# Patient Record
Sex: Male | Born: 1950 | Race: White | Hispanic: No | Marital: Married | Smoking: Former smoker
Health system: Southern US, Community
[De-identification: ages and names within clinical notes are randomized; demographics above are authoritative.]

## PROBLEM LIST (undated history)

## (undated) DIAGNOSIS — C801 Malignant (primary) neoplasm, unspecified: Secondary | ICD-10-CM

## (undated) DIAGNOSIS — I10 Essential (primary) hypertension: Secondary | ICD-10-CM

## (undated) DIAGNOSIS — F419 Anxiety disorder, unspecified: Secondary | ICD-10-CM

## (undated) DIAGNOSIS — F32A Depression, unspecified: Secondary | ICD-10-CM

## (undated) DIAGNOSIS — F329 Major depressive disorder, single episode, unspecified: Secondary | ICD-10-CM

## (undated) DIAGNOSIS — N189 Chronic kidney disease, unspecified: Secondary | ICD-10-CM

## (undated) HISTORY — DX: Depression, unspecified: F32.A

## (undated) HISTORY — DX: Anxiety disorder, unspecified: F41.9

## (undated) HISTORY — DX: Malignant (primary) neoplasm, unspecified: C80.1

## (undated) HISTORY — DX: Chronic kidney disease, unspecified: N18.9

## (undated) HISTORY — PX: POLYPECTOMY: SHX149

## (undated) HISTORY — PX: BLADDER SURGERY: SHX569

## (undated) HISTORY — DX: Essential (primary) hypertension: I10

## (undated) HISTORY — DX: Major depressive disorder, single episode, unspecified: F32.9

## (undated) HISTORY — PX: COLONOSCOPY: SHX174

---

## 1997-06-30 ENCOUNTER — Emergency Department (HOSPITAL_COMMUNITY): Admission: EM | Admit: 1997-06-30 | Discharge: 1997-06-30 | Payer: Self-pay | Admitting: Emergency Medicine

## 2002-06-28 ENCOUNTER — Encounter: Payer: Self-pay | Admitting: Pulmonary Disease

## 2002-06-28 ENCOUNTER — Ambulatory Visit (HOSPITAL_COMMUNITY): Admission: RE | Admit: 2002-06-28 | Discharge: 2002-06-28 | Payer: Self-pay | Admitting: Pulmonary Disease

## 2005-04-02 ENCOUNTER — Ambulatory Visit (HOSPITAL_BASED_OUTPATIENT_CLINIC_OR_DEPARTMENT_OTHER): Admission: RE | Admit: 2005-04-02 | Discharge: 2005-04-02 | Payer: Self-pay | Admitting: Urology

## 2005-04-02 ENCOUNTER — Encounter (INDEPENDENT_AMBULATORY_CARE_PROVIDER_SITE_OTHER): Payer: Self-pay | Admitting: Specialist

## 2008-08-03 ENCOUNTER — Emergency Department (HOSPITAL_COMMUNITY): Admission: EM | Admit: 2008-08-03 | Discharge: 2008-08-03 | Payer: Self-pay | Admitting: Emergency Medicine

## 2008-08-16 ENCOUNTER — Encounter: Admission: RE | Admit: 2008-08-16 | Discharge: 2008-08-16 | Payer: Self-pay | Admitting: Internal Medicine

## 2010-06-30 LAB — POCT I-STAT, CHEM 8
BUN: 18 mg/dL (ref 6–23)
Creatinine, Ser: 1.6 mg/dL — ABNORMAL HIGH (ref 0.4–1.5)
Sodium: 143 mEq/L (ref 135–145)
TCO2: 27 mmol/L (ref 0–100)

## 2010-08-07 NOTE — Op Note (Signed)
NAME:  Christopher [name], Christopher [name] NO.:  [id number]   MEDICAL RECORD NO.:  [id number]          PATIENT TYPE:  AMB   LOCATION:  [facility]                         FACILITY:  [facility]   PHYSICIAN:  Ronald L. Rosana Hoes, M.D.  DATE OF BIRTH:  [birth year]   DATE OF PROCEDURE:  04/02/2005  DATE OF DISCHARGE:                                 OPERATIVE REPORT   DIAGNOSIS:  Possible bladder tumor.   OPERATIVE PROCEDURE:  Cystourethroscopy, cold cup removal of bladder lesion.   SURGEON:  Duane Lope. Rosana Hoes, M.D.   ANESTHESIA:  LMA.   ESTIMATED BLOOD LOSS:  Negligible.   TUBES:  None.   COMPLICATIONS:  None.   INDICATIONS FOR PROCEDURE:  [name] is a very nice 61 year old white male  who essentially presented with gross hematuria.  He underwent a fairly  thorough workup.  His NMP22 was negative.  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   After understanding risks, benefits and alternatives, he has elected to  proceed with cysto cold cup removal.   PROCEDURE IN DETAIL:  Patient was placed in the supine position after proper  LMA anesthesia.  He was placed in the dorsal lithotomy position and prepped  and draped with Betadine in a sterile fashion.  A cystourethroscopy was  performed with a 22.5 [place] Olympus pan endoscope.  Utilizing the 12 and 70  degrees, the bladder was carefully inspected.  There was very mild trilobar  hypertrophy.  The bladder was smooth-walled.  Efflux of clear urine was  noted from the normally placed ureteral orifices bilaterally; however,  approximately 0.5 cm proximal and 0.5 cm lateral to  the right ureteral  orifice was an exophytic, pedunculated lesion, approximately 3 mm in  diameter that needed removal.  Utilizing the cold cup biopsy instrument, the  lesion was removed in its entirety, and the base was  cauterized with Bugbee coagulation cautery.  The specimen was submitted to  pathology.  Reinspection revealed no other lesions.  There was good  hemostasis.  The bladder was drained.  The pan endoscope was removed.  The  patient was taken to the recovery room stable.      [place] Rosana Hoes, M.D.  Electronically Signed     RLD/MEDQ  D:  04/02/2005  T:  04/02/2005  Job:  [id number]

## 2011-08-16 ENCOUNTER — Other Ambulatory Visit: Payer: Self-pay | Admitting: Internal Medicine

## 2011-09-23 ENCOUNTER — Other Ambulatory Visit: Payer: Self-pay | Admitting: Physician Assistant

## 2011-11-21 ENCOUNTER — Ambulatory Visit (INDEPENDENT_AMBULATORY_CARE_PROVIDER_SITE_OTHER): Payer: BC Managed Care – PPO | Admitting: Internal Medicine

## 2011-11-21 VITALS — BP 144/82 | HR 77 | Temp 98.2°F | Resp 16 | Ht 69.58 in | Wt 225.8 lb

## 2011-11-21 DIAGNOSIS — R4589 Other symptoms and signs involving emotional state: Secondary | ICD-10-CM

## 2011-11-21 DIAGNOSIS — I10 Essential (primary) hypertension: Secondary | ICD-10-CM | POA: Insufficient documentation

## 2011-11-21 DIAGNOSIS — R454 Irritability and anger: Secondary | ICD-10-CM | POA: Insufficient documentation

## 2011-11-21 LAB — POCT CBC
Hemoglobin: 15.4 g/dL (ref 14.1–18.1)
Lymph, poc: 1.4 (ref 0.6–3.4)
MCHC: 30.6 g/dL — AB (ref 31.8–35.4)
MID (cbc): 0.5 (ref 0–0.9)
MPV: 9.6 fL (ref 0–99.8)
POC Granulocyte: 4.7 (ref 2–6.9)
POC LYMPH PERCENT: 20.6 %L (ref 10–50)
POC MID %: 8.3 %M (ref 0–12)
Platelet Count, POC: 332 10*3/uL (ref 142–424)
RDW, POC: 15.1 %

## 2011-11-21 LAB — LIPID PANEL
Total CHOL/HDL Ratio: 2.7 Ratio
VLDL: 22 mg/dL (ref 0–40)

## 2011-11-21 LAB — COMPREHENSIVE METABOLIC PANEL
ALT: 19 U/L (ref 0–53)
AST: 17 U/L (ref 0–37)
Alkaline Phosphatase: 79 U/L (ref 39–117)
Sodium: 139 mEq/L (ref 135–145)
Total Bilirubin: 0.7 mg/dL (ref 0.3–1.2)
Total Protein: 6.8 g/dL (ref 6.0–8.3)

## 2011-11-21 MED ORDER — LISINOPRIL 10 MG PO TABS: 10.0000 mg | ORAL_TABLET | Freq: Every day | ORAL | Status: DC

## 2011-11-21 MED ORDER — PAROXETINE HCL 40 MG PO TABS: 40.0000 mg | ORAL_TABLET | Freq: Every day | ORAL | Status: DC

## 2011-11-21 NOTE — Progress Notes (Signed)
  Subjective:    Patient ID: Christopher [name], male    DOB: Jul 18, 1950, 61 y.o.   MRN: [id number]  [facility] office visit May 2012 Out of medications/missed followup exam Doing well in general although now in trouble again at work with anger management Also irritable with wife  gcschools-[employer] 39/26yo boys Review of Systems 15 pounds weight gain Sedentary behavior after work Excess calories after work [employer] 2-3 times at night occasionally with worry/gets sleepy the afternoon/and no history of apnea No chest pain or palpitations No dyspnea on exertion No edema    Objective:   Physical Exam Blood pressure 144/82 weight 225 pounds HEENT clear Heart regular without murmur Lungs clear Peripheral pulses full/no edema       Assessment & Plan:   1. HTN (hypertension)  POCT CBC, Comprehensive metabolic panel, PSA, Lipid panel  2. Anger    3.  Overweight  Restart medications Meds ordered this encounter  Medications  . PARoxetine (PAXIL) 40 MG tablet    Sig: Take 1 tablet (40 mg total) by mouth daily.    Dispense:  90 tablet    Refill:  3  . lisinopril (PRINIVIL,ZESTRIL) 10 MG tablet    Sig: Take 1 tablet (10 mg total) by mouth daily.    Dispense:  90 tablet    Refill:  3

## 2011-11-22 ENCOUNTER — Encounter: Payer: Self-pay | Admitting: Internal Medicine

## 2012-07-04 ENCOUNTER — Encounter: Payer: Self-pay | Admitting: Gastroenterology

## 2012-12-11 ENCOUNTER — Other Ambulatory Visit: Payer: Self-pay | Admitting: Internal Medicine

## 2013-01-21 ENCOUNTER — Other Ambulatory Visit: Payer: Self-pay | Admitting: Physician Assistant

## 2013-02-06 ENCOUNTER — Ambulatory Visit (INDEPENDENT_AMBULATORY_CARE_PROVIDER_SITE_OTHER): Payer: BC Managed Care – PPO | Admitting: Internal Medicine

## 2013-02-06 VITALS — BP 140/90 | HR 63 | Temp 98.1°F | Resp 16 | Ht 70.0 in | Wt 224.0 lb

## 2013-02-06 DIAGNOSIS — I10 Essential (primary) hypertension: Secondary | ICD-10-CM

## 2013-02-06 DIAGNOSIS — C679 Malignant neoplasm of bladder, unspecified: Secondary | ICD-10-CM | POA: Insufficient documentation

## 2013-02-06 DIAGNOSIS — Z86002 Personal history of in-situ neoplasm of other and unspecified genital organs: Secondary | ICD-10-CM

## 2013-02-06 DIAGNOSIS — R059 Cough, unspecified: Secondary | ICD-10-CM

## 2013-02-06 DIAGNOSIS — Z8546 Personal history of malignant neoplasm of prostate: Secondary | ICD-10-CM

## 2013-02-06 DIAGNOSIS — R4589 Other symptoms and signs involving emotional state: Secondary | ICD-10-CM

## 2013-02-06 DIAGNOSIS — R454 Irritability and anger: Secondary | ICD-10-CM

## 2013-02-06 DIAGNOSIS — R05 Cough: Secondary | ICD-10-CM

## 2013-02-06 DIAGNOSIS — A048 Other specified bacterial intestinal infections: Secondary | ICD-10-CM

## 2013-02-06 LAB — POCT CBC
Granulocyte percent: 73.5 %G (ref 37–80)
HCT, POC: 47 % (ref 43.5–53.7)
Hemoglobin: 15.1 g/dL (ref 14.1–18.1)
MCV: 89.7 fL (ref 80–97)
POC LYMPH PERCENT: 18.7 %L (ref 10–50)
RDW, POC: 14.6 %

## 2013-02-06 LAB — COMPREHENSIVE METABOLIC PANEL
ALT: 16 U/L (ref 0–53)
Alkaline Phosphatase: 85 U/L (ref 39–117)
Creat: 1.32 mg/dL (ref 0.50–1.35)
Sodium: 140 mEq/L (ref 135–145)
Total Bilirubin: 0.5 mg/dL (ref 0.3–1.2)
Total Protein: 6.9 g/dL (ref 6.0–8.3)

## 2013-02-06 LAB — LIPID PANEL
LDL Cholesterol: 80 mg/dL (ref 0–99)
Total CHOL/HDL Ratio: 3.2 Ratio
Triglycerides: 150 mg/dL — ABNORMAL HIGH (ref <150)
VLDL: 30 mg/dL (ref 0–40)

## 2013-02-06 LAB — POCT UA - MICROSCOPIC ONLY
Crystals, Ur, HPF, POC: NEGATIVE
Mucus, UA: NEGATIVE
RBC, urine, microscopic: NEGATIVE
WBC, Ur, HPF, POC: NEGATIVE

## 2013-02-06 LAB — POCT URINALYSIS DIPSTICK
Bilirubin, UA: NEGATIVE
Blood, UA: NEGATIVE
Ketones, UA: NEGATIVE
Protein, UA: 100
Spec Grav, UA: >=1.03
pH, UA: 5

## 2013-02-06 MED ORDER — PAROXETINE HCL 40 MG PO TABS: 40.0000 mg | ORAL_TABLET | ORAL | Status: DC

## 2013-02-06 MED ORDER — LISINOPRIL 10 MG PO TABS: 10.0000 mg | ORAL_TABLET | Freq: Every day | ORAL | Status: DC

## 2013-02-06 NOTE — Progress Notes (Addendum)
This chart was scribed for Christopher Lin, MD, by Neta Ehlers, ED Scribe. This pt's care was started at 12:52 PM.  Subjective:    Patient ID: Christopher [name], male    DOB: Nov 21, 1950, 62 y.o.   MRN: [id number]  Chief Complaint  Patient presents with   Medication Refill    Lisinopril, Paxil   URI    x 3 weeks  came as walkin overdue for F/U---see last OV and letter 11/2011 HPI  HPI Comments: Christopher [name] is a 62 y.o. male who presents to the Emergency Department complaining of a URI which he has had for approximately two weeks and is associated with a cough and congestion. He has used OTC to relieve his symptoms with some resolution.   He denies any swelling to his lower extremities. The pt states that he has varicose veins, but he denies the need for surgery.  The pt requests a refill of lisinopril; his BP in the office was 140/90. He also requests a refill of Paxil; he has been out of Paxil for two weeks. He feels that the dosage of Paxil he was taking was appropriate and that he felt improvement to his anger with the dosage. The pt reports that, since being out of Paxil,  he has had increased issues with anger, stating that he "almost lost it" yesterday evening when his wife and two adult sons were "picking" on him.   He has had sleep disturbances due to experiencing strange dreams and frequency several times a night since out of paxil. He has also experienced mild diaphoresis the past two days and an episode of crying this morning.   The pt has not had a follow-up concerning his Bladder ca since his doctor moved to [facility].    He normally takes a supplemental vitamin and fish oil.   Patient Active Problem List   Diagnosis Date Noted   Transitional cell carcinoma of bladder 2007---lost to f/u when Dr Rosana Hoes moved 02/06/2013   H. pylori infection-treated 2010--asympto now 02/06/2013   HTN (hypertension) 11/21/2011   Anger 11/21/2011     Patient Active  Problem List   Diagnosis Date Noted   HTN (hypertension) 11/21/2011   Anger 11/21/2011   History reviewed. No pertinent past surgical history.except bladder '06  No Known Allergies Due for colonos-Dr Patterson--he'll call  Review of Systems  Constitutional: Positive for diaphoresis.  HENT: Positive for congestion. Negative for sneezing and sore throat.   Respiratory: Positive for cough. Negative for chest tightness, shortness of breath and wheezing.   Cardiovascular: Negative for chest pain, palpitations and leg swelling.  Gastrointestinal: Negative for abdominal pain.  Genitourinary: Negative for hematuria.       Nocturia x 1 or 2  Neurological: Negative for headaches.  Psychiatric/Behavioral: Positive for behavioral problems and sleep disturbance.       Objective:   Physical Exam  Nursing note and vitals reviewed. Constitutional: He is oriented to person, place, and time. He appears well-developed and well-nourished. No distress.  HENT:  Head: Normocephalic and atraumatic.  Right Ear: External ear normal.  Left Ear: External ear normal.  Nose: Nose normal.  Mouth/Throat: Oropharynx is clear and moist.  Eyes: Conjunctivae and EOM are normal. Pupils are equal, round, and reactive to light.  Neck: Neck supple. No tracheal deviation present. No thyromegaly present.  Cardiovascular: Normal rate, regular rhythm and normal heart sounds.   No murmur heard. Pulmonary/Chest: Effort normal and breath sounds normal. No respiratory distress. He has no wheezes.  He exhibits no tenderness.  Musculoskeletal: Normal range of motion. He exhibits no edema.  Lymphadenopathy:    He has no cervical adenopathy.  Neurological: He is alert and oriented to person, place, and time. No cranial nerve deficit.  Skin: Skin is warm and dry.  Psychiatric: He has a normal mood and affect. His behavior is normal. Judgment and thought content normal.    Triage Vitals: BP 140/90   Pulse 63   Temp(Src)  98.1 F (36.7 C) (Oral)   Resp 16   Ht 5\' 10"  (1.778 m)   Wt 224 lb (101.606 kg)   BMI 32.14 kg/m2   SpO2 98%    Results for orders placed in visit on 02/06/13  POCT CBC      Result Value Range   WBC 8.5  4.6 - 10.2 K/uL   Lymph, poc 1.6  0.6 - 3.4   POC LYMPH PERCENT 18.7  10 - 50 %L   MID (cbc) 0.7  0 - 0.9   POC MID % 7.8  0 - 12 %M   POC Granulocyte 6.2  2 - 6.9   Granulocyte percent 73.5  37 - 80 %G   RBC 5.24  4.69 - 6.13 M/uL   Hemoglobin 15.1  14.1 - 18.1 g/dL   HCT, POC 47.0  43.5 - 53.7 %   MCV 89.7  80 - 97 fL   MCH, POC 28.8  27 - 31.2 pg   MCHC 32.1  31.8 - 35.4 g/dL   RDW, POC 14.6     Platelet Count, POC 308  142 - 424 K/uL   MPV 8.6  0 - 99.8 fL  POCT UA - MICROSCOPIC ONLY      Result Value Range   WBC, Ur, HPF, POC neg     RBC, urine, microscopic neg     Bacteria, U Microscopic Trace     Mucus, UA Neg     Epithelial cells, urine per micros 0-2     Crystals, Ur, HPF, POC neg     Casts, Ur, LPF, POC neg     Yeast, UA neg    POCT URINALYSIS DIPSTICK      Result Value Range   Color, UA yellow     Clarity, UA clear     Glucose, UA neg     Bilirubin, UA neg     Ketones, UA neg     Spec Grav, UA >=1.030     Blood, UA neg     pH, UA 5.0     Protein, UA 100     Urobilinogen, UA 0.2     Nitrite, UA neg     Leukocytes, UA Negative      Assessment & Plan:  HTN (hypertension) - Plan: lisinopril (PRINIVIL,ZESTRIL) 10 MG tablet refill  Cr and K were up 9/13 w/out f/u  Anger Management Disorder - Plan: PARoxetine (PAXIL) 40 MG tablet refill,   Cough--resolving viral uri  H. pylori infection-treated 2010/to sched colonos  Bladder cancer - Plan: POCT UA - Microscopic Only, POCT urinalysis dipstick/[facility]  Meds ordered this encounter  Medications   lisinopril (PRINIVIL,ZESTRIL) 10 MG tablet    Sig: Take 1 tablet (10 mg total) by mouth daily.    Dispense:  90 tablet    Refill:  3   PARoxetine (PAXIL) 40 MG tablet    Sig: Take 1  tablet (40 mg total) by mouth every morning.    Dispense:  90 tablet  Refill:  3

## 2013-02-07 ENCOUNTER — Encounter: Payer: Self-pay | Admitting: Internal Medicine

## 2013-02-07 LAB — PSA: PSA: 1.89 ng/mL (ref <=4.00)

## 2013-04-18 ENCOUNTER — Encounter: Payer: Self-pay | Admitting: Gastroenterology

## 2013-11-09 ENCOUNTER — Other Ambulatory Visit: Payer: Self-pay | Admitting: Internal Medicine

## 2014-02-03 ENCOUNTER — Other Ambulatory Visit: Payer: Self-pay | Admitting: Internal Medicine

## 2014-03-22 HISTORY — PX: JOINT REPLACEMENT: SHX530

## 2014-05-15 ENCOUNTER — Ambulatory Visit (INDEPENDENT_AMBULATORY_CARE_PROVIDER_SITE_OTHER): Payer: BC Managed Care – PPO

## 2014-05-15 ENCOUNTER — Ambulatory Visit (INDEPENDENT_AMBULATORY_CARE_PROVIDER_SITE_OTHER): Payer: BC Managed Care – PPO | Admitting: Emergency Medicine

## 2014-05-15 VITALS — BP 124/78 | HR 58 | Temp 97.7°F | Resp 18 | Ht 70.5 in | Wt 221.0 lb

## 2014-05-15 DIAGNOSIS — M25561 Pain in right knee: Secondary | ICD-10-CM

## 2014-05-15 DIAGNOSIS — C679 Malignant neoplasm of bladder, unspecified: Secondary | ICD-10-CM

## 2014-05-15 DIAGNOSIS — M25562 Pain in left knee: Secondary | ICD-10-CM

## 2014-05-15 DIAGNOSIS — F32A Depression, unspecified: Secondary | ICD-10-CM

## 2014-05-15 DIAGNOSIS — I10 Essential (primary) hypertension: Secondary | ICD-10-CM

## 2014-05-15 DIAGNOSIS — F329 Major depressive disorder, single episode, unspecified: Secondary | ICD-10-CM

## 2014-05-15 DIAGNOSIS — Z1211 Encounter for screening for malignant neoplasm of colon: Secondary | ICD-10-CM

## 2014-05-15 LAB — BASIC METABOLIC PANEL WITH GFR
BUN: 24 mg/dL — AB (ref 6–23)
CALCIUM: 10.1 mg/dL (ref 8.4–10.5)
CO2: 27 mEq/L (ref 19–32)
Chloride: 104 mEq/L (ref 96–112)
Creat: 1.51 mg/dL — ABNORMAL HIGH (ref 0.50–1.35)
GFR, EST AFRICAN AMERICAN: 56 mL/min — AB
GFR, EST NON AFRICAN AMERICAN: 48 mL/min — AB
GLUCOSE: 90 mg/dL (ref 70–99)
POTASSIUM: 5.5 meq/L — AB (ref 3.5–5.3)
SODIUM: 139 meq/L (ref 135–145)

## 2014-05-15 LAB — POCT CBC
GRANULOCYTE PERCENT: 71.5 % (ref 37–80)
HCT, POC: 46.9 % (ref 43.5–53.7)
HEMOGLOBIN: 15.5 g/dL (ref 14.1–18.1)
Lymph, poc: 1.5 (ref 0.6–3.4)
MCH: 29 pg (ref 27–31.2)
MCHC: 33 g/dL (ref 31.8–35.4)
MCV: 87.9 fL (ref 80–97)
MID (cbc): 0.7 (ref 0–0.9)
MPV: 7.4 fL (ref 0–99.8)
POC Granulocyte: 5.6 (ref 2–6.9)
POC LYMPH PERCENT: 19.5 %L (ref 10–50)
POC MID %: 9 % (ref 0–12)
Platelet Count, POC: 273 10*3/uL (ref 142–424)
RBC: 5.33 M/uL (ref 4.69–6.13)
RDW, POC: 14.2 %
WBC: 7.9 10*3/uL (ref 4.6–10.2)

## 2014-05-15 MED ORDER — LISINOPRIL 10 MG PO TABS: ORAL_TABLET | ORAL | Status: DC

## 2014-05-15 MED ORDER — PAROXETINE HCL 40 MG PO TABS: ORAL_TABLET | ORAL | Status: DC

## 2014-05-15 NOTE — Progress Notes (Signed)
   This chart was scribed for Darlyne Russian, MD by Edison Simon, ED Scribe. This patient was seen in room 14 and the patient's care was started at 9:34 AM.   Subjective:    Patient ID: Christopher [name], male    DOB: 01-Oct-1950, 64 y.o.   MRN: [id number]  HPI  HPI Comments: Christopher [name] is a 64 y.o. male who presents to the [facility] complaining of right knee pain, recurring 2 days ago, and for medication refill. He states he had x-ray taken of it by Dr. Laney Pastor 5-6 years ago that showed some arthritis but states no treatment was pursued at that time. He states he has seen an orthopedist for it. 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  He states Praxil works well. He states he tried to get off it, but feels he needs it.   Review of Systems  All other systems reviewed and are negative.     Objective:   Physical Exam  Vitals reviewed.   CONSTITUTIONAL: Well developed/well nourished HEAD: Normocephalic/atraumatic EYES: EOMI/PERRL ENMT: Mucous membranes moist NECK: supple no meningeal signs SPINE/BACK:entire spine nontender CV: S1/S2 noted, no murmurs/rubs/gallops noted LUNGS: Lungs are clear to auscultation bilaterally, no apparent distress ABDOMEN: soft, nontender, no rebound or guarding, bowel sounds noted throughout abdomen GU:no cva tenderness NEURO: Pt is awake/alert/appropriate, moves all extremitiesx4.  No facial droop.   EXTREMITIES: pulses normal/equal, full ROM, Significant defeneration disease of right knee with crepitation on flexion and extension SKIN: warm, color normal PSYCH: no abnormalities of mood noted, alert and oriented to situation   [facility] (PRIMARY) x-ray report read by Dr. Everlene Farrier. X-ray of right knee reveals loss of joint space medially, lateral compartment looks normal.    Assessment & Plan:   Meds were refilled. Follow-up made with the urologist regarding his  bladder cancer. Routine labs were done. He has not been having yearly physicals and I encouraged him to do that. I personally performed the services described in this documentation, which was scribed in my presence. The recorded information has been reviewed and is accurate.

## 2014-05-16 ENCOUNTER — Other Ambulatory Visit: Payer: Self-pay | Admitting: Family Medicine

## 2014-05-16 DIAGNOSIS — I10 Essential (primary) hypertension: Secondary | ICD-10-CM

## 2014-05-16 MED ORDER — AMLODIPINE BESYLATE 10 MG PO TABS: 10.0000 mg | ORAL_TABLET | Freq: Every day | ORAL | Status: DC

## 2014-05-16 MED ORDER — AMLODIPINE BESYLATE 5 MG PO TABS: 5.0000 mg | ORAL_TABLET | Freq: Every day | ORAL | Status: DC

## 2014-05-17 ENCOUNTER — Encounter: Payer: Self-pay | Admitting: Internal Medicine

## 2014-06-12 ENCOUNTER — Encounter: Payer: Self-pay | Admitting: Internal Medicine

## 2014-06-13 ENCOUNTER — Encounter: Payer: Self-pay | Admitting: Internal Medicine

## 2014-07-10 ENCOUNTER — Encounter: Payer: Self-pay | Admitting: Internal Medicine

## 2014-07-18 ENCOUNTER — Ambulatory Visit (AMBULATORY_SURGERY_CENTER): Payer: Self-pay | Admitting: *Deleted

## 2014-07-18 VITALS — Ht 71.0 in | Wt 226.4 lb

## 2014-07-18 DIAGNOSIS — Z1211 Encounter for screening for malignant neoplasm of colon: Secondary | ICD-10-CM

## 2014-07-18 MED ORDER — MOVIPREP 100 G PO SOLR: 1.0000 | Freq: Once | ORAL | Status: DC

## 2014-07-18 NOTE — Progress Notes (Signed)
Denies allergies to eggs or soy products. Denies complications with sedation or anesthesia. Denies O2 use. Denies use of diet or weight loss medications.  Emmi instructions given for colonoscopy, no computer access.

## 2014-07-19 ENCOUNTER — Encounter: Payer: Self-pay | Admitting: Internal Medicine

## 2014-07-24 ENCOUNTER — Encounter: Payer: Self-pay | Admitting: Internal Medicine

## 2014-08-01 ENCOUNTER — Ambulatory Visit (AMBULATORY_SURGERY_CENTER): Payer: BC Managed Care – PPO | Admitting: Internal Medicine

## 2014-08-01 ENCOUNTER — Other Ambulatory Visit: Payer: Self-pay | Admitting: Internal Medicine

## 2014-08-01 ENCOUNTER — Encounter: Payer: Self-pay | Admitting: Internal Medicine

## 2014-08-01 VITALS — BP 132/85 | HR 51 | Temp 98.1°F | Resp 21 | Ht 71.0 in | Wt 226.0 lb

## 2014-08-01 DIAGNOSIS — D123 Benign neoplasm of transverse colon: Secondary | ICD-10-CM | POA: Diagnosis not present

## 2014-08-01 DIAGNOSIS — Z1211 Encounter for screening for malignant neoplasm of colon: Secondary | ICD-10-CM | POA: Diagnosis present

## 2014-08-01 MED ORDER — SODIUM CHLORIDE 0.9 % IV SOLN: 500.0000 mL | INTRAVENOUS | Status: DC

## 2014-08-01 NOTE — Progress Notes (Signed)
Patient awakening,vss,report to rn 

## 2014-08-01 NOTE — Progress Notes (Signed)
Called to room to assist during endoscopic procedure.  Patient ID and intended procedure confirmed with present staff. Received instructions for my participation in the procedure from the performing physician.  

## 2014-08-01 NOTE — Patient Instructions (Signed)
Impressions/recommendations:  Polyp (handout given)  Repeat colonoscopy pending pathology results.  YOU HAD AN ENDOSCOPIC PROCEDURE TODAY AT [facility]:   Refer to the procedure report that was given to you for any specific questions about what was found during the examination.  If the procedure report does not answer your questions, please call your gastroenterologist to clarify.  If you requested that your care partner not be given the details of your procedure findings, then the procedure report has been included in a sealed envelope for you to review at your convenience later.  YOU SHOULD EXPECT: Some feelings of bloating in the abdomen. Passage of more gas than usual.  Walking can help get rid of the air that was put into your GI tract during the procedure and reduce the bloating. If you had a lower endoscopy (such as a colonoscopy or flexible sigmoidoscopy) you may notice spotting of blood in your stool or on the toilet paper. If you underwent a bowel prep for your procedure, you may not have a normal bowel movement for a few days.  Please Note:  You might notice some irritation and congestion in your nose or some drainage.  This is from the oxygen used during your procedure.  There is no need for concern and it should clear up in a day or so.  SYMPTOMS TO REPORT IMMEDIATELY:   Following lower endoscopy (colonoscopy or flexible sigmoidoscopy):  Excessive amounts of blood in the stool  Significant tenderness or worsening of abdominal pains  Swelling of the abdomen that is new, acute  Fever of 100F or higher   For urgent or emergent issues, a gastroenterologist can be reached at any hour by calling [phone].   DIET: Your first meal following the procedure should be a small meal and then it is ok to progress to your normal diet. Heavy or fried foods are harder to digest and may make you feel nauseous or bloated.  Likewise, meals heavy in dairy and vegetables can  increase bloating.  Drink plenty of fluids but you should avoid alcoholic beverages for 24 hours.  ACTIVITY:  You should plan to take it easy for the rest of today and you should NOT DRIVE or use heavy machinery until tomorrow (because of the sedation medicines used during the test).    FOLLOW UP: Our staff will call the number listed on your records the next business day following your procedure to check on you and address any questions or concerns that you may have regarding the information given to you following your procedure. If we do not reach you, we will leave a message.  However, if you are feeling well and you are not experiencing any problems, there is no need to return our call.  We will assume that you have returned to your regular daily activities without incident.  If any biopsies were taken you will be contacted by phone or by letter within the next 1-3 weeks.  Please call us at [phone] if you have not heard about the biopsies in 3 weeks.    SIGNATURES/CONFIDENTIALITY: You and/or your care partner have signed paperwork which will be entered into your electronic medical record.  These signatures attest to the fact that that the information above on your After Visit Summary has been reviewed and is understood.  Full responsibility of the confidentiality of this discharge information lies with you and/or your care-partner.

## 2014-08-01 NOTE — Op Note (Signed)
[place]  [address] [place], [id number]   COLONOSCOPY PROCEDURE REPORT  PATIENT: Christopher [name], Christopher [name]  MR#: [id number] BIRTHDATE: 06-27-1950 , 73  yrs. old GENDER: male ENDOSCOPIST: Eustace Quail, MD REFERRED [id number] Laney Pastor, M.D. PROCEDURE DATE:  08/01/2014 PROCEDURE:   Colonoscopy, screening and Colonoscopy with snare polypectomy x 1 First Screening Colonoscopy - Avg.  risk and is 50 yrs.  old or older Yes.  Prior Negative Screening - Now for repeat screening. N/A  History of Adenoma - Now for follow-up colonoscopy & has been > or = to 3 yrs.  N/A  Polyps removed today = YES ASA CLASS:   Class II INDICATIONS:Screening for colonic neoplasia and Colorectal Neoplasm Risk Assessment for this procedure is average risk. Previous colonoscopy May 2004 with Dr. Sharlett Iles (negative). MEDICATIONS: Monitored anesthesia care and Propofol 400 mg IV  DESCRIPTION OF PROCEDURE:   After the risks benefits and alternatives of the procedure were thoroughly explained, informed consent was obtained.  The digital rectal exam revealed no abnormalities of the rectum.   The [id number]  endoscope was introduced through the anus and advanced to the cecum, which was identified by both the appendix and ileocecal valve. No adverse events experienced.   The quality of the prep was good.  (MoviPrep was used)  The instrument was then slowly withdrawn as the colon was fully examined.     COLON FINDINGS: A single polyp measuring 5 mm in size was found in the transverse colon.  A polypectomy was performed with a cold snare.  The resection was complete, the polyp tissue was completely retrieved and sent to histology.   The examination was otherwise normal.  Retroflexed views revealed internal hemorrhoids. The time to cecum = 3.1 Withdrawal time = 11.8   The scope was withdrawn and the procedure completed. COMPLICATIONS: There were no immediate complications.  ENDOSCOPIC  IMPRESSION: 1.   Single polyp was found in the transverse colon; polypectomy was performed with a cold snare 2.   The examination was otherwise normal  RECOMMENDATIONS: 1. Repeat colonoscopy in 5 years if polyp adenomatous; otherwise 10 years  eSigned:  Eustace Quail, MD 08/01/2014 1:53 PM   cc: [name], MD and The Patient

## 2014-08-02 ENCOUNTER — Telehealth: Payer: Self-pay | Admitting: *Deleted

## 2014-08-02 NOTE — Telephone Encounter (Signed)
  Follow up Call-  Call back number 08/01/2014  Post procedure Call Back phone  # [phone]  Permission to leave phone message Yes     Patient questions:  Do you have a fever, pain , or abdominal swelling? No. Pain Score  0 *  Have you tolerated food without any problems? Yes.    Have you been able to return to your normal activities? Yes.    Do you have any questions about your discharge instructions: Diet   No. Medications  No. Follow up visit  No.  Do you have questions or concerns about your Care? No.  Actions: * If pain score is 4 or above: No action needed, pain <4.

## 2014-08-06 ENCOUNTER — Encounter: Payer: Self-pay | Admitting: Internal Medicine

## 2014-08-26 ENCOUNTER — Other Ambulatory Visit: Payer: Self-pay | Admitting: Emergency Medicine

## 2014-08-27 ENCOUNTER — Telehealth: Payer: Self-pay

## 2014-08-27 NOTE — Telephone Encounter (Signed)
[facility] requesting most recent OV notes, labs, and cpe in the last 6 months to be faxed to [phone] for patients surgery. Stated she would fax over a release as well.  Most recent OV notes were from 05/15/14 everything else was from 2014.  Faxed over through [facility] on 08/27/14

## 2014-12-23 ENCOUNTER — Ambulatory Visit (INDEPENDENT_AMBULATORY_CARE_PROVIDER_SITE_OTHER): Payer: BC Managed Care – PPO | Admitting: Internal Medicine

## 2014-12-23 VITALS — BP 140/86 | HR 62 | Temp 98.3°F | Resp 16 | Ht 71.0 in | Wt 235.2 lb

## 2014-12-23 DIAGNOSIS — R351 Nocturia: Secondary | ICD-10-CM

## 2014-12-23 DIAGNOSIS — R600 Localized edema: Secondary | ICD-10-CM | POA: Diagnosis not present

## 2014-12-23 DIAGNOSIS — I10 Essential (primary) hypertension: Secondary | ICD-10-CM | POA: Diagnosis not present

## 2014-12-23 DIAGNOSIS — F32A Depression, unspecified: Secondary | ICD-10-CM

## 2014-12-23 DIAGNOSIS — F329 Major depressive disorder, single episode, unspecified: Secondary | ICD-10-CM | POA: Diagnosis not present

## 2014-12-23 MED ORDER — FUROSEMIDE 20 MG PO TABS: 20.0000 mg | ORAL_TABLET | Freq: Every day | ORAL | Status: DC

## 2014-12-23 MED ORDER — LISINOPRIL 10 MG PO TABS: ORAL_TABLET | ORAL | Status: DC

## 2014-12-23 MED ORDER — PAROXETINE HCL 40 MG PO TABS: 40.0000 mg | ORAL_TABLET | Freq: Every day | ORAL | Status: DC

## 2014-12-23 MED ORDER — PAROXETINE HCL 20 MG PO TABS: 40.0000 mg | ORAL_TABLET | Freq: Every day | ORAL | Status: DC

## 2014-12-23 NOTE — Progress Notes (Signed)
Subjective:    Patient ID: Christopher [name], male    DOB: May 25, 1950, 64 y.o.   MRN: [id number] This chart was scribed for [name], MD by Marti Sleigh, Medical Scribe. This patient was seen in Room 9 and the patient's care was started a 7:13 PM.  Chief Complaint  Patient presents with  . Leg Swelling    both legs, since his recent surgery back in june 8  . discuss medication    last visit he was taking off some medication and wants to know why   HPI HPI Comments: Christopher [name] is a 64 y.o. male who presents to [facility] reporting for a medication refill. He had knee surgery June 8th, but states his knee pain is worse now than it hurt before he had the surgery. He states his leg has been swelling. He states he has constant swelling in his bilateral legs.He states he gets SOB if he works hard, but not with his customary activity. No chest pain or palpitations. No diaphoresis. No weakness. This swelling has been intermittent since his last visit in February 2016 when he was changed from lisinopril to Norvasc.   He has had problems with anger management again since his Paxil was mistakenly discontinued in February 2016. He is not sure why this has happened for reason but he is certainly not taking it now and his wife has noticed a personality change.  He has had his colonoscopy in the last year. Marland Kitchen  He states he gets up 3-4 times per night to urinate. His urologist checked his bladder recently and told him his bladder looked good. He is status post treatment for bladder cancer.  He states he is not sleeping well at night, due to knee pain and frequent urination.   He also states that he has occasional blood in his stools thought secondary to hemorrhoids.   He is status post knee surgery on the right and is just resume work 1 month ago, but he is still having great difficulty with pain. He can no longer afford physical therapy.  Review of Systems  Constitutional: Negative for  fever, chills and unexpected weight change.  HENT: Negative for trouble swallowing.   Eyes: Negative for visual disturbance.  Respiratory: Positive for shortness of breath. Negative for cough.   Cardiovascular: Negative for chest pain and palpitations.  Gastrointestinal: Negative for abdominal pain, diarrhea and constipation.  Genitourinary: Positive for frequency. Negative for dysuria, urgency, hematuria and testicular pain.       Nocturia  Musculoskeletal: Positive for joint swelling and gait problem.  Neurological: Negative for headaches.  Psychiatric/Behavioral: Positive for sleep disturbance. Hyperactive: .rpds.       Objective:   Physical Exam  Constitutional: He is oriented to person, place, and time. He appears well-developed and well-nourished. No distress.  HENT:  Head: Normocephalic and atraumatic.  Eyes: Pupils are equal, round, and reactive to light.  Neck: Neck supple.  Cardiovascular: Normal rate, regular rhythm and normal heart sounds.   No murmur heard. 3+ pitting edema in bilateral lower extremities. Good distal pulses  Pulmonary/Chest: Effort normal and breath sounds normal. No respiratory distress. He has no wheezes.  Musculoskeletal: Normal range of motion. He exhibits edema.  Neurological: He is alert and oriented to person, place, and time. Coordination normal.  Skin: Skin is warm and dry. He is not diaphoretic.  Psychiatric: He has a normal mood and affect. His behavior is normal.  Nursing note and vitals reviewed.   Filed  Vitals:   12/23/14 1753  BP: 140/86  Pulse: 62  Temp: 98.3 F (36.8 C)  TempSrc: Oral  Resp: 16  Height: 5\' 11"  (1.803 m)  Weight: 235 lb 3.2 oz (106.686 kg)  SpO2: 98%      Assessment & Plan:  Essential hypertension - Plan: CBC with Differential/Platelet, Comprehensive metabolic panel, Lipid panel -Discontinue Norvasc and resume lisinopril Depression/agitation with anger management difficulties -Restart Paxil Edema of both  legs -Lasix for 5 days Nocturia - Plan: PSA -If normal will consider Flomax  Routine labs drawn  Meds ordered this encounter  Medications  . lisinopril (PRINIVIL,ZESTRIL) 10 MG tablet    Sig: One tablet daily    Dispense:  90 tablet    Refill:  3  . furosemide (LASIX) 20 MG tablet    Sig: Take 1 tablet (20 mg total) by mouth daily. Once a day for 5days    Dispense:  5 tablet    Refill:  3  . PARoxetine (PAXIL) 20 MG tablet    Sig: Take 2 tablets (40 mg total) by mouth daily.    Dispense:  90 tablet    Refill:  3       By signing my name below, I, Judithe Modest, attest that this documentation has been prepared under the direction and in the presence of [name], MD. Electronically Signed: Judithe Modest, ER Scribe. 12/23/2014. 7:13 PM.   I have completed the patient encounter in its entirety as documented by the scribe, with editing by me where necessary. Mischelle Reeg P. Laney Pastor, M.D.

## 2014-12-24 LAB — LIPID PANEL
CHOL/HDL RATIO: 2.9 ratio (ref <=5.0)
CHOLESTEROL: 161 mg/dL (ref 125–200)
HDL: 55 mg/dL (ref >=40)
LDL Cholesterol: 86 mg/dL (ref <130)
Triglycerides: 99 mg/dL (ref <150)
VLDL: 20 mg/dL (ref <30)

## 2014-12-24 LAB — COMPREHENSIVE METABOLIC PANEL
ALT: 17 U/L (ref 9–46)
AST: 20 U/L (ref 10–35)
Albumin: 4.5 g/dL (ref 3.6–5.1)
Alkaline Phosphatase: 89 U/L (ref 40–115)
BUN: 24 mg/dL (ref 7–25)
CALCIUM: 10.1 mg/dL (ref 8.6–10.3)
CHLORIDE: 104 mmol/L (ref 98–110)
CO2: 25 mmol/L (ref 20–31)
Creat: 1.96 mg/dL — ABNORMAL HIGH (ref 0.70–1.25)
Glucose, Bld: 91 mg/dL (ref 65–99)
Potassium: 4.3 mmol/L (ref 3.5–5.3)
Sodium: 139 mmol/L (ref 135–146)
Total Bilirubin: 0.7 mg/dL (ref 0.2–1.2)
Total Protein: 7 g/dL (ref 6.1–8.1)

## 2014-12-24 LAB — CBC WITH DIFFERENTIAL/PLATELET
Basophils Absolute: 0 10*3/uL (ref 0.0–0.1)
Basophils Relative: 0 % (ref 0–1)
EOS PCT: 4 % (ref 0–5)
Eosinophils Absolute: 0.3 10*3/uL (ref 0.0–0.7)
HEMATOCRIT: 43.7 % (ref 39.0–52.0)
Hemoglobin: 14.8 g/dL (ref 13.0–17.0)
LYMPHS ABS: 1.3 10*3/uL (ref 0.7–4.0)
Lymphocytes Relative: 16 % (ref 12–46)
MCH: 28.9 pg (ref 26.0–34.0)
MCHC: 33.9 g/dL (ref 30.0–36.0)
MCV: 85.4 fL (ref 78.0–100.0)
MONO ABS: 0.8 10*3/uL (ref 0.1–1.0)
MPV: 9.9 fL (ref 8.6–12.4)
Monocytes Relative: 10 % (ref 3–12)
Neutro Abs: 5.7 10*3/uL (ref 1.7–7.7)
Neutrophils Relative %: 70 % (ref 43–77)
Platelets: 302 10*3/uL (ref 150–400)
RBC: 5.12 MIL/uL (ref 4.22–5.81)
RDW: 14.5 % (ref 11.5–15.5)
WBC: 8.2 10*3/uL (ref 4.0–10.5)

## 2014-12-25 LAB — PSA: PSA: 2.22 ng/mL (ref <=4.00)

## 2014-12-26 ENCOUNTER — Encounter: Payer: Self-pay | Admitting: Internal Medicine

## 2014-12-26 MED ORDER — TAMSULOSIN HCL 0.4 MG PO CAPS: 0.4000 mg | ORAL_CAPSULE | Freq: Every day | ORAL | Status: DC

## 2015-01-10 ENCOUNTER — Other Ambulatory Visit: Payer: Self-pay | Admitting: Internal Medicine

## 2015-02-07 ENCOUNTER — Ambulatory Visit (INDEPENDENT_AMBULATORY_CARE_PROVIDER_SITE_OTHER): Payer: BC Managed Care – PPO | Admitting: Internal Medicine

## 2015-02-07 VITALS — BP 122/80 | HR 54 | Temp 97.9°F | Resp 16 | Ht 71.0 in | Wt 225.0 lb

## 2015-02-07 DIAGNOSIS — M545 Low back pain, unspecified: Secondary | ICD-10-CM

## 2015-02-07 DIAGNOSIS — M79605 Pain in left leg: Principal | ICD-10-CM

## 2015-02-07 MED ORDER — CYCLOBENZAPRINE HCL 10 MG PO TABS: 10.0000 mg | ORAL_TABLET | Freq: Every day | ORAL | Status: DC

## 2015-02-07 MED ORDER — MELOXICAM 15 MG PO TABS: 15.0000 mg | ORAL_TABLET | Freq: Every day | ORAL | Status: DC

## 2015-02-07 MED ORDER — PREDNISONE 20 MG PO TABS: ORAL_TABLET | ORAL | Status: DC

## 2015-02-07 NOTE — Progress Notes (Signed)
Subjective:   This chart was scribed for Christopher Lin, MD by Thea Alken, ED Scribe. This patient was seen in room 6 and the patient's care was started at 11:10 AM.   Patient ID: Christopher [name], male    DOB: 03/15/1951, 64 y.o.   MRN: [id number]  HPI   Chief Complaint  Patient presents with  . Back Pain    x 1 week   HPI Comments: Christopher [name] is a 64 y.o. male who presents to the [facility] complaining of bilateral back pain that began 4-5 days ago. Pt believes he may have pulled a muscle in his back doing some heavy lifting earlier this week. Has pain with certain position and getting in and out of his car with radiating pain to left leg. He was seen by orthopedist and was prescribed tramadol without relief to pain. He has been unable to sleep at night due to pain. States he is very limited with activity at work and decided he could not go to work today due to [employer].    Patient Active Problem List   Diagnosis Date Noted  . 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 H. pylori infection-treated 2010 02/06/2013  . HTN (hypertension) 11/21/2011  . Anger 11/21/2011    No Known Allergies Prior to Admission medications   Medication Sig Start Date End Date Taking? Authorizing Provider  lisinopril (PRINIVIL,ZESTRIL) 10 MG tablet One tablet daily 12/23/14  Yes Leandrew Koyanagi, MD  PARoxetine (PAXIL) 20 MG tablet Take 2 tablets (40 mg total) by mouth daily. 12/23/14  Yes Leandrew Koyanagi, MD  tamsulosin (FLOMAX) 0.4 MG CAPS capsule Take 1 capsule (0.4 mg total) by mouth daily. With supper. 12/26/14  Yes Leandrew Koyanagi, MD   Review of Systems  Musculoskeletal: Positive for myalgias.   no fever chills or night sweats No urinary difficulty     Objective:   Physical Exam  Constitutional: He is oriented to person, place, and time. He appears well-developed and well-nourished. No distress.  HENT:  Head: Normocephalic and atraumatic.    Eyes: Conjunctivae and EOM are normal.  Neck: Neck supple.  Cardiovascular: Normal rate and intact distal pulses.   Pulmonary/Chest: Effort normal.  Musculoskeletal: Normal range of motion. He exhibits no edema.  He is tender bilaterally in the lumbar area over the muscles but not over the spinous processes. Straight leg raise is positive on the left at 75 with pain in the left lumbar area radiating to the left buttock. There are no sensory or motor losses and extremities. Gait is normal. His pain is reproduced with flexion.  Neurological: He is alert and oriented to person, place, and time.  Skin: Skin is warm and dry.  Psychiatric: He has a normal mood and affect. His behavior is normal.  Nursing note and vitals reviewed.   Filed Vitals:   02/07/15 0953  BP: 122/80  Pulse: 54  Temp: 97.9 F (36.6 C)  TempSrc: Oral  Resp: 16  Height: 5\' 11"  (1.803 m)  Weight: 225 lb (102.059 kg)  SpO2: 98%   Assessment & Plan:  I have completed the patient encounter in its entirety as documented by the scribe, with editing by me where necessary. Robert P. Laney Pastor, M.D.  LBP radiating to left leg  Meds ordered this encounter  Medications  . predniSONE (DELTASONE) 20 MG tablet    Sig: 3/3/2/2/1/1 single daily dose for 6 days    Dispense:  12  tablet    Refill:  0  . meloxicam (MOBIC) 15 MG tablet    Sig: Take 1 tablet (15 mg total) by mouth daily.    Dispense:  30 tablet    Refill:  0  . cyclobenzaprine (FLEXERIL) 10 MG tablet    Sig: Take 1 tablet (10 mg total) by mouth at bedtime.    Dispense:  15 tablet    Refill:  0   F/u 2 w not well Stretching/heat/work changes 1-2 weeks

## 2015-02-17 ENCOUNTER — Encounter: Payer: Self-pay | Admitting: Internal Medicine

## 2015-03-04 ENCOUNTER — Other Ambulatory Visit: Payer: Self-pay | Admitting: Internal Medicine

## 2015-03-04 NOTE — Telephone Encounter (Signed)
Patient states feeling better does not need mobic

## 2015-05-20 ENCOUNTER — Telehealth: Payer: Self-pay

## 2015-05-20 NOTE — Telephone Encounter (Signed)
Waiting on payment of $36.75 for 62 pages. From paramed

## 2015-05-23 NOTE — Telephone Encounter (Signed)
Payment received and records were faxed on 05/23/15

## 2015-05-30 DIAGNOSIS — Z0271 Encounter for disability determination: Secondary | ICD-10-CM

## 2015-06-04 ENCOUNTER — Ambulatory Visit (INDEPENDENT_AMBULATORY_CARE_PROVIDER_SITE_OTHER): Payer: BC Managed Care – PPO | Admitting: Internal Medicine

## 2015-06-04 ENCOUNTER — Encounter: Payer: Self-pay | Admitting: Internal Medicine

## 2015-06-04 VITALS — BP 113/72 | HR 66 | Temp 98.0°F | Resp 16 | Ht 71.0 in | Wt 224.0 lb

## 2015-06-04 DIAGNOSIS — Z1211 Encounter for screening for malignant neoplasm of colon: Secondary | ICD-10-CM | POA: Diagnosis not present

## 2015-06-04 DIAGNOSIS — R7989 Other specified abnormal findings of blood chemistry: Secondary | ICD-10-CM

## 2015-06-04 DIAGNOSIS — R799 Abnormal finding of blood chemistry, unspecified: Secondary | ICD-10-CM

## 2015-06-04 DIAGNOSIS — R454 Irritability and anger: Secondary | ICD-10-CM

## 2015-06-04 DIAGNOSIS — I10 Essential (primary) hypertension: Secondary | ICD-10-CM

## 2015-06-04 DIAGNOSIS — C679 Malignant neoplasm of bladder, unspecified: Secondary | ICD-10-CM | POA: Diagnosis not present

## 2015-06-04 DIAGNOSIS — Z0000 Encounter for general adult medical examination without abnormal findings: Secondary | ICD-10-CM

## 2015-06-04 MED ORDER — LISINOPRIL 10 MG PO TABS: ORAL_TABLET | ORAL | Status: DC

## 2015-06-04 MED ORDER — PAROXETINE HCL 20 MG PO TABS: 20.0000 mg | ORAL_TABLET | Freq: Every day | ORAL | Status: DC

## 2015-06-04 NOTE — Progress Notes (Signed)
Subjective:    Patient ID: Christopher [name], male    DOB: 29-Jan-1951, 65 y.o.   MRN: [id number]  HPICPE Doing well! Patient Active Problem List   Diagnosis Date Noted  . 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 H. pylori infection-treated 2010 02/06/2013  . HTN (hypertension) 11/21/2011  . Anger 11/21/2011   Likes the effects of Paxil  Transitional cell 2007--he has stopped following w/ urology as he didn't like his experience after [place] [name] moved to [name] w/ no heamtur  [place] 2004 Leb  S/p knee repl  Declines Zostavax  Nonsmoker, no alcohol or illegal drug use. Happily married. Continues working. Review of Systems 14 point review of systems form indicates occasional fatigue, some excessive sweating work, mild hearing loss which does not affect his work. He has occasional edema of the lower extremities at the end of day area this past history of moodiness with nervousness and irritability have improved with Paxil Remainder the review of systems negative    Objective:   Physical Exam  Constitutional: He is oriented to person, place, and time. He appears well-developed and well-nourished.  HENT:  Head: Normocephalic and atraumatic.  Right Ear: Hearing, tympanic membrane, external ear and ear canal normal.  Left Ear: Hearing, tympanic membrane, external ear and ear canal normal.  Nose: Nose normal.  Mouth/Throat: Uvula is midline, oropharynx is clear and moist and mucous membranes are normal.  Eyes: Conjunctivae, EOM and lids are normal. Pupils are equal, round, and reactive to light. Right eye exhibits no discharge. Left eye exhibits no discharge. No scleral icterus.  Neck: Trachea normal and normal range of motion. Neck supple. Carotid bruit is not present.  Cardiovascular: Normal rate, regular rhythm, normal heart sounds, intact distal pulses and normal pulses.   No murmur heard. Pulmonary/Chest: Effort normal and breath sounds normal. No  respiratory distress. He has no wheezes. He has no rhonchi. He has no rales.  Abdominal: Soft. Normal appearance and bowel sounds are normal. He exhibits no abdominal bruit. There is no tenderness.  Musculoskeletal: Normal range of motion. He exhibits no edema or tenderness.  Lymphadenopathy:       Head (right side): No submental, no submandibular, no tonsillar, no preauricular, no posterior auricular and no occipital adenopathy present.       Head (left side): No submental, no submandibular, no tonsillar, no preauricular, no posterior auricular and no occipital adenopathy present.    He has no cervical adenopathy.  Neurological: He is alert and oriented to person, place, and time. He has normal strength and normal reflexes. No cranial nerve deficit or sensory deficit. Coordination and gait normal.  Skin: Skin is warm, dry and intact. No lesion and no rash noted.  Psychiatric: He has a normal mood and affect. His speech is normal and behavior is normal. Judgment and thought content normal.   BP 113/72 mmHg  Pulse 66  Temp(Src) 98 F (36.7 C)  Resp 16  Ht '5\' 11"'  (1.803 m)  Wt 224 lb (101.606 kg)  BMI 31.26 kg/m2  Wt Readings from Last 3 Encounters:  06/04/15 224 lb (101.606 kg)  02/07/15 225 lb (102.059 kg)  12/23/14 235 lb 3.2 oz (106.686 kg)  Making an effort at weight loss      Assessment & Plan:  Annual physical exam  Essential hypertension-cont lisin  Malignant neoplasm of urinary bladder, unspecified site (HCC)--follow with urol or at least yearly U/A with micro  Special screening for malignant neoplasms, colon--ref GI  Anger/irritability/anxiety/depression-contin  paxil  ? Who to start seeing for PCP as I retire 3 months Retire at [employer] like practice near there--I'll give the options for him to consider   Meds ordered this encounter  Medications  . PARoxetine (PAXIL) 20 MG tablet    Sig: Take 1 tablet (20 mg total) by mouth daily.    Dispense:   90 tablet    Refill:  3  . lisinopril (PRINIVIL,ZESTRIL) 10 MG tablet    Sig: One tablet daily    Dispense:  90 tablet    Refill:  3    His labs were not repeated today because of these results: Results for orders placed or performed in visit on 12/23/14  CBC with Differential/Platelet  Result Value Ref Range   WBC 8.2 4.0 - 10.5 K/uL   RBC 5.12 4.22 - 5.81 MIL/uL   Hemoglobin 14.8 13.0 - 17.0 g/dL   HCT 43.7 39.0 - 52.0 %   MCV 85.4 78.0 - 100.0 fL   MCH 28.9 26.0 - 34.0 pg   MCHC 33.9 30.0 - 36.0 g/dL   RDW 14.5 11.5 - 15.5 %   Platelets 302 150 - 400 K/uL   MPV 9.9 8.6 - 12.4 fL   Neutrophils Relative % 70 43 - 77 %   Neutro Abs 5.7 1.7 - 7.7 K/uL   Lymphocytes Relative 16 12 - 46 %   Lymphs Abs 1.3 0.7 - 4.0 K/uL   Monocytes Relative 10 3 - 12 %   Monocytes Absolute 0.8 0.1 - 1.0 K/uL   Eosinophils Relative 4 0 - 5 %   Eosinophils Absolute 0.3 0.0 - 0.7 K/uL   Basophils Relative 0 0 - 1 %   Basophils Absolute 0.0 0.0 - 0.1 K/uL   Smear Review Criteria for review not met   Comprehensive metabolic panel  Result Value Ref Range   Sodium 139 135 - 146 mmol/L   Potassium 4.3 3.5 - 5.3 mmol/L   Chloride 104 98 - 110 mmol/L   CO2 25 20 - 31 mmol/L   Glucose, Bld 91 65 - 99 mg/dL   BUN 24 7 - 25 mg/dL   Creat 1.96 (H) 0.70 - 1.25 mg/dL   Total Bilirubin 0.7 0.2 - 1.2 mg/dL   Alkaline Phosphatase 89 40 - 115 U/L   AST 20 10 - 35 U/L   ALT 17 9 - 46 U/L   Total Protein 7.0 6.1 - 8.1 g/dL   Albumin 4.5 3.6 - 5.1 g/dL   Calcium 10.1 8.6 - 10.3 mg/dL  Lipid panel  Result Value Ref Range   Cholesterol 161 125 - 200 mg/dL   Triglycerides 99 <150 mg/dL   HDL 55 >=40 mg/dL   Total CHOL/HDL Ratio 2.9 <=5.0 Ratio   VLDL 20 <30 mg/dL   LDL Cholesterol 86 <130 mg/dL  PSA  Result Value Ref Range   PSA 2.22 <=4.00 ng/mL   He needs follow-up labs especially to review creatinine, but will not need routine lipids until October I'll have him come in next week.

## 2015-06-17 ENCOUNTER — Other Ambulatory Visit: Payer: Self-pay | Admitting: Emergency Medicine

## 2015-06-24 ENCOUNTER — Other Ambulatory Visit: Payer: Self-pay

## 2015-06-24 MED ORDER — AMLODIPINE BESYLATE 5 MG PO TABS: 5.0000 mg | ORAL_TABLET | Freq: Every day | ORAL | Status: DC

## 2015-06-24 NOTE — Telephone Encounter (Signed)
Pharm req'd Rf of amlodipine. Dr Laney Pastor did not RF at time of OV, so I gave him same RFs as other BP med written at [place]. I noticed this note at the end of the OV routed to Lab, but not sure if Lab ever saw the message and called pt. "  After he left following his physical on 3:15 and noticed that his creatinine in August 2016 was slightly elevated. This needs recheck as it may relate to the effects of high blood pressure on his kidney- so I entered a future order for basic metabolic profile. Would you call him to ask him to stop by any point to have his blood drawn in the next month. This will not require an office visit       I called pt and gave him Dr Doolittle's instr's. Pt was not aware, but agreed to come back for lab.

## 2015-06-25 ENCOUNTER — Other Ambulatory Visit (INDEPENDENT_AMBULATORY_CARE_PROVIDER_SITE_OTHER): Payer: BC Managed Care – PPO

## 2015-06-25 DIAGNOSIS — R7989 Other specified abnormal findings of blood chemistry: Secondary | ICD-10-CM

## 2015-06-25 DIAGNOSIS — R799 Abnormal finding of blood chemistry, unspecified: Secondary | ICD-10-CM | POA: Diagnosis not present

## 2015-06-25 DIAGNOSIS — I10 Essential (primary) hypertension: Secondary | ICD-10-CM

## 2015-06-26 LAB — BASIC METABOLIC PANEL
BUN: 26 mg/dL — ABNORMAL HIGH (ref 7–25)
CO2: 25 mmol/L (ref 20–31)
Calcium: 9.3 mg/dL (ref 8.6–10.3)
Chloride: 104 mmol/L (ref 98–110)
Creat: 1.84 mg/dL — ABNORMAL HIGH (ref 0.70–1.25)
Glucose, Bld: 119 mg/dL — ABNORMAL HIGH (ref 65–99)
POTASSIUM: 4.7 mmol/L (ref 3.5–5.3)
SODIUM: 140 mmol/L (ref 135–146)

## 2015-06-27 NOTE — Progress Notes (Signed)
   Subjective:    Patient ID: Christopher [name], male    DOB: [birth year], 65 y.o.   MRN: [id number]  HPIf/u labs only    Review of Systems     Objective:   Physical Exam   Results for orders placed or performed in visit on [id number]  Basic metabolic panel  Result Value Ref Range   Sodium 140 135 - 146 mmol/L   Potassium 4.7 3.5 - 5.3 mmol/L   Chloride 104 98 - 110 mmol/L   CO2 25 20 - 31 mmol/L   Glucose, Bld 119 (H) 65 - 99 mg/dL   BUN 26 (H) 7 - 25 mg/dL   Creat 1.84 (H) 0.70 - 1.25 mg/dL   Calcium 9.3 8.6 - 10.3 mg/dL  This creatinine level has been mildly elevated since at least 2013 and has been stable.      Assessment & Plan:  He will F/u with new PCP 6 months

## 2015-07-03 ENCOUNTER — Encounter: Payer: Self-pay | Admitting: Internal Medicine

## 2016-08-26 ENCOUNTER — Ambulatory Visit (INDEPENDENT_AMBULATORY_CARE_PROVIDER_SITE_OTHER): Payer: BC Managed Care – PPO | Admitting: Emergency Medicine

## 2016-08-26 ENCOUNTER — Encounter: Payer: Self-pay | Admitting: Emergency Medicine

## 2016-08-26 VITALS — BP 150/76 | HR 55 | Temp 98.5°F | Resp 16 | Ht 69.25 in | Wt 224.4 lb

## 2016-08-26 DIAGNOSIS — F329 Major depressive disorder, single episode, unspecified: Secondary | ICD-10-CM | POA: Diagnosis not present

## 2016-08-26 DIAGNOSIS — I10 Essential (primary) hypertension: Secondary | ICD-10-CM

## 2016-08-26 DIAGNOSIS — F32A Depression, unspecified: Secondary | ICD-10-CM | POA: Insufficient documentation

## 2016-08-26 MED ORDER — AMLODIPINE BESYLATE 5 MG PO TABS: 5.0000 mg | ORAL_TABLET | Freq: Every day | ORAL | 3 refills | Status: DC

## 2016-08-26 MED ORDER — PAROXETINE HCL 20 MG PO TABS: 20.0000 mg | ORAL_TABLET | Freq: Every day | ORAL | 3 refills | Status: DC

## 2016-08-26 MED ORDER — LISINOPRIL 10 MG PO TABS: ORAL_TABLET | ORAL | 3 refills | Status: DC

## 2016-08-26 NOTE — Patient Instructions (Addendum)
IF you received an x-ray today, you will receive an invoice from [facility]. Please contact [facility] at [phone] with questions or concerns regarding your invoice.   IF you received labwork today, you will receive an invoice from [facility]. Please contact [facility] at [phone] with questions or concerns regarding your invoice.   Our billing staff will not be able to assist you with questions regarding bills from these companies.  You will be contacted with the lab results as soon as they are available. The fastest way to get your results is to activate your My Chart account. Instructions are located on the last page of this paperwork. If you have not heard from [place] regarding the results in 2 weeks, please contact this office.     Hypertension Hypertension is another name for high blood pressure. High blood pressure forces your heart to work harder to pump blood. This can cause problems over time. There are two numbers in a blood pressure reading. There is a top number (systolic) over a bottom number (diastolic). It is best to have a blood pressure below 120/80. Healthy choices can help lower your blood pressure. You may need medicine to help lower your blood pressure if:  Your blood pressure cannot be lowered with healthy choices.  Your blood pressure is higher than 130/80.  Follow these instructions at home: Eating and drinking  If directed, follow the DASH eating plan. This diet includes: ? Filling half of your plate at each meal with fruits and vegetables. ? Filling one quarter of your plate at each meal with whole grains. Whole grains include whole wheat pasta, brown rice, and whole grain bread. ? Eating or drinking low-fat dairy products, such as skim milk or low-fat yogurt. ? Filling one quarter of your plate at each meal with low-fat (lean) proteins. Low-fat proteins include fish, skinless chicken, eggs, beans, and tofu. ? Avoiding fatty meat, cured  and processed meat, or chicken with skin. ? Avoiding premade or processed food.  Eat less than 1,500 mg of salt (sodium) a day.  Limit alcohol use to no more than 1 drink a day for nonpregnant women and 2 drinks a day for men. One drink equals 12 oz of beer, 5 oz of wine, or 1 oz of hard liquor. Lifestyle  Work with your doctor to stay at a healthy weight or to lose weight. Ask your doctor what the best weight is for you.  Get at least 30 minutes of exercise that causes your heart to beat faster (aerobic exercise) most days of the week. This may include walking, swimming, or biking.  Get at least 30 minutes of exercise that strengthens your muscles (resistance exercise) at least 3 days a week. This may include lifting weights or pilates.  Do not use any products that contain nicotine or tobacco. This includes cigarettes and e-cigarettes. If you need help quitting, ask your doctor.  Check your blood pressure at home as told by your doctor.  Keep all follow-up visits as told by your doctor. This is important. Medicines  Take over-the-counter and prescription medicines only as told by your doctor. Follow directions carefully.  Do not skip doses of blood pressure medicine. The medicine does not work as well if you skip doses. Skipping doses also puts you at risk for problems.  Ask your doctor about side effects or reactions to medicines that you should watch for. Contact a doctor if:  You think you are having a reaction to the  medicine you are taking.  You have headaches that keep coming back (recurring).  You feel dizzy.  You have swelling in your ankles.  You have trouble with your vision. Get help right away if:  You get a very bad headache.  You start to feel confused.  You feel weak or numb.  You feel faint.  You get very bad pain in your: ? Chest. ? Belly (abdomen).  You throw up (vomit) more than once.  You have trouble breathing. Summary  Hypertension is  another name for high blood pressure.  Making healthy choices can help lower blood pressure. If your blood pressure cannot be controlled with healthy choices, you may need to take medicine. This information is not intended to replace advice given to you by your health care provider. Make sure you discuss any questions you have with your health care provider. Document Released: 08/25/2007 Document Revised: 02/04/2016 Document Reviewed: 02/04/2016 Elsevier Interactive Patient Education  [employer].

## 2016-08-26 NOTE — Progress Notes (Signed)
Christopher [name] 66 y.o.   Chief Complaint  Patient presents with  . Medication Refill    Amlodipine  paxil and  lisinopril     HISTORY OF PRESENT ILLNESS:  This is a 66 y.o. male with h/o HTN; needs medication refills. No complaints or medical concerns.  HPI   Prior to Admission medications   Medication Sig Start Date End Date Taking? Authorizing Provider  amLODipine (NORVASC) 5 MG tablet Take 1 tablet (5 mg total) by mouth daily. 06/24/15  Yes Leandrew Koyanagi, MD  lisinopril (PRINIVIL,ZESTRIL) 10 MG tablet One tablet daily 06/04/15  Yes Leandrew Koyanagi, MD  PARoxetine (PAXIL) 20 MG tablet Take 1 tablet (20 mg total) by mouth daily. 06/04/15  Yes Leandrew Koyanagi, MD  tamsulosin [facility]) 0.4 MG CAPS capsule  04/12/15  Yes [provider]    No Known Allergies  Patient Active Problem List   Diagnosis Date Noted  . 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 H. pylori infection-treated 2010 02/06/2013  . HTN (hypertension) 11/21/2011  . Anger 11/21/2011    Past Medical History:  Diagnosis Date  . Anxiety   . Cancer ([place])   . Depression   . Hypertension     Past Surgical History:  Procedure Laterality Date  . BLADDER SURGERY    . JOINT REPLACEMENT  2016    Social History   Social History  . Marital status: Single    Spouse name: N/A  . Number of children: N/A  . Years of education: N/A   Occupational History  . Not on file.   Social History Main Topics  . Smoking status: Former Smoker    Quit date: 03/22/1972  . Smokeless tobacco: Never Used  . Alcohol use 0.0 oz/week     Comment: rare  . Drug use: No  . Sexual activity: Not on file   Other Topics Concern  . Not on file   Social History Narrative  . No narrative on file    Family History  Problem Relation Age of Onset  . Cancer Mother   . Cancer Father   . Hypertension Father   . Heart disease Father   . Cancer Sister   . Diabetes Sister   . Heart disease  Sister   . Hypertension Sister   . Mental illness Brother   . Colon cancer Neg Hx      Review of Systems  Constitutional: Negative.  Negative for chills, fever and weight loss.  HENT: Negative.  Negative for congestion, ear pain, nosebleeds and sinus pain.   Eyes: Negative.  Negative for blurred vision and double vision.  Respiratory: Negative.  Negative for cough and shortness of breath.   Cardiovascular: Negative.  Negative for chest pain, palpitations and claudication.  Gastrointestinal: Negative.  Negative for abdominal pain, diarrhea, nausea and vomiting.  Genitourinary: Negative.  Negative for dysuria and hematuria.  Musculoskeletal: Negative.  Negative for back pain, myalgias and neck pain.  Skin: Negative.  Negative for rash.  Neurological: Negative.  Negative for dizziness, sensory change, focal weakness and headaches.  Endo/Heme/Allergies: Negative.   All other systems reviewed and are negative.  Vitals:   08/26/16 0945 08/26/16 0946  BP: (!) 172/81 (!) 150/76  Pulse: (!) 55   Resp: 16   Temp: 98.5 F (36.9 C)       Physical Exam  Constitutional: He is oriented to person, place, and time. He appears well-developed and well-nourished.  HENT:  Head: Normocephalic.  Right Ear: External ear normal.  Left Ear: External ear normal.  Nose: Nose normal.  Mouth/Throat: Oropharynx is clear and moist.  Eyes: Conjunctivae and EOM are normal. Pupils are equal, round, and reactive to light.  Neck: Normal range of motion. Neck supple. No JVD present. No thyromegaly present.  Cardiovascular: Normal rate, regular rhythm, normal heart sounds and intact distal pulses.   Pulmonary/Chest: Effort normal and breath sounds normal.  Abdominal: Soft. Bowel sounds are normal. He exhibits no distension. There is no tenderness.  Musculoskeletal: Normal range of motion.  Lymphadenopathy:    He has no cervical adenopathy.  Neurological: He is alert and oriented to person, place, and time.  No sensory deficit. He exhibits normal muscle tone.  Skin: Skin is warm and dry. Capillary refill takes less than 2 seconds. No rash noted.  Psychiatric: He has a normal mood and affect. His behavior is normal.  Vitals reviewed.    ASSESSMENT & PLAN: Calix was seen today for medication refill.  Diagnoses and all orders for this visit:  Essential hypertension  Depression, unspecified depression type  Other orders -     amLODipine (NORVASC) 5 MG tablet; Take 1 tablet (5 mg total) by mouth daily. -     lisinopril (PRINIVIL,ZESTRIL) 10 MG tablet; One tablet daily -     PARoxetine (PAXIL) 20 MG tablet; Take 1 tablet (20 mg total) by mouth daily.    Patient Instructions       IF you received an x-ray today, you will receive an invoice from [facility]. Please contact [facility] at [phone] with questions or concerns regarding your invoice.   IF you received labwork today, you will receive an invoice from [facility]. Please contact [facility] at [phone] with questions or concerns regarding your invoice.   Our billing staff will not be able to assist you with questions regarding bills from these companies.  You will be contacted with the lab results as soon as they are available. The fastest way to get your results is to activate your My Chart account. Instructions are located on the last page of this paperwork. If you have not heard from [place] regarding the results in 2 weeks, please contact this office.     Hypertension Hypertension is another name for high blood pressure. High blood pressure forces your heart to work harder to pump blood. This can cause problems over time. There are two numbers in a blood pressure reading. There is a top number (systolic) over a bottom number (diastolic). It is best to have a blood pressure below 120/80. Healthy choices can help lower your blood pressure. You may need medicine to help lower your blood pressure if:  Your blood  pressure cannot be lowered with healthy choices.  Your blood pressure is higher than 130/80.  Follow these instructions at home: Eating and drinking  If directed, follow the DASH eating plan. This diet includes: ? Filling half of your plate at each meal with fruits and vegetables. ? Filling one quarter of your plate at each meal with whole grains. Whole grains include whole wheat pasta, brown rice, and whole grain bread. ? Eating or drinking low-fat dairy products, such as skim milk or low-fat yogurt. ? Filling one quarter of your plate at each meal with low-fat (lean) proteins. Low-fat proteins include fish, skinless chicken, eggs, beans, and tofu. ? Avoiding fatty meat, cured and processed meat, or chicken with skin. ? Avoiding premade or processed food.  Eat less than 1,500 mg of  salt (sodium) a day.  Limit alcohol use to no more than 1 drink a day for nonpregnant women and 2 drinks a day for men. One drink equals 12 oz of beer, 5 oz of wine, or 1 oz of hard liquor. Lifestyle  Work with your doctor to stay at a healthy weight or to lose weight. Ask your doctor what the best weight is for you.  Get at least 30 minutes of exercise that causes your heart to beat faster (aerobic exercise) most days of the week. This may include walking, swimming, or biking.  Get at least 30 minutes of exercise that strengthens your muscles (resistance exercise) at least 3 days a week. This may include lifting weights or pilates.  Do not use any products that contain nicotine or tobacco. This includes cigarettes and e-cigarettes. If you need help quitting, ask your doctor.  Check your blood pressure at home as told by your doctor.  Keep all follow-up visits as told by your doctor. This is important. Medicines  Take over-the-counter and prescription medicines only as told by your doctor. Follow directions carefully.  Do not skip doses of blood pressure medicine. The medicine does not work as well if  you skip doses. Skipping doses also puts you at risk for problems.  Ask your doctor about side effects or reactions to medicines that you should watch for. Contact a doctor if:  You think you are having a reaction to the medicine you are taking.  You have headaches that keep coming back (recurring).  You feel dizzy.  You have swelling in your ankles.  You have trouble with your vision. Get help right away if:  You get a very bad headache.  You start to feel confused.  You feel weak or numb.  You feel faint.  You get very bad pain in your: ? Chest. ? Belly (abdomen).  You throw up (vomit) more than once.  You have trouble breathing. Summary  Hypertension is another name for high blood pressure.  Making healthy choices can help lower blood pressure. If your blood pressure cannot be controlled with healthy choices, you may need to take medicine. This information is not intended to replace advice given to you by your health care provider. Make sure you discuss any questions you have with your health care provider. Document Released: 08/25/2007 Document Revised: 02/04/2016 Document Reviewed: 02/04/2016 Elsevier Interactive Patient Education  2018 Elsevier Inc.      Agustina Caroli, MD [facility]

## 2017-09-10 ENCOUNTER — Encounter: Payer: Self-pay | Admitting: Emergency Medicine

## 2017-09-10 ENCOUNTER — Ambulatory Visit (INDEPENDENT_AMBULATORY_CARE_PROVIDER_SITE_OTHER): Payer: Medicare Other | Admitting: Emergency Medicine

## 2017-09-10 DIAGNOSIS — F418 Other specified anxiety disorders: Secondary | ICD-10-CM | POA: Diagnosis not present

## 2017-09-10 DIAGNOSIS — F329 Major depressive disorder, single episode, unspecified: Secondary | ICD-10-CM

## 2017-09-10 DIAGNOSIS — I10 Essential (primary) hypertension: Secondary | ICD-10-CM

## 2017-09-10 DIAGNOSIS — F32A Depression, unspecified: Secondary | ICD-10-CM

## 2017-09-10 MED ORDER — ALPRAZOLAM 0.5 MG PO TABS: 0.5000 mg | ORAL_TABLET | Freq: Two times a day (BID) | ORAL | 0 refills | Status: DC | PRN

## 2017-09-10 MED ORDER — AMLODIPINE BESYLATE 5 MG PO TABS: 5.0000 mg | ORAL_TABLET | Freq: Every day | ORAL | 3 refills | Status: DC

## 2017-09-10 MED ORDER — PAROXETINE HCL 20 MG PO TABS: 20.0000 mg | ORAL_TABLET | Freq: Every day | ORAL | 3 refills | Status: DC

## 2017-09-10 MED ORDER — LISINOPRIL 10 MG PO TABS: ORAL_TABLET | ORAL | 3 refills | Status: DC

## 2017-09-10 NOTE — Progress Notes (Signed)
Christopher [name] 67 y.o.   Chief Complaint  Patient presents with  . Medication Refill    norvasc, lisinoril, paxil, flomax   . Anxiety    wife had a stroke, has two grandkids and father in law living with them with alzheimers     HISTORY OF PRESENT ILLNESS: This is a 67 y.o. male with history of hypertension and depression here for follow-up.  Needs medication refills. Also complaining of recent increased emotional anxiety due to wife's recent his stroke and father-in-law's dementia.  Requesting some medication to help him with occasional anxiety attacks. No other significant complaints or medical concerns.  HPI   Prior to Admission medications   Medication Sig Start Date End Date Taking? Authorizing Provider  amLODipine (NORVASC) 5 MG tablet Take 1 tablet (5 mg total) by mouth daily. 08/26/16   Horald Pollen, MD  lisinopril (PRINIVIL,ZESTRIL) 10 MG tablet One tablet daily 08/26/16   Horald Pollen, MD  PARoxetine (PAXIL) 20 MG tablet Take 1 tablet (20 mg total) by mouth daily. 08/26/16   Horald Pollen, MD  tamsulosin [facility]) 0.4 MG CAPS capsule  04/12/15   [provider]    No Known Allergies  Patient Active Problem List   Diagnosis Date Noted  . Depression 08/26/2016  . 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 H. pylori infection-treated 2010 02/06/2013  . HTN (hypertension) 11/21/2011  . Anger 11/21/2011    Past Medical History:  Diagnosis Date  . Anxiety   . Cancer ([place])   . Depression   . Hypertension     Past Surgical History:  Procedure Laterality Date  . BLADDER SURGERY    . JOINT REPLACEMENT  2016    Social History   Socioeconomic History  . Marital status: Single    Spouse name: Not on file  . Number of children: Not on file  . Years of education: Not on file  . Highest education level: Not on file  Occupational History  . Not on file  Social Needs  . Financial resource strain: Not on file  .  Food insecurity:    Worry: Not on file    Inability: Not on file  . Transportation needs:    Medical: Not on file    Non-medical: Not on file  Tobacco Use  . Smoking status: Former Smoker    Last attempt to quit: 03/22/1972    Years since quitting: 45.5  . Smokeless tobacco: Never Used  Substance and Sexual Activity  . Alcohol use: Yes    Alcohol/week: 0.0 oz    Comment: rare  . Drug use: No  . Sexual activity: Not on file  Lifestyle  . Physical activity:    Days per week: Not on file    Minutes per session: Not on file  . Stress: Not on file  Relationships  . Social connections:    Talks on phone: Not on file    Gets together: Not on file    Attends religious service: Not on file    Active member of club or organization: Not on file    Attends meetings of clubs or organizations: Not on file    Relationship status: Not on file  . Intimate partner violence:    Fear of current or ex partner: Not on file    Emotionally abused: Not on file    Physically abused: Not on file    Forced sexual activity: Not on file  Other Topics Concern  .  Not on file  Social History Narrative  . Not on file    Family History  Problem Relation Age of Onset  . Cancer Mother   . Cancer Father   . Hypertension Father   . Heart disease Father   . Cancer Sister   . Diabetes Sister   . Heart disease Sister   . Hypertension Sister   . Mental illness Brother   . Colon cancer Neg Hx      Review of Systems  Constitutional: Negative.  Negative for chills, fever and weight loss.  HENT: Negative.  Negative for congestion, hearing loss, nosebleeds and sore throat.   Eyes: Negative.  Negative for blurred vision and double vision.  Respiratory: Negative.  Negative for cough and shortness of breath.   Cardiovascular: Negative.  Negative for chest pain and palpitations.  Gastrointestinal: Negative.  Negative for abdominal pain, diarrhea, nausea and vomiting.  Genitourinary: Negative for dysuria and  hematuria.  Musculoskeletal: Negative.   Skin: Negative.  Negative for rash.  Neurological: Negative for dizziness and headaches.  Endo/Heme/Allergies: Negative.   All other systems reviewed and are negative.   Physical Exam  Constitutional: He is oriented to person, place, and time. He appears well-developed and well-nourished.  HENT:  Head: Normocephalic and atraumatic.  Nose: Nose normal.  Mouth/Throat: Oropharynx is clear and moist.  Eyes: Pupils are equal, round, and reactive to light. Conjunctivae are normal.  Neck: Normal range of motion. No thyromegaly present.  Cardiovascular: Normal rate, regular rhythm and normal heart sounds.  Pulmonary/Chest: Effort normal and breath sounds normal.  Abdominal: Soft. There is no tenderness.  Musculoskeletal: Normal range of motion. He exhibits no edema or tenderness.  Lymphadenopathy:    He has no cervical adenopathy.  Neurological: He is alert and oriented to person, place, and time. No sensory deficit. He exhibits normal muscle tone.  Skin: Skin is warm and dry. Capillary refill takes less than 2 seconds.  Psychiatric: He has a normal mood and affect. His behavior is normal.  Vitals reviewed.  A total of 25 minutes was spent in the room with the patient, greater than 50% of which was in counseling/coordination of care regarding chronic medical problems including hypertension depression and anxiety, management, medications, and need for follow-up.   ASSESSMENT & PLAN: Robbin was seen today for medication refill and anxiety.  Diagnoses and all orders for this visit:  Situational anxiety -     ALPRAZolam (XANAX) 0.5 MG tablet; Take 1 tablet (0.5 mg total) by mouth 2 (two) times daily as needed for anxiety.  Essential hypertension -     lisinopril (PRINIVIL,ZESTRIL) 10 MG tablet; One tablet daily -     amLODipine (NORVASC) 5 MG tablet; Take 1 tablet (5 mg total) by mouth daily.  Depression, unspecified depression type -      PARoxetine (PAXIL) 20 MG tablet; Take 1 tablet (20 mg total) by mouth daily.    Patient Instructions       IF you received an x-ray today, you will receive an invoice from [facility]. Please contact [facility] at [phone] with questions or concerns regarding your invoice.   IF you received labwork today, you will receive an invoice from [facility]. Please contact [facility] at [phone] with questions or concerns regarding your invoice.   Our billing staff will not be able to assist you with questions regarding bills from these companies.  You will be contacted with the lab results as soon as they are available. The fastest way  to get your results is to activate your My Chart account. Instructions are located on the last page of this paperwork. If you have not heard from [place] regarding the results in 2 weeks, please contact this office.      Hypertension Hypertension, commonly called high blood pressure, is when the force of blood pumping through the arteries is too strong. The arteries are the blood vessels that carry blood from the heart throughout the body. Hypertension forces the heart to work harder to pump blood and may cause arteries to become narrow or stiff. Having untreated or uncontrolled hypertension can cause heart attacks, strokes, kidney disease, and other problems. A blood pressure reading consists of a higher number over a lower number. Ideally, your blood pressure should be below 120/80. The first ("top") number is called the systolic pressure. It is a measure of the pressure in your arteries as your heart beats. The second ("bottom") number is called the diastolic pressure. It is a measure of the pressure in your arteries as the heart relaxes. What are the causes? The cause of this condition is not known. What increases the risk? Some risk factors for high blood pressure are under your control. Others are not. Factors you can  change  Smoking.  Having type 2 diabetes mellitus, high cholesterol, or both.  Not getting enough exercise or physical activity.  Being overweight.  Having too much fat, sugar, calories, or salt (sodium) in your diet.  Drinking too much alcohol. Factors that are difficult or impossible to change  Having chronic kidney disease.  Having a family history of high blood pressure.  Age. Risk increases with age.  Race. You may be at higher risk if you are African-American.  Gender. Men are at higher risk than women before age 39. After age 68, women are at higher risk than men.  Having obstructive sleep apnea.  Stress. What are the signs or symptoms? Extremely high blood pressure (hypertensive crisis) may cause:  Headache.  Anxiety.  Shortness of breath.  Nosebleed.  Nausea and vomiting.  Severe chest pain.  Jerky movements you cannot control (seizures).  How is this diagnosed? This condition is diagnosed by measuring your blood pressure while you are seated, with your arm resting on a surface. The cuff of the blood pressure monitor will be placed directly against the skin of your upper arm at the level of your heart. It should be measured at least twice using the same arm. Certain conditions can cause a difference in blood pressure between your right and left arms. Certain factors can cause blood pressure readings to be lower or higher than normal (elevated) for a short period of time:  When your blood pressure is higher when you are in a health care provider's office than when you are at home, this is called white coat hypertension. Most people with this condition do not need medicines.  When your blood pressure is higher at home than when you are in a health care provider's office, this is called masked hypertension. Most people with this condition may need medicines to control blood pressure.  If you have a high blood pressure reading during one visit or you have  normal blood pressure with other risk factors:  You may be asked to return on a different day to have your blood pressure checked again.  You may be asked to monitor your blood pressure at home for 1 week or longer.  If you are diagnosed with hypertension, you may have  other blood or imaging tests to help your health care provider understand your overall risk for other conditions. How is this treated? This condition is treated by making healthy lifestyle changes, such as eating healthy foods, exercising more, and reducing your alcohol intake. Your health care provider may prescribe medicine if lifestyle changes are not enough to get your blood pressure under control, and if:  Your systolic blood pressure is above 130.  Your diastolic blood pressure is above 80.  Your personal target blood pressure may vary depending on your medical conditions, your age, and other factors. Follow these instructions at home: Eating and drinking  Eat a diet that is high in fiber and potassium, and low in sodium, added sugar, and fat. An example eating plan is called the DASH (Dietary Approaches to Stop Hypertension) diet. To eat this way: ? Eat plenty of fresh fruits and vegetables. Try to fill half of your plate at each meal with fruits and vegetables. ? Eat whole grains, such as whole wheat pasta, brown rice, or whole grain bread. Fill about one quarter of your plate with whole grains. ? Eat or drink low-fat dairy products, such as skim milk or low-fat yogurt. ? Avoid fatty cuts of meat, processed or cured meats, and poultry with skin. Fill about one quarter of your plate with lean proteins, such as fish, chicken without skin, beans, eggs, and tofu. ? Avoid premade and processed foods. These tend to be higher in sodium, added sugar, and fat.  Reduce your daily sodium intake. Most people with hypertension should eat less than 1,500 mg of sodium a day.  Limit alcohol intake to no more than 1 drink a day for  nonpregnant women and 2 drinks a day for men. One drink equals 12 oz of beer, 5 oz of wine, or 1 oz of hard liquor. Lifestyle  Work with your health care provider to maintain a healthy body weight or to lose weight. Ask what an ideal weight is for you.  Get at least 30 minutes of exercise that causes your heart to beat faster (aerobic exercise) most days of the week. Activities may include walking, swimming, or biking.  Include exercise to strengthen your muscles (resistance exercise), such as pilates or lifting weights, as part of your weekly exercise routine. Try to do these types of exercises for 30 minutes at least 3 days a week.  Do not use any products that contain nicotine or tobacco, such as cigarettes and e-cigarettes. If you need help quitting, ask your health care provider.  Monitor your blood pressure at home as told by your health care provider.  Keep all follow-up visits as told by your health care provider. This is important. Medicines  Take over-the-counter and prescription medicines only as told by your health care provider. Follow directions carefully. Blood pressure medicines must be taken as prescribed.  Do not skip doses of blood pressure medicine. Doing this puts you at risk for problems and can make the medicine less effective.  Ask your health care provider about side effects or reactions to medicines that you should watch for. Contact a health care provider if:  You think you are having a reaction to a medicine you are taking.  You have headaches that keep coming back (recurring).  You feel dizzy.  You have swelling in your ankles.  You have trouble with your vision. Get help right away if:  You develop a severe headache or confusion.  You have unusual weakness or numbness.  You feel faint.  You have severe pain in your chest or abdomen.  You vomit repeatedly.  You have trouble breathing. Summary  Hypertension is when the force of blood pumping  through your arteries is too strong. If this condition is not controlled, it may put you at risk for serious complications.  Your personal target blood pressure may vary depending on your medical conditions, your age, and other factors. For most people, a normal blood pressure is less than 120/80.  Hypertension is treated with lifestyle changes, medicines, or a combination of both. Lifestyle changes include weight loss, eating a healthy, low-sodium diet, exercising more, and limiting alcohol. This information is not intended to replace advice given to you by your health care provider. Make sure you discuss any questions you have with your health care provider. Document Released: 03/08/2005 Document Revised: 02/04/2016 Document Reviewed: 02/04/2016 Elsevier Interactive Patient Education  2018 Elsevier Inc.      Agustina Caroli, MD [facility]

## 2017-09-10 NOTE — Patient Instructions (Addendum)
   IF you received an x-ray today, you will receive an invoice from [facility]. Please contact [facility] at [phone] with questions or concerns regarding your invoice.   IF you received labwork today, you will receive an invoice from [facility]. Please contact [facility] at [phone] with questions or concerns regarding your invoice.   Our billing staff will not be able to assist you with questions regarding bills from these companies.  You will be contacted with the lab results as soon as they are available. The fastest way to get your results is to activate your My Chart account. Instructions are located on the last page of this paperwork. If you have not heard from us regarding the results in 2 weeks, please contact this office.     Hypertension Hypertension, commonly called high blood pressure, is when the force of blood pumping through the arteries is too strong. The arteries are the blood vessels that carry blood from the heart throughout the body. Hypertension forces the heart to work harder to pump blood and may cause arteries to become narrow or stiff. Having untreated or uncontrolled hypertension can cause heart attacks, strokes, kidney disease, and other problems. A blood pressure reading consists of a higher number over a lower number. Ideally, your blood pressure should be below 120/80. The first ("top") number is called the systolic pressure. It is a measure of the pressure in your arteries as your heart beats. The second ("bottom") number is called the diastolic pressure. It is a measure of the pressure in your arteries as the heart relaxes. What are the causes? The cause of this condition is not known. What increases the risk? Some risk factors for high blood pressure are under your control. Others are not. Factors you can change  Smoking.  Having type 2 diabetes mellitus, high cholesterol, or both.  Not getting enough exercise or physical  activity.  Being overweight.  Having too much fat, sugar, calories, or salt (sodium) in your diet.  Drinking too much alcohol. Factors that are difficult or impossible to change  Having chronic kidney disease.  Having a family history of high blood pressure.  Age. Risk increases with age.  Race. You may be at higher risk if you are African-American.  Gender. Men are at higher risk than women before age 45. After age 65, women are at higher risk than men.  Having obstructive sleep apnea.  Stress. What are the signs or symptoms? Extremely high blood pressure (hypertensive crisis) may cause:  Headache.  Anxiety.  Shortness of breath.  Nosebleed.  Nausea and vomiting.  Severe chest pain.  Jerky movements you cannot control (seizures).  How is this diagnosed? This condition is diagnosed by measuring your blood pressure while you are seated, with your arm resting on a surface. The cuff of the blood pressure monitor will be placed directly against the skin of your upper arm at the level of your heart. It should be measured at least twice using the same arm. Certain conditions can cause a difference in blood pressure between your right and left arms. Certain factors can cause blood pressure readings to be lower or higher than normal (elevated) for a short period of time:  When your blood pressure is higher when you are in a health care provider's office than when you are at home, this is called white coat hypertension. Most people with this condition do not need medicines.  When your blood pressure is higher at home than when you   are in a health care provider's office, this is called masked hypertension. Most people with this condition may need medicines to control blood pressure.  If you have a high blood pressure reading during one visit or you have normal blood pressure with other risk factors:  You may be asked to return on a different day to have your blood pressure  checked again.  You may be asked to monitor your blood pressure at home for 1 week or longer.  If you are diagnosed with hypertension, you may have other blood or imaging tests to help your health care provider understand your overall risk for other conditions. How is this treated? This condition is treated by making healthy lifestyle changes, such as eating healthy foods, exercising more, and reducing your alcohol intake. Your health care provider may prescribe medicine if lifestyle changes are not enough to get your blood pressure under control, and if:  Your systolic blood pressure is above 130.  Your diastolic blood pressure is above 80.  Your personal target blood pressure may vary depending on your medical conditions, your age, and other factors. Follow these instructions at home: Eating and drinking  Eat a diet that is high in fiber and potassium, and low in sodium, added sugar, and fat. An example eating plan is called the DASH (Dietary Approaches to Stop Hypertension) diet. To eat this way: ? Eat plenty of fresh fruits and vegetables. Try to fill half of your plate at each meal with fruits and vegetables. ? Eat whole grains, such as whole wheat pasta, brown rice, or whole grain bread. Fill about one quarter of your plate with whole grains. ? Eat or drink low-fat dairy products, such as skim milk or low-fat yogurt. ? Avoid fatty cuts of meat, processed or cured meats, and poultry with skin. Fill about one quarter of your plate with lean proteins, such as fish, chicken without skin, beans, eggs, and tofu. ? Avoid premade and processed foods. These tend to be higher in sodium, added sugar, and fat.  Reduce your daily sodium intake. Most people with hypertension should eat less than 1,500 mg of sodium a day.  Limit alcohol intake to no more than 1 drink a day for nonpregnant women and 2 drinks a day for men. One drink equals 12 oz of beer, 5 oz of wine, or 1 oz of hard  liquor. Lifestyle  Work with your health care provider to maintain a healthy body weight or to lose weight. Ask what an ideal weight is for you.  Get at least 30 minutes of exercise that causes your heart to beat faster (aerobic exercise) most days of the week. Activities may include walking, swimming, or biking.  Include exercise to strengthen your muscles (resistance exercise), such as pilates or lifting weights, as part of your weekly exercise routine. Try to do these types of exercises for 30 minutes at least 3 days a week.  Do not use any products that contain nicotine or tobacco, such as cigarettes and e-cigarettes. If you need help quitting, ask your health care provider.  Monitor your blood pressure at home as told by your health care provider.  Keep all follow-up visits as told by your health care provider. This is important. Medicines  Take over-the-counter and prescription medicines only as told by your health care provider. Follow directions carefully. Blood pressure medicines must be taken as prescribed.  Do not skip doses of blood pressure medicine. Doing this puts you at risk for problems and   can make the medicine less effective.  Ask your health care provider about side effects or reactions to medicines that you should watch for. Contact a health care provider if:  You think you are having a reaction to a medicine you are taking.  You have headaches that keep coming back (recurring).  You feel dizzy.  You have swelling in your ankles.  You have trouble with your vision. Get help right away if:  You develop a severe headache or confusion.  You have unusual weakness or numbness.  You feel faint.  You have severe pain in your chest or abdomen.  You vomit repeatedly.  You have trouble breathing. Summary  Hypertension is when the force of blood pumping through your arteries is too strong. If this condition is not controlled, it may put you at risk for serious  complications.  Your personal target blood pressure may vary depending on your medical conditions, your age, and other factors. For most people, a normal blood pressure is less than 120/80.  Hypertension is treated with lifestyle changes, medicines, or a combination of both. Lifestyle changes include weight loss, eating a healthy, low-sodium diet, exercising more, and limiting alcohol. This information is not intended to replace advice given to you by your health care provider. Make sure you discuss any questions you have with your health care provider. Document Released: 03/08/2005 Document Revised: 02/04/2016 Document Reviewed: 02/04/2016 Elsevier Interactive Patient Education  2018 [employer]  

## 2017-11-11 ENCOUNTER — Telehealth: Payer: Self-pay | Admitting: Emergency Medicine

## 2017-11-11 NOTE — Telephone Encounter (Signed)
Called and left VM for pt to call the office and reschedule their appt due to Dr. Mitchel Honour being out of the office that day.  Thank you!

## 2018-03-17 ENCOUNTER — Ambulatory Visit: Payer: Medicare Other | Admitting: Emergency Medicine

## 2018-04-07 ENCOUNTER — Other Ambulatory Visit: Payer: Self-pay

## 2018-04-07 ENCOUNTER — Encounter: Payer: Self-pay | Admitting: Emergency Medicine

## 2018-04-07 ENCOUNTER — Ambulatory Visit (INDEPENDENT_AMBULATORY_CARE_PROVIDER_SITE_OTHER): Payer: Medicare Other | Admitting: Emergency Medicine

## 2018-04-07 VITALS — BP 115/73 | HR 55 | Temp 98.5°F | Resp 16 | Ht 69.0 in | Wt 207.0 lb

## 2018-04-07 DIAGNOSIS — I10 Essential (primary) hypertension: Secondary | ICD-10-CM | POA: Diagnosis not present

## 2018-04-07 DIAGNOSIS — F329 Major depressive disorder, single episode, unspecified: Secondary | ICD-10-CM

## 2018-04-07 DIAGNOSIS — F32A Depression, unspecified: Secondary | ICD-10-CM

## 2018-04-07 NOTE — Patient Instructions (Addendum)
If you have lab work done today you will be contacted with your lab results within the next 2 weeks.  If you have not heard from [place] then please contact us. The fastest way to get your results is to register for My Chart.   IF you received an x-ray today, you will receive an invoice from [facility]. Please contact [facility] at [phone] with questions or concerns regarding your invoice.   IF you received labwork today, you will receive an invoice from [facility]. Please contact [facility] at [phone] with questions or concerns regarding your invoice.   Our billing staff will not be able to assist you with questions regarding bills from these companies.  You will be contacted with the lab results as soon as they are available. The fastest way to get your results is to activate your My Chart account. Instructions are located on the last page of this paperwork. If you have not heard from [place] regarding the results in 2 weeks, please contact this office.     Health Maintenance, Male A healthy lifestyle and preventive care is important for your health and wellness. Ask your health care provider about what schedule of regular examinations is right for you. What should I know about weight and diet? Eat a Healthy Diet  Eat plenty of vegetables, fruits, whole grains, low-fat dairy products, and lean protein.  Do not eat a lot of foods high in solid fats, added sugars, or salt.  Maintain a Healthy Weight Regular exercise can help you achieve or maintain a healthy weight. You should:  Do at least 150 minutes of exercise each week. The exercise should increase your heart rate and make you sweat (moderate-intensity exercise).  Do strength-training exercises at least twice a week. Watch Your Levels of Cholesterol and Blood Lipids  Have your blood tested for lipids and cholesterol every 5 years starting at 68 years of age. If you are at high risk for heart disease, you  should start having your blood tested when you are 68 years old. You may need to have your cholesterol levels checked more often if: ? Your lipid or cholesterol levels are high. ? You are older than 68 years of age. ? You are at high risk for heart disease. What should I know about cancer screening? Many types of cancers can be detected early and may often be prevented. Lung Cancer  You should be screened every year for lung cancer if: ? You are a current smoker who has smoked for at least 30 years. ? You are a former smoker who has quit within the past 15 years.  Talk to your health care provider about your screening options, when you should start screening, and how often you should be screened. Colorectal Cancer  Routine colorectal cancer screening usually begins at 68 years of age and should be repeated every 5-10 years until you are 68 years old. You may need to be screened more often if early forms of precancerous polyps or small growths are found. Your health care provider may recommend screening at an earlier age if you have risk factors for colon cancer.  Your health care provider may recommend using home test kits to check for hidden blood in the stool.  A small camera at the end of a tube can be used to examine your colon (sigmoidoscopy or colonoscopy). This checks for the earliest forms of colorectal cancer. Prostate and Testicular Cancer  Depending on your age and overall  health, your health care provider may do certain tests to screen for prostate and testicular cancer.  Talk to your health care provider about any symptoms or concerns you have about testicular or prostate cancer. Skin Cancer  Check your skin from head to toe regularly.  Tell your health care provider about any new moles or changes in moles, especially if: ? There is a change in a mole's size, shape, or color. ? You have a mole that is larger than a pencil eraser.  Always use sunscreen. Apply sunscreen  liberally and repeat throughout the day.  Protect yourself by wearing long sleeves, pants, a wide-brimmed hat, and sunglasses when outside. What should I know about heart disease, diabetes, and high blood pressure?  If you are 36-14 years of age, have your blood pressure checked every 3-5 years. If you are 32 years of age or older, have your blood pressure checked every year. You should have your blood pressure measured twice-once when you are at a hospital or clinic, and once when you are not at a hospital or clinic. Record the average of the two measurements. To check your blood pressure when you are not at a hospital or clinic, you can use: ? An automated blood pressure machine at a pharmacy. ? A home blood pressure monitor.  Talk to your health care provider about your target blood pressure.  If you are between 72-60 years old, ask your health care provider if you should take aspirin to prevent heart disease.  Have regular diabetes screenings by checking your fasting blood sugar level. ? If you are at a normal weight and have a low risk for diabetes, have this test once every three years after the age of 8. ? If you are overweight and have a high risk for diabetes, consider being tested at a younger age or more often.  A one-time screening for abdominal aortic aneurysm (AAA) by ultrasound is recommended for men aged 7-75 years who are current or former smokers. What should I know about preventing infection? Hepatitis B If you have a higher risk for hepatitis B, you should be screened for this virus. Talk with your health care provider to find out if you are at risk for hepatitis B infection. Hepatitis C Blood testing is recommended for:  Everyone born from [birth year] through 1965.  Anyone with known risk factors for hepatitis C. Sexually Transmitted Diseases (STDs)  You should be screened each year for STDs including gonorrhea and chlamydia if: ? You are sexually active and are younger  than 68 years of age. ? You are older than 68 years of age and your health care provider tells you that you are at risk for this type of infection. ? Your sexual activity has changed since you were last screened and you are at an increased risk for chlamydia or gonorrhea. Ask your health care provider if you are at risk.  Talk with your health care provider about whether you are at high risk of being infected with HIV. Your health care provider may recommend a prescription medicine to help prevent HIV infection. What else can I do?  Schedule regular health, dental, and eye exams.  Stay current with your vaccines (immunizations).  Do not use any tobacco products, such as cigarettes, chewing tobacco, and e-cigarettes. If you need help quitting, ask your health care provider.  Limit alcohol intake to no more than 2 drinks per day. One drink equals 12 ounces of beer, 5 ounces of wine, or  1 ounces of hard liquor.  Do not use street drugs.  Do not share needles.  Ask your health care provider for help if you need support or information about quitting drugs.  Tell your health care provider if you often feel depressed.  Tell your health care provider if you have ever been abused or do not feel safe at home. This information is not intended to replace advice given to you by your health care provider. Make sure you discuss any questions you have with your health care provider. Document Released: 09/04/2007 Document Revised: 11/05/2015 Document Reviewed: 12/10/2014 Elsevier Interactive Patient Education  2019 [employer].  Hypertension Hypertension, commonly called high blood pressure, is when the force of blood pumping through the arteries is too strong. The arteries are the blood vessels that carry blood from the heart throughout the body. Hypertension forces the heart to work harder to pump blood and may cause arteries to become narrow or stiff. Having untreated or uncontrolled hypertension  can cause heart attacks, strokes, kidney disease, and other problems. A blood pressure reading consists of a higher number over a lower number. Ideally, your blood pressure should be below 120/80. The first ("top") number is called the systolic pressure. It is a measure of the pressure in your arteries as your heart beats. The second ("bottom") number is called the diastolic pressure. It is a measure of the pressure in your arteries as the heart relaxes. What are the causes? The cause of this condition is not known. What increases the risk? Some risk factors for high blood pressure are under your control. Others are not. Factors you can change  Smoking.  Having type 2 diabetes mellitus, high cholesterol, or both.  Not getting enough exercise or physical activity.  Being overweight.  Having too much fat, sugar, calories, or salt (sodium) in your diet.  Drinking too much alcohol. Factors that are difficult or impossible to change  Having chronic kidney disease.  Having a family history of high blood pressure.  Age. Risk increases with age.  Race. You may be at higher risk if you are African-American.  Gender. Men are at higher risk than women before age 75. After age 56, women are at higher risk than men.  Having obstructive sleep apnea.  Stress. What are the signs or symptoms? Extremely high blood pressure (hypertensive crisis) may cause:  Headache.  Anxiety.  Shortness of breath.  Nosebleed.  Nausea and vomiting.  Severe chest pain.  Jerky movements you cannot control (seizures). How is this diagnosed? This condition is diagnosed by measuring your blood pressure while you are seated, with your arm resting on a surface. The cuff of the blood pressure monitor will be placed directly against the skin of your upper arm at the level of your heart. It should be measured at least twice using the same arm. Certain conditions can cause a difference in blood pressure between  your right and left arms. Certain factors can cause blood pressure readings to be lower or higher than normal (elevated) for a short period of time:  When your blood pressure is higher when you are in a health care provider's office than when you are at home, this is called white coat hypertension. Most people with this condition do not need medicines.  When your blood pressure is higher at home than when you are in a health care provider's office, this is called masked hypertension. Most people with this condition may need medicines to control blood pressure. If  you have a high blood pressure reading during one visit or you have normal blood pressure with other risk factors:  You may be asked to return on a different day to have your blood pressure checked again.  You may be asked to monitor your blood pressure at home for 1 week or longer. If you are diagnosed with hypertension, you may have other blood or imaging tests to help your health care provider understand your overall risk for other conditions. How is this treated? This condition is treated by making healthy lifestyle changes, such as eating healthy foods, exercising more, and reducing your alcohol intake. Your health care provider may prescribe medicine if lifestyle changes are not enough to get your blood pressure under control, and if:  Your systolic blood pressure is above 130.  Your diastolic blood pressure is above 80. Your personal target blood pressure may vary depending on your medical conditions, your age, and other factors. Follow these instructions at home: Eating and drinking   Eat a diet that is high in fiber and potassium, and low in sodium, added sugar, and fat. An example eating plan is called the DASH (Dietary Approaches to Stop Hypertension) diet. To eat this way: ? Eat plenty of fresh fruits and vegetables. Try to fill half of your plate at each meal with fruits and vegetables. ? Eat whole grains, such as whole  wheat pasta, brown rice, or whole grain bread. Fill about one quarter of your plate with whole grains. ? Eat or drink low-fat dairy products, such as skim milk or low-fat yogurt. ? Avoid fatty cuts of meat, processed or cured meats, and poultry with skin. Fill about one quarter of your plate with lean proteins, such as fish, chicken without skin, beans, eggs, and tofu. ? Avoid premade and processed foods. These tend to be higher in sodium, added sugar, and fat.  Reduce your daily sodium intake. Most people with hypertension should eat less than 1,500 mg of sodium a day.  Limit alcohol intake to no more than 1 drink a day for nonpregnant women and 2 drinks a day for men. One drink equals 12 oz of beer, 5 oz of wine, or 1 oz of hard liquor. Lifestyle   Work with your health care provider to maintain a healthy body weight or to lose weight. Ask what an ideal weight is for you.  Get at least 30 minutes of exercise that causes your heart to beat faster (aerobic exercise) most days of the week. Activities may include walking, swimming, or biking.  Include exercise to strengthen your muscles (resistance exercise), such as pilates or lifting weights, as part of your weekly exercise routine. Try to do these types of exercises for 30 minutes at least 3 days a week.  Do not use any products that contain nicotine or tobacco, such as cigarettes and e-cigarettes. If you need help quitting, ask your health care provider.  Monitor your blood pressure at home as told by your health care provider.  Keep all follow-up visits as told by your health care provider. This is important. Medicines  Take over-the-counter and prescription medicines only as told by your health care provider. Follow directions carefully. Blood pressure medicines must be taken as prescribed.  Do not skip doses of blood pressure medicine. Doing this puts you at risk for problems and can make the medicine less effective.  Ask your  health care provider about side effects or reactions to medicines that you should watch for. Contact a  health care provider if:  You think you are having a reaction to a medicine you are taking.  You have headaches that keep coming back (recurring).  You feel dizzy.  You have swelling in your ankles.  You have trouble with your vision. Get help right away if:  You develop a severe headache or confusion.  You have unusual weakness or numbness.  You feel faint.  You have severe pain in your chest or abdomen.  You vomit repeatedly.  You have trouble breathing. Summary  Hypertension is when the force of blood pumping through your arteries is too strong. If this condition is not controlled, it may put you at risk for serious complications.  Your personal target blood pressure may vary depending on your medical conditions, your age, and other factors. For most people, a normal blood pressure is less than 120/80.  Hypertension is treated with lifestyle changes, medicines, or a combination of both. Lifestyle changes include weight loss, eating a healthy, low-sodium diet, exercising more, and limiting alcohol. This information is not intended to replace advice given to you by your health care provider. Make sure you discuss any questions you have with your health care provider. Document Released: 03/08/2005 Document Revised: 02/04/2016 Document Reviewed: 02/04/2016 Elsevier Interactive Patient Education  2019 [employer].

## 2018-04-07 NOTE — Progress Notes (Signed)
Christopher [name] 68 y.o.   Chief Complaint  Patient presents with  . Establish Care    depression   . Hypertension   Depression screen [facility] 2/9 04/07/2018 09/10/2017 08/26/2016 08/26/2016 06/04/2015  Decreased Interest 0 0 0 0 0  Down, Depressed, Hopeless 0 0 0 0 0  PHQ - 2 Score 0 0 0 0 0    HISTORY OF PRESENT ILLNESS: This is a 69 y.o. male here for follow-up of hypertension and depression.  Doing well.  On medication.  No refills needed.  Has no complaints or medical concerns today.  HPI   Prior to Admission medications   Medication Sig Start Date End Date Taking? Authorizing Provider  ALPRAZolam Duanne Moron) 0.5 MG tablet Take 1 tablet (0.5 mg total) by mouth 2 (two) times daily as needed for anxiety. 09/10/17  Yes Tametha Banning, Ines Bloomer, MD  amLODipine (NORVASC) 5 MG tablet Take 1 tablet (5 mg total) by mouth daily. 09/10/17  Yes Warnie Belair, Ines Bloomer, MD  lisinopril (PRINIVIL,ZESTRIL) 10 MG tablet One tablet daily 09/10/17  Yes Rooney Gladwin, Ines Bloomer, MD  PARoxetine (PAXIL) 20 MG tablet Take 1 tablet (20 mg total) by mouth daily. 09/10/17  Yes Horald Pollen, MD  tamsulosin [facility]) 0.4 MG CAPS capsule  04/12/15  Yes [provider]    No Known Allergies  Patient Active Problem List   Diagnosis Date Noted  . Depression 08/26/2016  . 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 H. pylori infection-treated 2010 02/06/2013  . HTN (hypertension) 11/21/2011  . Anger 11/21/2011    Past Medical History:  Diagnosis Date  . Anxiety   . Cancer ([place])   . Depression   . Hypertension     Past Surgical History:  Procedure Laterality Date  . BLADDER SURGERY    . JOINT REPLACEMENT  2016    Social History   Socioeconomic History  . Marital status: Single    Spouse name: Not on file  . Number of children: Not on file  . Years of education: Not on file  . Highest education level: Not on file  Occupational History  . Not on file  Social Needs  .  Financial resource strain: Not on file  . Food insecurity:    Worry: Not on file    Inability: Not on file  . Transportation needs:    Medical: Not on file    Non-medical: Not on file  Tobacco Use  . Smoking status: Former Smoker    Last attempt to quit: 03/22/1972    Years since quitting: 46.0  . Smokeless tobacco: Never Used  Substance and Sexual Activity  . Alcohol use: Yes    Alcohol/week: 0.0 standard drinks    Comment: rare  . Drug use: No  . Sexual activity: Not on file  Lifestyle  . Physical activity:    Days per week: Not on file    Minutes per session: Not on file  . Stress: Not on file  Relationships  . Social connections:    Talks on phone: Not on file    Gets together: Not on file    Attends religious service: Not on file    Active member of club or organization: Not on file    Attends meetings of clubs or organizations: Not on file    Relationship status: Not on file  . Intimate partner violence:    Fear of current or ex partner: Not on file    Emotionally abused: Not on  file    Physically abused: Not on file    Forced sexual activity: Not on file  Other Topics Concern  . Not on file  Social History Narrative  . Not on file    Family History  Problem Relation Age of Onset  . Cancer Mother   . Cancer Father   . Hypertension Father   . Heart disease Father   . Cancer Sister   . Diabetes Sister   . Heart disease Sister   . Hypertension Sister   . Mental illness Brother   . Colon cancer Neg Hx      Review of Systems  Constitutional: Negative.  Negative for chills and fever.  HENT: Negative.   Eyes: Negative.  Negative for blurred vision.  Respiratory: Negative.  Negative for cough, hemoptysis, shortness of breath and wheezing.   Cardiovascular: Negative.  Negative for chest pain, palpitations and leg swelling.  Gastrointestinal: Negative.  Negative for abdominal pain, nausea and vomiting.  Genitourinary: Negative.   Musculoskeletal: Negative.     Skin: Negative.   Neurological: Negative.  Negative for dizziness and headaches.  Endo/Heme/Allergies: Negative.   All other systems reviewed and are negative.  Vitals:   04/07/18 1551  BP: 115/73  Pulse: (!) 55  Resp: 16  Temp: 98.5 F (36.9 C)  SpO2: 97%     Physical Exam Vitals signs reviewed.  Constitutional:      Appearance: Normal appearance.  HENT:     Head: Normocephalic and atraumatic.     Nose: Nose normal.     Mouth/Throat:     Mouth: Mucous membranes are moist.     Pharynx: Oropharynx is clear.  Eyes:     Extraocular Movements: Extraocular movements intact.     Conjunctiva/sclera: Conjunctivae normal.     Pupils: Pupils are equal, round, and reactive to light.  Neck:     Musculoskeletal: Normal range of motion and neck supple.  Cardiovascular:     Rate and Rhythm: Normal rate and regular rhythm.     Pulses: Normal pulses.     Heart sounds: Normal heart sounds.  Pulmonary:     Effort: Pulmonary effort is normal.     Breath sounds: Normal breath sounds.  Musculoskeletal: Normal range of motion.  Skin:    General: Skin is warm and dry.     Capillary Refill: Capillary refill takes less than 2 seconds.  Neurological:     General: No focal deficit present.     Mental Status: He is alert and oriented to person, place, and time.  Psychiatric:        Mood and Affect: Mood normal.        Behavior: Behavior normal.    A total of 25 minutes was spent in the room with the patient, greater than 50% of which was in counseling/coordination of care regarding chronic medical problems, treatment, medications, and need for follow-up.   ASSESSMENT & PLAN: Calel was seen today for establish care and hypertension.  Diagnoses and all orders for this visit:  Essential hypertension  Depression, unspecified depression type    Patient Instructions       If you have lab work done today you will be contacted with your lab results within the next 2 weeks.  If you  have not heard from [place] then please contact us. The fastest way to get your results is to register for My Chart.   IF you received an x-ray today, you will receive an invoice from [facility].  Please contact [facility] at [phone] with questions or concerns regarding your invoice.   IF you received labwork today, you will receive an invoice from [place]. Please contact [facility] at [phone] with questions or concerns regarding your invoice.   Our billing staff will not be able to assist you with questions regarding bills from these companies.  You will be contacted with the lab results as soon as they are available. The fastest way to get your results is to activate your My Chart account. Instructions are located on the last page of this paperwork. If you have not heard from [place] regarding the results in 2 weeks, please contact this office.     Health Maintenance, Male A healthy lifestyle and preventive care is important for your health and wellness. Ask your health care provider about what schedule of regular examinations is right for you. What should I know about weight and diet? Eat a Healthy Diet  Eat plenty of vegetables, fruits, whole grains, low-fat dairy products, and lean protein.  Do not eat a lot of foods high in solid fats, added sugars, or salt.  Maintain a Healthy Weight Regular exercise can help you achieve or maintain a healthy weight. You should:  Do at least 150 minutes of exercise each week. The exercise should increase your heart rate and make you sweat (moderate-intensity exercise).  Do strength-training exercises at least twice a week. Watch Your Levels of Cholesterol and Blood Lipids  Have your blood tested for lipids and cholesterol every 5 years starting at 68 years of age. If you are at high risk for heart disease, you should start having your blood tested when you are 68 years old. You may need to have your cholesterol levels checked  more often if: ? Your lipid or cholesterol levels are high. ? You are older than 68 years of age. ? You are at high risk for heart disease. What should I know about cancer screening? Many types of cancers can be detected early and may often be prevented. Lung Cancer  You should be screened every year for lung cancer if: ? You are a current smoker who has smoked for at least 30 years. ? You are a former smoker who has quit within the past 15 years.  Talk to your health care provider about your screening options, when you should start screening, and how often you should be screened. Colorectal Cancer  Routine colorectal cancer screening usually begins at 68 years of age and should be repeated every 5-10 years until you are 68 years old. You may need to be screened more often if early forms of precancerous polyps or small growths are found. Your health care provider may recommend screening at an earlier age if you have risk factors for colon cancer.  Your health care provider may recommend using home test kits to check for hidden blood in the stool.  A small camera at the end of a tube can be used to examine your colon (sigmoidoscopy or colonoscopy). This checks for the earliest forms of colorectal cancer. Prostate and Testicular Cancer  Depending on your age and overall health, your health care provider may do certain tests to screen for prostate and testicular cancer.  Talk to your health care provider about any symptoms or concerns you have about testicular or prostate cancer. Skin Cancer  Check your skin from head to toe regularly.  Tell your health care provider about any new moles or changes in moles, especially if: ? There  is a change in a mole's size, shape, or color. ? You have a mole that is larger than a pencil eraser.  Always use sunscreen. Apply sunscreen liberally and repeat throughout the day.  Protect yourself by wearing long sleeves, pants, a wide-brimmed hat, and  sunglasses when outside. What should I know about heart disease, diabetes, and high blood pressure?  If you are 38-7 years of age, have your blood pressure checked every 3-5 years. If you are 71 years of age or older, have your blood pressure checked every year. You should have your blood pressure measured twice-once when you are at a hospital or clinic, and once when you are not at a hospital or clinic. Record the average of the two measurements. To check your blood pressure when you are not at a hospital or clinic, you can use: ? An automated blood pressure machine at a pharmacy. ? A home blood pressure monitor.  Talk to your health care provider about your target blood pressure.  If you are between 71-23 years old, ask your health care provider if you should take aspirin to prevent heart disease.  Have regular diabetes screenings by checking your fasting blood sugar level. ? If you are at a normal weight and have a low risk for diabetes, have this test once every three years after the age of 68. ? If you are overweight and have a high risk for diabetes, consider being tested at a younger age or more often.  A one-time screening for abdominal aortic aneurysm (AAA) by ultrasound is recommended for men aged 36-75 years who are current or former smokers. What should I know about preventing infection? Hepatitis B If you have a higher risk for hepatitis B, you should be screened for this virus. Talk with your health care provider to find out if you are at risk for hepatitis B infection. Hepatitis C Blood testing is recommended for:  Everyone born from [birth year] through 1965.  Anyone with known risk factors for hepatitis C. Sexually Transmitted Diseases (STDs)  You should be screened each year for STDs including gonorrhea and chlamydia if: ? You are sexually active and are younger than 68 years of age. ? You are older than 68 years of age and your health care provider tells you that you are at  risk for this type of infection. ? Your sexual activity has changed since you were last screened and you are at an increased risk for chlamydia or gonorrhea. Ask your health care provider if you are at risk.  Talk with your health care provider about whether you are at high risk of being infected with HIV. Your health care provider may recommend a prescription medicine to help prevent HIV infection. What else can I do?  Schedule regular health, dental, and eye exams.  Stay current with your vaccines (immunizations).  Do not use any tobacco products, such as cigarettes, chewing tobacco, and e-cigarettes. If you need help quitting, ask your health care provider.  Limit alcohol intake to no more than 2 drinks per day. One drink equals 12 ounces of beer, 5 ounces of wine, or 1 ounces of hard liquor.  Do not use street drugs.  Do not share needles.  Ask your health care provider for help if you need support or information about quitting drugs.  Tell your health care provider if you often feel depressed.  Tell your health care provider if you have ever been abused or do not feel safe at home. This  information is not intended to replace advice given to you by your health care provider. Make sure you discuss any questions you have with your health care provider. Document Released: 09/04/2007 Document Revised: 11/05/2015 Document Reviewed: 12/10/2014 Elsevier Interactive Patient Education  2019 [employer].  Hypertension Hypertension, commonly called high blood pressure, is when the force of blood pumping through the arteries is too strong. The arteries are the blood vessels that carry blood from the heart throughout the body. Hypertension forces the heart to work harder to pump blood and may cause arteries to become narrow or stiff. Having untreated or uncontrolled hypertension can cause heart attacks, strokes, kidney disease, and other problems. A blood pressure reading consists of a higher  number over a lower number. Ideally, your blood pressure should be below 120/80. The first ("top") number is called the systolic pressure. It is a measure of the pressure in your arteries as your heart beats. The second ("bottom") number is called the diastolic pressure. It is a measure of the pressure in your arteries as the heart relaxes. What are the causes? The cause of this condition is not known. What increases the risk? Some risk factors for high blood pressure are under your control. Others are not. Factors you can change  Smoking.  Having type 2 diabetes mellitus, high cholesterol, or both.  Not getting enough exercise or physical activity.  Being overweight.  Having too much fat, sugar, calories, or salt (sodium) in your diet.  Drinking too much alcohol. Factors that are difficult or impossible to change  Having chronic kidney disease.  Having a family history of high blood pressure.  Age. Risk increases with age.  Race. You may be at higher risk if you are African-American.  Gender. Men are at higher risk than women before age 51. After age 6, women are at higher risk than men.  Having obstructive sleep apnea.  Stress. What are the signs or symptoms? Extremely high blood pressure (hypertensive crisis) may cause:  Headache.  Anxiety.  Shortness of breath.  Nosebleed.  Nausea and vomiting.  Severe chest pain.  Jerky movements you cannot control (seizures). How is this diagnosed? This condition is diagnosed by measuring your blood pressure while you are seated, with your arm resting on a surface. The cuff of the blood pressure monitor will be placed directly against the skin of your upper arm at the level of your heart. It should be measured at least twice using the same arm. Certain conditions can cause a difference in blood pressure between your right and left arms. Certain factors can cause blood pressure readings to be lower or higher than normal  (elevated) for a short period of time:  When your blood pressure is higher when you are in a health care provider's office than when you are at home, this is called white coat hypertension. Most people with this condition do not need medicines.  When your blood pressure is higher at home than when you are in a health care provider's office, this is called masked hypertension. Most people with this condition may need medicines to control blood pressure. If you have a high blood pressure reading during one visit or you have normal blood pressure with other risk factors:  You may be asked to return on a different day to have your blood pressure checked again.  You may be asked to monitor your blood pressure at home for 1 week or longer. If you are diagnosed with hypertension, you may have  other blood or imaging tests to help your health care provider understand your overall risk for other conditions. How is this treated? This condition is treated by making healthy lifestyle changes, such as eating healthy foods, exercising more, and reducing your alcohol intake. Your health care provider may prescribe medicine if lifestyle changes are not enough to get your blood pressure under control, and if:  Your systolic blood pressure is above 130.  Your diastolic blood pressure is above 80. Your personal target blood pressure may vary depending on your medical conditions, your age, and other factors. Follow these instructions at home: Eating and drinking   Eat a diet that is high in fiber and potassium, and low in sodium, added sugar, and fat. An example eating plan is called the DASH (Dietary Approaches to Stop Hypertension) diet. To eat this way: ? Eat plenty of fresh fruits and vegetables. Try to fill half of your plate at each meal with fruits and vegetables. ? Eat whole grains, such as whole wheat pasta, brown rice, or whole grain bread. Fill about one quarter of your plate with whole grains. ? Eat  or drink low-fat dairy products, such as skim milk or low-fat yogurt. ? Avoid fatty cuts of meat, processed or cured meats, and poultry with skin. Fill about one quarter of your plate with lean proteins, such as fish, chicken without skin, beans, eggs, and tofu. ? Avoid premade and processed foods. These tend to be higher in sodium, added sugar, and fat.  Reduce your daily sodium intake. Most people with hypertension should eat less than 1,500 mg of sodium a day.  Limit alcohol intake to no more than 1 drink a day for nonpregnant women and 2 drinks a day for men. One drink equals 12 oz of beer, 5 oz of wine, or 1 oz of hard liquor. Lifestyle   Work with your health care provider to maintain a healthy body weight or to lose weight. Ask what an ideal weight is for you.  Get at least 30 minutes of exercise that causes your heart to beat faster (aerobic exercise) most days of the week. Activities may include walking, swimming, or biking.  Include exercise to strengthen your muscles (resistance exercise), such as pilates or lifting weights, as part of your weekly exercise routine. Try to do these types of exercises for 30 minutes at least 3 days a week.  Do not use any products that contain nicotine or tobacco, such as cigarettes and e-cigarettes. If you need help quitting, ask your health care provider.  Monitor your blood pressure at home as told by your health care provider.  Keep all follow-up visits as told by your health care provider. This is important. Medicines  Take over-the-counter and prescription medicines only as told by your health care provider. Follow directions carefully. Blood pressure medicines must be taken as prescribed.  Do not skip doses of blood pressure medicine. Doing this puts you at risk for problems and can make the medicine less effective.  Ask your health care provider about side effects or reactions to medicines that you should watch for. Contact a health care  provider if:  You think you are having a reaction to a medicine you are taking.  You have headaches that keep coming back (recurring).  You feel dizzy.  You have swelling in your ankles.  You have trouble with your vision. Get help right away if:  You develop a severe headache or confusion.  You have unusual weakness or  numbness.  You feel faint.  You have severe pain in your chest or abdomen.  You vomit repeatedly.  You have trouble breathing. Summary  Hypertension is when the force of blood pumping through your arteries is too strong. If this condition is not controlled, it may put you at risk for serious complications.  Your personal target blood pressure may vary depending on your medical conditions, your age, and other factors. For most people, a normal blood pressure is less than 120/80.  Hypertension is treated with lifestyle changes, medicines, or a combination of both. Lifestyle changes include weight loss, eating a healthy, low-sodium diet, exercising more, and limiting alcohol. This information is not intended to replace advice given to you by your health care provider. Make sure you discuss any questions you have with your health care provider. Document Released: 03/08/2005 Document Revised: 02/04/2016 Document Reviewed: 02/04/2016 Elsevier Interactive Patient Education  2019 [employer] Inc[employer]      Agustina Caroli, MD [facility]

## 2018-09-04 ENCOUNTER — Other Ambulatory Visit: Payer: Self-pay | Admitting: Emergency Medicine

## 2018-09-04 DIAGNOSIS — F32A Depression, unspecified: Secondary | ICD-10-CM

## 2018-09-04 DIAGNOSIS — F329 Major depressive disorder, single episode, unspecified: Secondary | ICD-10-CM

## 2018-09-04 DIAGNOSIS — I10 Essential (primary) hypertension: Secondary | ICD-10-CM

## 2018-10-11 ENCOUNTER — Other Ambulatory Visit: Payer: Self-pay

## 2018-10-11 ENCOUNTER — Ambulatory Visit (INDEPENDENT_AMBULATORY_CARE_PROVIDER_SITE_OTHER): Payer: Medicare Other | Admitting: Emergency Medicine

## 2018-10-11 ENCOUNTER — Encounter: Payer: Self-pay | Admitting: Emergency Medicine

## 2018-10-11 VITALS — BP 108/67 | HR 58 | Temp 98.3°F | Resp 18 | Ht 69.0 in | Wt 210.8 lb

## 2018-10-11 DIAGNOSIS — I10 Essential (primary) hypertension: Secondary | ICD-10-CM | POA: Diagnosis not present

## 2018-10-11 DIAGNOSIS — Z23 Encounter for immunization: Secondary | ICD-10-CM | POA: Diagnosis not present

## 2018-10-11 DIAGNOSIS — F325 Major depressive disorder, single episode, in full remission: Secondary | ICD-10-CM | POA: Diagnosis not present

## 2018-10-11 MED ORDER — PAROXETINE HCL 20 MG PO TABS: 20.0000 mg | ORAL_TABLET | Freq: Every day | ORAL | 3 refills | Status: DC

## 2018-10-11 MED ORDER — AMLODIPINE BESYLATE 5 MG PO TABS: 5.0000 mg | ORAL_TABLET | Freq: Every day | ORAL | 3 refills | Status: DC

## 2018-10-11 MED ORDER — LISINOPRIL 10 MG PO TABS: ORAL_TABLET | ORAL | 3 refills | Status: DC

## 2018-10-11 NOTE — Patient Instructions (Signed)

## 2018-10-11 NOTE — Progress Notes (Signed)
Christopher [name] 68 y.o.   Chief Complaint  Patient presents with  . Hypertension    6 mnth f/u  . Depression    6 mnth f/u   BP Readings from Last 3 Encounters:  10/11/18 108/67  04/07/18 115/73  08/26/16 (!) 150/76   Depression screen PHQ 2/9 10/11/2018 04/07/2018 09/10/2017 08/26/2016 08/26/2016  Decreased Interest 0 0 0 0 0  Down, Depressed, Hopeless 0 0 0 0 0  PHQ - 2 Score 0 0 0 0 0  Altered sleeping 0 - - - -  Tired, decreased energy 0 - - - -  Change in appetite 0 - - - -  Feeling bad or failure about yourself  0 - - - -  Trouble concentrating 0 - - - -  Moving slowly or fidgety/restless 0 - - - -  Suicidal thoughts 0 - - - -  PHQ-9 Score 0 - - - -  Difficult doing work/chores Somewhat difficult - - - -     HISTORY OF PRESENT ILLNESS: This is a 68 y.o. male with history of hypertension and depression here for follow-up.  Doing well.  Has no complaints or medical concerns today.  Needs medication refills on amlodipine, lisinopril, and Paxil.  HPI   Prior to Admission medications   Medication Sig Start Date End Date Taking? Authorizing Provider  ALPRAZolam Duanne Moron) 0.5 MG tablet Take 1 tablet (0.5 mg total) by mouth 2 (two) times daily as needed for anxiety. 09/10/17  Yes Horald Pollen, MD  amLODipine (NORVASC) 5 MG tablet TAKE 1 TABLET BY MOUTH DAILY 09/04/18  Yes Horald Pollen, MD  lisinopril (ZESTRIL) 10 MG tablet TAKE 1 TABLET BY MOUTH DAILY 09/04/18  Yes Horald Pollen, MD  PARoxetine (PAXIL) 20 MG tablet TAKE 1 TABLET BY MOUTH ONCE DAILY 09/04/18  Yes Horald Pollen, MD  tamsulosin [facility]) 0.4 MG CAPS capsule  04/12/15  Yes [provider]    No Known Allergies  Patient Active Problem List   Diagnosis Date Noted  . Depression 08/26/2016  . 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 H. pylori infection-treated 2010 02/06/2013  . HTN (hypertension) 11/21/2011  . Anger 11/21/2011    Past Medical History:   Diagnosis Date  . Anxiety   . Cancer ([place])   . Depression   . Hypertension     Past Surgical History:  Procedure Laterality Date  . BLADDER SURGERY    . JOINT REPLACEMENT  2016    Social History   Socioeconomic History  . Marital status: Single    Spouse name: Not on file  . Number of children: Not on file  . Years of education: Not on file  . Highest education level: Not on file  Occupational History  . Not on file  Social Needs  . Financial resource strain: Not on file  . Food insecurity    Worry: Not on file    Inability: Not on file  . Transportation needs    Medical: Not on file    Non-medical: Not on file  Tobacco Use  . Smoking status: Former Smoker    Quit date: 03/22/1972    Years since quitting: 46.5  . Smokeless tobacco: Never Used  Substance and Sexual Activity  . Alcohol use: Yes    Alcohol/week: 0.0 standard drinks    Comment: rare  . Drug use: No  . Sexual activity: Not on file  Lifestyle  . Physical activity    Days  per week: Not on file    Minutes per session: Not on file  . Stress: Not on file  Relationships  . Social Herbalist on phone: Not on file    Gets together: Not on file    Attends religious service: Not on file    Active member of club or organization: Not on file    Attends meetings of clubs or organizations: Not on file    Relationship status: Not on file  . Intimate partner violence    Fear of current or ex partner: Not on file    Emotionally abused: Not on file    Physically abused: Not on file    Forced sexual activity: Not on file  Other Topics Concern  . Not on file  Social History Narrative  . Not on file    Family History  Problem Relation Age of Onset  . Cancer Mother   . Cancer Father   . Hypertension Father   . Heart disease Father   . Cancer Sister   . Diabetes Sister   . Heart disease Sister   . Hypertension Sister   . Mental illness Brother   . Colon cancer Neg Hx      Review of  Systems  Constitutional: Negative.  Negative for chills and fever.  HENT: Negative.  Negative for congestion and sore throat.   Eyes: Negative.   Respiratory: Negative.  Negative for cough and shortness of breath.   Cardiovascular: Negative.  Negative for chest pain and palpitations.  Gastrointestinal: Negative.  Negative for abdominal pain, diarrhea, nausea and vomiting.  Genitourinary: Negative.  Negative for dysuria and hematuria.  Musculoskeletal: Negative.  Negative for back pain, myalgias and neck pain.  Skin: Negative.  Negative for rash.  Neurological: Negative.  Negative for dizziness and headaches.  Endo/Heme/Allergies: Negative.   All other systems reviewed and are negative.  Vitals:   10/11/18 0954  BP: 108/67  Pulse: (!) 58  Resp: 18  Temp: 98.3 F (36.8 C)  SpO2: 97%     Physical Exam Vitals signs reviewed.  Constitutional:      Appearance: Normal appearance.  HENT:     Head: Normocephalic and atraumatic.  Eyes:     Extraocular Movements: Extraocular movements intact.     Conjunctiva/sclera: Conjunctivae normal.     Pupils: Pupils are equal, round, and reactive to light.  Neck:     Musculoskeletal: Normal range of motion and neck supple.  Cardiovascular:     Rate and Rhythm: Normal rate and regular rhythm.     Pulses: Normal pulses.     Heart sounds: Normal heart sounds.  Pulmonary:     Effort: Pulmonary effort is normal.     Breath sounds: Normal breath sounds.  Musculoskeletal: Normal range of motion.  Skin:    General: Skin is warm and dry.     Capillary Refill: Capillary refill takes less than 2 seconds.  Neurological:     General: No focal deficit present.     Mental Status: He is alert and oriented to person, place, and time.  Psychiatric:        Mood and Affect: Mood normal.        Behavior: Behavior normal.      ASSESSMENT & PLAN: Christopher [name] was seen today for hypertension and depression.  Diagnoses and all orders for this visit:   Essential hypertension -     amLODipine (NORVASC) 5 MG tablet; Take 1 tablet (5 mg total) by  mouth daily. -     lisinopril (ZESTRIL) 10 MG tablet; TAKE 1 TABLET BY MOUTH DAILY  Major depressive disorder with single episode, in full remission (HCC) -     CBC with Differential/Platelet -     Comprehensive metabolic panel -     Hemoglobin A1c -     Lipid panel -     PARoxetine (PAXIL) 20 MG tablet; Take 1 tablet (20 mg total) by mouth daily.    Patient Instructions  Hypertension, Adult High blood pressure (hypertension) is when the force of blood pumping through the arteries is too strong. The arteries are the blood vessels that carry blood from the heart throughout the body. Hypertension forces the heart to work harder to pump blood and may cause arteries to become narrow or stiff. Untreated or uncontrolled hypertension can cause a heart attack, heart failure, a stroke, kidney disease, and other problems. A blood pressure reading consists of a higher number over a lower number. Ideally, your blood pressure should be below 120/80. The first ("top") number is called the systolic pressure. It is a measure of the pressure in your arteries as your heart beats. The second ("bottom") number is called the diastolic pressure. It is a measure of the pressure in your arteries as the heart relaxes. What are the causes? The exact cause of this condition is not known. There are some conditions that result in or are related to high blood pressure. What increases the risk? Some risk factors for high blood pressure are under your control. The following factors may make you more likely to develop this condition:  Smoking.  Having type 2 diabetes mellitus, high cholesterol, or both.  Not getting enough exercise or physical activity.  Being overweight.  Having too much fat, sugar, calories, or salt (sodium) in your diet.  Drinking too much alcohol. Some risk factors for high blood pressure may be  difficult or impossible to change. Some of these factors include:  Having chronic kidney disease.  Having a family history of high blood pressure.  Age. Risk increases with age.  Race. You may be at higher risk if you are African American.  Gender. Men are at higher risk than women before age 35. After age 73, women are at higher risk than men.  Having obstructive sleep apnea.  Stress. What are the signs or symptoms? High blood pressure may not cause symptoms. Very high blood pressure (hypertensive crisis) may cause:  Headache.  Anxiety.  Shortness of breath.  Nosebleed.  Nausea and vomiting.  Vision changes.  Severe chest pain.  Seizures. How is this diagnosed? This condition is diagnosed by measuring your blood pressure while you are seated, with your arm resting on a flat surface, your legs uncrossed, and your feet flat on the floor. The cuff of the blood pressure monitor will be placed directly against the skin of your upper arm at the level of your heart. It should be measured at least twice using the same arm. Certain conditions can cause a difference in blood pressure between your right and left arms. Certain factors can cause blood pressure readings to be lower or higher than normal for a short period of time:  When your blood pressure is higher when you are in a health care provider's office than when you are at home, this is called white coat hypertension. Most people with this condition do not need medicines.  When your blood pressure is higher at home than when you are in a  health care provider's office, this is called masked hypertension. Most people with this condition may need medicines to control blood pressure. If you have a high blood pressure reading during one visit or you have normal blood pressure with other risk factors, you may be asked to:  Return on a different day to have your blood pressure checked again.  Monitor your blood pressure at home for  1 week or longer. If you are diagnosed with hypertension, you may have other blood or imaging tests to help your health care provider understand your overall risk for other conditions. How is this treated? This condition is treated by making healthy lifestyle changes, such as eating healthy foods, exercising more, and reducing your alcohol intake. Your health care provider may prescribe medicine if lifestyle changes are not enough to get your blood pressure under control, and if:  Your systolic blood pressure is above 130.  Your diastolic blood pressure is above 80. Your personal target blood pressure may vary depending on your medical conditions, your age, and other factors. Follow these instructions at home: Eating and drinking   Eat a diet that is high in fiber and potassium, and low in sodium, added sugar, and fat. An example eating plan is called the DASH (Dietary Approaches to Stop Hypertension) diet. To eat this way: ? Eat plenty of fresh fruits and vegetables. Try to fill one half of your plate at each meal with fruits and vegetables. ? Eat whole grains, such as whole-wheat pasta, brown rice, or whole-grain bread. Fill about one fourth of your plate with whole grains. ? Eat or drink low-fat dairy products, such as skim milk or low-fat yogurt. ? Avoid fatty cuts of meat, processed or cured meats, and poultry with skin. Fill about one fourth of your plate with lean proteins, such as fish, chicken without skin, beans, eggs, or tofu. ? Avoid pre-made and processed foods. These tend to be higher in sodium, added sugar, and fat.  Reduce your daily sodium intake. Most people with hypertension should eat less than 1,500 mg of sodium a day.  Do not drink alcohol if: ? Your health care provider tells you not to drink. ? You are pregnant, may be pregnant, or are planning to become pregnant.  If you drink alcohol: ? Limit how much you use to:  0-1 drink a day for women.  0-2 drinks a day  for men. ? Be aware of how much alcohol is in your drink. In the [place] one drink equals one 12 oz bottle of beer (355 mL), one 5 oz glass of wine (148 mL), or one 1 oz glass of hard liquor (44 mL). Lifestyle   Work with your health care provider to maintain a healthy body weight or to lose weight. Ask what an ideal weight is for you.  Get at least 30 minutes of exercise most days of the week. Activities may include walking, swimming, or biking.  Include exercise to strengthen your muscles (resistance exercise), such as Pilates or lifting weights, as part of your weekly exercise routine. Try to do these types of exercises for 30 minutes at least 3 days a week.  Do not use any products that contain nicotine or tobacco, such as cigarettes, e-cigarettes, and chewing tobacco. If you need help quitting, ask your health care provider.  Monitor your blood pressure at home as told by your health care provider.  Keep all follow-up visits as told by your health care provider. This is important. Medicines  Take over-the-counter and prescription medicines only as told by your health care provider. Follow directions carefully. Blood pressure medicines must be taken as prescribed.  Do not skip doses of blood pressure medicine. Doing this puts you at risk for problems and can make the medicine less effective.  Ask your health care provider about side effects or reactions to medicines that you should watch for. Contact a health care provider if you:  Think you are having a reaction to a medicine you are taking.  Have headaches that keep coming back (recurring).  Feel dizzy.  Have swelling in your ankles.  Have trouble with your vision. Get help right away if you:  Develop a severe headache or confusion.  Have unusual weakness or numbness.  Feel faint.  Have severe pain in your chest or abdomen.  Vomit repeatedly.  Have trouble breathing. Summary  Hypertension is when the force of  blood pumping through your arteries is too strong. If this condition is not controlled, it may put you at risk for serious complications.  Your personal target blood pressure may vary depending on your medical conditions, your age, and other factors. For most people, a normal blood pressure is less than 120/80.  Hypertension is treated with lifestyle changes, medicines, or a combination of both. Lifestyle changes include losing weight, eating a healthy, low-sodium diet, exercising more, and limiting alcohol. This information is not intended to replace advice given to you by your health care provider. Make sure you discuss any questions you have with your health care provider. Document Released: 03/08/2005 Document Revised: 11/16/2017 Document Reviewed: 11/16/2017 Elsevier Patient Education  2020 Elsevier Inc.      Agustina Caroli, MD [facility]

## 2018-10-12 ENCOUNTER — Other Ambulatory Visit: Payer: Self-pay | Admitting: Emergency Medicine

## 2018-10-12 DIAGNOSIS — N289 Disorder of kidney and ureter, unspecified: Secondary | ICD-10-CM

## 2018-10-12 LAB — COMPREHENSIVE METABOLIC PANEL
ALT: 15 IU/L (ref 0–44)
AST: 17 IU/L (ref 0–40)
Albumin/Globulin Ratio: 1.8 (ref 1.2–2.2)
Albumin: 4.1 g/dL (ref 3.8–4.8)
Alkaline Phosphatase: 94 IU/L (ref 39–117)
BUN/Creatinine Ratio: 13 (ref 10–24)
BUN: 32 mg/dL — ABNORMAL HIGH (ref 8–27)
Bilirubin Total: 0.4 mg/dL (ref 0.0–1.2)
CO2: 22 mmol/L (ref 20–29)
Calcium: 9.9 mg/dL (ref 8.6–10.2)
Chloride: 104 mmol/L (ref 96–106)
Creatinine, Ser: 2.38 mg/dL — ABNORMAL HIGH (ref 0.76–1.27)
GFR calc Af Amer: 31 mL/min/{1.73_m2} — ABNORMAL LOW (ref >59)
GFR calc non Af Amer: 27 mL/min/{1.73_m2} — ABNORMAL LOW (ref >59)
Globulin, Total: 2.3 g/dL (ref 1.5–4.5)
Glucose: 92 mg/dL (ref 65–99)
Potassium: 5.3 mmol/L — ABNORMAL HIGH (ref 3.5–5.2)
Sodium: 138 mmol/L (ref 134–144)
Total Protein: 6.4 g/dL (ref 6.0–8.5)

## 2018-10-12 LAB — HEMOGLOBIN A1C
Est. average glucose Bld gHb Est-mCnc: 105 mg/dL
Hgb A1c MFr Bld: 5.3 % (ref 4.8–5.6)

## 2018-10-12 LAB — CBC WITH DIFFERENTIAL/PLATELET
Basophils Absolute: 0 10*3/uL (ref 0.0–0.2)
Basos: 1 %
EOS (ABSOLUTE): 0.4 10*3/uL (ref 0.0–0.4)
Eos: 7 %
Hematocrit: 45.1 % (ref 37.5–51.0)
Hemoglobin: 14.7 g/dL (ref 13.0–17.7)
Immature Grans (Abs): 0 10*3/uL (ref 0.0–0.1)
Immature Granulocytes: 1 %
Lymphocytes Absolute: 1.2 10*3/uL (ref 0.7–3.1)
Lymphs: 19 %
MCH: 28.6 pg (ref 26.6–33.0)
MCHC: 32.6 g/dL (ref 31.5–35.7)
MCV: 88 fL (ref 79–97)
Monocytes Absolute: 0.7 10*3/uL (ref 0.1–0.9)
Monocytes: 11 %
Neutrophils Absolute: 3.9 10*3/uL (ref 1.4–7.0)
Neutrophils: 61 %
Platelets: 282 10*3/uL (ref 150–450)
RBC: 5.14 x10E6/uL (ref 4.14–5.80)
RDW: 14.1 % (ref 11.6–15.4)
WBC: 6.3 10*3/uL (ref 3.4–10.8)

## 2018-10-12 LAB — LIPID PANEL
Chol/HDL Ratio: 3.2 ratio (ref 0.0–5.0)
Cholesterol, Total: 164 mg/dL (ref 100–199)
HDL: 51 mg/dL (ref >39)
LDL Calculated: 89 mg/dL (ref 0–99)
Triglycerides: 120 mg/dL (ref 0–149)
VLDL Cholesterol Cal: 24 mg/dL (ref 5–40)

## 2018-11-21 ENCOUNTER — Other Ambulatory Visit: Payer: Self-pay | Admitting: Nephrology

## 2018-11-21 DIAGNOSIS — N184 Chronic kidney disease, stage 4 (severe): Secondary | ICD-10-CM

## 2018-11-24 ENCOUNTER — Ambulatory Visit
Admission: RE | Admit: 2018-11-24 | Discharge: 2018-11-24 | Disposition: A | Discharge status: Home / Self Care | Payer: Medicare Other | Source: Ambulatory Visit | Attending: Nephrology | Admitting: Nephrology

## 2018-11-24 DIAGNOSIS — N184 Chronic kidney disease, stage 4 (severe): Secondary | ICD-10-CM

## 2019-01-15 ENCOUNTER — Ambulatory Visit (INDEPENDENT_AMBULATORY_CARE_PROVIDER_SITE_OTHER): Payer: Medicare Other | Admitting: Emergency Medicine

## 2019-01-15 VITALS — BP 115/83 | Ht 69.0 in | Wt 210.0 lb

## 2019-01-15 DIAGNOSIS — Z0000 Encounter for general adult medical examination without abnormal findings: Secondary | ICD-10-CM | POA: Diagnosis not present

## 2019-01-15 NOTE — Patient Instructions (Signed)
Thank you for taking time to come for your Medicare Wellness Visit. I appreciate your ongoing commitment to your health goals. Please review the following plan we discussed and let me know if I can assist you in the future.  Christopher Kennedy LPN  Preventive Care 68 Years and Older, Male Preventive care refers to lifestyle choices and visits with your health care provider that can promote health and wellness. This includes:  A yearly physical exam. This is also called an annual well check.  Regular dental and eye exams.  Immunizations.  Screening for certain conditions.  Healthy lifestyle choices, such as diet and exercise. What can I expect for my preventive care visit? Physical exam Your health care provider will check:  Height and weight. These may be used to calculate body mass index (BMI), which is a measurement that tells if you are at a healthy weight.  Heart rate and blood pressure.  Your skin for abnormal spots. Counseling Your health care provider may ask you questions about:  Alcohol, tobacco, and drug use.  Emotional well-being.  Home and relationship well-being.  Sexual activity.  Eating habits.  History of falls.  Memory and ability to understand (cognition).  Work and work Statistician. What immunizations do I need?  Influenza (flu) vaccine  This is recommended every year. Tetanus, diphtheria, and pertussis (Tdap) vaccine  You may need a Td booster every 10 years. Varicella (chickenpox) vaccine  You may need this vaccine if you have not already been vaccinated. Zoster (shingles) vaccine  You may need this after age 66. Pneumococcal conjugate (PCV13) vaccine  One dose is recommended after age 84. Pneumococcal polysaccharide (PPSV23) vaccine  One dose is recommended after age 88. Measles, mumps, and rubella (MMR) vaccine  You may need at least one dose of MMR if you were born in 1957 or later. You may also need a second dose. Meningococcal  conjugate (MenACWY) vaccine  You may need this if you have certain conditions. Hepatitis A vaccine  You may need this if you have certain conditions or if you travel or work in places where you may be exposed to hepatitis A. Hepatitis B vaccine  You may need this if you have certain conditions or if you travel or work in places where you may be exposed to hepatitis B. Haemophilus influenzae type b (Hib) vaccine  You may need this if you have certain conditions. You may receive vaccines as individual doses or as more than one vaccine together in one shot (combination vaccines). Talk with your health care provider about the risks and benefits of combination vaccines. What tests do I need? Blood tests  Lipid and cholesterol levels. These may be checked every 5 years, or more frequently depending on your overall health.  Hepatitis C test.  Hepatitis B test. Screening  Lung cancer screening. You may have this screening every year starting at age 24 if you have a 30-pack-year history of smoking and currently smoke or have quit within the past 15 years.  Colorectal cancer screening. All adults should have this screening starting at age 52 and continuing until age 61. Your health care provider may recommend screening at age 57 if you are at increased risk. You will have tests every 1-10 years, depending on your results and the type of screening test.  Prostate cancer screening. Recommendations will vary depending on your family history and other risks.  Diabetes screening. This is done by checking your blood sugar (glucose) after you have not eaten for  a while (fasting). You may have this done every 1-3 years.  Abdominal aortic aneurysm (AAA) screening. You may need this if you are a current or former smoker.  Sexually transmitted disease (STD) testing. Follow these instructions at home: Eating and drinking  Eat a diet that includes fresh fruits and vegetables, whole grains, lean  protein, and low-fat dairy products. Limit your intake of foods with high amounts of sugar, saturated fats, and salt.  Take vitamin and mineral supplements as recommended by your health care provider.  Do not drink alcohol if your health care provider tells you not to drink.  If you drink alcohol: ? Limit how much you have to 0-2 drinks a day. ? Be aware of how much alcohol is in your drink. In the [place] one drink equals one 12 oz bottle of beer (355 mL), one 5 oz glass of wine (148 mL), or one 1 oz glass of hard liquor (44 mL). Lifestyle  Take daily care of your teeth and gums.  Stay active. Exercise for at least 30 minutes on 5 or more days each week.  Do not use any products that contain nicotine or tobacco, such as cigarettes, e-cigarettes, and chewing tobacco. If you need help quitting, ask your health care provider.  If you are sexually active, practice safe sex. Use a condom or other form of protection to prevent STIs (sexually transmitted infections).  Talk with your health care provider about taking a low-dose aspirin or statin. What's next?  Visit your health care provider once a year for a well check visit.  Ask your health care provider how often you should have your eyes and teeth checked.  Stay up to date on all vaccines. This information is not intended to replace advice given to you by your health care provider. Make sure you discuss any questions you have with your health care provider. Document Released: 04/04/2015 Document Revised: 03/02/2018 Document Reviewed: 03/02/2018 Elsevier Patient Education  2020 [employer].

## 2019-01-15 NOTE — Progress Notes (Signed)
Presents today for [employer] Visit   Date of last exam: 10/11/2018   Interpreter used for this visit?   No  I connected with  Christopher [name] on 01/15/19 by a telephoneand verified that I am speaking with the correct person using two identifiers.   I discussed the limitations of evaluation and management by telemedicine. The patient expressed understanding and agreed to proceed.   Patient Care Team: Horald Pollen, MD as PCP - General (Internal Medicine)   Other items to address today:   Discussed Eye/Dental Discussed immunizations 04-12-2018 9:20 follow up schedule Dr. Mitchel Honour    Other Screening: Last screening for diabetes: 10/11/2018 Last lipid screening: 10/11/2018  ADVANCE DIRECTIVES: Discussed: yes On File:  no Materials Provided: yes (mailed)  Immunization status:  Immunization History  Administered Date(s) Administered  . Pneumococcal Conjugate-13 10/11/2018     Health Maintenance Due  Topic Date Due  . Hepatitis C Screening  Mar 29, 1950  . INFLUENZA VACCINE  10/21/2018     Functional Status Survey: Is the patient deaf or have difficulty hearing?: No Does the patient have difficulty seeing, even when wearing glasses/contacts?: No Does the patient have difficulty concentrating, remembering, or making decisions?: No Does the patient have difficulty walking or climbing stairs?: No Does the patient have difficulty dressing or bathing?: No Does the patient have difficulty doing errands alone such as visiting a doctor's office or shopping?: No   6CIT Screen 01/15/2019  What Year? 0 points  What month? 0 points  What time? 0 points  Count back from 20 0 points  Months in reverse 0 points  Repeat phrase 6 points  Total Score 6        Clinical Support from 01/15/2019 in [place] at [place]  AUDIT-C Score  1       Home Environment:    Lives one story home No trouble climbing stairs No scattered rugs No grab  bars Adequate lighting/ no clutter  Take care of his grandkids twin 55 year olds girl  and 53 year old boy.   Patient Active Problem List   Diagnosis Date Noted  . Depression 08/26/2016  . 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 HTN (hypertension) 11/21/2011     Past Medical History:  Diagnosis Date  . Anxiety   . Cancer ([place])   . Depression   . Hypertension      Past Surgical History:  Procedure Laterality Date  . BLADDER SURGERY    . JOINT REPLACEMENT  2016     Family History  Problem Relation Age of Onset  . Cancer Mother   . Cancer Father   . Hypertension Father   . Heart disease Father   . Cancer Sister   . Diabetes Sister   . Heart disease Sister   . Hypertension Sister   . Mental illness Brother   . Colon cancer Neg Hx      Social History   Socioeconomic History  . Marital status: Single    Spouse name: Not on file  . Number of children: Not on file  . Years of education: Not on file  . Highest education level: Not on file  Occupational History  . Not on file  Social Needs  . Financial resource strain: Not on file  . Food insecurity    Worry: Not on file    Inability: Not on file  . Transportation needs    Medical: Not on file  Non-medical: Not on file  Tobacco Use  . Smoking status: Former Smoker    Quit date: 03/22/1972    Years since quitting: 46.8  . Smokeless tobacco: Never Used  Substance and Sexual Activity  . Alcohol use: Yes    Alcohol/week: 0.0 standard drinks    Comment: rare  . Drug use: No  . Sexual activity: Not on file  Lifestyle  . Physical activity    Days per week: Not on file    Minutes per session: Not on file  . Stress: Not on file  Relationships  . Social Herbalist on phone: Not on file    Gets together: Not on file    Attends religious service: Not on file    Active member of club or organization: Not on file    Attends meetings of clubs or organizations: Not on file     Relationship status: Not on file  . Intimate partner violence    Fear of current or ex partner: Not on file    Emotionally abused: Not on file    Physically abused: Not on file    Forced sexual activity: Not on file  Other Topics Concern  . Not on file  Social History Narrative  . Not on file     No Known Allergies   Prior to Admission medications   Medication Sig Start Date End Date Taking? Authorizing Provider  lisinopril (ZESTRIL) 10 MG tablet TAKE 1 TABLET BY MOUTH DAILY 10/11/18  Yes Sagardia, Ines Bloomer, MD  tamsulosin [facility]) 0.4 MG CAPS capsule  04/12/15  Yes [provider]  ALPRAZolam (XANAX) 0.5 MG tablet Take 1 tablet (0.5 mg total) by mouth 2 (two) times daily as needed for anxiety. Patient not taking: Reported on 01/15/2019 09/10/17   Horald Pollen, MD  amLODipine (NORVASC) 5 MG tablet Take 1 tablet (5 mg total) by mouth daily. 10/11/18 01/09/19  Horald Pollen, MD  PARoxetine (PAXIL) 20 MG tablet Take 1 tablet (20 mg total) by mouth daily. 10/11/18 01/09/19  Horald Pollen, MD     Depression screen [facility] 2/9 01/15/2019 10/11/2018 04/07/2018 09/10/2017 08/26/2016  Decreased Interest 0 0 0 0 0  Down, Depressed, Hopeless 0 0 0 0 0  PHQ - 2 Score 0 0 0 0 0  Altered sleeping - 0 - - -  Tired, decreased energy - 0 - - -  Change in appetite - 0 - - -  Feeling bad or failure about yourself  - 0 - - -  Trouble concentrating - 0 - - -  Moving slowly or fidgety/restless - 0 - - -  Suicidal thoughts - 0 - - -  PHQ-9 Score - 0 - - -  Difficult doing work/chores - Somewhat difficult - - -     Fall Risk  01/15/2019 04/07/2018 09/10/2017 08/26/2016 08/26/2016  Falls in the past year? 0 0 No No No  Number falls in past yr: 0 - - - -  Injury with Fall? 0 - - - -  Follow up Falls evaluation completed;Education provided;Falls prevention discussed - - - -      PHYSICAL EXAM: BP 115/83 Comment: taken from previous  Ht 5\' 9"  (1.753 m)   Wt 210 lb (95.3  kg)   BMI 31.01 kg/m    Wt Readings from Last 3 Encounters:  01/15/19 210 lb (95.3 kg)  10/11/18 210 lb 12.8 oz (95.6 kg)  04/07/18 207 lb (93.9 kg)  Medicare annual wellness visit, subsequent   Education/Counseling provided regarding diet and exercise, prevention of chronic diseases, smoking/tobacco cessation, if applicable, and reviewed "Covered Medicare Preventive Services."

## 2019-04-13 ENCOUNTER — Ambulatory Visit: Payer: Medicare Other | Admitting: Emergency Medicine

## 2019-04-16 ENCOUNTER — Ambulatory Visit (INDEPENDENT_AMBULATORY_CARE_PROVIDER_SITE_OTHER): Payer: Medicare PPO | Admitting: Emergency Medicine

## 2019-04-16 ENCOUNTER — Other Ambulatory Visit: Payer: Self-pay

## 2019-04-16 ENCOUNTER — Encounter: Payer: Self-pay | Admitting: Emergency Medicine

## 2019-04-16 VITALS — BP 115/71 | HR 61 | Temp 98.7°F | Wt 209.0 lb

## 2019-04-16 DIAGNOSIS — R413 Other amnesia: Secondary | ICD-10-CM | POA: Insufficient documentation

## 2019-04-16 DIAGNOSIS — N281 Cyst of kidney, acquired: Secondary | ICD-10-CM | POA: Diagnosis not present

## 2019-04-16 DIAGNOSIS — N186 End stage renal disease: Secondary | ICD-10-CM | POA: Insufficient documentation

## 2019-04-16 DIAGNOSIS — N184 Chronic kidney disease, stage 4 (severe): Secondary | ICD-10-CM | POA: Diagnosis not present

## 2019-04-16 DIAGNOSIS — I10 Essential (primary) hypertension: Secondary | ICD-10-CM | POA: Diagnosis not present

## 2019-04-16 DIAGNOSIS — F329 Major depressive disorder, single episode, unspecified: Secondary | ICD-10-CM | POA: Diagnosis not present

## 2019-04-16 DIAGNOSIS — Z992 Dependence on renal dialysis: Secondary | ICD-10-CM | POA: Insufficient documentation

## 2019-04-16 DIAGNOSIS — F32A Depression, unspecified: Secondary | ICD-10-CM

## 2019-04-16 NOTE — Patient Instructions (Addendum)
   If you have lab work done today you will be contacted with your lab results within the next 2 weeks.  If you have not heard from us then please contact us. The fastest way to get your results is to register for My Chart.   IF you received an x-ray today, you will receive an invoice from [facility]. Please contact [facility] at [phone] with questions or concerns regarding your invoice.   IF you received labwork today, you will receive an invoice from [facility]. Please contact [facility] at [phone] with questions or concerns regarding your invoice.   Our billing staff will not be able to assist you with questions regarding bills from these companies.  You will be contacted with the lab results as soon as they are available. The fastest way to get your results is to activate your My Chart account. Instructions are located on the last page of this paperwork. If you have not heard from us regarding the results in 2 weeks, please contact this office.      Hypertension, Adult High blood pressure (hypertension) is when the force of blood pumping through the arteries is too strong. The arteries are the blood vessels that carry blood from the heart throughout the body. Hypertension forces the heart to work harder to pump blood and may cause arteries to become narrow or stiff. Untreated or uncontrolled hypertension can cause a heart attack, heart failure, a stroke, kidney disease, and other problems. A blood pressure reading consists of a higher number over a lower number. Ideally, your blood pressure should be below 120/80. The first ("top") number is called the systolic pressure. It is a measure of the pressure in your arteries as your heart beats. The second ("bottom") number is called the diastolic pressure. It is a measure of the pressure in your arteries as the heart relaxes. What are the causes? The exact cause of this condition is not known. There are some conditions  that result in or are related to high blood pressure. What increases the risk? Some risk factors for high blood pressure are under your control. The following factors may make you more likely to develop this condition:  Smoking.  Having type 2 diabetes mellitus, high cholesterol, or both.  Not getting enough exercise or physical activity.  Being overweight.  Having too much fat, sugar, calories, or salt (sodium) in your diet.  Drinking too much alcohol. Some risk factors for high blood pressure may be difficult or impossible to change. Some of these factors include:  Having chronic kidney disease.  Having a family history of high blood pressure.  Age. Risk increases with age.  Race. You may be at higher risk if you are African American.  Gender. Men are at higher risk than women before age 45. After age 65, women are at higher risk than men.  Having obstructive sleep apnea.  Stress. What are the signs or symptoms? High blood pressure may not cause symptoms. Very high blood pressure (hypertensive crisis) may cause:  Headache.  Anxiety.  Shortness of breath.  Nosebleed.  Nausea and vomiting.  Vision changes.  Severe chest pain.  Seizures. How is this diagnosed? This condition is diagnosed by measuring your blood pressure while you are seated, with your arm resting on a flat surface, your legs uncrossed, and your feet flat on the floor. The cuff of the blood pressure monitor will be placed directly against the skin of your upper arm at the level of your   heart. It should be measured at least twice using the same arm. Certain conditions can cause a difference in blood pressure between your right and left arms. Certain factors can cause blood pressure readings to be lower or higher than normal for a short period of time:  When your blood pressure is higher when you are in a health care provider's office than when you are at home, this is called white coat hypertension.  Most people with this condition do not need medicines.  When your blood pressure is higher at home than when you are in a health care provider's office, this is called masked hypertension. Most people with this condition may need medicines to control blood pressure. If you have a high blood pressure reading during one visit or you have normal blood pressure with other risk factors, you may be asked to:  Return on a different day to have your blood pressure checked again.  Monitor your blood pressure at home for 1 week or longer. If you are diagnosed with hypertension, you may have other blood or imaging tests to help your health care provider understand your overall risk for other conditions. How is this treated? This condition is treated by making healthy lifestyle changes, such as eating healthy foods, exercising more, and reducing your alcohol intake. Your health care provider may prescribe medicine if lifestyle changes are not enough to get your blood pressure under control, and if:  Your systolic blood pressure is above 130.  Your diastolic blood pressure is above 80. Your personal target blood pressure may vary depending on your medical conditions, your age, and other factors. Follow these instructions at home: Eating and drinking   Eat a diet that is high in fiber and potassium, and low in sodium, added sugar, and fat. An example eating plan is called the DASH (Dietary Approaches to Stop Hypertension) diet. To eat this way: ? Eat plenty of fresh fruits and vegetables. Try to fill one half of your plate at each meal with fruits and vegetables. ? Eat whole grains, such as whole-wheat pasta, brown rice, or whole-grain bread. Fill about one fourth of your plate with whole grains. ? Eat or drink low-fat dairy products, such as skim milk or low-fat yogurt. ? Avoid fatty cuts of meat, processed or cured meats, and poultry with skin. Fill about one fourth of your plate with lean proteins, such  as fish, chicken without skin, beans, eggs, or tofu. ? Avoid pre-made and processed foods. These tend to be higher in sodium, added sugar, and fat.  Reduce your daily sodium intake. Most people with hypertension should eat less than 1,500 mg of sodium a day.  Do not drink alcohol if: ? Your health care provider tells you not to drink. ? You are pregnant, may be pregnant, or are planning to become pregnant.  If you drink alcohol: ? Limit how much you use to:  0-1 drink a day for women.  0-2 drinks a day for men. ? Be aware of how much alcohol is in your drink. In the [place] one drink equals one 12 oz bottle of beer (355 mL), one 5 oz glass of wine (148 mL), or one 1 oz glass of hard liquor (44 mL). Lifestyle   Work with your health care provider to maintain a healthy body weight or to lose weight. Ask what an ideal weight is for you.  Get at least 30 minutes of exercise most days of the week. Activities may include walking, swimming,   or biking.  Include exercise to strengthen your muscles (resistance exercise), such as Pilates or lifting weights, as part of your weekly exercise routine. Try to do these types of exercises for 30 minutes at least 3 days a week.  Do not use any products that contain nicotine or tobacco, such as cigarettes, e-cigarettes, and chewing tobacco. If you need help quitting, ask your health care provider.  Monitor your blood pressure at home as told by your health care provider.  Keep all follow-up visits as told by your health care provider. This is important. Medicines  Take over-the-counter and prescription medicines only as told by your health care provider. Follow directions carefully. Blood pressure medicines must be taken as prescribed.  Do not skip doses of blood pressure medicine. Doing this puts you at risk for problems and can make the medicine less effective.  Ask your health care provider about side effects or reactions to medicines that you  should watch for. Contact a health care provider if you:  Think you are having a reaction to a medicine you are taking.  Have headaches that keep coming back (recurring).  Feel dizzy.  Have swelling in your ankles.  Have trouble with your vision. Get help right away if you:  Develop a severe headache or confusion.  Have unusual weakness or numbness.  Feel faint.  Have severe pain in your chest or abdomen.  Vomit repeatedly.  Have trouble breathing. Summary  Hypertension is when the force of blood pumping through your arteries is too strong. If this condition is not controlled, it may put you at risk for serious complications.  Your personal target blood pressure may vary depending on your medical conditions, your age, and other factors. For most people, a normal blood pressure is less than 120/80.  Hypertension is treated with lifestyle changes, medicines, or a combination of both. Lifestyle changes include losing weight, eating a healthy, low-sodium diet, exercising more, and limiting alcohol. This information is not intended to replace advice given to you by your health care provider. Make sure you discuss any questions you have with your health care provider. Document Revised: 11/16/2017 Document Reviewed: 11/16/2017 Elsevier Patient Education  [place].  Chronic Kidney Disease, Adult Chronic kidney disease (CKD) happens when the kidneys are damaged over a long period of time. The kidneys are two organs that help with:  Getting rid of waste and extra fluid from the blood.  Making hormones that maintain the amount of fluid in your tissues and blood vessels.  Making sure that the body has the right amount of fluids and chemicals. Most of the time, CKD does not go away, but it can usually be controlled. Steps must be taken to slow down the kidney damage or to stop it from getting worse. If this is not done, the kidneys may stop working. Follow these instructions  at home: Medicines  Take over-the-counter and prescription medicines only as told by your doctor. You may need to change the amount of medicines you take.  Do not take any new medicines unless your doctor says it is okay. Many medicines can make your kidney damage worse.  Do not take any vitamin and supplements unless your doctor says it is okay. Many vitamins and supplements can make your kidney damage worse. General instructions  Follow a diet as told by your doctor. You may need to stay away from: ? Alcohol. ? Salty foods. ? Foods that are high in:  Potassium.  Calcium.  Protein.  Do not use any products that contain nicotine or tobacco, such as cigarettes and e-cigarettes. If you need help quitting, ask your doctor.  Keep track of your blood pressure at home. Tell your doctor about any changes.  If you have diabetes, keep track of your blood sugar as told by your doctor.  Try to stay at a healthy weight. If you need help, ask your doctor.  Exercise at least 30 minutes a day, 5 days a week.  Stay up-to-date with your shots (immunizations) as told by your doctor.  Keep all follow-up visits as told by your doctor. This is important. Contact a doctor if:  Your symptoms get worse.  You have new symptoms. Get help right away if:  You have symptoms of end-stage kidney disease. These may include: ? Headaches. ? Numbness in your hands or feet. ? Easy bruising. ? Having hiccups often. ? Chest pain. ? Shortness of breath. ? Stopping of menstrual periods in women.  You have a fever.  You have very little pee (urine).  You have pain or bleeding when you pee. Summary  Chronic kidney disease (CKD) happens when the kidneys are damaged over a long period of time.  Most of the time, this condition does not go away, but it can usually be controlled. Steps must be taken to slow down the kidney damage or to stop it from getting worse.  Treatment may include a combination  of medicines and lifestyle changes. This information is not intended to replace advice given to you by your health care provider. Make sure you discuss any questions you have with your health care provider. Document Revised: 02/18/2017 Document Reviewed: 04/12/2016 Elsevier Patient Education  2020 [employer].

## 2019-04-16 NOTE — Progress Notes (Signed)
Christopher [name] 69 y.o.   Chief Complaint  Patient presents with  . Medical Management of Chronic Issues    6 month f/u on HTN  . Memory Loss    Been having memory problems for the past 3 month    HISTORY OF PRESENT ILLNESS: This is a 69 y.o. male with multiple chronic medical problems here for follow-up: 1.  Hypertension: On amlodipine 5 mg and lisinopril 10 mg daily.  Doing well. 2.  Chronic depression: On Paxil 20 mg daily.  Working well.  Increase stressors at home with wife's recent CVA and taking care of 3 grandkids since last August.  Not sleeping more than 5 hours at night.  Feels "tired all the time".  Depression screen [facility] 2/9 04/16/2019 04/16/2019 01/15/2019 10/11/2018 04/07/2018  Decreased Interest 0 0 0 0 0  Down, Depressed, Hopeless 0 3 0 0 0  PHQ - 2 Score 0 3 0 0 0  Altered sleeping 2 2 - 0 -  Tired, decreased energy 1 1 - 0 -  Change in appetite 0 0 - 0 -  Feeling bad or failure about yourself  0 0 - 0 -  Trouble concentrating 1 1 - 0 -  Moving slowly or fidgety/restless 0 0 - 0 -  Suicidal thoughts 0 0 - 0 -  PHQ-9 Score 4 7 - 0 -  Difficult doing work/chores - - - Somewhat difficult -    3.  Chronic kidney disease with GFR of 27 and polycystic kidneys.  Seen nephrologist with follow-up appointment in 2 to 3 months. Renal ultrasound done last September: IMPRESSION: 1. Echogenic kidneys consistent with medical renal disease, renal size is somewhat small suggesting a degree of atrophy. No hydronephrosis 2. Cysts, fewer than 10 within the right kidney. Upper pole lesion is slightly complex.  4.  Today complaining of lapses of memory loss for the past 3 months.  Forgetful.  HPI   Prior to Admission medications   Medication Sig Start Date End Date Taking? Authorizing Provider  ALPRAZolam Duanne Moron) 0.5 MG tablet Take 1 tablet (0.5 mg total) by mouth 2 (two) times daily as needed for anxiety. 09/10/17  Yes Horald Pollen, MD  lisinopril (ZESTRIL) 10 MG  tablet TAKE 1 TABLET BY MOUTH DAILY 10/11/18  Yes Horald Pollen, MD  tamsulosin [facility]) 0.4 MG CAPS capsule  04/12/15  Yes [provider]  amLODipine (NORVASC) 5 MG tablet Take 1 tablet (5 mg total) by mouth daily. 10/11/18 01/09/19  Horald Pollen, MD  PARoxetine (PAXIL) 20 MG tablet Take 1 tablet (20 mg total) by mouth daily. 10/11/18 01/09/19  Horald Pollen, MD    No Known Allergies  Patient Active Problem List   Diagnosis Date Noted  . Depression 08/26/2016  . 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 HTN (hypertension) 11/21/2011    Past Medical History:  Diagnosis Date  . Anxiety   . Cancer ([place])   . Depression   . Hypertension     Past Surgical History:  Procedure Laterality Date  . BLADDER SURGERY    . JOINT REPLACEMENT  2016    Social History   Socioeconomic History  . Marital status: Single    Spouse name: Not on file  . Number of children: Not on file  . Years of education: Not on file  . Highest education level: Not on file  Occupational History  . Not on file  Tobacco Use  . Smoking status: Former Smoker  Quit date: 03/22/1972    Years since quitting: 47.0  . Smokeless tobacco: Never Used  Substance and Sexual Activity  . Alcohol use: Yes    Alcohol/week: 0.0 standard drinks    Comment: rare  . Drug use: No  . Sexual activity: Not on file  Other Topics Concern  . Not on file  Social History Narrative  . Not on file   Social Determinants of Health   Financial Resource Strain:   . Difficulty of Paying Living Expenses: Not on file  Food Insecurity:   . Worried About Charity fundraiser in the Last Year: Not on file  . Ran Out of Food in the Last Year: Not on file  Transportation Needs:   . Lack of Transportation (Medical): Not on file  . Lack of Transportation (Non-Medical): Not on file  Physical Activity:   . Days of Exercise per Week: Not on file  . Minutes of Exercise per Session: Not on file   Stress:   . Feeling of Stress : Not on file  Social Connections:   . Frequency of Communication with Friends and Family: Not on file  . Frequency of Social Gatherings with Friends and Family: Not on file  . Attends Religious Services: Not on file  . Active Member of Clubs or Organizations: Not on file  . Attends Archivist Meetings: Not on file  . Marital Status: Not on file  Intimate Partner Violence:   . Fear of Current or Ex-Partner: Not on file  . Emotionally Abused: Not on file  . Physically Abused: Not on file  . Sexually Abused: Not on file    Family History  Problem Relation Age of Onset  . Cancer Mother   . Cancer Father   . Hypertension Father   . Heart disease Father   . Cancer Sister   . Diabetes Sister   . Heart disease Sister   . Hypertension Sister   . Mental illness Brother   . Colon cancer Neg Hx      Review of Systems  Constitutional: Negative.  Negative for chills and fever.  HENT: Negative.  Negative for congestion and sore throat.   Respiratory: Negative.  Negative for cough and shortness of breath.   Cardiovascular: Negative.  Negative for chest pain and palpitations.  Gastrointestinal: Negative.  Negative for abdominal pain, blood in stool, diarrhea, melena, nausea and vomiting.  Genitourinary: Negative.  Negative for dysuria and hematuria.  Musculoskeletal: Negative.  Negative for back pain, myalgias and neck pain.  Skin: Negative.  Negative for rash.  Neurological: Negative.  Negative for dizziness and headaches.  Endo/Heme/Allergies: Negative.   All other systems reviewed and are negative.  Today's Vitals   04/16/19 1014  BP: 115/71  Pulse: 61  Temp: 98.7 F (37.1 C)  TempSrc: Temporal  SpO2: 98%  Weight: 209 lb (94.8 kg)   Body mass index is 30.86 kg/m.   Physical Exam Vitals reviewed.  Constitutional:      Appearance: Normal appearance.  HENT:     Head: Normocephalic.  Eyes:     Extraocular Movements: Extraocular  movements intact.     Conjunctiva/sclera: Conjunctivae normal.     Pupils: Pupils are equal, round, and reactive to light.  Cardiovascular:     Rate and Rhythm: Normal rate and regular rhythm.     Pulses: Normal pulses.     Heart sounds: Normal heart sounds.  Pulmonary:     Effort: Pulmonary effort is normal.  Breath sounds: Normal breath sounds.  Musculoskeletal:        General: Normal range of motion.     Cervical back: Normal range of motion and neck supple.     Right lower leg: No edema.     Left lower leg: No edema.  Skin:    General: Skin is warm and dry.     Capillary Refill: Capillary refill takes less than 2 seconds.  Neurological:     General: No focal deficit present.     Mental Status: He is alert and oriented to person, place, and time.     A total of 30 minutes was spent with the patient, greater than 50% of which was in counseling/coordination of care regarding chronic medical problems including hypertension and chronic kidney disease, management and medication review, diet and nutrition, review of most recent blood work, review of renal ultrasound, review of most recent office notes, memory issues and need for neurological evaluation, prognosis, and need for follow-up.   ASSESSMENT & PLAN: Mina was seen today for medical management of chronic issues and memory loss.  Diagnoses and all orders for this visit:  Essential hypertension  Memory deficits -     Ambulatory referral to Neurology  Depression, unspecified depression type  Stage 4 chronic kidney disease ([place])  Kidney cysts    Patient Instructions       If you have lab work done today you will be contacted with your lab results within the next 2 weeks.  If you have not heard from [place] then please contact us. The fastest way to get your results is to register for My Chart.   IF you received an x-ray today, you will receive an invoice from [facility]. Please contact [facility] at [phone] with questions or concerns regarding your invoice.   IF you received labwork today, you will receive an invoice from [facility]. Please contact [facility] at [phone] with questions or concerns regarding your invoice.   Our billing staff will not be able to assist you with questions regarding bills from these companies.  You will be contacted with the lab results as soon as they are available. The fastest way to get your results is to activate your My Chart account. Instructions are located on the last page of this paperwork. If you have not heard from [place] regarding the results in 2 weeks, please contact this office.      Hypertension, Adult High blood pressure (hypertension) is when the force of blood pumping through the arteries is too strong. The arteries are the blood vessels that carry blood from the heart throughout the body. Hypertension forces the heart to work harder to pump blood and may cause arteries to become narrow or stiff. Untreated or uncontrolled hypertension can cause a heart attack, heart failure, a stroke, kidney disease, and other problems. A blood pressure reading consists of a higher number over a lower number. Ideally, your blood pressure should be below 120/80. The first ("top") number is called the systolic pressure. It is a measure of the pressure in your arteries as your heart beats. The second ("bottom") number is called the diastolic pressure. It is a measure of the pressure in your arteries as the heart relaxes. What are the causes? The exact cause of this condition is not known. There are some conditions that result in or are related to high blood pressure. What increases the risk? Some risk factors for high blood pressure are under your control. The following  factors may make you more likely to develop this condition:  Smoking.  Having type 2 diabetes mellitus, high cholesterol, or both.  Not getting enough exercise or physical  activity.  Being overweight.  Having too much fat, sugar, calories, or salt (sodium) in your diet.  Drinking too much alcohol. Some risk factors for high blood pressure may be difficult or impossible to change. Some of these factors include:  Having chronic kidney disease.  Having a family history of high blood pressure.  Age. Risk increases with age.  Race. You may be at higher risk if you are African American.  Gender. Men are at higher risk than women before age 16. After age 59, women are at higher risk than men.  Having obstructive sleep apnea.  Stress. What are the signs or symptoms? High blood pressure may not cause symptoms. Very high blood pressure (hypertensive crisis) may cause:  Headache.  Anxiety.  Shortness of breath.  Nosebleed.  Nausea and vomiting.  Vision changes.  Severe chest pain.  Seizures. How is this diagnosed? This condition is diagnosed by measuring your blood pressure while you are seated, with your arm resting on a flat surface, your legs uncrossed, and your feet flat on the floor. The cuff of the blood pressure monitor will be placed directly against the skin of your upper arm at the level of your heart. It should be measured at least twice using the same arm. Certain conditions can cause a difference in blood pressure between your right and left arms. Certain factors can cause blood pressure readings to be lower or higher than normal for a short period of time:  When your blood pressure is higher when you are in a health care provider's office than when you are at home, this is called white coat hypertension. Most people with this condition do not need medicines.  When your blood pressure is higher at home than when you are in a health care provider's office, this is called masked hypertension. Most people with this condition may need medicines to control blood pressure. If you have a high blood pressure reading during one visit or you have  normal blood pressure with other risk factors, you may be asked to:  Return on a different day to have your blood pressure checked again.  Monitor your blood pressure at home for 1 week or longer. If you are diagnosed with hypertension, you may have other blood or imaging tests to help your health care provider understand your overall risk for other conditions. How is this treated? This condition is treated by making healthy lifestyle changes, such as eating healthy foods, exercising more, and reducing your alcohol intake. Your health care provider may prescribe medicine if lifestyle changes are not enough to get your blood pressure under control, and if:  Your systolic blood pressure is above 130.  Your diastolic blood pressure is above 80. Your personal target blood pressure may vary depending on your medical conditions, your age, and other factors. Follow these instructions at home: Eating and drinking   Eat a diet that is high in fiber and potassium, and low in sodium, added sugar, and fat. An example eating plan is called the DASH (Dietary Approaches to Stop Hypertension) diet. To eat this way: ? Eat plenty of fresh fruits and vegetables. Try to fill one half of your plate at each meal with fruits and vegetables. ? Eat whole grains, such as whole-wheat pasta, brown rice, or whole-grain bread. Fill about one fourth  of your plate with whole grains. ? Eat or drink low-fat dairy products, such as skim milk or low-fat yogurt. ? Avoid fatty cuts of meat, processed or cured meats, and poultry with skin. Fill about one fourth of your plate with lean proteins, such as fish, chicken without skin, beans, eggs, or tofu. ? Avoid pre-made and processed foods. These tend to be higher in sodium, added sugar, and fat.  Reduce your daily sodium intake. Most people with hypertension should eat less than 1,500 mg of sodium a day.  Do not drink alcohol if: ? Your health care provider tells you not to  drink. ? You are pregnant, may be pregnant, or are planning to become pregnant.  If you drink alcohol: ? Limit how much you use to:  0-1 drink a day for women.  0-2 drinks a day for men. ? Be aware of how much alcohol is in your drink. In the [place] one drink equals one 12 oz bottle of beer (355 mL), one 5 oz glass of wine (148 mL), or one 1 oz glass of hard liquor (44 mL). Lifestyle   Work with your health care provider to maintain a healthy body weight or to lose weight. Ask what an ideal weight is for you.  Get at least 30 minutes of exercise most days of the week. Activities may include walking, swimming, or biking.  Include exercise to strengthen your muscles (resistance exercise), such as Pilates or lifting weights, as part of your weekly exercise routine. Try to do these types of exercises for 30 minutes at least 3 days a week.  Do not use any products that contain nicotine or tobacco, such as cigarettes, e-cigarettes, and chewing tobacco. If you need help quitting, ask your health care provider.  Monitor your blood pressure at home as told by your health care provider.  Keep all follow-up visits as told by your health care provider. This is important. Medicines  Take over-the-counter and prescription medicines only as told by your health care provider. Follow directions carefully. Blood pressure medicines must be taken as prescribed.  Do not skip doses of blood pressure medicine. Doing this puts you at risk for problems and can make the medicine less effective.  Ask your health care provider about side effects or reactions to medicines that you should watch for. Contact a health care provider if you:  Think you are having a reaction to a medicine you are taking.  Have headaches that keep coming back (recurring).  Feel dizzy.  Have swelling in your ankles.  Have trouble with your vision. Get help right away if you:  Develop a severe headache or confusion.  Have  unusual weakness or numbness.  Feel faint.  Have severe pain in your chest or abdomen.  Vomit repeatedly.  Have trouble breathing. Summary  Hypertension is when the force of blood pumping through your arteries is too strong. If this condition is not controlled, it may put you at risk for serious complications.  Your personal target blood pressure may vary depending on your medical conditions, your age, and other factors. For most people, a normal blood pressure is less than 120/80.  Hypertension is treated with lifestyle changes, medicines, or a combination of both. Lifestyle changes include losing weight, eating a healthy, low-sodium diet, exercising more, and limiting alcohol. This information is not intended to replace advice given to you by your health care provider. Make sure you discuss any questions you have with your health care provider. Document  Revised: 11/16/2017 Document Reviewed: 11/16/2017 Elsevier Patient Education  [place].  Chronic Kidney Disease, Adult Chronic kidney disease (CKD) happens when the kidneys are damaged over a long period of time. The kidneys are two organs that help with:  Getting rid of waste and extra fluid from the blood.  Making hormones that maintain the amount of fluid in your tissues and blood vessels.  Making sure that the body has the right amount of fluids and chemicals. Most of the time, CKD does not go away, but it can usually be controlled. Steps must be taken to slow down the kidney damage or to stop it from getting worse. If this is not done, the kidneys may stop working. Follow these instructions at home: Medicines  Take over-the-counter and prescription medicines only as told by your doctor. You may need to change the amount of medicines you take.  Do not take any new medicines unless your doctor says it is okay. Many medicines can make your kidney damage worse.  Do not take any vitamin and supplements unless your  doctor says it is okay. Many vitamins and supplements can make your kidney damage worse. General instructions  Follow a diet as told by your doctor. You may need to stay away from: ? Alcohol. ? Salty foods. ? Foods that are high in:  Potassium.  Calcium.  Protein.  Do not use any products that contain nicotine or tobacco, such as cigarettes and e-cigarettes. If you need help quitting, ask your doctor.  Keep track of your blood pressure at home. Tell your doctor about any changes.  If you have diabetes, keep track of your blood sugar as told by your doctor.  Try to stay at a healthy weight. If you need help, ask your doctor.  Exercise at least 30 minutes a day, 5 days a week.  Stay up-to-date with your shots (immunizations) as told by your doctor.  Keep all follow-up visits as told by your doctor. This is important. Contact a doctor if:  Your symptoms get worse.  You have new symptoms. Get help right away if:  You have symptoms of end-stage kidney disease. These may include: ? Headaches. ? Numbness in your hands or feet. ? Easy bruising. ? Having hiccups often. ? Chest pain. ? Shortness of breath. ? Stopping of menstrual periods in women.  You have a fever.  You have very little pee (urine).  You have pain or bleeding when you pee. Summary  Chronic kidney disease (CKD) happens when the kidneys are damaged over a long period of time.  Most of the time, this condition does not go away, but it can usually be controlled. Steps must be taken to slow down the kidney damage or to stop it from getting worse.  Treatment may include a combination of medicines and lifestyle changes. This information is not intended to replace advice given to you by your health care provider. Make sure you discuss any questions you have with your health care provider. Document Revised: 02/18/2017 Document Reviewed: 04/12/2016 Elsevier Patient Education  2020 [employer].       Agustina Caroli, MD [facility]

## 2019-05-16 ENCOUNTER — Other Ambulatory Visit: Payer: Self-pay

## 2019-05-16 ENCOUNTER — Encounter: Payer: Self-pay | Admitting: Neurology

## 2019-05-16 ENCOUNTER — Ambulatory Visit: Payer: Medicare PPO | Admitting: Neurology

## 2019-05-16 VITALS — BP 103/65 | HR 49 | Temp 97.0°F | Ht 69.0 in | Wt 209.0 lb

## 2019-05-16 DIAGNOSIS — R413 Other amnesia: Secondary | ICD-10-CM | POA: Diagnosis not present

## 2019-05-16 DIAGNOSIS — G3184 Mild cognitive impairment, so stated: Secondary | ICD-10-CM

## 2019-05-16 MED ORDER — CEREFOLIN 6-1-50-5 MG PO TABS: 1.0000 | ORAL_TABLET | Freq: Every morning | ORAL | 3 refills | Status: DC

## 2019-05-16 NOTE — Progress Notes (Addendum)
[facility] [address] [place]. [place] [address] [phone]       OFFICE CONSULT NOTE  [name] Date of Birth:  09/16/1950 Medical Record Number:  [id number]   Referring MD: Christopher [name] Reason for Referral: Memory loss  HPI: [name] is a 69 year old male seen today for initial office consultation visit for memory loss.  History is obtained from the patient, review of referral notes and electronic medical records and review of available imaging films in PACS.  He is a 69 year old male with past medical history of hypertension anxiety and depression.  He states for the last year and a half he has had memory and cognitive difficulties.  He states he gets distractible easily and forgets what he is doing in the middle of a task if is interrupted.  He gets confused also at times.  He had a chance to forget appointments and has now learned to write things down and be more organized.  He finds it is very annoying and gets frustrated and upset easily.  He feels this difficulties seem to have not progressed but they are annoying.  He denies any headache, slurred speech, focal extremity weakness, gait or balance problem.  He denies significant head injury with loss of consciousness, seizures, migraines or stroke or TIAs.  Review of PACS shows that he had a CT scan of the head on 08/03/2008 which was normal.  He denies any family history of Alzheimer's disease or dementia.  Patient states is quite independent and active daily livings.  In fact takes care of his wife who has had a stroke as well as 3 grandkids as his son lives with him and works during the day.  He does have history of anxiety and depression which is longstanding but he is feels he is on medications which is controlling it well.  He does admit to being stressed with all that he has to do at his age to manage his home.  ROS:   14 system review of systems is positive for memory loss, easy distractibility,  decreased attention, difficulty multitasking all other systems negative PMH:  Past Medical History:  Diagnosis Date  . Anxiety   . Cancer ([place])   . Depression   . Hypertension     Social History:  Social History   Socioeconomic History  . Marital status: Single    Spouse name: Not on file  . Number of children: Not on file  . Years of education: Not on file  . Highest education level: Not on file  Occupational History  . Not on file  Tobacco Use  . Smoking status: Former Smoker    Quit date: 03/22/1972    Years since quitting: 47.1  . Smokeless tobacco: Never Used  Substance and Sexual Activity  . Alcohol use: Yes    Alcohol/week: 0.0 standard drinks    Comment: rare  . Drug use: No  . Sexual activity: Not on file  Other Topics Concern  . Not on file  Social History Narrative  . Not on file   Social Determinants of Health   Financial Resource Strain:   . Difficulty of Paying Living Expenses: Not on file  Food Insecurity:   . Worried About Charity fundraiser in the Last Year: Not on file  . Ran Out of Food in the Last Year: Not on file  Transportation Needs:   . Lack of Transportation (Medical): Not on file  . Lack of Transportation (Non-Medical):  Not on file  Physical Activity:   . Days of Exercise per Week: Not on file  . Minutes of Exercise per Session: Not on file  Stress:   . Feeling of Stress : Not on file  Social Connections:   . Frequency of Communication with Friends and Family: Not on file  . Frequency of Social Gatherings with Friends and Family: Not on file  . Attends Religious Services: Not on file  . Active Member of Clubs or Organizations: Not on file  . Attends Archivist Meetings: Not on file  . Marital Status: Not on file  Intimate Partner Violence:   . Fear of Current or Ex-Partner: Not on file  . Emotionally Abused: Not on file  . Physically Abused: Not on file  . Sexually Abused: Not on file    Medications:   Current  Outpatient Medications on File Prior to Visit  Medication Sig Dispense Refill  . ALPRAZolam (XANAX) 0.5 MG tablet Take 1 tablet (0.5 mg total) by mouth 2 (two) times daily as needed for anxiety. 30 tablet 0  . amLODipine (NORVASC) 5 MG tablet Take 1 tablet (5 mg total) by mouth daily. 90 tablet 3  . ASPIRIN 81 PO Take by mouth.    Marland Kitchen lisinopril (ZESTRIL) 10 MG tablet TAKE 1 TABLET BY MOUTH DAILY 90 tablet 3  . Multiple Vitamin (MULTIVITAMIN) tablet Take 1 tablet by mouth daily.    . Omega-3 Fatty Acids (FISH OIL PO) Take 500 mg by mouth.    Marland Kitchen PARoxetine (PAXIL) 20 MG tablet Take 1 tablet (20 mg total) by mouth daily. 90 tablet 3   No current facility-administered medications on file prior to visit.    Allergies:  No Known Allergies  Physical Exam General: well developed, well nourished middle-aged male, seated, in no evident distress Head: head normocephalic and atraumatic.   Neck: supple with no carotid or supraclavicular bruits Cardiovascular: regular rate and rhythm, no murmurs Musculoskeletal: no deformity Skin:  no rash/petichiae Vascular:  Normal pulses all extremities  Neurologic Exam Mental Status: Awake and fully alert. Oriented to place and time. Recent and remote memory poor. Attention span, concentration and fund of knowledge appropriate. Mood and affect appropriate.  Mini-Mental status exam score 25/30 with deficits in recall attention and calculation.  Able to name 8 animals which can walk on 4 legs.  Clock drawing 4/4.  Able to copy intersecting pentagons well.  On geriatric depression scale he scored only 4 which is not suggestive of significant depression Cranial Nerves: Fundoscopic exam reveals sharp disc margins. Pupils equal, briskly reactive to light. Extraocular movements full without nystagmus. Visual fields full to confrontation. Hearing intact. Facial sensation intact. Face, tongue, palate moves normally and symmetrically.  Motor: Normal bulk and tone. Normal  strength in all tested extremity muscles. Sensory.: intact to touch , pinprick , position and vibratory sensation.  Coordination: Rapid alternating movements normal in all extremities. Finger-to-nose and heel-to-shin performed accurately bilaterally. Gait and Station: Arises from chair without difficulty. Stance is normal. Gait demonstrates normal stride length and balance . Able to heel, toe and tandem walk with mild t difficulty.  Reflexes: 1+ and symmetric. Toes downgoing.      ASSESSMENT: 69 year old  male with 1-1/2 year history of memory and cognitive difficulties likely due to mild cognitive impairment.    PLAN: I had a long discussion with the patient with regards to his mild memory and cognitive difficulties which are likely due to mild cognitive impairment.  I recommend  further evaluation by checking memory panel labs for reversible causes,  EEG and MRI scan of the brain.  Start Cerefolin NAC for cognitive impairment as well as increase participation in cognitively challenging activities like solving crossword puzzles, playing bridge and sodoku.  We also discussed memory compensation strategies.  He will continue medications for his anxiety and depression as per his primary care physician.  Greater than 50% time during this 50-minute consultation visit was spent on counseling and coordination of care about his memory loss and mild cognitive impairment and answering questions he will return for follow-up in the future in 2 months or call earlier if necessary. Antony Contras, MD Medical Director [facility] Pager: [phone] 05/16/2019 4:34 PM    Note: This document was prepared with digital dictation and possible smart phrase technology. Any transcriptional errors that result from this process are unintentional.

## 2019-05-16 NOTE — Patient Instructions (Addendum)
I had a long discussion with the patient with regards to his mild memory and cognitive difficulties which are likely due to mild cognitive impairment.  I recommend further evaluation by checking memory panel labs for reversible causes,  EEG and MRI scan of the brain.  Start Cerefolin NAC for cognitive impairment as well as increase participation in cognitively challenging activities like solving crossword puzzles, playing bridge and sodoku.  We also discussed memory compensation strategies.  He will continue medications for his anxiety and depression as per his primary care physician.  He will return for follow-up in the future in 2 months or call earlier if necessary. Memory Compensation Strategies  1. Use "WARM" strategy.  W= write it down  A= associate it  R= repeat it  M= make a mental note  2.   You can keep a Social worker.  Use a 3-ring notebook with sections for the following: calendar, important names and phone numbers,  medications, doctors' names/phone numbers, lists/reminders, and a section to journal what you did  each day.   3.    Use a calendar to write appointments down.  4.    Write yourself a schedule for the day.  This can be placed on the calendar or in a separate section of the Memory Notebook.  Keeping a  regular schedule can help memory.  5.    Use medication organizer with sections for each day or morning/evening pills.  You may need help loading it  6.    Keep a basket, or pegboard by the door.  Place items that you need to take out with you in the basket or on the pegboard.  You may also want to  include a message board for reminders.  7.    Use sticky notes.  Place sticky notes with reminders in a place where the task is performed.  For example: " turn off the  stove" placed by the stove, "lock the door" placed on the door at eye level, " take your medications" on  the bathroom mirror or by the place where you normally take your medications.  8.    Use  alarms/timers.  Use while cooking to remind yourself to check on food or as a reminder to take your medicine, or as a  reminder to make a call, or as a reminder to perform another task, etc.

## 2019-05-23 ENCOUNTER — Telehealth: Payer: Self-pay | Admitting: Neurology

## 2019-05-23 NOTE — Telephone Encounter (Signed)
[employer] pending faxed notes.  

## 2019-05-23 NOTE — Telephone Encounter (Signed)
Christopher [name]: [id number] (exp. 05/23/19 to 06/22/19) order sent to [facility]. They will reach out to the patient to schedule.

## 2019-05-31 ENCOUNTER — Other Ambulatory Visit: Payer: Self-pay

## 2019-05-31 ENCOUNTER — Ambulatory Visit: Payer: Medicare PPO

## 2019-05-31 DIAGNOSIS — R410 Disorientation, unspecified: Secondary | ICD-10-CM | POA: Diagnosis not present

## 2019-05-31 DIAGNOSIS — R413 Other amnesia: Secondary | ICD-10-CM

## 2019-06-11 NOTE — Progress Notes (Signed)
   [facility]  EEG (ELECTROENCEPHALOGRAM) REPORT   STUDY DATE: 05/31/2019 PATIENT NAME: Christopher [name] DOB: Oct 31, 1950 MRN: [id number]  ORDERING CLINICIAN: Antony Contras, MD  TECHNOLOGIST: Nonda Lou, RPSGT TECHNIQUE: Electroencephalogram was recorded utilizing standard 10-20 system of lead placement and reformatted into average and bipolar montages.  RECORDING TIME: 26 minutes 49 seconds  CLINICAL INFORMATION: 69 year old man with cognitive decline  FINDINGS: A digital EEG was performed while the patient was awake and drowsy. While awake and most alert there was a 10 hz posterior dominant rhythm. Voltages and frequencies were symmetric.  There were no focal, lateralizing, epileptiform activity or seizures seen.  Photic stimulation had a normal driving response. Hyperventilation and recovery did not change the underlying rhythms. EKG channel shows normal sinus rhythm.  Drowsiness and sleep was noted near the end of the recording.  IMPRESSION: This is a normal EEG while the patient was awake and asleep   INTERPRETING PHYSICIAN:   Eduard Penkala A. Felecia Shelling, MD, PhD, [facility] Certified in Neurology, Clinical Neurophysiology, Sleep Medicine, Pain Medicine and Neuroimaging  [facility] [address], [place], [place] [address] [phone]

## 2019-06-28 ENCOUNTER — Telehealth: Payer: Self-pay

## 2019-06-28 NOTE — Telephone Encounter (Signed)
I called pt that EEG was normal. Pt verbalized understanding.

## 2019-06-28 NOTE — Telephone Encounter (Signed)
Sater, Nanine Means, MD  Marval Regal, RN  Please let them know that the EEG was normal

## 2019-06-28 NOTE — Telephone Encounter (Signed)
-----   Message from Britt Bottom, MD sent at 06/11/2019  7:09 PM EDT ----- Please let them know that the EEG was normal.

## 2019-09-05 DIAGNOSIS — I129 Hypertensive chronic kidney disease with stage 1 through stage 4 chronic kidney disease, or unspecified chronic kidney disease: Secondary | ICD-10-CM | POA: Diagnosis not present

## 2019-09-05 DIAGNOSIS — N184 Chronic kidney disease, stage 4 (severe): Secondary | ICD-10-CM | POA: Diagnosis not present

## 2019-09-05 DIAGNOSIS — N281 Cyst of kidney, acquired: Secondary | ICD-10-CM | POA: Diagnosis not present

## 2019-09-07 ENCOUNTER — Other Ambulatory Visit: Payer: Self-pay | Admitting: Nephrology

## 2019-09-07 DIAGNOSIS — N184 Chronic kidney disease, stage 4 (severe): Secondary | ICD-10-CM

## 2019-09-11 ENCOUNTER — Ambulatory Visit
Admission: RE | Admit: 2019-09-11 | Discharge: 2019-09-11 | Disposition: A | Discharge status: Home / Self Care | Payer: Medicare Other | Source: Ambulatory Visit | Attending: Nephrology | Admitting: Nephrology

## 2019-09-11 DIAGNOSIS — N184 Chronic kidney disease, stage 4 (severe): Secondary | ICD-10-CM | POA: Diagnosis not present

## 2019-09-11 DIAGNOSIS — Z8551 Personal history of malignant neoplasm of bladder: Secondary | ICD-10-CM | POA: Diagnosis not present

## 2019-09-11 DIAGNOSIS — N281 Cyst of kidney, acquired: Secondary | ICD-10-CM | POA: Diagnosis not present

## 2019-10-11 ENCOUNTER — Encounter: Payer: Self-pay | Admitting: Emergency Medicine

## 2019-10-11 ENCOUNTER — Encounter: Payer: Self-pay | Admitting: Internal Medicine

## 2019-10-11 ENCOUNTER — Other Ambulatory Visit: Payer: Self-pay

## 2019-10-11 ENCOUNTER — Ambulatory Visit: Payer: Medicare PPO | Admitting: Emergency Medicine

## 2019-10-11 VITALS — BP 112/68 | HR 58 | Temp 97.7°F | Ht 69.0 in | Wt 203.8 lb

## 2019-10-11 DIAGNOSIS — I10 Essential (primary) hypertension: Secondary | ICD-10-CM

## 2019-10-11 DIAGNOSIS — N184 Chronic kidney disease, stage 4 (severe): Secondary | ICD-10-CM

## 2019-10-11 DIAGNOSIS — Z1211 Encounter for screening for malignant neoplasm of colon: Secondary | ICD-10-CM | POA: Diagnosis not present

## 2019-10-11 DIAGNOSIS — F325 Major depressive disorder, single episode, in full remission: Secondary | ICD-10-CM | POA: Diagnosis not present

## 2019-10-11 MED ORDER — PAROXETINE HCL 20 MG PO TABS: 20.0000 mg | ORAL_TABLET | Freq: Every day | ORAL | 3 refills | Status: DC

## 2019-10-11 MED ORDER — LISINOPRIL 10 MG PO TABS: ORAL_TABLET | ORAL | 3 refills | Status: DC

## 2019-10-11 MED ORDER — AMLODIPINE BESYLATE 5 MG PO TABS: 5.0000 mg | ORAL_TABLET | Freq: Every day | ORAL | 3 refills | Status: DC

## 2019-10-11 NOTE — Progress Notes (Signed)
Christopher [name] 69 y.o.   Chief Complaint  Patient presents with  . Follow-up    x6 mos on chronic medical conditions  Neurology evaluation for cognitive difficulties 05/16/2019 as follows:  ASSESSMENT: 69 year old  male with 1-1/2 year history of memory and cognitive difficulties likely due to mild cognitive impairment.    PLAN: I had a long discussion with the patient with regards to his mild memory and cognitive difficulties which are likely due to mild cognitive impairment.  I recommend further evaluation by checking memory panel labs for reversible causes,  EEG and MRI scan of the brain.  Start Cerefolin NAC for cognitive impairment as well as increase participation in cognitively challenging activities like solving crossword puzzles, playing bridge and sodoku.  We also discussed memory compensation strategies.  He will continue medications for his anxiety and depression as per his primary care physician.  Greater than 50% time during this 50-minute consultation visit was spent on counseling and coordination of care about his memory loss and mild cognitive impairment and answering questions he will return for follow-up in the future in 2 months or call earlier if necessary. Antony Contras, MD Medical Director [facility] Pager: [phone] 05/16/2019 4:34 PM  HISTORY OF PRESENT ILLNESS:  This is a 69 y.o. male here today for follow-up on chronic medical problems. Last office visit January 2021 as follows: HISTORY OF PRESENT ILLNESS: This is a 69 y.o. male with multiple chronic medical problems here for follow-up: 1.  Hypertension: On amlodipine 5 mg and lisinopril 10 mg daily.  Doing well. 2.  Chronic depression: On Paxil 20 mg daily.  Working well.  Increase stressors at home with wife's recent CVA and taking care of 3 grandkids since last August.  Not sleeping more than 5 hours at night.  Feels "tired all the time".  3.  Chronic kidney disease with GFR of 27 and  polycystic kidneys.  Seen nephrologist with follow-up appointment in 2 to 3 months. Renal ultrasound done last September: IMPRESSION: 1. Echogenic kidneys consistent with medical renal disease, renal size is somewhat small suggesting a degree of atrophy. No hydronephrosis 2. Cysts, fewer than 10 within the right kidney. Upper pole lesion is slightly complex.  4.  Today complaining of lapses of memory loss for the past 3 months.  Forgetful  Today feeling a lot better.  Hypertension under control.  Chronic depression under control.  Chronic kidney disease stage IV.  Sees nephrologist on a regular basis. Cognitive issues addressed by neurologist February 2021.  Office visit note reviewed.  See above.   HPI   Prior to Admission medications   Medication Sig Start Date End Date Taking? Authorizing Provider  ALPRAZolam Duanne Moron) 0.5 MG tablet Take 1 tablet (0.5 mg total) by mouth 2 (two) times daily as needed for anxiety. 09/10/17  Yes Estefany Goebel, Ines Bloomer, MD  amLODipine (NORVASC) 5 MG tablet Take 1 tablet (5 mg total) by mouth daily. 10/11/18 10/11/19 Yes SagardiaInes Bloomer, MD  ASPIRIN 81 PO Take by mouth.   Yes [provider]  L-Methylfolate-B12-B6-B2 (CEREFOLIN) 08-20-48-5 MG TABS Take 1 tablet by mouth every morning. 05/16/19  Yes Garvin Fila, MD  lisinopril (ZESTRIL) 10 MG tablet TAKE 1 TABLET BY MOUTH DAILY 10/11/18  Yes Kenneth Lax, Ines Bloomer, MD  Multiple Vitamin (MULTIVITAMIN) tablet Take 1 tablet by mouth daily.   Yes [provider]  Omega-3 Fatty Acids (FISH OIL PO) Take 500 mg by mouth.   Yes [provider]  PARoxetine (PAXIL) 20 MG tablet Take 1 tablet (20 mg total) by mouth daily. 10/11/18 10/11/19 Yes SagardiaInes Bloomer, MD    No Known Allergies  Patient Active Problem List   Diagnosis Date Noted  . Stage 4 chronic kidney disease ([place]) 04/16/2019  . Memory deficits 04/16/2019  . Kidney cysts 04/16/2019  . Depression 08/26/2016  .  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 HTN (hypertension) 11/21/2011    Past Medical History:  Diagnosis Date  . Anxiety   . Cancer ([place])   . Depression   . Hypertension     Past Surgical History:  Procedure Laterality Date  . BLADDER SURGERY    . JOINT REPLACEMENT  2016    Social History   Socioeconomic History  . Marital status: Married    Spouse name: Not on file  . Number of children: Not on file  . Years of education: Not on file  . Highest education level: Not on file  Occupational History  . Not on file  Tobacco Use  . Smoking status: Former Smoker    Quit date: 03/22/1972    Years since quitting: 47.5  . Smokeless tobacco: Never Used  Substance and Sexual Activity  . Alcohol use: Yes    Alcohol/week: 0.0 standard drinks    Comment: rare  . Drug use: No  . Sexual activity: Not on file  Other Topics Concern  . Not on file  Social History Narrative  . Not on file   Social Determinants of Health   Financial Resource Strain:   . Difficulty of Paying Living Expenses:   Food Insecurity:   . Worried About Charity fundraiser in the Last Year:   . Arboriculturist in the Last Year:   Transportation Needs:   . Film/video editor (Medical):   Marland Kitchen Lack of Transportation (Non-Medical):   Physical Activity:   . Days of Exercise per Week:   . Minutes of Exercise per Session:   Stress:   . Feeling of Stress :   Social Connections:   . Frequency of Communication with Friends and Family:   . Frequency of Social Gatherings with Friends and Family:   . Attends Religious Services:   . Active Member of Clubs or Organizations:   . Attends Archivist Meetings:   Marland Kitchen Marital Status:   Intimate Partner Violence:   . Fear of Current or Ex-Partner:   . Emotionally Abused:   Marland Kitchen Physically Abused:   . Sexually Abused:     Family History  Problem Relation Age of Onset  . Cancer Mother   . Cancer Father   . Hypertension Father   . Heart  disease Father   . Cancer Sister   . Diabetes Sister   . Heart disease Sister   . Hypertension Sister   . Mental illness Brother   . Colon cancer Neg Hx      Review of Systems  Constitutional: Negative.  Negative for chills and fever.  HENT: Negative.  Negative for congestion and sore throat.   Respiratory: Negative.  Negative for cough and shortness of breath.   Cardiovascular: Negative.  Negative for chest pain and palpitations.  Gastrointestinal: Negative.  Negative for abdominal pain, diarrhea, nausea and vomiting.  Genitourinary: Negative.  Negative for dysuria and hematuria.  Skin: Negative.  Negative for rash.  Neurological: Negative.  Negative for dizziness and headaches.  All other systems reviewed and are negative.  Today's Vitals  10/11/19 1000  BP: 112/68  Pulse: (!) 58  Temp: 97.7 F (36.5 C)  TempSrc: Temporal  SpO2: 98%  Weight: 203 lb 12.8 oz (92.4 kg)  Height: 5\' 9"  (1.753 m)   Body mass index is 30.1 kg/m.   Physical Exam Vitals reviewed.  Constitutional:      Appearance: Normal appearance.  HENT:     Head: Normocephalic.  Eyes:     Extraocular Movements: Extraocular movements intact.     Conjunctiva/sclera: Conjunctivae normal.     Pupils: Pupils are equal, round, and reactive to light.  Cardiovascular:     Rate and Rhythm: Normal rate and regular rhythm.     Pulses: Normal pulses.     Heart sounds: Normal heart sounds.  Pulmonary:     Effort: Pulmonary effort is normal.     Breath sounds: Normal breath sounds.  Musculoskeletal:        General: Normal range of motion.     Cervical back: Normal range of motion and neck supple.  Skin:    General: Skin is warm and dry.  Neurological:     General: No focal deficit present.     Mental Status: He is alert and oriented to person, place, and time.  Psychiatric:        Mood and Affect: Mood normal.        Behavior: Behavior normal.    A total of 30 minutes was spent with the patient,  greater than 50% of which was in counseling/coordination of care regarding multiple chronic medical problems, management and review of all medications, review of most recent office visit notes, review of most recent blood work results, diet and nutrition, prognosis and need for follow-up.   ASSESSMENT & PLAN: Clinically stable.  No medical concerns identified during this visit.  Continue present medications.  No changes. Kanai was seen today for follow-up.  Diagnoses and all orders for this visit:  Essential hypertension -     lisinopril (ZESTRIL) 10 MG tablet; TAKE 1 TABLET BY MOUTH DAILY -     amLODipine (NORVASC) 5 MG tablet; Take 1 tablet (5 mg total) by mouth daily.  Screening for colon cancer -     Ambulatory referral to Gastroenterology  Major depressive disorder with single episode, in full remission ([place]) -     PARoxetine (PAXIL) 20 MG tablet; Take 1 tablet (20 mg total) by mouth daily.  CKD (chronic kidney disease) stage 4, GFR 15-29 ml/min ([facility])    Patient Instructions       If you have lab work done today you will be contacted with your lab results within the next 2 weeks.  If you have not heard from [place] then please contact us. The fastest way to get your results is to register for My Chart.   IF you received an x-ray today, you will receive an invoice from [facility]. Please contact [facility] at [phone] with questions or concerns regarding your invoice.   IF you received labwork today, you will receive an invoice from [facility]. Please contact [facility] at [phone] with questions or concerns regarding your invoice.   Our billing staff will not be able to assist you with questions regarding bills from these companies.  You will be contacted with the lab results as soon as they are available. The fastest way to get your results is to activate your My Chart account. Instructions are located on the last page of this paperwork. If you have not  heard from [place]  regarding the results in 2 weeks, please contact this office.      Hypertension, Adult High blood pressure (hypertension) is when the force of blood pumping through the arteries is too strong. The arteries are the blood vessels that carry blood from the heart throughout the body. Hypertension forces the heart to work harder to pump blood and may cause arteries to become narrow or stiff. Untreated or uncontrolled hypertension can cause a heart attack, heart failure, a stroke, kidney disease, and other problems. A blood pressure reading consists of a higher number over a lower number. Ideally, your blood pressure should be below 120/80. The first ("top") number is called the systolic pressure. It is a measure of the pressure in your arteries as your heart beats. The second ("bottom") number is called the diastolic pressure. It is a measure of the pressure in your arteries as the heart relaxes. What are the causes? The exact cause of this condition is not known. There are some conditions that result in or are related to high blood pressure. What increases the risk? Some risk factors for high blood pressure are under your control. The following factors may make you more likely to develop this condition:  Smoking.  Having type 2 diabetes mellitus, high cholesterol, or both.  Not getting enough exercise or physical activity.  Being overweight.  Having too much fat, sugar, calories, or salt (sodium) in your diet.  Drinking too much alcohol. Some risk factors for high blood pressure may be difficult or impossible to change. Some of these factors include:  Having chronic kidney disease.  Having a family history of high blood pressure.  Age. Risk increases with age.  Race. You may be at higher risk if you are African American.  Gender. Men are at higher risk than women before age 2. After age 75, women are at higher risk than men.  Having obstructive sleep  apnea.  Stress. What are the signs or symptoms? High blood pressure may not cause symptoms. Very high blood pressure (hypertensive crisis) may cause:  Headache.  Anxiety.  Shortness of breath.  Nosebleed.  Nausea and vomiting.  Vision changes.  Severe chest pain.  Seizures. How is this diagnosed? This condition is diagnosed by measuring your blood pressure while you are seated, with your arm resting on a flat surface, your legs uncrossed, and your feet flat on the floor. The cuff of the blood pressure monitor will be placed directly against the skin of your upper arm at the level of your heart. It should be measured at least twice using the same arm. Certain conditions can cause a difference in blood pressure between your right and left arms. Certain factors can cause blood pressure readings to be lower or higher than normal for a short period of time:  When your blood pressure is higher when you are in a health care provider's office than when you are at home, this is called white coat hypertension. Most people with this condition do not need medicines.  When your blood pressure is higher at home than when you are in a health care provider's office, this is called masked hypertension. Most people with this condition may need medicines to control blood pressure. If you have a high blood pressure reading during one visit or you have normal blood pressure with other risk factors, you may be asked to:  Return on a different day to have your blood pressure checked again.  Monitor your blood pressure at home for 1  week or longer. If you are diagnosed with hypertension, you may have other blood or imaging tests to help your health care provider understand your overall risk for other conditions. How is this treated? This condition is treated by making healthy lifestyle changes, such as eating healthy foods, exercising more, and reducing your alcohol intake. Your health care provider may  prescribe medicine if lifestyle changes are not enough to get your blood pressure under control, and if:  Your systolic blood pressure is above 130.  Your diastolic blood pressure is above 80. Your personal target blood pressure may vary depending on your medical conditions, your age, and other factors. Follow these instructions at home: Eating and drinking   Eat a diet that is high in fiber and potassium, and low in sodium, added sugar, and fat. An example eating plan is called the DASH (Dietary Approaches to Stop Hypertension) diet. To eat this way: ? Eat plenty of fresh fruits and vegetables. Try to fill one half of your plate at each meal with fruits and vegetables. ? Eat whole grains, such as whole-wheat pasta, brown rice, or whole-grain bread. Fill about one fourth of your plate with whole grains. ? Eat or drink low-fat dairy products, such as skim milk or low-fat yogurt. ? Avoid fatty cuts of meat, processed or cured meats, and poultry with skin. Fill about one fourth of your plate with lean proteins, such as fish, chicken without skin, beans, eggs, or tofu. ? Avoid pre-made and processed foods. These tend to be higher in sodium, added sugar, and fat.  Reduce your daily sodium intake. Most people with hypertension should eat less than 1,500 mg of sodium a day.  Do not drink alcohol if: ? Your health care provider tells you not to drink. ? You are pregnant, may be pregnant, or are planning to become pregnant.  If you drink alcohol: ? Limit how much you use to:  0-1 drink a day for women.  0-2 drinks a day for men. ? Be aware of how much alcohol is in your drink. In the [place] one drink equals one 12 oz bottle of beer (355 mL), one 5 oz glass of wine (148 mL), or one 1 oz glass of hard liquor (44 mL). Lifestyle   Work with your health care provider to maintain a healthy body weight or to lose weight. Ask what an ideal weight is for you.  Get at least 30 minutes of exercise  most days of the week. Activities may include walking, swimming, or biking.  Include exercise to strengthen your muscles (resistance exercise), such as Pilates or lifting weights, as part of your weekly exercise routine. Try to do these types of exercises for 30 minutes at least 3 days a week.  Do not use any products that contain nicotine or tobacco, such as cigarettes, e-cigarettes, and chewing tobacco. If you need help quitting, ask your health care provider.  Monitor your blood pressure at home as told by your health care provider.  Keep all follow-up visits as told by your health care provider. This is important. Medicines  Take over-the-counter and prescription medicines only as told by your health care provider. Follow directions carefully. Blood pressure medicines must be taken as prescribed.  Do not skip doses of blood pressure medicine. Doing this puts you at risk for problems and can make the medicine less effective.  Ask your health care provider about side effects or reactions to medicines that you should watch for. Contact a health  care provider if you:  Think you are having a reaction to a medicine you are taking.  Have headaches that keep coming back (recurring).  Feel dizzy.  Have swelling in your ankles.  Have trouble with your vision. Get help right away if you:  Develop a severe headache or confusion.  Have unusual weakness or numbness.  Feel faint.  Have severe pain in your chest or abdomen.  Vomit repeatedly.  Have trouble breathing. Summary  Hypertension is when the force of blood pumping through your arteries is too strong. If this condition is not controlled, it may put you at risk for serious complications.  Your personal target blood pressure may vary depending on your medical conditions, your age, and other factors. For most people, a normal blood pressure is less than 120/80.  Hypertension is treated with lifestyle changes, medicines, or a  combination of both. Lifestyle changes include losing weight, eating a healthy, low-sodium diet, exercising more, and limiting alcohol. This information is not intended to replace advice given to you by your health care provider. Make sure you discuss any questions you have with your health care provider. Document Revised: 11/16/2017 Document Reviewed: 11/16/2017 Elsevier Patient Education  2020 [employer]      Agustina Caroli, MD [facility]

## 2019-10-11 NOTE — Patient Instructions (Addendum)
   If you have lab work done today you will be contacted with your lab results within the next 2 weeks.  If you have not heard from us then please contact us. The fastest way to get your results is to register for My Chart.   IF you received an x-ray today, you will receive an invoice from [facility]. Please contact  Radiology at [phone] with questions or concerns regarding your invoice.   IF you received labwork today, you will receive an invoice from [facility]. Please contact [facility] at [phone] with questions or concerns regarding your invoice.   Our billing staff will not be able to assist you with questions regarding bills from these companies.  You will be contacted with the lab results as soon as they are available. The fastest way to get your results is to activate your My Chart account. Instructions are located on the last page of this paperwork. If you have not heard from us regarding the results in 2 weeks, please contact this office.      Hypertension, Adult High blood pressure (hypertension) is when the force of blood pumping through the arteries is too strong. The arteries are the blood vessels that carry blood from the heart throughout the body. Hypertension forces the heart to work harder to pump blood and may cause arteries to become narrow or stiff. Untreated or uncontrolled hypertension can cause a heart attack, heart failure, a stroke, kidney disease, and other problems. A blood pressure reading consists of a higher number over a lower number. Ideally, your blood pressure should be below 120/80. The first ("top") number is called the systolic pressure. It is a measure of the pressure in your arteries as your heart beats. The second ("bottom") number is called the diastolic pressure. It is a measure of the pressure in your arteries as the heart relaxes. What are the causes? The exact cause of this condition is not known. There are some conditions  that result in or are related to high blood pressure. What increases the risk? Some risk factors for high blood pressure are under your control. The following factors may make you more likely to develop this condition:  Smoking.  Having type 2 diabetes mellitus, high cholesterol, or both.  Not getting enough exercise or physical activity.  Being overweight.  Having too much fat, sugar, calories, or salt (sodium) in your diet.  Drinking too much alcohol. Some risk factors for high blood pressure may be difficult or impossible to change. Some of these factors include:  Having chronic kidney disease.  Having a family history of high blood pressure.  Age. Risk increases with age.  Race. You may be at higher risk if you are African American.  Gender. Men are at higher risk than women before age 45. After age 65, women are at higher risk than men.  Having obstructive sleep apnea.  Stress. What are the signs or symptoms? High blood pressure may not cause symptoms. Very high blood pressure (hypertensive crisis) may cause:  Headache.  Anxiety.  Shortness of breath.  Nosebleed.  Nausea and vomiting.  Vision changes.  Severe chest pain.  Seizures. How is this diagnosed? This condition is diagnosed by measuring your blood pressure while you are seated, with your arm resting on a flat surface, your legs uncrossed, and your feet flat on the floor. The cuff of the blood pressure monitor will be placed directly against the skin of your upper arm at the level of your   heart. It should be measured at least twice using the same arm. Certain conditions can cause a difference in blood pressure between your right and left arms. Certain factors can cause blood pressure readings to be lower or higher than normal for a short period of time:  When your blood pressure is higher when you are in a health care provider's office than when you are at home, this is called white coat hypertension.  Most people with this condition do not need medicines.  When your blood pressure is higher at home than when you are in a health care provider's office, this is called masked hypertension. Most people with this condition may need medicines to control blood pressure. If you have a high blood pressure reading during one visit or you have normal blood pressure with other risk factors, you may be asked to:  Return on a different day to have your blood pressure checked again.  Monitor your blood pressure at home for 1 week or longer. If you are diagnosed with hypertension, you may have other blood or imaging tests to help your health care provider understand your overall risk for other conditions. How is this treated? This condition is treated by making healthy lifestyle changes, such as eating healthy foods, exercising more, and reducing your alcohol intake. Your health care provider may prescribe medicine if lifestyle changes are not enough to get your blood pressure under control, and if:  Your systolic blood pressure is above 130.  Your diastolic blood pressure is above 80. Your personal target blood pressure may vary depending on your medical conditions, your age, and other factors. Follow these instructions at home: Eating and drinking   Eat a diet that is high in fiber and potassium, and low in sodium, added sugar, and fat. An example eating plan is called the DASH (Dietary Approaches to Stop Hypertension) diet. To eat this way: ? Eat plenty of fresh fruits and vegetables. Try to fill one half of your plate at each meal with fruits and vegetables. ? Eat whole grains, such as whole-wheat pasta, brown rice, or whole-grain bread. Fill about one fourth of your plate with whole grains. ? Eat or drink low-fat dairy products, such as skim milk or low-fat yogurt. ? Avoid fatty cuts of meat, processed or cured meats, and poultry with skin. Fill about one fourth of your plate with lean proteins, such  as fish, chicken without skin, beans, eggs, or tofu. ? Avoid pre-made and processed foods. These tend to be higher in sodium, added sugar, and fat.  Reduce your daily sodium intake. Most people with hypertension should eat less than 1,500 mg of sodium a day.  Do not drink alcohol if: ? Your health care provider tells you not to drink. ? You are pregnant, may be pregnant, or are planning to become pregnant.  If you drink alcohol: ? Limit how much you use to:  0-1 drink a day for women.  0-2 drinks a day for men. ? Be aware of how much alcohol is in your drink. In the [place] one drink equals one 12 oz bottle of beer (355 mL), one 5 oz glass of wine (148 mL), or one 1 oz glass of hard liquor (44 mL). Lifestyle   Work with your health care provider to maintain a healthy body weight or to lose weight. Ask what an ideal weight is for you.  Get at least 30 minutes of exercise most days of the week. Activities may include walking, swimming,   or biking.  Include exercise to strengthen your muscles (resistance exercise), such as Pilates or lifting weights, as part of your weekly exercise routine. Try to do these types of exercises for 30 minutes at least 3 days a week.  Do not use any products that contain nicotine or tobacco, such as cigarettes, e-cigarettes, and chewing tobacco. If you need help quitting, ask your health care provider.  Monitor your blood pressure at home as told by your health care provider.  Keep all follow-up visits as told by your health care provider. This is important. Medicines  Take over-the-counter and prescription medicines only as told by your health care provider. Follow directions carefully. Blood pressure medicines must be taken as prescribed.  Do not skip doses of blood pressure medicine. Doing this puts you at risk for problems and can make the medicine less effective.  Ask your health care provider about side effects or reactions to medicines that you  should watch for. Contact a health care provider if you:  Think you are having a reaction to a medicine you are taking.  Have headaches that keep coming back (recurring).  Feel dizzy.  Have swelling in your ankles.  Have trouble with your vision. Get help right away if you:  Develop a severe headache or confusion.  Have unusual weakness or numbness.  Feel faint.  Have severe pain in your chest or abdomen.  Vomit repeatedly.  Have trouble breathing. Summary  Hypertension is when the force of blood pumping through your arteries is too strong. If this condition is not controlled, it may put you at risk for serious complications.  Your personal target blood pressure may vary depending on your medical conditions, your age, and other factors. For most people, a normal blood pressure is less than 120/80.  Hypertension is treated with lifestyle changes, medicines, or a combination of both. Lifestyle changes include losing weight, eating a healthy, low-sodium diet, exercising more, and limiting alcohol. This information is not intended to replace advice given to you by your health care provider. Make sure you discuss any questions you have with your health care provider. Document Revised: 11/16/2017 Document Reviewed: 11/16/2017 Elsevier Patient Education  2020 [employer]  

## 2019-11-29 ENCOUNTER — Other Ambulatory Visit: Payer: Self-pay

## 2019-11-29 ENCOUNTER — Ambulatory Visit (AMBULATORY_SURGERY_CENTER): Payer: Self-pay | Admitting: *Deleted

## 2019-11-29 ENCOUNTER — Encounter: Payer: Self-pay | Admitting: Internal Medicine

## 2019-11-29 VITALS — Ht 69.0 in | Wt 202.0 lb

## 2019-11-29 DIAGNOSIS — Z86010 Personal history of colonic polyps: Secondary | ICD-10-CM

## 2019-11-29 MED ORDER — SUPREP BOWEL PREP KIT 17.5-3.13-1.6 GM/177ML PO SOLN: 1.0000 | Freq: Once | ORAL | 0 refills | Status: AC

## 2019-11-29 NOTE — Progress Notes (Signed)

## 2019-12-11 ENCOUNTER — Encounter: Payer: Self-pay | Admitting: Internal Medicine

## 2019-12-11 ENCOUNTER — Other Ambulatory Visit: Payer: Self-pay

## 2019-12-11 ENCOUNTER — Ambulatory Visit (AMBULATORY_SURGERY_CENTER): Payer: Medicare PPO | Admitting: Internal Medicine

## 2019-12-11 VITALS — BP 120/69 | HR 55 | Temp 96.6°F | Resp 16 | Ht 69.0 in | Wt 202.0 lb

## 2019-12-11 DIAGNOSIS — Z86010 Personal history of colonic polyps: Secondary | ICD-10-CM | POA: Diagnosis not present

## 2019-12-11 DIAGNOSIS — D122 Benign neoplasm of ascending colon: Secondary | ICD-10-CM | POA: Diagnosis not present

## 2019-12-11 DIAGNOSIS — D124 Benign neoplasm of descending colon: Secondary | ICD-10-CM | POA: Diagnosis not present

## 2019-12-11 DIAGNOSIS — Z1211 Encounter for screening for malignant neoplasm of colon: Secondary | ICD-10-CM | POA: Diagnosis not present

## 2019-12-11 MED ORDER — SODIUM CHLORIDE 0.9 % IV SOLN: 500.0000 mL | Freq: Once | INTRAVENOUS | Status: AC

## 2019-12-11 NOTE — Op Note (Signed)
[facility] Patient Name: Christopher [name] Procedure Date: 12/11/2019 12:09 PM MRN: [id number] Endoscopist: Docia Chuck. Henrene Pastor , MD Age: 69 Referring MD:  Date of Birth: 1950/06/21 Gender: Male Account #: [id number] Procedure:                Colonoscopy with cold snare polypectomy x 2 Indications:              High risk colon cancer surveillance: Personal                            history of non-advanced adenoma. Previous                            examinations 2004 (negative, Dr. Sharlett Iles) and 2016 Medicines:                Monitored Anesthesia Care Procedure:                Pre-Anesthesia Assessment:                           - Prior to the procedure, a History and Physical                            was performed, and patient medications and                            allergies were reviewed. The patient's tolerance of                            previous anesthesia was also reviewed. The risks                            and benefits of the procedure and the sedation                            options and risks were discussed with the patient.                            All questions were answered, and informed consent                            was obtained. Prior Anticoagulants: The patient has                            taken no previous anticoagulant or antiplatelet                            agents. ASA Grade Assessment: II - A patient with                            mild systemic disease. After reviewing the risks                            and benefits, the patient was deemed in  satisfactory condition to undergo the procedure.                           After obtaining informed consent, the colonoscope                            was passed under direct vision. Throughout the                            procedure, the patient's blood pressure, pulse, and                            oxygen saturations were monitored continuously. The                             Colonoscope was introduced through the anus and                            advanced to the the cecum, identified by                            appendiceal orifice and ileocecal valve. The                            ileocecal valve, appendiceal orifice, and rectum                            were photographed. The quality of the bowel                            preparation was adequate. The colonoscopy was                            performed without difficulty. The patient tolerated                            the procedure well. The bowel preparation used was                            SUPREP via split dose instruction. Scope In: 12:17:37 PM Scope Out: 12:33:43 PM Scope Withdrawal Time: 0 hours 12 minutes 5 seconds  Total Procedure Duration: 0 hours 16 minutes 6 seconds  Findings:                 Two polyps were found in the descending colon and                            ascending colon. The polyps were 2 to 9 mm in size.                            These polyps were removed with a cold snare.                            Resection and retrieval were complete.  Multiple diverticula were found in the sigmoid                            colon.                           The exam was otherwise without abnormality on                            direct and retroflexion views. Complications:            No immediate complications. Estimated blood loss:                            None. Estimated Blood Loss:     Estimated blood loss: none. Impression:               - Two 2 to 9 mm polyps in the descending colon and                            in the ascending colon, removed with a cold snare.                            Resected and retrieved.                           - Diverticulosis in the sigmoid colon.                           - The examination was otherwise normal on direct                            and retroflexion views. Recommendation:           - Repeat  colonoscopy in 5 years for surveillance.                           - Patient has a contact number available for                            emergencies. The signs and symptoms of potential                            delayed complications were discussed with the                            patient. Return to normal activities tomorrow.                            Written discharge instructions were provided to the                            patient.                           - Resume previous diet.                           -  Continue present medications.                           - Await pathology results. Docia Chuck. Henrene Pastor, MD 12/11/2019 12:39:32 PM This report has been signed electronically.

## 2019-12-11 NOTE — Progress Notes (Signed)
Called to room to assist during endoscopic procedure.  Patient ID and intended procedure confirmed with present staff. Received instructions for my participation in the procedure from the performing physician.  

## 2019-12-11 NOTE — Progress Notes (Signed)
A and O x3. Report to RN. Tolerated MAC anesthesia well.

## 2019-12-11 NOTE — Patient Instructions (Signed)
2 polyps removed and sent to pathology.  Diverticulosis.    Resume previous medications.  Await pathology for final recommendations.  Handouts on findings given to patient.    YOU HAD AN ENDOSCOPIC PROCEDURE TODAY AT [facility]:   Refer to the procedure report that was given to you for any specific questions about what was found during the examination.  If the procedure report does not answer your questions, please call your gastroenterologist to clarify.  If you requested that your care partner not be given the details of your procedure findings, then the procedure report has been included in a sealed envelope for you to review at your convenience later.  YOU SHOULD EXPECT: Some feelings of bloating in the abdomen. Passage of more gas than usual.  Walking can help get rid of the air that was put into your GI tract during the procedure and reduce the bloating. If you had a lower endoscopy (such as a colonoscopy or flexible sigmoidoscopy) you may notice spotting of blood in your stool or on the toilet paper. If you underwent a bowel prep for your procedure, you may not have a normal bowel movement for a few days.  Please Note:  You might notice some irritation and congestion in your nose or some drainage.  This is from the oxygen used during your procedure.  There is no need for concern and it should clear up in a day or so.  SYMPTOMS TO REPORT IMMEDIATELY:   Following lower endoscopy (colonoscopy or flexible sigmoidoscopy):  Excessive amounts of blood in the stool  Significant tenderness or worsening of abdominal pains  Swelling of the abdomen that is new, acute  Fever of 100F or higher   For urgent or emergent issues, a gastroenterologist can be reached at any hour by calling [phone]. Do not use MyChart messaging for urgent concerns.    DIET:  We do recommend a small meal at first, but then you may proceed to your regular diet.  Drink plenty of fluids but you should  avoid alcoholic beverages for 24 hours.  ACTIVITY:  You should plan to take it easy for the rest of today and you should NOT DRIVE or use heavy machinery until tomorrow (because of the sedation medicines used during the test).    FOLLOW UP: Our staff will call the number listed on your records 48-72 hours following your procedure to check on you and address any questions or concerns that you may have regarding the information given to you following your procedure. If we do not reach you, we will leave a message.  We will attempt to reach you two times.  During this call, we will ask if you have developed any symptoms of COVID 19. If you develop any symptoms (ie: fever, flu-like symptoms, shortness of breath, cough etc.) before then, please call [phone].  If you test positive for Covid 19 in the 2 weeks post procedure, please call and report this information to [place].    If any biopsies were taken you will be contacted by phone or by letter within the next 1-3 weeks.  Please call us at [phone] if you have not heard about the biopsies in 3 weeks.    SIGNATURES/CONFIDENTIALITY: You and/or your care partner have signed paperwork which will be entered into your electronic medical record.  These signatures attest to the fact that that the information above on your After Visit Summary has been reviewed and is understood.  Full responsibility  of the confidentiality of this discharge information lies with you and/or your care-partner.

## 2019-12-11 NOTE — Progress Notes (Signed)
VS-SH  Pt's states no medical or surgical changes since previsit or office visit.  

## 2019-12-13 ENCOUNTER — Telehealth: Payer: Self-pay

## 2019-12-13 NOTE — Telephone Encounter (Signed)
  Follow up Call-  Call back number 12/11/2019  Post procedure Call Back phone  # [phone]  Permission to leave phone message Yes  Some recent data might be hidden     Patient questions:  Do you have a fever, pain , or abdominal swelling? No. Pain Score  0 *  Have you tolerated food without any problems? Yes.    Have you been able to return to your normal activities? Yes.    Do you have any questions about your discharge instructions: Diet   No. Medications  No. Follow up visit  No.  Do you have questions or concerns about your Care? No.  Actions: * If pain score is 4 or above: No action needed, pain <4.  1. Have you developed a fever since your procedure? no  2.   Have you had an respiratory symptoms (SOB or cough) since your procedure? no  3.   Have you tested positive for COVID 19 since your procedure no  4.   Have you had any family members/close contacts diagnosed with the COVID 19 since your procedure?  no   If yes to any of these questions please route to Joylene Kairos, RN and Joella Prince, RN

## 2019-12-13 NOTE — Telephone Encounter (Signed)
No answer, unable to leave a message, B.Tomeeka Plaugher RN. 

## 2019-12-17 ENCOUNTER — Encounter: Payer: Self-pay | Admitting: Internal Medicine

## 2020-01-02 DIAGNOSIS — I129 Hypertensive chronic kidney disease with stage 1 through stage 4 chronic kidney disease, or unspecified chronic kidney disease: Secondary | ICD-10-CM | POA: Diagnosis not present

## 2020-01-02 DIAGNOSIS — N184 Chronic kidney disease, stage 4 (severe): Secondary | ICD-10-CM | POA: Diagnosis not present

## 2020-01-02 DIAGNOSIS — N281 Cyst of kidney, acquired: Secondary | ICD-10-CM | POA: Diagnosis not present

## 2020-01-02 DIAGNOSIS — Z23 Encounter for immunization: Secondary | ICD-10-CM | POA: Diagnosis not present

## 2020-02-18 ENCOUNTER — Telehealth: Payer: Self-pay | Admitting: *Deleted

## 2020-02-18 NOTE — Telephone Encounter (Signed)
Schedule AWV.  

## 2020-04-10 ENCOUNTER — Ambulatory Visit: Payer: Medicare PPO | Admitting: Emergency Medicine

## 2020-04-10 ENCOUNTER — Encounter: Payer: Self-pay | Admitting: Emergency Medicine

## 2020-04-10 ENCOUNTER — Other Ambulatory Visit: Payer: Self-pay

## 2020-04-10 VITALS — BP 130/76 | HR 52 | Temp 98.2°F | Resp 16 | Ht 69.0 in | Wt 205.0 lb

## 2020-04-10 DIAGNOSIS — N184 Chronic kidney disease, stage 4 (severe): Secondary | ICD-10-CM

## 2020-04-10 DIAGNOSIS — C679 Malignant neoplasm of bladder, unspecified: Secondary | ICD-10-CM

## 2020-04-10 DIAGNOSIS — F325 Major depressive disorder, single episode, in full remission: Secondary | ICD-10-CM | POA: Diagnosis not present

## 2020-04-10 DIAGNOSIS — I10 Essential (primary) hypertension: Secondary | ICD-10-CM

## 2020-04-10 NOTE — Patient Instructions (Addendum)
   If you have lab work done today you will be contacted with your lab results within the next 2 weeks.  If you have not heard from us then please contact us. The fastest way to get your results is to register for My Chart.   IF you received an x-ray today, you will receive an invoice from [facility]. Please contact [facility] at [phone] with questions or concerns regarding your invoice.   IF you received labwork today, you will receive an invoice from [facility]. Please contact [facility] at [phone] with questions or concerns regarding your invoice.   Our billing staff will not be able to assist you with questions regarding bills from these companies.  You will be contacted with the lab results as soon as they are available. The fastest way to get your results is to activate your My Chart account. Instructions are located on the last page of this paperwork. If you have not heard from us regarding the results in 2 weeks, please contact this office.       Health Maintenance After Age 65 After age 65, you are at a higher risk for certain long-term diseases and infections as well as injuries from falls. Falls are a major cause of broken bones and head injuries in people who are older than age 65. Getting regular preventive care can help to keep you healthy and well. Preventive care includes getting regular testing and making lifestyle changes as recommended by your health care provider. Talk with your health care provider about:  Which screenings and tests you should have. A screening is a test that checks for a disease when you have no symptoms.  A diet and exercise plan that is right for you. What should I know about screenings and tests to prevent falls? Screening and testing are the best ways to find a health problem early. Early diagnosis and treatment give you the best chance of managing medical conditions that are common after age 65. Certain conditions and  lifestyle choices may make you more likely to have a fall. Your health care provider may recommend:  Regular vision checks. Poor vision and conditions such as cataracts can make you more likely to have a fall. If you wear glasses, make sure to get your prescription updated if your vision changes.  Medicine review. Work with your health care provider to regularly review all of the medicines you are taking, including over-the-counter medicines. Ask your health care provider about any side effects that may make you more likely to have a fall. Tell your health care provider if any medicines that you take make you feel dizzy or sleepy.  Osteoporosis screening. Osteoporosis is a condition that causes the bones to get weaker. This can make the bones weak and cause them to break more easily.  Blood pressure screening. Blood pressure changes and medicines to control blood pressure can make you feel dizzy.  Strength and balance checks. Your health care provider may recommend certain tests to check your strength and balance while standing, walking, or changing positions.  Foot health exam. Foot pain and numbness, as well as not wearing proper footwear, can make you more likely to have a fall.  Depression screening. You may be more likely to have a fall if you have a fear of falling, feel emotionally low, or feel unable to do activities that you used to do.  Alcohol use screening. Using too much alcohol can affect your balance and may make you more   likely to have a fall. What actions can I take to lower my risk of falls? General instructions  Talk with your health care provider about your risks for falling. Tell your health care provider if: ? You fall. Be sure to tell your health care provider about all falls, even ones that seem minor. ? You feel dizzy, sleepy, or off-balance.  Take over-the-counter and prescription medicines only as told by your health care provider. These include any  supplements.  Eat a healthy diet and maintain a healthy weight. A healthy diet includes low-fat dairy products, low-fat (lean) meats, and fiber from whole grains, beans, and lots of fruits and vegetables. Home safety  Remove any tripping hazards, such as rugs, cords, and clutter.  Install safety equipment such as grab bars in bathrooms and safety rails on stairs.  Keep rooms and walkways well-lit. Activity  Follow a regular exercise program to stay fit. This will help you maintain your balance. Ask your health care provider what types of exercise are appropriate for you.  If you need a cane or walker, use it as recommended by your health care provider.  Wear supportive shoes that have nonskid soles.   Lifestyle  Do not drink alcohol if your health care provider tells you not to drink.  If you drink alcohol, limit how much you have: ? 0-1 drink a day for women. ? 0-2 drinks a day for men.  Be aware of how much alcohol is in your drink. In the [place] one drink equals one typical bottle of beer (12 oz), one-half glass of wine (5 oz), or one shot of hard liquor (1 oz).  Do not use any products that contain nicotine or tobacco, such as cigarettes and e-cigarettes. If you need help quitting, ask your health care provider. Summary  Having a healthy lifestyle and getting preventive care can help to protect your health and wellness after age 65.  Screening and testing are the best way to find a health problem early and help you avoid having a fall. Early diagnosis and treatment give you the best chance for managing medical conditions that are more common for people who are older than age 65.  Falls are a major cause of broken bones and head injuries in people who are older than age 65. Take precautions to prevent a fall at home.  Work with your health care provider to learn what changes you can make to improve your health and wellness and to prevent falls. This information is not intended  to replace advice given to you by your health care provider. Make sure you discuss any questions you have with your health care provider. Document Revised: 06/29/2018 Document Reviewed: 01/19/2017 Elsevier Patient Education  2021 Elsevier Inc.  

## 2020-04-10 NOTE — Progress Notes (Signed)
Christopher [name] 70 y.o.   Chief Complaint  Patient presents with  . Hypertension    Follow up 6 months     HISTORY OF PRESENT ILLNESS: This is a 70 y.o. male with history of hypertension here for follow-up. Doing well.  Has no complaints or medical concerns today. 1.  Hypertension: On amlodipine 5 mg and lisinopril 10 mg daily. 2.  Chronic kidney disease stage IV.  Sees nephrologist on a regular basis.  Scheduled to see him next month. 3.  History of depression, on Paxil 20 mg daily. Fully vaccinated against COVID without a booster yet.  HPI   Prior to Admission medications   Medication Sig Start Date End Date Taking? Authorizing Provider  ALPRAZolam Duanne Moron) 0.5 MG tablet Take 1 tablet (0.5 mg total) by mouth 2 (two) times daily as needed for anxiety. 09/10/17  Yes Horald Pollen, MD  ASPIRIN 81 PO Take by mouth.   Yes [provider]  lisinopril (ZESTRIL) 10 MG tablet TAKE 1 TABLET BY MOUTH DAILY 10/11/19  Yes Tavita Eastham, Ines Bloomer, MD  Multiple Vitamin (MULTIVITAMIN) tablet Take 1 tablet by mouth daily.   Yes [provider]  Omega-3 Fatty Acids (FISH OIL PO) Take 500 mg by mouth.   Yes [provider]  amLODipine (NORVASC) 5 MG tablet Take 1 tablet (5 mg total) by mouth daily. 10/11/19 01/09/20  Horald Pollen, MD  L-Methylfolate-B12-B6-B2 (CEREFOLIN) 08-20-48-5 MG TABS Take 1 tablet by mouth every morning. Patient not taking: Reported on 04/10/2020 05/16/19   Garvin Fila, MD  PARoxetine (PAXIL) 20 MG tablet Take 1 tablet (20 mg total) by mouth daily. 10/11/19 01/09/20  Horald Pollen, MD    No Known Allergies  Patient Active Problem List   Diagnosis Date Noted  . Stage 4 chronic kidney disease ([place]) 04/16/2019  . Memory deficits 04/16/2019  . Kidney cysts 04/16/2019  . Depression 08/26/2016  . 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 HTN (hypertension) 11/21/2011    Past Medical History:  Diagnosis Date   . Anxiety   . Cancer [facility])    bladder  cancer- stage 1- removed 2007  . Chronic kidney disease    CKD   . Depression   . Hypertension     Past Surgical History:  Procedure Laterality Date  . BLADDER SURGERY    . COLONOSCOPY    . JOINT REPLACEMENT  2016   knee right   . POLYPECTOMY      Social History   Socioeconomic History  . Marital status: Married    Spouse name: Not on file  . Number of children: Not on file  . Years of education: Not on file  . Highest education level: Not on file  Occupational History  . Not on file  Tobacco Use  . Smoking status: Former Smoker    Quit date: 03/22/1972    Years since quitting: 48.0  . Smokeless tobacco: Never Used  Substance and Sexual Activity  . Alcohol use: Yes    Alcohol/week: 0.0 standard drinks    Comment: rare  . Drug use: No  . Sexual activity: Not on file  Other Topics Concern  . Not on file  Social History Narrative  . Not on file   Social Determinants of Health   Financial Resource Strain: Not on file  Food Insecurity: Not on file  Transportation Needs: Not on file  Physical Activity: Not on file  Stress: Not on file  Social Connections:  Not on file  Intimate Partner Violence: Not on file    Family History  Problem Relation Age of Onset  . Cancer Mother   . Cancer Father   . Hypertension Father   . Heart disease Father   . Stomach cancer Father   . Cancer Sister   . Diabetes Sister   . Heart disease Sister   . Hypertension Sister   . Mental illness Brother   . Colon cancer Neg Hx   . Colon polyps Neg Hx   . Esophageal cancer Neg Hx   . Rectal cancer Neg Hx      Review of Systems  Constitutional: Negative.  Negative for chills and fever.  HENT: Negative.  Negative for congestion and sore throat.   Respiratory: Negative.  Negative for cough and shortness of breath.   Cardiovascular: Negative.  Negative for chest pain and palpitations.  Gastrointestinal: Negative.  Negative for abdominal  pain, diarrhea, nausea and vomiting.  Genitourinary: Negative.  Negative for dysuria and hematuria.  Skin: Negative.  Negative for rash.  Neurological: Negative.  Negative for dizziness and headaches.  All other systems reviewed and are negative.   Today's Vitals   04/10/20 1010  BP: 130/76  Pulse: (!) 52  Resp: 16  Temp: 98.2 F (36.8 C)  TempSrc: Temporal  SpO2: 99%  Weight: 205 lb (93 kg)  Height: 5\' 9"  (1.753 m)   Body mass index is 30.27 kg/m. Wt Readings from Last 3 Encounters:  04/10/20 205 lb (93 kg)  12/11/19 202 lb (91.6 kg)  11/29/19 202 lb (91.6 kg)   BP Readings from Last 3 Encounters:  04/10/20 130/76  12/11/19 120/69  10/11/19 112/68    Physical Exam Vitals reviewed.  Constitutional:      Appearance: Normal appearance.  HENT:     Head: Normocephalic.  Eyes:     Extraocular Movements: Extraocular movements intact.     Pupils: Pupils are equal, round, and reactive to light.  Cardiovascular:     Rate and Rhythm: Normal rate and regular rhythm.     Pulses: Normal pulses.     Heart sounds: Normal heart sounds.  Pulmonary:     Effort: Pulmonary effort is normal.     Breath sounds: Normal breath sounds.  Musculoskeletal:        General: Normal range of motion.     Cervical back: Normal range of motion.     Right lower leg: No edema.     Left lower leg: No edema.  Skin:    General: Skin is warm and dry.     Capillary Refill: Capillary refill takes less than 2 seconds.  Neurological:     General: No focal deficit present.     Mental Status: He is alert and oriented to person, place, and time.  Psychiatric:        Mood and Affect: Mood normal.        Behavior: Behavior normal.    A total of 30 minutes was spent with the patient, greater than 50% of which was in counseling/coordination of care regarding chronic medical problems including hypertension and cardiovascular risks associated with these conditions, review of all medications, review of most  recent office visit notes, review of most recent blood work results, health maintenance items, prognosis, documentation, need for follow-up.   ASSESSMENT & PLAN: HTN (hypertension) Well-controlled hypertension.  Continue present medications.  No changes. Follow-up in 6 months.  Brodi was seen today for hypertension.  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  If you have not heard from [place] then please contact us. The fastest way to get your results is to register for My Chart.   IF you received an x-ray today, you will receive an invoice from [facility]. Please contact [facility] at [phone] with questions or concerns regarding your invoice.   IF you received labwork today, you will receive an invoice from [facility]. Please contact [facility] at [phone] with questions or concerns regarding your invoice.   Our billing staff will not be able to assist you with questions regarding bills from these companies.  You will be contacted with the lab results as soon as they are available. The fastest way to get your results is to activate your My Chart account. Instructions are located on the last page of this paperwork. If you have not heard from [place] regarding the results in 2 weeks, please contact this office.     Health Maintenance After Age 31 After age 42, you are at a higher risk for certain long-term diseases and infections as well as injuries from falls. Falls are a major cause of broken bones and head injuries in people who are older than age 24. Getting regular preventive care can help to keep you healthy and well. Preventive  care includes getting regular testing and making lifestyle changes as recommended by your health care provider. Talk with your health care provider about:  Which screenings and tests you should have. A screening is a test that checks for a disease when you have no symptoms.  A diet and exercise plan that is right for you. What should I know about screenings and tests to prevent falls? Screening and testing are the best ways to find a health problem early. Early diagnosis and treatment give you the best chance of managing medical conditions that are common after age 62. Certain conditions and lifestyle choices may make you more likely to have a fall. Your health care provider may recommend:  Regular vision checks. Poor vision and conditions such as cataracts can make you more likely to have a fall. If you wear glasses, make sure to get your prescription updated if your vision changes.  Medicine review. Work with your health care provider to regularly review all of the medicines you are taking, including over-the-counter medicines. Ask your health care provider about any side effects that may make you more likely to have a fall. Tell your health care provider if any medicines that you take make you feel dizzy or sleepy.  Osteoporosis screening. Osteoporosis is a condition that causes the bones to get weaker. This can make the bones weak and cause them to break more easily.  Blood pressure screening. Blood pressure changes and medicines to control blood pressure can make you feel dizzy.  Strength and balance checks. Your health care provider may recommend certain tests to check your strength and balance while standing, walking, or changing positions.  Foot health exam. Foot pain and numbness, as well as not wearing proper footwear, can make you more likely to have a fall.  Depression screening. You may be more likely to have a  fall if you have a fear of falling, feel emotionally low, or feel unable  to do activities that you used to do.  Alcohol use screening. Using too much alcohol can affect your balance and may make you more likely to have a fall. What actions can I take to lower my risk of falls? General instructions  Talk with your health care provider about your risks for falling. Tell your health care provider if: ? You fall. Be sure to tell your health care provider about all falls, even ones that seem minor. ? You feel dizzy, sleepy, or off-balance.  Take over-the-counter and prescription medicines only as told by your health care provider. These include any supplements.  Eat a healthy diet and maintain a healthy weight. A healthy diet includes low-fat dairy products, low-fat (lean) meats, and fiber from whole grains, beans, and lots of fruits and vegetables. Home safety  Remove any tripping hazards, such as rugs, cords, and clutter.  Install safety equipment such as grab bars in bathrooms and safety rails on stairs.  Keep rooms and walkways well-lit. Activity  Follow a regular exercise program to stay fit. This will help you maintain your balance. Ask your health care provider what types of exercise are appropriate for you.  If you need a cane or walker, use it as recommended by your health care provider.  Wear supportive shoes that have nonskid soles.   Lifestyle  Do not drink alcohol if your health care provider tells you not to drink.  If you drink alcohol, limit how much you have: ? 0-1 drink a day for women. ? 0-2 drinks a day for men.  Be aware of how much alcohol is in your drink. In the [place] one drink equals one typical bottle of beer (12 oz), one-half glass of wine (5 oz), or one shot of hard liquor (1 oz).  Do not use any products that contain nicotine or tobacco, such as cigarettes and e-cigarettes. If you need help quitting, ask your health care provider. Summary  Having a healthy lifestyle and getting preventive care can help to protect your  health and wellness after age 44.  Screening and testing are the best way to find a health problem early and help you avoid having a fall. Early diagnosis and treatment give you the best chance for managing medical conditions that are more common for people who are older than age 59.  Falls are a major cause of broken bones and head injuries in people who are older than age 69. Take precautions to prevent a fall at home.  Work with your health care provider to learn what changes you can make to improve your health and wellness and to prevent falls. This information is not intended to replace advice given to you by your health care provider. Make sure you discuss any questions you have with your health care provider. Document Revised: 06/29/2018 Document Reviewed: 01/19/2017 Elsevier Patient Education  2021 Elsevier Inc.      Agustina Caroli, MD [facility]

## 2020-04-10 NOTE — Assessment & Plan Note (Signed)
Well-controlled hypertension.  Continue present medications.  No changes. Follow-up in 6 months. 

## 2020-04-29 DIAGNOSIS — I129 Hypertensive chronic kidney disease with stage 1 through stage 4 chronic kidney disease, or unspecified chronic kidney disease: Secondary | ICD-10-CM | POA: Diagnosis not present

## 2020-04-29 DIAGNOSIS — N281 Cyst of kidney, acquired: Secondary | ICD-10-CM | POA: Diagnosis not present

## 2020-04-29 DIAGNOSIS — N184 Chronic kidney disease, stage 4 (severe): Secondary | ICD-10-CM | POA: Diagnosis not present

## 2020-10-08 ENCOUNTER — Encounter: Payer: Self-pay | Admitting: Emergency Medicine

## 2020-10-08 ENCOUNTER — Other Ambulatory Visit: Payer: Self-pay

## 2020-10-08 ENCOUNTER — Ambulatory Visit: Payer: Medicare PPO | Admitting: Emergency Medicine

## 2020-10-08 VITALS — BP 122/80 | HR 58 | Temp 98.5°F | Resp 18 | Ht 69.0 in | Wt 204.0 lb

## 2020-10-08 DIAGNOSIS — F331 Major depressive disorder, recurrent, moderate: Secondary | ICD-10-CM | POA: Diagnosis not present

## 2020-10-08 DIAGNOSIS — Z125 Encounter for screening for malignant neoplasm of prostate: Secondary | ICD-10-CM

## 2020-10-08 DIAGNOSIS — N184 Chronic kidney disease, stage 4 (severe): Secondary | ICD-10-CM | POA: Diagnosis not present

## 2020-10-08 DIAGNOSIS — I10 Essential (primary) hypertension: Secondary | ICD-10-CM | POA: Diagnosis not present

## 2020-10-08 LAB — CBC WITH DIFFERENTIAL/PLATELET
Basophils Absolute: 0 10*3/uL (ref 0.0–0.1)
Basophils Relative: 0.4 % (ref 0.0–3.0)
Eosinophils Absolute: 0.5 10*3/uL (ref 0.0–0.7)
Eosinophils Relative: 5.1 % — ABNORMAL HIGH (ref 0.0–5.0)
HCT: 38.9 % — ABNORMAL LOW (ref 39.0–52.0)
Hemoglobin: 13.3 g/dL (ref 13.0–17.0)
Lymphocytes Relative: 12.8 % (ref 12.0–46.0)
Lymphs Abs: 1.2 10*3/uL (ref 0.7–4.0)
MCHC: 34.3 g/dL (ref 30.0–36.0)
MCV: 84.4 fl (ref 78.0–100.0)
Monocytes Absolute: 0.9 10*3/uL (ref 0.1–1.0)
Monocytes Relative: 9.7 % (ref 3.0–12.0)
Neutro Abs: 6.9 10*3/uL (ref 1.4–7.7)
Neutrophils Relative %: 72 % (ref 43.0–77.0)
Platelets: 344 10*3/uL (ref 150.0–400.0)
RBC: 4.6 Mil/uL (ref 4.22–5.81)
RDW: 14.4 % (ref 11.5–15.5)
WBC: 9.6 10*3/uL (ref 4.0–10.5)

## 2020-10-08 LAB — COMPREHENSIVE METABOLIC PANEL
ALT: 9 U/L (ref 0–53)
AST: 13 U/L (ref 0–37)
Albumin: 3.8 g/dL (ref 3.5–5.2)
Alkaline Phosphatase: 104 U/L (ref 39–117)
BUN: 36 mg/dL — ABNORMAL HIGH (ref 6–23)
CO2: 25 mEq/L (ref 19–32)
Calcium: 9.4 mg/dL (ref 8.4–10.5)
Chloride: 106 mEq/L (ref 96–112)
Creatinine, Ser: 2.75 mg/dL — ABNORMAL HIGH (ref 0.40–1.50)
GFR: 22.75 mL/min — ABNORMAL LOW (ref >60.00)
Glucose, Bld: 90 mg/dL (ref 70–99)
Potassium: 5.3 mEq/L — ABNORMAL HIGH (ref 3.5–5.1)
Sodium: 139 mEq/L (ref 135–145)
Total Bilirubin: 0.5 mg/dL (ref 0.2–1.2)
Total Protein: 6.3 g/dL (ref 6.0–8.3)

## 2020-10-08 LAB — LIPID PANEL
Cholesterol: 143 mg/dL (ref 0–200)
HDL: 49 mg/dL (ref >39.00)
LDL Cholesterol: 77 mg/dL (ref 0–99)
NonHDL: 94.34
Total CHOL/HDL Ratio: 3
Triglycerides: 88 mg/dL (ref 0.0–149.0)
VLDL: 17.6 mg/dL (ref 0.0–40.0)

## 2020-10-08 LAB — HEMOGLOBIN A1C: Hgb A1c MFr Bld: 5.8 % (ref 4.6–6.5)

## 2020-10-08 MED ORDER — PAROXETINE HCL 10 MG PO TABS: 10.0000 mg | ORAL_TABLET | Freq: Every day | ORAL | 1 refills | Status: DC

## 2020-10-08 NOTE — Assessment & Plan Note (Signed)
Well-controlled hypertension.  Continue lisinopril 10 mg daily. Dietary approach to stop hypertension discussed.

## 2020-10-08 NOTE — Assessment & Plan Note (Signed)
Stable per patient.  Sees nephrologist on a regular basis.  No need for dialysis yet.

## 2020-10-08 NOTE — Progress Notes (Signed)
Christopher [name] 70 y.o.   Chief Complaint  Patient presents with   6 month F/U    Fatigued for the last couple of months. No desire to do anything. Short temper.     HISTORY OF PRESENT ILLNESS: This is a 70 y.o. male with history of hypertension and stage IV kidney disease.  Here for follow-up. Has history of chronic depression and feels he is actively depressed.  Fatigue with no desire to do anything. Short tempered. Was taken off Paxil sometime ago by kidney doctor. No other complaints or medical concerns today. Depression screen [facility] 2/9 10/08/2020 04/10/2020 04/16/2019 04/16/2019 01/15/2019  Decreased Interest 1 0 0 0 0  Down, Depressed, Hopeless 1 0 0 3 0  PHQ - 2 Score 2 0 0 3 0  Altered sleeping 0 - 2 2 -  Tired, decreased energy 2 - 1 1 -  Change in appetite 1 - 0 0 -  Feeling bad or failure about yourself  0 - 0 0 -  Trouble concentrating 2 - 1 1 -  Moving slowly or fidgety/restless 0 - 0 0 -  Suicidal thoughts 0 - 0 0 -  PHQ-9 Score 7 - 4 7 -  Difficult doing work/chores Somewhat difficult - - - -    HPI   Prior to Admission medications   Medication Sig Start Date End Date Taking? Authorizing Provider  ALPRAZolam Duanne Moron) 0.5 MG tablet Take 1 tablet (0.5 mg total) by mouth 2 (two) times daily as needed for anxiety. 09/10/17   Horald Pollen, MD  amLODipine (NORVASC) 5 MG tablet Take 1 tablet (5 mg total) by mouth daily. 10/11/19 01/09/20  Horald Pollen, MD  ASPIRIN 81 PO Take by mouth.    [provider]  L-Methylfolate-B12-B6-B2 (CEREFOLIN) 08-20-48-5 MG TABS Take 1 tablet by mouth every morning. Patient not taking: Reported on 04/10/2020 05/16/19   Garvin Fila, MD  lisinopril (ZESTRIL) 10 MG tablet TAKE 1 TABLET BY MOUTH DAILY 10/11/19   Horald Pollen, MD  Multiple Vitamin (MULTIVITAMIN) tablet Take 1 tablet by mouth daily.    [provider]  Omega-3 Fatty Acids (FISH OIL PO) Take 500 mg by mouth.    [provider]   PARoxetine (PAXIL) 20 MG tablet Take 1 tablet (20 mg total) by mouth daily. 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   HTN (hypertension) 11/21/2011    Past Medical History:  Diagnosis Date   Anxiety    Cancer ([place])    bladder  cancer- stage 1- removed 2007   Chronic kidney disease    CKD    Depression    Hypertension     Past Surgical History:  Procedure Laterality Date   BLADDER SURGERY     COLONOSCOPY     JOINT REPLACEMENT  2016   knee right    POLYPECTOMY      Social History   Socioeconomic History   Marital status: Married    Spouse name: Not on file   Number of children: Not on file   Years of education: Not on file   Highest education level: Not on file  Occupational History   Not on file  Tobacco Use  Smoking status: Former    Types: Cigarettes    Quit date: 03/22/1972    Years since quitting: 48.5   Smokeless tobacco: Never  Substance and Sexual Activity   Alcohol use: Yes    Alcohol/week: 0.0 standard drinks    Comment: rare   Drug use: No   Sexual activity: Not on file  Other Topics Concern   Not on file  Social History Narrative   Not on file   Social Determinants of Health   Financial Resource Strain: Not on file  Food Insecurity: Not on file  Transportation Needs: Not on file  Physical Activity: Not on file  Stress: Not on file  Social Connections: Not on file  Intimate Partner Violence: Not on file    Family History  Problem Relation Age of Onset   Cancer Mother    Cancer Father    Hypertension Father    Heart disease Father    Stomach cancer Father    Cancer Sister    Diabetes Sister    Heart disease Sister    Hypertension Sister    Mental illness Brother     Colon cancer Neg Hx    Colon polyps Neg Hx    Esophageal cancer Neg Hx    Rectal cancer Neg Hx      Review of Systems  Constitutional:  Positive for malaise/fatigue. Negative for chills and fever.  HENT: Negative.  Negative for congestion and sore throat.   Respiratory: Negative.  Negative for cough and shortness of breath.   Cardiovascular:  Negative for chest pain and palpitations.  Gastrointestinal:  Negative for abdominal pain, diarrhea, nausea and vomiting.  Genitourinary: Negative.  Negative for dysuria and hematuria.  Skin: Negative.  Negative for rash.  Neurological:  Positive for weakness. Negative for dizziness and headaches.  Psychiatric/Behavioral:  Positive for depression.    Today's Vitals   10/08/20 0811  BP: 122/80  Pulse: (!) 58  Resp: 18  Temp: 98.5 F (36.9 C)  TempSrc: Oral  SpO2: 96%  Weight: 204 lb (92.5 kg)  Height: 5\' 9"  (1.753 m)   Body mass index is 30.13 kg/m. Wt Readings from Last 3 Encounters:  10/08/20 204 lb (92.5 kg)  04/10/20 205 lb (93 kg)  12/11/19 202 lb (91.6 kg)    Physical Exam Vitals reviewed.  Constitutional:      Appearance: Normal appearance.  HENT:     Head: Normocephalic.     Right Ear: Tympanic membrane, ear canal and external ear normal.     Left Ear: Tympanic membrane, ear canal and external ear normal.     Mouth/Throat:     Mouth: Mucous membranes are moist.     Pharynx: Oropharynx is clear.  Eyes:     Extraocular Movements: Extraocular movements intact.     Conjunctiva/sclera: Conjunctivae normal.     Pupils: Pupils are equal, round, and reactive to light.  Cardiovascular:     Rate and Rhythm: Normal rate and regular rhythm.     Pulses: Normal pulses.     Heart sounds: Normal heart sounds.  Pulmonary:     Effort: Pulmonary effort is normal.     Breath sounds: Normal breath sounds.  Musculoskeletal:        General: Normal range of motion.     Cervical back: Normal range of motion and neck supple.  Skin:     General: Skin is warm and dry.     Capillary Refill: Capillary refill takes less than 2 seconds.  Neurological:     General: No focal deficit present.     Mental Status: He is alert and oriented to person, place, and time.  Psychiatric:        Mood and Affect: Mood normal.        Behavior: Behavior normal.     ASSESSMENT & PLAN: A total of 30 minutes was spent with the patient and counseling/coordination of care regarding preparing for this visit, review of most recent office visit notes, review of most recent blood work results, hypertension and chronic kidney disease and cardiovascular risk associated with these conditions, diagnosis of active depression and need to restart medication Paxil 10 mg daily and need for psychiatric evaluation, health maintenance items, prognosis, documentation and need for follow-up in 3 months.  Moderate episode of recurrent major depressive disorder (HCC) History of chronic depression, currently active.  Has been on Paxil before with good results.  We will restart Paxil at reduced doses due to chronic kidney disease.  Needs referral to psychiatry services.  HTN (hypertension) Well-controlled hypertension.  Continue lisinopril 10 mg daily. Dietary approach to stop hypertension discussed.  Stage 4 chronic kidney disease (HCC) Stable per patient.  Sees nephrologist on a regular basis.  No need for dialysis yet.  Aldyn was seen today for 6 month f/u.  Diagnoses and all orders for this visit:  Moderate episode of recurrent major depressive disorder (HCC) -     PARoxetine (PAXIL) 10 MG tablet; Take 1 tablet (10 mg total) by mouth daily. -     Ambulatory referral to Psychiatry  Stage 4 chronic kidney disease ([place]) -     Comprehensive metabolic panel  Primary hypertension -     CBC with Differential/Platelet -     Comprehensive metabolic panel -     Hemoglobin A1c -     Lipid panel  Prostate cancer screening -     PSA  Patient Instructions  Major  Depressive Disorder, Adult Major depressive disorder is a mental health condition. This disorder affects feelings. It can also affect the body. Symptoms of this condition last most of the day, almost every day, for 2 weeks. This disorder can affect: Relationships. Daily activities, such as work and school. Activities that you normally like to do. What are the causes? The cause of this condition is not known. The disorder is likely caused by a mix of things, including: Your personality, such as being a shy person. Your behavior, or how you act toward others. Your thoughts and feelings. Too much alcohol or drugs. How you react to stress. Health and mental problems that you have had for a long time. Things that hurt you in the past (trauma). Big changes in your life, such as divorce. What increases the risk? The following factors may make you more likely to develop this condition: Having family members with depression. Being a woman. Problems in the family. Low levels of some brain chemicals. Things that caused you pain as a child, especially if you lost a parent or were abused. A lot of stress in your life, such as from: Living without basic needs of life, such as food and shelter. Being treated poorly because of race, sex, or religion (discrimination). Health and mental problems that you have had for a long time. What are the signs or symptoms? The main symptoms of this condition are: Being sad all the time. Being grouchy all the time. Loss of interest in things and activities. Other symptoms include: Sleeping too much or  too little. Eating too much or too little. Gaining or losing weight, without knowing why. Feeling tired or having low energy. Being restless and weak. Feeling hopeless, worthless, or guilty. Trouble thinking clearly or making decisions. Thoughts of hurting yourself or others, or thoughts of ending your life. Spending a lot of time alone. Inability to complete  common tasks of daily life. If you have very bad MDD, you may: Believe things that are not true. Hear, see, taste, or feel things that are not there. Have mild depression that lasts for at least 2 years. Feel very sad and hopeless. Have trouble speaking or moving. How is this treated? This condition may be treated with: Talk therapy. This teaches you to know bad thoughts, feelings, and actions and how to change them. This can also help you to communicate with others. This can be done with members of your family. Medicines. These can be used to treat worry (anxiety), depression, or low levels of chemicals in the brain. Lifestyle changes. You may need to: Limit alcohol use. Limit drug use. Get regular exercise. Get plenty of sleep. Make healthy eating choices. Spend more time outdoors. Brain stimulation. This treatment excites the brain. This is done when symptoms are very bad or have not gotten better with other treatments. Follow these instructions at home: Activity Get regular exercise as told. Spend time outdoors as told. Make time to do the things you enjoy. Find ways to deal with stress. Try to: Meditate. Do deep breathing. Spend time in nature. Keep a journal. Return to your normal activities as told by your doctor. Ask your doctor what activities are safe for you. Alcohol and drug use If you drink alcohol: Limit how much you use to: 0-1 drink a day for women. 0-2 drinks a day for men. Be aware of how much alcohol is in your drink. In the [place] one drink equals one 12 oz bottle of beer (355 mL), one 5 oz glass of wine (148 mL), or one 1 oz glass of hard liquor (44 mL). Talk to your doctor about: Alcohol use. Alcohol can affect some medicines. Any drug use. General instructions  Take over-the-counter and prescription medicines and herbal preparations only as told by your doctor. Eat a healthy diet. Get a lot of sleep. Think about joining a support group. Your doctor  may be able to suggest one. Keep all follow-up visits as told by your doctor. This is important.  Where to find more information: [employer] on Mental Illness: www.nami.[place]: https://carter.com/ American Psychiatric Association: www.psychiatry.org/patients-families/ Contact a doctor if: Your symptoms get worse. You get new symptoms. Get help right away if: You hurt yourself. You have serious thoughts about hurting yourself or others. You see, hear, taste, smell, or feel things that are not there. If you ever feel like you may hurt yourself or others, or have thoughts about taking your own life, get help right away. Go to your nearest emergency department or: Call your local emergency services (911 in the [place] Call a suicide crisis helpline, such as the [place] at (209)741-8701. This is open 24 hours a day in the [place] Text the Crisis Text Line at 364-552-3044 (in the [place].). Summary Major depressive disorder is a mental health condition. This disorder affects feelings. Symptoms of this condition last most of the day, almost every day, for 2 weeks. The symptoms of this disorder can cause problems with relationships and with daily activities. There are treatments  and support for people who get this disorder. You may need more than one type of treatment. Get help right away if you have serious thoughts about hurting yourself or others. This information is not intended to replace advice given to you by your health care provider. Make sure you discuss any questions you have with your healthcare provider. Document Revised: 02/17/2019 Document Reviewed: 02/17/2019 Elsevier Patient Education  2022 [place], MD [facility]

## 2020-10-08 NOTE — Patient Instructions (Signed)
Major Depressive Disorder, Adult Major depressive disorder is a mental health condition. This disorder affects feelings. It can also affect the body. Symptoms of this condition last most of the day, almost every day, for 2 weeks. This disorder can affect: Relationships. Daily activities, such as work and school. Activities that you normally like to do. What are the causes? The cause of this condition is not known. The disorder is likely caused by a mix of things, including: Your personality, such as being a shy person. Your behavior, or how you act toward others. Your thoughts and feelings. Too much alcohol or drugs. How you react to stress. Health and mental problems that you have had for a long time. Things that hurt you in the past (trauma). Big changes in your life, such as divorce. What increases the risk? The following factors may make you more likely to develop this condition: Having family members with depression. Being a woman. Problems in the family. Low levels of some brain chemicals. Things that caused you pain as a child, especially if you lost a parent or were abused. A lot of stress in your life, such as from: Living without basic needs of life, such as food and shelter. Being treated poorly because of race, sex, or religion (discrimination). Health and mental problems that you have had for a long time. What are the signs or symptoms? The main symptoms of this condition are: Being sad all the time. Being grouchy all the time. Loss of interest in things and activities. Other symptoms include: Sleeping too much or too little. Eating too much or too little. Gaining or losing weight, without knowing why. Feeling tired or having low energy. Being restless and weak. Feeling hopeless, worthless, or guilty. Trouble thinking clearly or making decisions. Thoughts of hurting yourself or others, or thoughts of ending your life. Spending a lot of time alone. Inability to  complete common tasks of daily life. If you have very bad MDD, you may: Believe things that are not true. Hear, see, taste, or feel things that are not there. Have mild depression that lasts for at least 2 years. Feel very sad and hopeless. Have trouble speaking or moving. How is this treated? This condition may be treated with: Talk therapy. This teaches you to know bad thoughts, feelings, and actions and how to change them. This can also help you to communicate with others. This can be done with members of your family. Medicines. These can be used to treat worry (anxiety), depression, or low levels of chemicals in the brain. Lifestyle changes. You may need to: Limit alcohol use. Limit drug use. Get regular exercise. Get plenty of sleep. Make healthy eating choices. Spend more time outdoors. Brain stimulation. This treatment excites the brain. This is done when symptoms are very bad or have not gotten better with other treatments. Follow these instructions at home: Activity Get regular exercise as told. Spend time outdoors as told. Make time to do the things you enjoy. Find ways to deal with stress. Try to: Meditate. Do deep breathing. Spend time in nature. Keep a journal. Return to your normal activities as told by your doctor. Ask your doctor what activities are safe for you. Alcohol and drug use If you drink alcohol: Limit how much you use to: 0-1 drink a day for women. 0-2 drinks a day for men. Be aware of how much alcohol is in your drink. In the [place] one drink equals one 12 oz bottle of beer (355 mL),   one 5 oz glass of wine (148 mL), or one 1 oz glass of hard liquor (44 mL). Talk to your doctor about: Alcohol use. Alcohol can affect some medicines. Any drug use. General instructions  Take over-the-counter and prescription medicines and herbal preparations only as told by your doctor. Eat a healthy diet. Get a lot of sleep. Think about joining a support group.  Your doctor may be able to suggest one. Keep all follow-up visits as told by your doctor. This is important.  Where to find more information: National Alliance on Mental Illness: www.nami.org [place] National Institute of Mental Health: www.nimh.nih.gov American Psychiatric Association: www.psychiatry.org/patients-families/ Contact a doctor if: Your symptoms get worse. You get new symptoms. Get help right away if: You hurt yourself. You have serious thoughts about hurting yourself or others. You see, hear, taste, smell, or feel things that are not there. If you ever feel like you may hurt yourself or others, or have thoughts about taking your own life, get help right away. Go to your nearest emergency department or: Call your local emergency services (911 in the [place] Call a suicide crisis helpline, such as the National Suicide Prevention Lifeline at 1-800-273-8255. This is open 24 hours a day in the [place] Text the Crisis Text Line at 741741 (in the [place] Summary Major depressive disorder is a mental health condition. This disorder affects feelings. Symptoms of this condition last most of the day, almost every day, for 2 weeks. The symptoms of this disorder can cause problems with relationships and with daily activities. There are treatments and support for people who get this disorder. You may need more than one type of treatment. Get help right away if you have serious thoughts about hurting yourself or others. This information is not intended to replace advice given to you by your health care provider. Make sure you discuss any questions you have with your healthcare provider. Document Revised: 02/17/2019 Document Reviewed: 02/17/2019 Elsevier Patient Education  2022 [employer] Inc[employer]  

## 2020-10-08 NOTE — Assessment & Plan Note (Signed)
History of chronic depression, currently active.  Has been on Paxil before with good results.  We will restart Paxil at reduced doses due to chronic kidney disease.  Needs referral to psychiatry services.

## 2020-10-09 LAB — PSA: PSA: 3.58 ng/mL (ref 0.10–4.00)

## 2020-11-14 ENCOUNTER — Other Ambulatory Visit: Payer: Self-pay | Admitting: Emergency Medicine

## 2020-11-14 DIAGNOSIS — I10 Essential (primary) hypertension: Secondary | ICD-10-CM

## 2020-11-14 DIAGNOSIS — F325 Major depressive disorder, single episode, in full remission: Secondary | ICD-10-CM

## 2020-11-26 DIAGNOSIS — Z7722 Contact with and (suspected) exposure to environmental tobacco smoke (acute) (chronic): Secondary | ICD-10-CM | POA: Diagnosis not present

## 2020-11-26 DIAGNOSIS — Z7982 Long term (current) use of aspirin: Secondary | ICD-10-CM | POA: Diagnosis not present

## 2020-11-26 DIAGNOSIS — Z809 Family history of malignant neoplasm, unspecified: Secondary | ICD-10-CM | POA: Diagnosis not present

## 2020-11-26 DIAGNOSIS — E669 Obesity, unspecified: Secondary | ICD-10-CM | POA: Diagnosis not present

## 2020-11-26 DIAGNOSIS — F419 Anxiety disorder, unspecified: Secondary | ICD-10-CM | POA: Diagnosis not present

## 2020-11-26 DIAGNOSIS — I10 Essential (primary) hypertension: Secondary | ICD-10-CM | POA: Diagnosis not present

## 2020-11-26 DIAGNOSIS — Z6830 Body mass index (BMI) 30.0-30.9, adult: Secondary | ICD-10-CM | POA: Diagnosis not present

## 2020-11-26 DIAGNOSIS — F325 Major depressive disorder, single episode, in full remission: Secondary | ICD-10-CM | POA: Diagnosis not present

## 2020-11-26 DIAGNOSIS — R32 Unspecified urinary incontinence: Secondary | ICD-10-CM | POA: Diagnosis not present

## 2020-12-05 ENCOUNTER — Ambulatory Visit (INDEPENDENT_AMBULATORY_CARE_PROVIDER_SITE_OTHER): Payer: Medicare PPO

## 2020-12-05 DIAGNOSIS — Z0000 Encounter for general adult medical examination without abnormal findings: Secondary | ICD-10-CM

## 2020-12-05 NOTE — Patient Instructions (Addendum)
Health Maintenance, Male Adopting a healthy lifestyle and getting preventive care are important in promoting health and wellness. Ask your health care provider about: The right schedule for you to have regular tests and exams. Things you can do on your own to prevent diseases and keep yourself healthy. What should I know about diet, weight, and exercise? Eat a healthy diet  Eat a diet that includes plenty of vegetables, fruits, low-fat dairy products, and lean protein. Do not eat a lot of foods that are high in solid fats, added sugars, or sodium. Maintain a healthy weight Body mass index (BMI) is a measurement that can be used to identify possible weight problems. It estimates body fat based on height and weight. Your health care provider can help determine your BMI and help you achieve or maintain a healthy weight. Get regular exercise Get regular exercise. This is one of the most important things you can do for your health. Most adults should: Exercise for at least 150 minutes each week. The exercise should increase your heart rate and make you sweat (moderate-intensity exercise). Do strengthening exercises at least twice a week. This is in addition to the moderate-intensity exercise. Spend less time sitting. Even light physical activity can be beneficial. Watch cholesterol and blood lipids Have your blood tested for lipids and cholesterol at 70 years of age, then have this test every 5 years. You may need to have your cholesterol levels checked more often if: Your lipid or cholesterol levels are high. You are older than 70 years of age. You are at high risk for heart disease. What should I know about cancer screening? Many types of cancers can be detected early and may often be prevented. Depending on your health history and family history, you may need to have cancer screening at various ages. This may include screening for: Colorectal cancer. Prostate cancer. Skin cancer. Lung  cancer. What should I know about heart disease, diabetes, and high blood pressure? Blood pressure and heart disease High blood pressure causes heart disease and increases the risk of stroke. This is more likely to develop in people who have high blood pressure readings, are of African descent, or are overweight. Talk with your health care provider about your target blood pressure readings. Have your blood pressure checked: Every 3-5 years if you are 18-39 years of age. Every year if you are 40 years old or older. If you are between the ages of 65 and 75 and are a current or former smoker, ask your health care provider if you should have a one-time screening for abdominal aortic aneurysm (AAA). Diabetes Have regular diabetes screenings. This checks your fasting blood sugar level. Have the screening done: Once every three years after age 45 if you are at a normal weight and have a low risk for diabetes. More often and at a younger age if you are overweight or have a high risk for diabetes. What should I know about preventing infection? Hepatitis B If you have a higher risk for hepatitis B, you should be screened for this virus. Talk with your health care provider to find out if you are at risk for hepatitis B infection. Hepatitis C Blood testing is recommended for: Everyone born from 1945 through 1965. Anyone with known risk factors for hepatitis C. Sexually transmitted infections (STIs) You should be screened each year for STIs, including gonorrhea and chlamydia, if: You are sexually active and are younger than 70 years of age. You are older than 70 years   of age and your health care provider tells you that you are at risk for this type of infection. Your sexual activity has changed since you were last screened, and you are at increased risk for chlamydia or gonorrhea. Ask your health care provider if you are at risk. Ask your health care provider about whether you are at high risk for HIV.  Your health care provider may recommend a prescription medicine to help prevent HIV infection. If you choose to take medicine to prevent HIV, you should first get tested for HIV. You should then be tested every 3 months for as long as you are taking the medicine. Follow these instructions at home: Lifestyle Do not use any products that contain nicotine or tobacco, such as cigarettes, e-cigarettes, and chewing tobacco. If you need help quitting, ask your health care provider. Do not use street drugs. Do not share needles. Ask your health care provider for help if you need support or information about quitting drugs. Alcohol use Do not drink alcohol if your health care provider tells you not to drink. If you drink alcohol: Limit how much you have to 0-2 drinks a day. Be aware of how much alcohol is in your drink. In the [place] one drink equals one 12 oz bottle of beer (355 mL), one 5 oz glass of wine (148 mL), or one 1 oz glass of hard liquor (44 mL). General instructions Schedule regular health, dental, and eye exams. Stay current with your vaccines. Tell your health care provider if: You often feel depressed. You have ever been abused or do not feel safe at home. Summary Adopting a healthy lifestyle and getting preventive care are important in promoting health and wellness. Follow your health care provider's instructions about healthy diet, exercising, and getting tested or screened for diseases. Follow your health care provider's instructions on monitoring your cholesterol and blood pressure. This information is not intended to replace advice given to you by your health care provider. Make sure you discuss any questions you have with your health care provider. Document Revised: 05/16/2020 Document Reviewed: 03/01/2018 Elsevier Patient Education  2022 [place] Maintenance, Male Adopting a healthy lifestyle and getting preventive care are important in promoting health and  wellness. Ask your health care provider about: The right schedule for you to have regular tests and exams. Things you can do on your own to prevent diseases and keep yourself healthy. What should I know about diet, weight, and exercise? Eat a healthy diet  Eat a diet that includes plenty of vegetables, fruits, low-fat dairy products, and lean protein. Do not eat a lot of foods that are high in solid fats, added sugars, or sodium. Maintain a healthy weight Body mass index (BMI) is a measurement that can be used to identify possible weight problems. It estimates body fat based on height and weight. Your health care provider can help determine your BMI and help you achieve or maintain a healthy weight. Get regular exercise Get regular exercise. This is one of the most important things you can do for your health. Most adults should: Exercise for at least 150 minutes each week. The exercise should increase your heart rate and make you sweat (moderate-intensity exercise). Do strengthening exercises at least twice a week. This is in addition to the moderate-intensity exercise. Spend less time sitting. Even light physical activity can be beneficial. Watch cholesterol and blood lipids Have your blood tested for lipids and cholesterol at 70 years of age, then have  this test every 5 years. You may need to have your cholesterol levels checked more often if: Your lipid or cholesterol levels are high. You are older than 70 years of age. You are at high risk for heart disease. What should I know about cancer screening? Many types of cancers can be detected early and may often be prevented. Depending on your health history and family history, you may need to have cancer screening at various ages. This may include screening for: Colorectal cancer. Prostate cancer. Skin cancer. Lung cancer. What should I know about heart disease, diabetes, and high blood pressure? Blood pressure and heart disease High  blood pressure causes heart disease and increases the risk of stroke. This is more likely to develop in people who have high blood pressure readings, are of African descent, or are overweight. Talk with your health care provider about your target blood pressure readings. Have your blood pressure checked: Every 3-5 years if you are 78-59 years of age. Every year if you are 39 years old or older. If you are between the ages of 13 and 23 and are a current or former smoker, ask your health care provider if you should have a one-time screening for abdominal aortic aneurysm (AAA). Diabetes Have regular diabetes screenings. This checks your fasting blood sugar level. Have the screening done: Once every three years after age 64 if you are at a normal weight and have a low risk for diabetes. More often and at a younger age if you are overweight or have a high risk for diabetes. What should I know about preventing infection? Hepatitis B If you have a higher risk for hepatitis B, you should be screened for this virus. Talk with your health care provider to find out if you are at risk for hepatitis B infection. Hepatitis C Blood testing is recommended for: Everyone born from [birth year] through 1965. Anyone with known risk factors for hepatitis C. Sexually transmitted infections (STIs) You should be screened each year for STIs, including gonorrhea and chlamydia, if: You are sexually active and are younger than 70 years of age. You are older than 70 years of age and your health care provider tells you that you are at risk for this type of infection. Your sexual activity has changed since you were last screened, and you are at increased risk for chlamydia or gonorrhea. Ask your health care provider if you are at risk. Ask your health care provider about whether you are at high risk for HIV. Your health care provider may recommend a prescription medicine to help prevent HIV infection. If you choose to take medicine  to prevent HIV, you should first get tested for HIV. You should then be tested every 3 months for as long as you are taking the medicine. Follow these instructions at home: Lifestyle Do not use any products that contain nicotine or tobacco, such as cigarettes, e-cigarettes, and chewing tobacco. If you need help quitting, ask your health care provider. Do not use street drugs. Do not share needles. Ask your health care provider for help if you need support or information about quitting drugs. Alcohol use Do not drink alcohol if your health care provider tells you not to drink. If you drink alcohol: Limit how much you have to 0-2 drinks a day. Be aware of how much alcohol is in your drink. In the [place] one drink equals one 12 oz bottle of beer (355 mL), one 5 oz glass of wine (148 mL), or one  1 oz glass of hard liquor (44 mL). General instructions Schedule regular health, dental, and eye exams. Stay current with your vaccines. Tell your health care provider if: You often feel depressed. You have ever been abused or do not feel safe at home. Summary Adopting a healthy lifestyle and getting preventive care are important in promoting health and wellness. Follow your health care provider's instructions about healthy diet, exercising, and getting tested or screened for diseases. Follow your health care provider's instructions on monitoring your cholesterol and blood pressure. This information is not intended to replace advice given to you by your health care provider. Make sure you discuss any questions you have with your health care provider. Document Revised: 05/16/2020 Document Reviewed: 03/01/2018 Elsevier Patient Education  2022 [place] Maintenance, Male Adopting a healthy lifestyle and getting preventive care are important in promoting health and wellness. Ask your health care provider about: The right schedule for you to have regular tests and exams. Things you can do on  your own to prevent diseases and keep yourself healthy. What should I know about diet, weight, and exercise? Eat a healthy diet  Eat a diet that includes plenty of vegetables, fruits, low-fat dairy products, and lean protein. Do not eat a lot of foods that are high in solid fats, added sugars, or sodium. Maintain a healthy weight Body mass index (BMI) is a measurement that can be used to identify possible weight problems. It estimates body fat based on height and weight. Your health care provider can help determine your BMI and help you achieve or maintain a healthy weight. Get regular exercise Get regular exercise. This is one of the most important things you can do for your health. Most adults should: Exercise for at least 150 minutes each week. The exercise should increase your heart rate and make you sweat (moderate-intensity exercise). Do strengthening exercises at least twice a week. This is in addition to the moderate-intensity exercise. Spend less time sitting. Even light physical activity can be beneficial. Watch cholesterol and blood lipids Have your blood tested for lipids and cholesterol at 70 years of age, then have this test every 5 years. You may need to have your cholesterol levels checked more often if: Your lipid or cholesterol levels are high. You are older than 70 years of age. You are at high risk for heart disease. What should I know about cancer screening? Many types of cancers can be detected early and may often be prevented. Depending on your health history and family history, you may need to have cancer screening at various ages. This may include screening for: Colorectal cancer. Prostate cancer. Skin cancer. Lung cancer. What should I know about heart disease, diabetes, and high blood pressure? Blood pressure and heart disease High blood pressure causes heart disease and increases the risk of stroke. This is more likely to develop in people who have high blood  pressure readings, are of African descent, or are overweight. Talk with your health care provider about your target blood pressure readings. Have your blood pressure checked: Every 3-5 years if you are 42-67 years of age. Every year if you are 70 years old or older. If you are between the ages of 35 and 35 and are a current or former smoker, ask your health care provider if you should have a one-time screening for abdominal aortic aneurysm (AAA). Diabetes Have regular diabetes screenings. This checks your fasting blood sugar level. Have the screening done: Once every three years  after age 14 if you are at a normal weight and have a low risk for diabetes. More often and at a younger age if you are overweight or have a high risk for diabetes. What should I know about preventing infection? Hepatitis B If you have a higher risk for hepatitis B, you should be screened for this virus. Talk with your health care provider to find out if you are at risk for hepatitis B infection. Hepatitis C Blood testing is recommended for: Everyone born from [birth year] through 1965. Anyone with known risk factors for hepatitis C. Sexually transmitted infections (STIs) You should be screened each year for STIs, including gonorrhea and chlamydia, if: You are sexually active and are younger than 70 years of age. You are older than 70 years of age and your health care provider tells you that you are at risk for this type of infection. Your sexual activity has changed since you were last screened, and you are at increased risk for chlamydia or gonorrhea. Ask your health care provider if you are at risk. Ask your health care provider about whether you are at high risk for HIV. Your health care provider may recommend a prescription medicine to help prevent HIV infection. If you choose to take medicine to prevent HIV, you should first get tested for HIV. You should then be tested every 3 months for as long as you are taking the  medicine. Follow these instructions at home: Lifestyle Do not use any products that contain nicotine or tobacco, such as cigarettes, e-cigarettes, and chewing tobacco. If you need help quitting, ask your health care provider. Do not use street drugs. Do not share needles. Ask your health care provider for help if you need support or information about quitting drugs. Alcohol use Do not drink alcohol if your health care provider tells you not to drink. If you drink alcohol: Limit how much you have to 0-2 drinks a day. Be aware of how much alcohol is in your drink. In the [place] one drink equals one 12 oz bottle of beer (355 mL), one 5 oz glass of wine (148 mL), or one 1 oz glass of hard liquor (44 mL). General instructions Schedule regular health, dental, and eye exams. Stay current with your vaccines. Tell your health care provider if: You often feel depressed. You have ever been abused or do not feel safe at home. Summary Adopting a healthy lifestyle and getting preventive care are important in promoting health and wellness. Follow your health care provider's instructions about healthy diet, exercising, and getting tested or screened for diseases. Follow your health care provider's instructions on monitoring your cholesterol and blood pressure. This information is not intended to replace advice given to you by your health care provider. Make sure you discuss any questions you have with your health care provider. Document Revised: 05/16/2020 Document Reviewed: 03/01/2018 Elsevier Patient Education  2022 [employer].

## 2020-12-05 NOTE — Progress Notes (Signed)
Subjective:   Christopher [name] is a 70 y.o. male who presents for an Initial Medicare Annual Wellness Visit.  Review of Systems    I connected with  [name] on 12/09/20 by an audio only telemedicine application and verified that I am speaking with the correct Christopher [name] using two identifiers.   I discussed the limitations, risks, security and privacy concerns of performing an evaluation and management service by telephone and the availability of in [name] appointments. I also discussed with the patient that there may be a patient responsible charge related to this service. The patient expressed understanding and verbally consented to this telephonic visit.  Location of Patient: Home Location of Provider: Office  List any persons and their role that are participating in the visit with the patient. Darlen Round and Randa Evens Porschia Willbanks, CMA        Objective:    There were no vitals filed for this visit. There is no height or weight on file to calculate BMI.  Advanced Directives 01/15/2019 08/01/2014  Does Patient Have a Medical Advance Directive? Yes Yes  Does patient want to make changes to medical advance directive? No - Patient declined -  Copy of [place] in Chart? No - copy requested -    Current Medications (verified) Outpatient Encounter Medications as of 12/05/2020  Medication Sig   ALPRAZolam (XANAX) 0.5 MG tablet Take 1 tablet (0.5 mg total) by mouth 2 (two) times daily as needed for anxiety.   amLODipine (NORVASC) 5 MG tablet TAKE 1 TABLET(5 MG) BY MOUTH DAILY   ASPIRIN 81 PO Take by mouth.   lisinopril (ZESTRIL) 10 MG tablet TAKE 1 TABLET BY MOUTH DAILY   Multiple Vitamin (MULTIVITAMIN) tablet Take 1 tablet by mouth daily.   Omega-3 Fatty Acids (FISH OIL PO) Take 500 mg by mouth.   PARoxetine (PAXIL) 10 MG tablet Take 1 tablet (10 mg total) by mouth daily.   [DISCONTINUED] L-Methylfolate-B12-B6-B2 (CEREFOLIN) 08-20-48-5 MG TABS Take 1 tablet by  mouth every morning. (Patient not taking: No sig reported)   Facility-Administered Encounter Medications as of 12/05/2020  Medication   0.9 %  sodium chloride infusion    Allergies (verified) Patient has no known allergies.   History: Past Medical History:  Diagnosis Date   Anxiety    Cancer ([place])    bladder  cancer- stage 1- removed 2007   Chronic kidney disease    CKD    Depression    Hypertension    Past Surgical History:  Procedure Laterality Date   BLADDER SURGERY     COLONOSCOPY     JOINT REPLACEMENT  2016   knee right    POLYPECTOMY     Family History  Problem Relation Age of Onset   Cancer Mother    Cancer Father    Hypertension Father    Heart disease Father    Stomach cancer Father    Cancer Sister    Diabetes Sister    Heart disease Sister    Hypertension Sister    Mental illness Brother    Colon cancer Neg Hx    Colon polyps Neg Hx    Esophageal cancer Neg Hx    Rectal cancer Neg Hx    Social History   Socioeconomic History   Marital status: Married    Spouse name: Not on file   Number of children: Not on file   Years of education: Not on file   Highest education level: Not on file  Occupational History   Not on file  Tobacco Use   Smoking status: Former    Types: Cigarettes    Quit date: 03/22/1972    Years since quitting: 48.7   Smokeless tobacco: Never  Substance and Sexual Activity   Alcohol use: Yes    Alcohol/week: 0.0 standard drinks    Comment: rare   Drug use: No   Sexual activity: Not on file  Other Topics Concern   Not on file  Social History Narrative   Not on file   Social Determinants of Health   Financial Resource Strain: Not on file  Food Insecurity: Not on file  Transportation Needs: Not on file  Physical Activity: Not on file  Stress: Not on file  Social Connections: Not on file    Tobacco Counseling Counseling given: Not Answered   Clinical Intake:  Pre-visit preparation completed: Yes  Pain :  No/denies pain     Nutritional Risks: None Diabetes: No     Diabetic?NO  Interpreter Needed?: No      Activities of Daily Living In your present state of health, do you have any difficulty performing the following activities: 12/05/2020 10/08/2020  Hearing? N N  Vision? N N  Difficulty concentrating or making decisions? N N  Walking or climbing stairs? N N  Dressing or bathing? N N  Doing errands, shopping? N N  Some recent data might be hidden    Patient Care Team: Horald Pollen, MD as PCP - General (Internal Medicine)  Indicate any recent Medical Services you may have received from other than [facility] providers in the past year (date may be approximate).     Assessment:   This is a routine wellness examination for Tr[place].  Hearing/Vision screen No results found.  Dietary issues and exercise activities discussed:     Goals Addressed   None   Depression Screen PHQ 2/9 Scores 12/05/2020 10/08/2020 04/10/2020 04/16/2019 04/16/2019 01/15/2019 10/11/2018  PHQ - 2 Score 0 2 0 0 3 0 0  PHQ- 9 Score 0 7 - 4 7 - 0    Fall Risk Fall Risk  12/05/2020 04/10/2020 10/11/2019 04/16/2019 01/15/2019  Falls in the past year? 0 0 0 0 0  Number falls in past yr: 0 - 0 0 0  Injury with Fall? 0 - 0 0 0  Risk for fall due to : No Fall Risks - - - -  Follow up Falls evaluation completed Falls evaluation completed Falls evaluation completed Falls evaluation completed Falls evaluation completed;Education provided;Falls prevention discussed    FALL RISK PREVENTION PERTAINING TO THE HOME:  Any stairs in or around the home? Yes  If so, are there any without handrails? No  Home free of loose throw rugs in walkways, pet beds, electrical cords, etc? Yes  Adequate lighting in your home to reduce risk of falls? Yes   ASSISTIVE DEVICES UTILIZED TO PREVENT FALLS:  Life alert? No  Use of a cane, walker or w/c? No  Grab bars in the bathroom? No  Shower chair or bench in shower? No  Elevated  toilet seat or a handicapped toilet? No   TIMED UP AND GO:  Was the test performed? No .  Length of time to ambulate 10 feet: n/a sec.     Cognitive Function: MMSE - Mini Mental State Exam 05/16/2019  Orientation to time 5  Orientation to Place 5  Registration 3  Attention/ Calculation 0  Recall 3  Language- name 2 objects 2  Language-  repeat 1  Language- follow 3 step command 3  Language- read & follow direction 1  Write a sentence 1  Copy design 1  Total score 25     6CIT Screen 12/05/2020 04/16/2019 01/15/2019  What Year? 0 points 0 points 0 points  What month? 0 points 0 points 0 points  What time? 0 points 0 points 0 points  Count back from 20 0 points 0 points 0 points  Months in reverse 4 points 0 points 0 points  Repeat phrase 0 points 0 points 6 points  Total Score 4 0 6    Immunizations Immunization History  Administered Date(s) Administered   Fluad Quad(high Dose 65+) 12/20/2019   Influenza Inj Mdck Quad With Preservative 01/13/2018   Influenza, High Dose Seasonal PF 12/27/2018   Moderna Sars-Covid-2 Vaccination 06/09/2019, 07/07/2019   Pneumococcal Conjugate-13 10/11/2018    TDAP status: Up to date  Flu Vaccine status: Due, Education has been provided regarding the importance of this vaccine. Advised may receive this vaccine at local pharmacy or Health Dept. Aware to provide a copy of the vaccination record if obtained from local pharmacy or Health Dept. Verbalized acceptance and understanding.  Pneumococcal vaccine status: Due, Education has been provided regarding the importance of this vaccine. Advised may receive this vaccine at local pharmacy or Health Dept. Aware to provide a copy of the vaccination record if obtained from local pharmacy or Health Dept. Verbalized acceptance and understanding.  Covid-19 vaccine status: Information provided on how to obtain vaccines.   Qualifies for Shingles Vaccine? Yes   Zostavax completed No   Shingrix  Completed?: No.    Education has been provided regarding the importance of this vaccine. Patient has been advised to call insurance company to determine out of pocket expense if they have not yet received this vaccine. Advised may also receive vaccine at local pharmacy or Health Dept. Verbalized acceptance and understanding.  Screening Tests Health Maintenance  Topic Date Due   Hepatitis C Screening  Never done   Zoster Vaccines- Shingrix (1 of 2) Never done   COVID-19 Vaccine (3 - Moderna risk series) 08/04/2019   INFLUENZA VACCINE  10/20/2020   TETANUS/TDAP  04/10/2021 (Originally 08/20/2016)   COLONOSCOPY (Pts 45-83yrs Insurance coverage will need to be confirmed)  12/10/2024   HPV VACCINES  Aged Out    Health Maintenance  Health Maintenance Due  Topic Date Due   Hepatitis C Screening  Never done   Zoster Vaccines- Shingrix (1 of 2) Never done   COVID-19 Vaccine (3 - Moderna risk series) 08/04/2019   INFLUENZA VACCINE  10/20/2020    Colorectal cancer screening: Type of screening: Colonoscopy. Completed 12/11/2019. Repeat every 5 years  Lung Cancer Screening: (Low Dose CT Chest recommended if Age 88-80 years, 30 pack-year currently smoking OR have quit w/in 15years.) does not qualify.   Lung Cancer Screening Referral: n/a  Additional Screening:  Hepatitis C Screening: does qualify; Not completed  Vision Screening: Recommended annual ophthalmology exams for early detection of glaucoma and other disorders of the eye. Is the patient up to date with their annual eye exam?  Yes  Who is the provider or what is the name of the office in which the patient attends annual eye exams? Unable to remember If pt is not established with a provider, would they like to be referred to a provider to establish care? No .   Dental Screening: Recommended annual dental exams for proper oral hygiene  Community Resource Referral / Chronic  Care Management: CRR required this visit?  No   CCM required  this visit?  No      Plan:     I have personally reviewed and noted the following in the patient's chart:   Medical and social history Use of alcohol, tobacco or illicit drugs  Current medications and supplements including opioid prescriptions. Patient is not currently taking opioid prescriptions. Functional ability and status Nutritional status Physical activity Advanced directives List of other physicians Hospitalizations, surgeries, and ER visits in previous 12 months Vitals Screenings to include cognitive, depression, and falls Referrals and appointments  In addition, I have reviewed and discussed with patient certain preventive protocols, quality metrics, and best practice recommendations. A written personalized care plan for preventive services as well as general preventive health recommendations were provided to patient.     Ival Bible Glenn Gullickson, CMA   12/09/2020   Nurse Notes: Non Face to Face 40 min visit   Mr. Ramakrishnan , Thank you for taking time to come for your Medicare Wellness Visit. I appreciate your ongoing commitment to your health goals. Please review the following plan we discussed and let me know if I can assist you in the future.   These are the goals we discussed:  Goals       Patient Stated (pt-stated)      More exercise         This is a list of the screening recommended for you and due dates:  Health Maintenance  Topic Date Due   Hepatitis C Screening: USPSTF Recommendation to screen - Ages 29-79 yo.  Never done   Zoster (Shingles) Vaccine (1 of 2) Never done   COVID-19 Vaccine (3 - Moderna risk series) 08/04/2019   Flu Shot  10/20/2020   Tetanus Vaccine  04/10/2021*   Colon Cancer Screening  12/10/2024   HPV Vaccine  Aged Out  *Topic was postponed. The date shown is not the original due date.

## 2020-12-07 ENCOUNTER — Ambulatory Visit: Payer: Medicare PPO

## 2021-01-06 DIAGNOSIS — N281 Cyst of kidney, acquired: Secondary | ICD-10-CM | POA: Diagnosis not present

## 2021-01-06 DIAGNOSIS — I129 Hypertensive chronic kidney disease with stage 1 through stage 4 chronic kidney disease, or unspecified chronic kidney disease: Secondary | ICD-10-CM | POA: Diagnosis not present

## 2021-01-06 DIAGNOSIS — N184 Chronic kidney disease, stage 4 (severe): Secondary | ICD-10-CM | POA: Diagnosis not present

## 2021-01-06 DIAGNOSIS — Z23 Encounter for immunization: Secondary | ICD-10-CM | POA: Diagnosis not present

## 2021-01-08 ENCOUNTER — Ambulatory Visit: Payer: Medicare PPO | Admitting: Emergency Medicine

## 2021-01-08 ENCOUNTER — Telehealth: Payer: Medicare PPO | Admitting: Emergency Medicine

## 2021-01-08 ENCOUNTER — Telehealth: Payer: Self-pay | Admitting: Emergency Medicine

## 2021-01-08 ENCOUNTER — Encounter: Payer: Self-pay | Admitting: Emergency Medicine

## 2021-01-08 NOTE — Telephone Encounter (Signed)
MyChart video conference invitation unsuccessful. Mobile #[phone] no longer patient's personal phone number.  Reached a different person who informed me of this. Called home phone [phone] and spoke to wife who states patient is not home at present time.

## 2021-02-02 DIAGNOSIS — N184 Chronic kidney disease, stage 4 (severe): Secondary | ICD-10-CM | POA: Diagnosis not present

## 2021-02-13 DIAGNOSIS — N184 Chronic kidney disease, stage 4 (severe): Secondary | ICD-10-CM | POA: Diagnosis not present

## 2021-02-17 ENCOUNTER — Encounter: Payer: Self-pay | Admitting: Emergency Medicine

## 2021-02-17 ENCOUNTER — Ambulatory Visit: Payer: Medicare PPO | Admitting: Emergency Medicine

## 2021-02-17 ENCOUNTER — Other Ambulatory Visit: Payer: Self-pay

## 2021-02-17 VITALS — BP 126/82 | HR 74 | Ht 69.0 in | Wt 205.0 lb

## 2021-02-17 DIAGNOSIS — R12 Heartburn: Secondary | ICD-10-CM | POA: Diagnosis not present

## 2021-02-17 DIAGNOSIS — R1010 Upper abdominal pain, unspecified: Secondary | ICD-10-CM | POA: Diagnosis not present

## 2021-02-17 DIAGNOSIS — I10 Essential (primary) hypertension: Secondary | ICD-10-CM

## 2021-02-17 DIAGNOSIS — N184 Chronic kidney disease, stage 4 (severe): Secondary | ICD-10-CM | POA: Diagnosis not present

## 2021-02-17 MED ORDER — PANTOPRAZOLE SODIUM 40 MG PO TBEC: 40.0000 mg | DELAYED_RELEASE_TABLET | Freq: Every day | ORAL | 3 refills | Status: DC

## 2021-02-17 NOTE — Patient Instructions (Signed)
Abdominal Pain, Adult Many things can cause belly (abdominal) pain. Most times, belly pain is not dangerous. Many cases of belly pain can be watched and treated at home. Sometimes, though, belly pain is serious. Yourdoctor will try to find the cause of your belly pain. Follow these instructions at home:  Medicines Take over-the-counter and prescription medicines only as told by your doctor. Do not take medicines that help you poop (laxatives) unless told by your doctor. General instructions Watch your belly pain for any changes. Drink enough fluid to keep your pee (urine) pale yellow. Keep all follow-up visits as told by your doctor. This is important. Contact a doctor if: Your belly pain changes or gets worse. You are not hungry, or you lose weight without trying. You are having trouble pooping (constipated) or have watery poop (diarrhea) for more than 2-3 days. You have pain when you pee or poop. Your belly pain wakes you up at night. Your pain gets worse with meals, after eating, or with certain foods. You are vomiting and cannot keep anything down. You have a fever. You have blood in your pee. Get help right away if: Your pain does not go away as soon as your doctor says it should. You cannot stop vomiting. Your pain is only in areas of your belly, such as the right side or the left lower part of the belly. You have bloody or black poop, or poop that looks like tar. You have very bad pain, cramping, or bloating in your belly. You have signs of not having enough fluid or water in your body (dehydration), such as: Dark pee, very little pee, or no pee. Cracked lips. Dry mouth. Sunken eyes. Sleepiness. Weakness. You have trouble breathing or chest pain. Summary Many cases of belly pain can be watched and treated at home. Watch your belly pain for any changes. Take over-the-counter and prescription medicines only as told by your doctor. Contact a doctor if your belly pain  changes or gets worse. Get help right away if you have very bad pain, cramping, or bloating in your belly. This information is not intended to replace advice given to you by your health care provider. Make sure you discuss any questions you have with your healthcare provider. Document Revised: 07/17/2018 Document Reviewed: 07/17/2018 Elsevier Patient Education  2022 Elsevier Inc.  

## 2021-02-17 NOTE — Assessment & Plan Note (Signed)
Differential diagnosis discussed.  Had recent blood work done. Will start PPI with pantoprazole 40 mg daily and request GI evaluation for possible upper endoscopy.

## 2021-02-17 NOTE — Progress Notes (Signed)
Christopher [name] 70 y.o.   Chief Complaint  Patient presents with   Hypertension    3 mon. F/u. Pt states that he has some stomach concerns, stomach burns at times, acid reflux.     HISTORY OF PRESENT ILLNESS: This is a 70 y.o. male with history of hypertension here for follow-up. Recently had blood work ordered by kidney doctor and was called back and told to stop lisinopril because potassium was high and kidney function was worse.  Scheduled to see her again February 25, 2019.  Patient off all medications at present time. Also complaining of 3 different episodes that happen at night about 10 PM on different occasions when he developed upper abdominal/epigastric burning pain waxing and waning for several hours.  Associated with nausea but no vomiting.  Denies melena or hematemesis.  Denies fever or chills.  Denies any other associated symptoms. No other complaints or medical concerns today.   HPI   Prior to Admission medications   Medication Sig Start Date End Date Taking? Authorizing Provider  ALPRAZolam Christopher [name]) 0.5 MG tablet Take 1 tablet (0.5 mg total) by mouth 2 (two) times daily as needed for anxiety. 09/10/17  Yes Horald Pollen, MD  amLODipine (NORVASC) 5 MG tablet TAKE 1 TABLET(5 MG) BY MOUTH DAILY 11/15/20  Yes Horald Pollen, MD  ASPIRIN 81 PO Take by mouth.   Yes [provider]  lisinopril (ZESTRIL) 10 MG tablet TAKE 1 TABLET BY MOUTH DAILY 11/15/20  Yes Naethan Bracewell, Ines Bloomer, MD  Multiple Vitamin (MULTIVITAMIN) tablet Take 1 tablet by mouth daily.   Yes [provider]  Omega-3 Fatty Acids (FISH OIL PO) Take 500 mg by mouth.   Yes [provider]  PARoxetine (PAXIL) 10 MG tablet Take 1 tablet (10 mg total) by mouth daily. 10/08/20 01/06/21  Horald Pollen, MD    No Known Allergies  Patient Active Problem List   Diagnosis Date Noted   Moderate episode of recurrent major depressive disorder ([place]) 10/08/2020   Stage 4 chronic  kidney disease ([place]) 04/16/2019   Memory deficits 04/16/2019   Kidney cysts 04/16/2019   Depression 08/26/2016   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   HTN (hypertension) 11/21/2011    Past Medical History:  Diagnosis Date   Anxiety    Cancer ([place])    bladder  cancer- stage 1- removed 2007   Chronic kidney disease    CKD    Depression    Hypertension     Past Surgical History:  Procedure Laterality Date   BLADDER SURGERY     COLONOSCOPY     JOINT REPLACEMENT  2016   knee right    POLYPECTOMY      Social History   Socioeconomic History   Marital status: Married    Spouse name: Not on file   Number of children: Not on file   Years of education: Not on file   Highest education level: Not on file  Occupational History   Not on file  Tobacco Use   Smoking status: Former    Types: Cigarettes    Quit date: 03/22/1972    Years since quitting: 48.9   Smokeless tobacco: Never  Substance and Sexual Activity   Alcohol use: Yes    Alcohol/week: 0.0 standard drinks    Comment: rare   Drug use: No   Sexual activity: Not on file  Other Topics Concern   Not on file  Social History Narrative   Not on  file   Social Determinants of Health   Financial Resource Strain: Not on file  Food Insecurity: Not on file  Transportation Needs: Not on file  Physical Activity: Not on file  Stress: Not on file  Social Connections: Not on file  Intimate Partner Violence: Not on file    Family History  Problem Relation Age of Onset   Cancer Mother    Cancer Father    Hypertension Father    Heart disease Father    Stomach cancer Father    Cancer Sister    Diabetes Sister    Heart disease Sister    Hypertension Sister    Mental illness Brother    Colon cancer Neg Hx    Colon polyps Neg Hx    Esophageal cancer Neg Hx    Rectal cancer Neg Hx      Review of Systems  Constitutional: Negative.  Negative for chills and fever.  HENT:  Negative for congestion,  ear pain and sore throat.   Respiratory: Negative.  Negative for cough and shortness of breath.   Cardiovascular: Negative.  Negative for chest pain and palpitations.  Gastrointestinal:  Positive for heartburn. Negative for abdominal pain, blood in stool, diarrhea, melena, nausea and vomiting.  Genitourinary: Negative.   Skin: Negative.  Negative for rash.  Neurological:  Negative for dizziness and headaches.  All other systems reviewed and are negative.  Today's Vitals   02/17/21 1342  BP: 126/82  Pulse: 74  SpO2: 99%  Weight: 205 lb (93 kg)  Height: 5\' 9"  (1.753 m)   Body mass index is 30.27 kg/m. Wt Readings from Last 3 Encounters:  02/17/21 205 lb (93 kg)  10/08/20 204 lb (92.5 kg)  04/10/20 205 lb (93 kg)    Physical Exam Vitals reviewed.  Constitutional:      Appearance: Normal appearance.  HENT:     Head: Normocephalic.  Eyes:     Extraocular Movements: Extraocular movements intact.     Conjunctiva/sclera: Conjunctivae normal.     Pupils: Pupils are equal, round, and reactive to light.  Cardiovascular:     Rate and Rhythm: Normal rate and regular rhythm.  Pulmonary:     Effort: Pulmonary effort is normal.     Breath sounds: Normal breath sounds.  Abdominal:     Palpations: Abdomen is soft.     Tenderness: There is no abdominal tenderness.  Musculoskeletal:        General: Normal range of motion.     Cervical back: Normal range of motion and neck supple.  Skin:    General: Skin is warm and dry.     Capillary Refill: Capillary refill takes less than 2 seconds.  Neurological:     General: No focal deficit present.     Mental Status: He is alert and oriented to person, place, and time.  Psychiatric:        Mood and Affect: Mood normal.        Behavior: Behavior normal.     ASSESSMENT & PLAN: Problem List Items Addressed This Visit       Cardiovascular and Mediastinum   HTN (hypertension)    Well-controlled hypertension, off medications. BP Readings  from Last 3 Encounters:  02/17/21 126/82  10/08/20 122/80  04/10/20 130/76           Genitourinary   Stage 4 chronic kidney disease ([place])    Apparently it is worsening and showing some hyperkalemia. Lisinopril was stopped.  Has follow-up visit with nephrologist  next week.        Other   Upper abdominal pain - Primary    Differential diagnosis discussed.  Had recent blood work done. Will start PPI with pantoprazole 40 mg daily and request GI evaluation for possible upper endoscopy.      Relevant Orders   Ambulatory referral to Gastroenterology   Heartburn   Relevant Medications   pantoprazole (PROTONIX) 40 MG tablet   Other Relevant Orders   Ambulatory referral to Gastroenterology   Patient Instructions  Abdominal Pain, Adult Many things can cause belly (abdominal) pain. Most times, belly pain is not dangerous. Many cases of belly pain can be watched and treated at home. Sometimes, though, belly pain is serious. Your doctor will try to find the cause of your belly pain. Follow these instructions at home: Medicines Take over-the-counter and prescription medicines only as told by your doctor. Do not take medicines that help you poop (laxatives) unless told by your doctor. General instructions Watch your belly pain for any changes. Drink enough fluid to keep your pee (urine) pale yellow. Keep all follow-up visits as told by your doctor. This is important. Contact a doctor if: Your belly pain changes or gets worse. You are not hungry, or you lose weight without trying. You are having trouble pooping (constipated) or have watery poop (diarrhea) for more than 2-3 days. You have pain when you pee or poop. Your belly pain wakes you up at night. Your pain gets worse with meals, after eating, or with certain foods. You are vomiting and cannot keep anything down. You have a fever. You have blood in your pee. Get help right away if: Your pain does not go away as soon as your  doctor says it should. You cannot stop vomiting. Your pain is only in areas of your belly, such as the right side or the left lower part of the belly. You have bloody or black poop, or poop that looks like tar. You have very bad pain, cramping, or bloating in your belly. You have signs of not having enough fluid or water in your body (dehydration), such as: Dark pee, very little pee, or no pee. Cracked lips. Dry mouth. Sunken eyes. Sleepiness. Weakness. You have trouble breathing or chest pain. Summary Many cases of belly pain can be watched and treated at home. Watch your belly pain for any changes. Take over-the-counter and prescription medicines only as told by your doctor. Contact a doctor if your belly pain changes or gets worse. Get help right away if you have very bad pain, cramping, or bloating in your belly. This information is not intended to replace advice given to you by your health care provider. Make sure you discuss any questions you have with your health care provider. Document Revised: 07/17/2018 Document Reviewed: 07/17/2018 Elsevier Patient Education  2022 [place], MD [facility]

## 2021-02-17 NOTE — Assessment & Plan Note (Signed)
Apparently it is worsening and showing some hyperkalemia. Lisinopril was stopped.  Has follow-up visit with nephrologist next week.

## 2021-02-17 NOTE — Assessment & Plan Note (Signed)
Well-controlled hypertension, off medications. BP Readings from Last 3 Encounters:  02/17/21 126/82  10/08/20 122/80  04/10/20 130/76

## 2021-02-24 DIAGNOSIS — I129 Hypertensive chronic kidney disease with stage 1 through stage 4 chronic kidney disease, or unspecified chronic kidney disease: Secondary | ICD-10-CM | POA: Diagnosis not present

## 2021-02-24 DIAGNOSIS — N281 Cyst of kidney, acquired: Secondary | ICD-10-CM | POA: Diagnosis not present

## 2021-02-24 DIAGNOSIS — N184 Chronic kidney disease, stage 4 (severe): Secondary | ICD-10-CM | POA: Diagnosis not present

## 2021-04-28 DIAGNOSIS — N281 Cyst of kidney, acquired: Secondary | ICD-10-CM | POA: Diagnosis not present

## 2021-04-28 DIAGNOSIS — N184 Chronic kidney disease, stage 4 (severe): Secondary | ICD-10-CM | POA: Diagnosis not present

## 2021-04-28 DIAGNOSIS — I129 Hypertensive chronic kidney disease with stage 1 through stage 4 chronic kidney disease, or unspecified chronic kidney disease: Secondary | ICD-10-CM | POA: Diagnosis not present

## 2021-05-27 ENCOUNTER — Encounter: Payer: Self-pay | Admitting: Internal Medicine

## 2021-05-27 ENCOUNTER — Ambulatory Visit: Payer: Medicare PPO | Admitting: Internal Medicine

## 2021-05-27 VITALS — BP 154/88 | HR 76 | Ht 69.0 in | Wt 213.0 lb

## 2021-05-27 DIAGNOSIS — R630 Anorexia: Secondary | ICD-10-CM | POA: Diagnosis not present

## 2021-05-27 DIAGNOSIS — R112 Nausea with vomiting, unspecified: Secondary | ICD-10-CM | POA: Diagnosis not present

## 2021-05-27 DIAGNOSIS — R195 Other fecal abnormalities: Secondary | ICD-10-CM

## 2021-05-27 DIAGNOSIS — R1013 Epigastric pain: Secondary | ICD-10-CM | POA: Diagnosis not present

## 2021-05-27 NOTE — Progress Notes (Signed)
HISTORY OF PRESENT ILLNESS: ? ?Christopher [name] is a 71 y.o. male with a remote history of bladder cancer, chronic kidney disease, hypertension, and anxiety/depression.  The patient presents today, with his wife, regarding chronic abdominal complaints and more recent acute abdominal complaints.  He does have a history of adenomatous colon polyps.  He last underwent complete colonoscopy December 11, 2019.  He was found to have diminutive adenomas and sigmoid diverticulosis.  Follow-up in 5 years recommended.  The patient tells me that since his retirement 5 years ago he has noticed decreased appetite associated with weight loss.  Again, this has been over the past 5 years.  Over the past months to year he tells me that he will have episodic issues with generalized abdominal burning discomfort.  Sometimes right upper quadrant.  There is underlying nausea.  He tells me that he had 1 rather severe episode of abdominal discomfort for which she took Pepto-Bismol.  He subsequently had dry heaves and diarrhea.  No problems like this prior or since.  He seems anxious.  Review of outside x-rays from January 2012 includes MRI and CT of the abdomen.  No acute abnormalities.  Review of outside blood work from July 2022 shows BUN of 36 and creatinine 2.75.  Hemoglobin 13.3. ? ?REVIEW OF SYSTEMS: ? ?All non-GI ROS negative unless otherwise stated in the HPI except for anxiety ? ?Past Medical History:  ?Diagnosis Date  ? Anxiety   ? Cancer [facility])   ? bladder  cancer- stage 1- removed 2007  ? Chronic kidney disease   ? CKD   ? Depression   ? Hypertension   ? ? ?Past Surgical History:  ?Procedure Laterality Date  ? BLADDER SURGERY    ? COLONOSCOPY    ? JOINT REPLACEMENT  2016  ? knee right   ? POLYPECTOMY    ? ? ?Social History ?Retia Passe  reports that he quit smoking about 49 years ago. His smoking use included cigarettes. He has never used smokeless tobacco. He reports current alcohol use. He reports that he does not use  drugs. ? ?family history includes Cancer in his father, mother, and sister; Diabetes in his sister; Heart disease in his father and sister; Hypertension in his father and sister; Mental illness in his brother; Stomach cancer in his father. ? ?No Known Allergies ? ?  ? ?PHYSICAL EXAMINATION: ?Vital signs: BP (!) 154/88   Pulse 76   Ht '5\' 9"'$  (1.753 m)   Wt 213 lb (96.6 kg)   BMI 31.45 kg/m?   ?Constitutional: generally well-appearing, no acute distress ?Psychiatric: alert and oriented x3, cooperative ?Eyes: extraocular movements intact, anicteric, conjunctiva pink ?Mouth: oral pharynx moist, no lesions ?Neck: supple no lymphadenopathy ?Cardiovascular: heart regular rate and rhythm, no murmur ?Lungs: clear to auscultation bilaterally ?Abdomen: soft, nontender, nondistended, no obvious ascites, no peritoneal signs, normal bowel sounds, no organomegaly ?Rectal: Omitted ?Extremities: no clubbing or cyanosis.  1-2+ lower extremity edema bilaterally ?Skin: no lesions on visible extremities ?Neuro: No focal deficits.  Cranial nerves intact ? ?ASSESSMENT: ? ?1.  Intermittent problems with abdominal discomfort.  Rule out gallstones.  Rule out acid peptic disorders ?2.  Acute episode of dry heaves and diarrhea.  Resolved ?3.  History of colon polyps.  Surveillance up-to-date.  Last colonoscopy 2021 ?4.  Multiple medical problems including chronic renal insufficiency ?5.  Chronic decreased appetite with weight loss.  Over the years.  May be related to underlying anxiety/depression ?6.  History of bladder cancer ? ?  PLAN: ? ?1.  Continue pantoprazole 40 mg daily (started about a month ago). ?2.  Schedule abdominal ultrasound.  Rule out gallstones ?3.  Upper endoscopy to evaluate abdominal pain.  Patient is high risk given his comorbidities.The nature of the procedure, as well as the risks, benefits, and alternatives were carefully and thoroughly reviewed with the patient. Ample time for discussion and questions allowed. The  patient understood, was satisfied, and agreed to proceed.  ?4.  If the above unrevealing then recommend contrast MRI to rule out pancreatic lesion. ?A total time of 40 minutes was spent preparing to see the patient, reviewing test and x-rays, obtaining comprehensive history, performing comprehensive physical exam, counseling the patient and his wife regarding the above listed issues, ordering medications, advanced radiology, and endoscopic procedure.  Finally, documenting clinical information in the health record ? ? ? ?  ?

## 2021-05-27 NOTE — Patient Instructions (Addendum)
If you are age 71 or older, your body mass index should be between 23-30. Your Body mass index is 31.45 kg/m?Marland Kitchen If this is out of the aforementioned range listed, please consider follow up with your Primary Care Provider. ? ?If you are age 77 or younger, your body mass index should be between 19-25. Your Body mass index is 31.45 kg/m?Marland Kitchen If this is out of the aformentioned range listed, please consider follow up with your Primary Care Provider.  ? ?________________________________________________________ ? ?The [facility] providers would like to encourage you to use [facility] to communicate with providers for non-urgent requests or questions.  Due to long hold times on the telephone, sending your provider a message by [facility] may be a faster and more efficient way to get a response.  Please allow 48 business hours for a response.  Please remember that this is for non-urgent requests.  ?_______________________________________________________ ? ?You have been scheduled for an endoscopy. Please follow written instructions given to you at your visit today. ?If you use inhalers (even only as needed), please bring them with you on the day of your procedure. ? ?You will be contacted by [place] in the next 2 days to arrange an abdominal ultrasound.  The number on your caller ID will be [phone], please answer when they call.  If you have not heard from them in 2 days please call [phone] to schedule.   ? ? ?

## 2021-06-02 DIAGNOSIS — N184 Chronic kidney disease, stage 4 (severe): Secondary | ICD-10-CM | POA: Diagnosis not present

## 2021-06-02 DIAGNOSIS — N281 Cyst of kidney, acquired: Secondary | ICD-10-CM | POA: Diagnosis not present

## 2021-06-02 DIAGNOSIS — I129 Hypertensive chronic kidney disease with stage 1 through stage 4 chronic kidney disease, or unspecified chronic kidney disease: Secondary | ICD-10-CM | POA: Diagnosis not present

## 2021-06-05 ENCOUNTER — Ambulatory Visit (HOSPITAL_COMMUNITY)
Admission: RE | Admit: 2021-06-05 | Discharge: 2021-06-05 | Disposition: A | Discharge status: Home / Self Care | Payer: Medicare PPO | Source: Ambulatory Visit | Attending: Internal Medicine | Admitting: Internal Medicine

## 2021-06-05 ENCOUNTER — Other Ambulatory Visit: Payer: Self-pay

## 2021-06-05 DIAGNOSIS — R112 Nausea with vomiting, unspecified: Secondary | ICD-10-CM | POA: Diagnosis not present

## 2021-06-05 DIAGNOSIS — R1013 Epigastric pain: Secondary | ICD-10-CM | POA: Diagnosis not present

## 2021-06-05 DIAGNOSIS — R195 Other fecal abnormalities: Secondary | ICD-10-CM | POA: Insufficient documentation

## 2021-06-05 DIAGNOSIS — R630 Anorexia: Secondary | ICD-10-CM | POA: Diagnosis not present

## 2021-06-05 DIAGNOSIS — N281 Cyst of kidney, acquired: Secondary | ICD-10-CM | POA: Diagnosis not present

## 2021-06-05 DIAGNOSIS — K7689 Other specified diseases of liver: Secondary | ICD-10-CM | POA: Diagnosis not present

## 2021-06-05 DIAGNOSIS — K8020 Calculus of gallbladder without cholecystitis without obstruction: Secondary | ICD-10-CM | POA: Diagnosis not present

## 2021-06-23 DIAGNOSIS — N281 Cyst of kidney, acquired: Secondary | ICD-10-CM | POA: Diagnosis not present

## 2021-06-23 DIAGNOSIS — I129 Hypertensive chronic kidney disease with stage 1 through stage 4 chronic kidney disease, or unspecified chronic kidney disease: Secondary | ICD-10-CM | POA: Diagnosis not present

## 2021-06-23 DIAGNOSIS — N184 Chronic kidney disease, stage 4 (severe): Secondary | ICD-10-CM | POA: Diagnosis not present

## 2021-06-24 ENCOUNTER — Other Ambulatory Visit: Payer: Self-pay | Admitting: Emergency Medicine

## 2021-06-24 DIAGNOSIS — R12 Heartburn: Secondary | ICD-10-CM

## 2021-06-30 DIAGNOSIS — K219 Gastro-esophageal reflux disease without esophagitis: Secondary | ICD-10-CM | POA: Diagnosis not present

## 2021-06-30 DIAGNOSIS — Z6831 Body mass index (BMI) 31.0-31.9, adult: Secondary | ICD-10-CM | POA: Diagnosis not present

## 2021-06-30 DIAGNOSIS — F325 Major depressive disorder, single episode, in full remission: Secondary | ICD-10-CM | POA: Diagnosis not present

## 2021-06-30 DIAGNOSIS — I10 Essential (primary) hypertension: Secondary | ICD-10-CM | POA: Diagnosis not present

## 2021-06-30 DIAGNOSIS — E669 Obesity, unspecified: Secondary | ICD-10-CM | POA: Diagnosis not present

## 2021-06-30 DIAGNOSIS — N400 Enlarged prostate without lower urinary tract symptoms: Secondary | ICD-10-CM | POA: Diagnosis not present

## 2021-06-30 DIAGNOSIS — Z8249 Family history of ischemic heart disease and other diseases of the circulatory system: Secondary | ICD-10-CM | POA: Diagnosis not present

## 2021-06-30 DIAGNOSIS — R600 Localized edema: Secondary | ICD-10-CM | POA: Diagnosis not present

## 2021-06-30 DIAGNOSIS — F419 Anxiety disorder, unspecified: Secondary | ICD-10-CM | POA: Diagnosis not present

## 2021-07-03 ENCOUNTER — Ambulatory Visit (AMBULATORY_SURGERY_CENTER): Payer: Medicare PPO | Admitting: Internal Medicine

## 2021-07-03 ENCOUNTER — Encounter: Payer: Self-pay | Admitting: Internal Medicine

## 2021-07-03 ENCOUNTER — Other Ambulatory Visit (INDEPENDENT_AMBULATORY_CARE_PROVIDER_SITE_OTHER): Payer: Medicare PPO

## 2021-07-03 VITALS — BP 128/75 | HR 68 | Temp 97.8°F | Ht 69.0 in | Wt 213.0 lb

## 2021-07-03 DIAGNOSIS — R112 Nausea with vomiting, unspecified: Secondary | ICD-10-CM

## 2021-07-03 DIAGNOSIS — R109 Unspecified abdominal pain: Secondary | ICD-10-CM

## 2021-07-03 DIAGNOSIS — N184 Chronic kidney disease, stage 4 (severe): Secondary | ICD-10-CM

## 2021-07-03 DIAGNOSIS — R630 Anorexia: Secondary | ICD-10-CM | POA: Diagnosis not present

## 2021-07-03 DIAGNOSIS — R1013 Epigastric pain: Secondary | ICD-10-CM

## 2021-07-03 DIAGNOSIS — N186 End stage renal disease: Secondary | ICD-10-CM

## 2021-07-03 DIAGNOSIS — E875 Hyperkalemia: Secondary | ICD-10-CM | POA: Diagnosis not present

## 2021-07-03 LAB — BASIC METABOLIC PANEL
BUN: 69 mg/dL — ABNORMAL HIGH (ref 6–23)
CO2: 26 mEq/L (ref 19–32)
Calcium: 9.3 mg/dL (ref 8.4–10.5)
Chloride: 105 mEq/L (ref 96–112)
Creatinine, Ser: 7.01 mg/dL (ref 0.40–1.50)
GFR: 7.36 mL/min — CL (ref >60.00)
Glucose, Bld: 85 mg/dL (ref 70–99)
Potassium: 5.7 mEq/L — ABNORMAL HIGH (ref 3.5–5.1)
Sodium: 140 mEq/L (ref 135–145)

## 2021-07-03 MED ORDER — SODIUM CHLORIDE 0.9 % IV SOLN: 500.0000 mL | INTRAVENOUS | Status: AC

## 2021-07-03 NOTE — Progress Notes (Signed)
HISTORY OF PRESENT ILLNESS: ?  ?Christopher [name] is a 71 y.o. male with a remote history of bladder cancer, chronic kidney disease, hypertension, and anxiety/depression.  The patient presents today, with his wife, regarding chronic abdominal complaints and more recent acute abdominal complaints.  He does have a history of adenomatous colon polyps.  He last underwent complete colonoscopy December 11, 2019.  He was found to have diminutive adenomas and sigmoid diverticulosis.  Follow-up in 5 years recommended.  The patient tells me that since his retirement 5 years ago he has noticed decreased appetite associated with weight loss.  Again, this has been over the past 5 years.  Over the past months to year he tells me that he will have episodic issues with generalized abdominal burning discomfort.  Sometimes right upper quadrant.  There is underlying nausea.  He tells me that he had 1 rather severe episode of abdominal discomfort for which she took Pepto-Bismol.  He subsequently had dry heaves and diarrhea.  No problems like this prior or since.  He seems anxious.  Review of outside x-rays from January 2012 includes MRI and CT of the abdomen.  No acute abnormalities.  Review of outside blood work from July 2022 shows BUN of 36 and creatinine 2.75.  Hemoglobin 13.3. ?  ?REVIEW OF SYSTEMS: ?  ?All non-GI ROS negative unless otherwise stated in the HPI except for anxiety ?  ?    ?Past Medical History:  ?Diagnosis Date  ? Anxiety    ? Cancer [facility])    ?  bladder  cancer- stage 1- removed 2007  ? Chronic kidney disease    ?  CKD   ? Depression    ? Hypertension    ?  ?  ?     ?Past Surgical History:  ?Procedure Laterality Date  ? BLADDER SURGERY      ? COLONOSCOPY      ? JOINT REPLACEMENT   2016  ?  knee right   ? POLYPECTOMY      ?  ?  ?Social History ?Retia Passe  reports that he quit smoking about 49 years ago. His smoking use included cigarettes. He has never used smokeless tobacco. He reports current alcohol  use. He reports that he does not use drugs. ?  ?family history includes Cancer in his father, mother, and sister; Diabetes in his sister; Heart disease in his father and sister; Hypertension in his father and sister; Mental illness in his brother; Stomach cancer in his father. ?  ?No Known Allergies ?  ?  ?  ?PHYSICAL EXAMINATION: ?Vital signs: BP (!) 154/88   Pulse 76   Ht '5\' 9"'$  (1.753 m)   Wt 213 lb (96.6 kg)   BMI 31.45 kg/m?   ?Constitutional: generally well-appearing, no acute distress ?Psychiatric: alert and oriented x3, cooperative ?Eyes: extraocular movements intact, anicteric, conjunctiva pink ?Mouth: oral pharynx moist, no lesions ?Neck: supple no lymphadenopathy ?Cardiovascular: heart regular rate and rhythm, no murmur ?Lungs: clear to auscultation bilaterally ?Abdomen: soft, nontender, nondistended, no obvious ascites, no peritoneal signs, normal bowel sounds, no organomegaly ?Rectal: Omitted ?Extremities: no clubbing or cyanosis.  1-2+ lower extremity edema bilaterally ?Skin: no lesions on visible extremities ?Neuro: No focal deficits.  Cranial nerves intact ?  ?ASSESSMENT: ?  ?1.  Intermittent problems with abdominal discomfort.  Rule out gallstones.  Rule out acid peptic disorders ?2.  Acute episode of dry heaves and diarrhea.  Resolved ?3.  History of colon polyps.  Surveillance up-to-date.  Last colonoscopy 2021 ?4.  Multiple medical problems including chronic renal insufficiency ?5.  Chronic decreased appetite with weight loss.  Over the years.  May be related to underlying anxiety/depression ?6.  History of bladder cancer ?  ?PLAN: ?  ?1.  Continue pantoprazole 40 mg daily (started about a month ago). ?2.  Schedule abdominal ultrasound.  Rule out gallstones ?3.  Upper endoscopy to evaluate abdominal pain.  Patient is high risk given his comorbidities.The nature of the procedure, as well as the risks, benefits, and alternatives were carefully and thoroughly reviewed with the patient. Ample time  for discussion and questions allowed. The patient understood, was satisfied, and agreed to proceed.  ?4.  If the above unrevealing then recommend contrast MRI to rule out pancreatic lesion. ? ?Recent office evaluation with complete H&P as outlined above.  No interval clinical change.  Ultrasound did show gallstones.  He is now for upper endoscopy. ? ?ADDENDUM: ?Anesthesia chart review shows that last basic metabolic panel revealed a potassium of 5.7.  Cannot proceed with the patient's case with potassium at this level.  He has renal insufficiency.  We will check a stat basic metabolic panel today to see if this has improved or not.  He will need renal evaluation. ? ?Laboratories returned.  Potassium 5.7.  Creatinine 7. ?Procedure canceled ?PCP and nephrologist notified ?Discussed with patient and wife ?Patient and wife told to call PCP and nephrologist for neck steps ?

## 2021-07-03 NOTE — Progress Notes (Signed)
Patient seeing nephrology for stage 4 kidney disease and planning for dialysis. Dr.Perry made aware, BMP ordered.  ?K+5.7, procedure canceled. Dr.Perry discussed labs and POC with patient.  ?Patient and spouse verbalized understanding. Patient discharged home. ?

## 2021-07-03 NOTE — Progress Notes (Signed)
Call patient please.  Blood work done today at  Dr. Blanch Media office shows worsening renal function with hyperkalemia.  Not sure about symptoms but at this point I recommend he goes to emergency department for further evaluation and possible dialysis.

## 2021-07-06 ENCOUNTER — Encounter (HOSPITAL_COMMUNITY): Payer: Self-pay | Admitting: Emergency Medicine

## 2021-07-06 ENCOUNTER — Other Ambulatory Visit: Payer: Self-pay

## 2021-07-06 ENCOUNTER — Inpatient Hospital Stay (HOSPITAL_COMMUNITY)
Admission: EM | Admit: 2021-07-06 | Discharge: 2021-07-11 | DRG: 674 | Disposition: A | Discharge status: Home / Self Care | Payer: Medicare PPO | Attending: Obstetrics and Gynecology | Admitting: Obstetrics and Gynecology

## 2021-07-06 ENCOUNTER — Telehealth: Payer: Self-pay

## 2021-07-06 ENCOUNTER — Emergency Department (HOSPITAL_COMMUNITY): Payer: Medicare PPO

## 2021-07-06 DIAGNOSIS — K319 Disease of stomach and duodenum, unspecified: Secondary | ICD-10-CM | POA: Diagnosis present

## 2021-07-06 DIAGNOSIS — N250 Renal osteodystrophy: Secondary | ICD-10-CM | POA: Diagnosis present

## 2021-07-06 DIAGNOSIS — C679 Malignant neoplasm of bladder, unspecified: Secondary | ICD-10-CM | POA: Diagnosis present

## 2021-07-06 DIAGNOSIS — I480 Paroxysmal atrial fibrillation: Secondary | ICD-10-CM | POA: Diagnosis present

## 2021-07-06 DIAGNOSIS — E875 Hyperkalemia: Secondary | ICD-10-CM | POA: Diagnosis present

## 2021-07-06 DIAGNOSIS — Z818 Family history of other mental and behavioral disorders: Secondary | ICD-10-CM | POA: Diagnosis not present

## 2021-07-06 DIAGNOSIS — R110 Nausea: Secondary | ICD-10-CM | POA: Diagnosis not present

## 2021-07-06 DIAGNOSIS — Z96651 Presence of right artificial knee joint: Secondary | ICD-10-CM | POA: Diagnosis present

## 2021-07-06 DIAGNOSIS — K2970 Gastritis, unspecified, without bleeding: Secondary | ICD-10-CM

## 2021-07-06 DIAGNOSIS — I10 Essential (primary) hypertension: Secondary | ICD-10-CM | POA: Diagnosis present

## 2021-07-06 DIAGNOSIS — F419 Anxiety disorder, unspecified: Secondary | ICD-10-CM | POA: Diagnosis present

## 2021-07-06 DIAGNOSIS — R109 Unspecified abdominal pain: Secondary | ICD-10-CM | POA: Diagnosis not present

## 2021-07-06 DIAGNOSIS — Z833 Family history of diabetes mellitus: Secondary | ICD-10-CM

## 2021-07-06 DIAGNOSIS — F32A Depression, unspecified: Secondary | ICD-10-CM | POA: Diagnosis present

## 2021-07-06 DIAGNOSIS — R634 Abnormal weight loss: Secondary | ICD-10-CM

## 2021-07-06 DIAGNOSIS — Z79899 Other long term (current) drug therapy: Secondary | ICD-10-CM

## 2021-07-06 DIAGNOSIS — I4891 Unspecified atrial fibrillation: Secondary | ICD-10-CM | POA: Diagnosis present

## 2021-07-06 DIAGNOSIS — Z8551 Personal history of malignant neoplasm of bladder: Secondary | ICD-10-CM | POA: Diagnosis not present

## 2021-07-06 DIAGNOSIS — K3189 Other diseases of stomach and duodenum: Secondary | ICD-10-CM | POA: Diagnosis not present

## 2021-07-06 DIAGNOSIS — K5730 Diverticulosis of large intestine without perforation or abscess without bleeding: Secondary | ICD-10-CM | POA: Diagnosis present

## 2021-07-06 DIAGNOSIS — Z800 Family history of malignant neoplasm of digestive organs: Secondary | ICD-10-CM

## 2021-07-06 DIAGNOSIS — Z7982 Long term (current) use of aspirin: Secondary | ICD-10-CM | POA: Diagnosis not present

## 2021-07-06 DIAGNOSIS — R609 Edema, unspecified: Secondary | ICD-10-CM | POA: Diagnosis not present

## 2021-07-06 DIAGNOSIS — J9811 Atelectasis: Secondary | ICD-10-CM | POA: Diagnosis not present

## 2021-07-06 DIAGNOSIS — R630 Anorexia: Secondary | ICD-10-CM | POA: Diagnosis present

## 2021-07-06 DIAGNOSIS — Z8249 Family history of ischemic heart disease and other diseases of the circulatory system: Secondary | ICD-10-CM | POA: Diagnosis not present

## 2021-07-06 DIAGNOSIS — R531 Weakness: Secondary | ICD-10-CM | POA: Diagnosis present

## 2021-07-06 DIAGNOSIS — N179 Acute kidney failure, unspecified: Principal | ICD-10-CM | POA: Diagnosis present

## 2021-07-06 DIAGNOSIS — D631 Anemia in chronic kidney disease: Secondary | ICD-10-CM | POA: Diagnosis present

## 2021-07-06 DIAGNOSIS — Z992 Dependence on renal dialysis: Secondary | ICD-10-CM

## 2021-07-06 DIAGNOSIS — K2990 Gastroduodenitis, unspecified, without bleeding: Secondary | ICD-10-CM | POA: Diagnosis present

## 2021-07-06 DIAGNOSIS — Z6829 Body mass index (BMI) 29.0-29.9, adult: Secondary | ICD-10-CM

## 2021-07-06 DIAGNOSIS — K8020 Calculus of gallbladder without cholecystitis without obstruction: Secondary | ICD-10-CM | POA: Diagnosis present

## 2021-07-06 DIAGNOSIS — I120 Hypertensive chronic kidney disease with stage 5 chronic kidney disease or end stage renal disease: Secondary | ICD-10-CM | POA: Diagnosis present

## 2021-07-06 DIAGNOSIS — Z87891 Personal history of nicotine dependence: Secondary | ICD-10-CM | POA: Diagnosis not present

## 2021-07-06 DIAGNOSIS — Z452 Encounter for adjustment and management of vascular access device: Secondary | ICD-10-CM | POA: Diagnosis not present

## 2021-07-06 DIAGNOSIS — N19 Unspecified kidney failure: Secondary | ICD-10-CM | POA: Diagnosis not present

## 2021-07-06 DIAGNOSIS — N281 Cyst of kidney, acquired: Secondary | ICD-10-CM | POA: Diagnosis not present

## 2021-07-06 DIAGNOSIS — N189 Chronic kidney disease, unspecified: Secondary | ICD-10-CM | POA: Diagnosis not present

## 2021-07-06 DIAGNOSIS — J811 Chronic pulmonary edema: Secondary | ICD-10-CM | POA: Diagnosis not present

## 2021-07-06 DIAGNOSIS — N186 End stage renal disease: Secondary | ICD-10-CM | POA: Diagnosis present

## 2021-07-06 LAB — BASIC METABOLIC PANEL
Anion gap: 11 (ref 5–15)
BUN: 69 mg/dL — ABNORMAL HIGH (ref 8–23)
CO2: 23 mmol/L (ref 22–32)
Calcium: 9.1 mg/dL (ref 8.9–10.3)
Chloride: 106 mmol/L (ref 98–111)
Creatinine, Ser: 7.25 mg/dL — ABNORMAL HIGH (ref 0.61–1.24)
GFR, Estimated: 8 mL/min — ABNORMAL LOW (ref >60)
Glucose, Bld: 96 mg/dL (ref 70–99)
Potassium: 4.6 mmol/L (ref 3.5–5.1)
Sodium: 140 mmol/L (ref 135–145)

## 2021-07-06 LAB — CBC
HCT: 33 % — ABNORMAL LOW (ref 39.0–52.0)
Hemoglobin: 10.5 g/dL — ABNORMAL LOW (ref 13.0–17.0)
MCH: 27.9 pg (ref 26.0–34.0)
MCHC: 31.8 g/dL (ref 30.0–36.0)
MCV: 87.8 fL (ref 80.0–100.0)
Platelets: 333 10*3/uL (ref 150–400)
RBC: 3.76 MIL/uL — ABNORMAL LOW (ref 4.22–5.81)
RDW: 15.1 % (ref 11.5–15.5)
WBC: 6.8 10*3/uL (ref 4.0–10.5)
nRBC: 0 % (ref 0.0–0.2)

## 2021-07-06 LAB — URINALYSIS, ROUTINE W REFLEX MICROSCOPIC
Bacteria, UA: NONE SEEN
Bilirubin Urine: NEGATIVE
Glucose, UA: NEGATIVE mg/dL
Hgb urine dipstick: NEGATIVE
Ketones, ur: NEGATIVE mg/dL
Leukocytes,Ua: NEGATIVE
Nitrite: NEGATIVE
Protein, ur: 100 mg/dL — AB
Specific Gravity, Urine: 1.014 (ref 1.005–1.030)
pH: 5 (ref 5.0–8.0)

## 2021-07-06 MED ORDER — SODIUM CHLORIDE 0.9% FLUSH
3.0000 mL | Freq: Once | INTRAVENOUS | Status: AC
  Administered 2021-07-07: 3 mL via INTRAVENOUS

## 2021-07-06 MED ORDER — DILTIAZEM HCL 25 MG/5ML IV SOLN
20.0000 mg | Freq: Once | INTRAVENOUS | Status: AC
  Administered 2021-07-07: 20 mg via INTRAVENOUS
  Filled 2021-07-06: qty 5

## 2021-07-06 NOTE — ED Triage Notes (Signed)
Pt states he was scheduled for endoscopy on Friday but they didn't do it due to Potassium 5.7.  Seen by PCP and sent to ED.  Reports generalized weakness x 2-3 months.  Denies pain.   ?

## 2021-07-06 NOTE — Telephone Encounter (Signed)
I advised the pt of Dr. Mitchel Honour result note stating Blood work done 07/03/21 at  Dr. Blanch Media office shows worsening renal function with hyperkalemia.  Not sure about symptoms but at this point I recommend he goes to emergency department for further evaluation and possible dialysis.  ? ?Pt was very hesitate about going to the ED. However after speaking to his wife, pt has decided to go.  ? ?FYI ?

## 2021-07-06 NOTE — ED Provider Triage Note (Signed)
Emergency Medicine Provider Triage Evaluation Note ? ?Christopher [name] , a 71 y.o. male  was evaluated in triage.  Pt complains of abnormal labs. Pt was scheduled to have an EGD due to having ongoing abdominal pain. He mentioned to them hx of CKD and they repeated blood work. He got a call today from his nephrologist who recommend he come to the ED for further eval. ? ?Creatinine 7/01 and potassium 5.7 on 04/14. Baseline kidney function ~ 2.75.  ?Review of Systems  ?Positive: + abnormal blood work ?Negative:  ? ?Physical Exam  ?BP (!) 154/91 (BP Location: Right Arm)   Pulse 73   Temp 98.3 ?F (36.8 ?C) (Oral)   Resp 18   SpO2 100%  ?Gen:   Awake, no distress   ?Resp:  Normal effort  ?MSK:   Moves extremities without difficulty  ?Other:   ? ?Medical Decision Making  ?Medically screening exam initiated at 4:08 PM.  Appropriate orders placed.  Retia Passe was informed that the remainder of the evaluation will be completed by another provider, this initial triage assessment does not replace that evaluation, and the importance of remaining in the ED until their evaluation is complete. ? ? ?  ?Eustaquio Maize, PA-C ?07/06/21 1610 ? ?

## 2021-07-06 NOTE — ED Provider Notes (Signed)
?[facility] ?Provider Note ? ? ?CSN: [id number] ?Arrival date & time: 07/06/21  1513 ? ?  ? ?History ? ?Chief Complaint  ?Patient presents with  ? abnormal labs  ? Weakness  ? ? ?Christopher [name] is a 71 y.o. male. ? ?Patient had an EGD pre-op last week showing significant worsening kidney function and potassium. Sent to PCP who called him and sent him here. Has some LE edema. Urinating normally. Sees Dr. Moshe Cipro for nephrology.  ? ? ?Weakness ? ?  ? ?Home Medications ?Prior to Admission medications   ?Medication Sig Start Date End Date Taking? Authorizing Provider  ?amLODipine (NORVASC) 5 MG tablet TAKE 1 TABLET(5 MG) BY MOUTH DAILY 11/15/20  Yes Horald Pollen, MD  ?ASPIRIN 81 PO Take 1 tablet by mouth daily.   Yes [provider]  ?furosemide (LASIX) 40 MG tablet Take 40 mg by mouth daily. 06/02/21  Yes [provider]  ?lisinopril (ZESTRIL) 10 MG tablet TAKE 1 TABLET BY MOUTH DAILY 11/15/20  Yes Sagardia, Ines Bloomer, MD  ?Multiple Vitamin (MULTIVITAMIN) tablet Take 1 tablet by mouth daily.   Yes [provider]  ?Omega-3 Fatty Acids (FISH OIL PO) Take 500 mg by mouth.   Yes [provider]  ?pantoprazole (PROTONIX) 40 MG tablet TAKE 1 TABLET(40 MG) BY MOUTH DAILY 06/24/21  Yes Sagardia, Ines Bloomer, MD  ?PARoxetine (PAXIL) 40 MG tablet Take 40 mg by mouth daily. 06/14/21  Yes [provider]  ?tamsulosin (FLOMAX) 0.4 MG CAPS capsule Take 0.4 mg by mouth daily. 04/28/21  Yes [provider]  ?ALPRAZolam Duanne Moron) 0.5 MG tablet Take 1 tablet (0.5 mg total) by mouth 2 (two) times daily as needed for anxiety. ?Patient not taking: Reported on 07/07/2021 09/10/17   Horald Pollen, MD  ?   ? ?Allergies    ?Patient has no known allergies.   ? ?Review of Systems   ?Review of Systems  ?Neurological:  Positive for weakness.  ? ?Physical Exam ?Updated Vital Signs ?BP (!) 140/94   Pulse (!) 55   Temp 97.9 ?F (36.6 ?C)  (Oral)   Resp 15   Ht '5\' 9"'$  (1.753 m)   Wt 94.1 kg   SpO2 96%   BMI 30.64 kg/m?  ?Physical Exam ?Vitals and nursing note reviewed.  ?Constitutional:   ?   Appearance: He is well-developed.  ?HENT:  ?   Head: Normocephalic and atraumatic.  ?   Mouth/Throat:  ?   Mouth: Mucous membranes are moist.  ?   Pharynx: Oropharynx is clear.  ?Eyes:  ?   Pupils: Pupils are equal, round, and reactive to light.  ?Cardiovascular:  ?   Rate and Rhythm: Tachycardia present. Rhythm irregular.  ?Pulmonary:  ?   Effort: Pulmonary effort is normal. No respiratory distress.  ?Abdominal:  ?   General: Abdomen is flat. There is no distension.  ?Musculoskeletal:     ?   General: No swelling or tenderness. Normal range of motion.  ?   Cervical back: Normal range of motion.  ?   Right lower leg: Edema present.  ?   Left lower leg: Edema present.  ?Skin: ?   General: Skin is warm and dry.  ?Neurological:  ?   General: No focal deficit present.  ?   Mental Status: He is alert.  ? ? ?ED Results / Procedures / Treatments   ?Labs ?(all labs ordered are listed, but only abnormal results are displayed) ?Labs Reviewed  ?  BASIC METABOLIC PANEL - Abnormal; Notable for the following components:  ?    Result Value  ? BUN 69 (*)   ? Creatinine, Ser 7.25 (*)   ? GFR, Estimated 8 (*)   ? All other components within normal limits  ?CBC - Abnormal; Notable for the following components:  ? RBC 3.76 (*)   ? Hemoglobin 10.5 (*)   ? HCT 33.0 (*)   ? All other components within normal limits  ?URINALYSIS, ROUTINE W REFLEX MICROSCOPIC - Abnormal; Notable for the following components:  ? Protein, ur 100 (*)   ? All other components within normal limits  ?BRAIN NATRIURETIC PEPTIDE - Abnormal; Notable for the following components:  ? B Natriuretic Peptide 199.5 (*)   ? All other components within normal limits  ?PHOSPHORUS - Abnormal; Notable for the following components:  ? Phosphorus 6.4 (*)   ? All other components within normal limits  ?HEPATIC FUNCTION PANEL  - Abnormal; Notable for the following components:  ? Albumin 3.3 (*)   ? AST 12 (*)   ? All other components within normal limits  ?IRON AND TIBC - Abnormal; Notable for the following components:  ? Iron 18 (*)   ? Saturation Ratios 6 (*)   ? All other components within normal limits  ?RETICULOCYTES - Abnormal; Notable for the following components:  ? RBC. 3.94 (*)   ? All other components within normal limits  ?CREATININE, SERUM - Abnormal; Notable for the following components:  ? Creatinine, Ser 7.31 (*)   ? GFR, Estimated 7 (*)   ? All other components within normal limits  ?COMPREHENSIVE METABOLIC PANEL - Abnormal; Notable for the following components:  ? Glucose, Bld 102 (*)   ? BUN 76 (*)   ? Creatinine, Ser 7.28 (*)   ? Total Protein 6.1 (*)   ? Albumin 3.0 (*)   ? AST 12 (*)   ? GFR, Estimated 7 (*)   ? All other components within normal limits  ?CBC WITH DIFFERENTIAL/PLATELET - Abnormal; Notable for the following components:  ? RBC 3.49 (*)   ? Hemoglobin 10.0 (*)   ? HCT 30.5 (*)   ? All other components within normal limits  ?PHOSPHORUS - Abnormal; Notable for the following components:  ? Phosphorus 6.9 (*)   ? All other components within normal limits  ?CBC WITH DIFFERENTIAL/PLATELET - Abnormal; Notable for the following components:  ? RBC 3.24 (*)   ? Hemoglobin 9.1 (*)   ? HCT 28.1 (*)   ? All other components within normal limits  ?RENAL FUNCTION PANEL - Abnormal; Notable for the following components:  ? BUN 77 (*)   ? Creatinine, Ser 7.54 (*)   ? Calcium 8.8 (*)   ? Phosphorus 6.4 (*)   ? Albumin 2.8 (*)   ? GFR, Estimated 7 (*)   ? All other components within normal limits  ?HEPARIN LEVEL (UNFRACTIONATED) - Abnormal; Notable for the following components:  ? Heparin Unfractionated 0.75 (*)   ? All other components within normal limits  ?TROPONIN I (HIGH SENSITIVITY) - Abnormal; Notable for the following components:  ? Troponin I (High Sensitivity) 28 (*)   ? All other components within normal limits   ?TROPONIN I (HIGH SENSITIVITY) - Abnormal; Notable for the following components:  ? Troponin I (High Sensitivity) 31 (*)   ? All other components within normal limits  ?MAGNESIUM  ?VITAMIN B12  ?FERRITIN  ?FOLATE  ?TSH  ?HIV ANTIBODY (ROUTINE TESTING W REFLEX)  ?  SODIUM, URINE, RANDOM  ?CREATININE, URINE, RANDOM  ?HEPATITIS B SURFACE ANTIGEN  ?HEPARIN LEVEL (UNFRACTIONATED)  ?HEPATITIS B SURFACE ANTIBODY, QUANTITATIVE  ?HEPATITIS B E ANTIBODY  ?CBC  ?HEPARIN LEVEL (UNFRACTIONATED)  ?POC OCCULT BLOOD, ED  ? ? ?EKG ?EKG Interpretation ? ?Date/Time:  Monday July 06 2021 22:31:04 EDT ?Ventricular Rate:  148 ?PR Interval:  142 ?QRS Duration: 86 ?QT Interval:  258 ?QTC Calculation: 405 ?R Axis:   5 ?Text Interpretation: Atrial fibrillation with rapid ventricular response Nonspecific ST abnormality Abnormal ECG When compared with ECG of 06-Jul-2021 22:26, PREVIOUS ECG IS PRESENT Confirmed by Merrily Pew [phone]) on 07/06/2021 11:09:41 PM ? ?Radiology ?US RENAL ? ?Result Date: 07/07/2021 ?CLINICAL DATA:  Renal failure. EXAM: RENAL / URINARY TRACT ULTRASOUND COMPLETE COMPARISON:  Abdominal ultrasound 06/05/2021 FINDINGS: Right Kidney: Renal measurements: 8.7 x 4.1 x 3.7 cm = volume: 70 mL. 5.5 cm cystic lesion identified upper pole right kidney. 3 cm cystic lesion identified towards the interpolar region. No hydronephrosis. Left Kidney: Left kidney could not be visualized. Bladder: Appears normal for degree of bladder distention. Other: None. IMPRESSION: 1. No hydronephrosis in the right kidney. Left kidney could not be visualized. 2. Similar appearance 5.5 cm simple appearing cyst in the upper pole right kidney with smaller interpolar cystic lesion evident. Electronically Signed   By: Misty Stanley M.D.   On: 07/07/2021 07:08  ? ?DG Chest Portable 1 View ? ?Result Date: 07/06/2021 ?CLINICAL DATA:  Pulmonary edema EXAM: PORTABLE CHEST 1 VIEW COMPARISON:  08/03/2008 FINDINGS: Mild bibasilar atelectasis. Lungs are otherwise  clear. No pneumothorax or pleural effusion. Cardiac size within normal limits. Pulmonary vascularity is normal. No acute bone abnormality. IMPRESSION: No active disease. Electronically Signed   By: Linwood Dibbles

## 2021-07-07 ENCOUNTER — Inpatient Hospital Stay (HOSPITAL_COMMUNITY): Payer: Medicare PPO

## 2021-07-07 ENCOUNTER — Encounter (HOSPITAL_COMMUNITY): Payer: Self-pay | Admitting: Internal Medicine

## 2021-07-07 ENCOUNTER — Inpatient Hospital Stay (HOSPITAL_COMMUNITY)
Admit: 2021-07-07 | Discharge: 2021-07-07 | Disposition: A | Discharge status: Home / Self Care | Payer: Medicare PPO | Attending: Internal Medicine | Admitting: Internal Medicine

## 2021-07-07 DIAGNOSIS — Z800 Family history of malignant neoplasm of digestive organs: Secondary | ICD-10-CM | POA: Diagnosis not present

## 2021-07-07 DIAGNOSIS — K8020 Calculus of gallbladder without cholecystitis without obstruction: Secondary | ICD-10-CM | POA: Diagnosis present

## 2021-07-07 DIAGNOSIS — Z87891 Personal history of nicotine dependence: Secondary | ICD-10-CM | POA: Diagnosis not present

## 2021-07-07 DIAGNOSIS — N186 End stage renal disease: Secondary | ICD-10-CM

## 2021-07-07 DIAGNOSIS — F419 Anxiety disorder, unspecified: Secondary | ICD-10-CM | POA: Diagnosis present

## 2021-07-07 DIAGNOSIS — Z992 Dependence on renal dialysis: Secondary | ICD-10-CM | POA: Diagnosis not present

## 2021-07-07 DIAGNOSIS — N179 Acute kidney failure, unspecified: Principal | ICD-10-CM

## 2021-07-07 DIAGNOSIS — D631 Anemia in chronic kidney disease: Secondary | ICD-10-CM | POA: Diagnosis present

## 2021-07-07 DIAGNOSIS — I4891 Unspecified atrial fibrillation: Secondary | ICD-10-CM

## 2021-07-07 DIAGNOSIS — R531 Weakness: Secondary | ICD-10-CM | POA: Diagnosis present

## 2021-07-07 DIAGNOSIS — R109 Unspecified abdominal pain: Secondary | ICD-10-CM | POA: Diagnosis not present

## 2021-07-07 DIAGNOSIS — R609 Edema, unspecified: Secondary | ICD-10-CM

## 2021-07-07 DIAGNOSIS — K319 Disease of stomach and duodenum, unspecified: Secondary | ICD-10-CM | POA: Diagnosis present

## 2021-07-07 DIAGNOSIS — Z8249 Family history of ischemic heart disease and other diseases of the circulatory system: Secondary | ICD-10-CM | POA: Diagnosis not present

## 2021-07-07 DIAGNOSIS — Z8551 Personal history of malignant neoplasm of bladder: Secondary | ICD-10-CM | POA: Diagnosis not present

## 2021-07-07 DIAGNOSIS — E875 Hyperkalemia: Secondary | ICD-10-CM

## 2021-07-07 DIAGNOSIS — I10 Essential (primary) hypertension: Secondary | ICD-10-CM

## 2021-07-07 DIAGNOSIS — Z7982 Long term (current) use of aspirin: Secondary | ICD-10-CM | POA: Diagnosis not present

## 2021-07-07 DIAGNOSIS — Z818 Family history of other mental and behavioral disorders: Secondary | ICD-10-CM | POA: Diagnosis not present

## 2021-07-07 DIAGNOSIS — N189 Chronic kidney disease, unspecified: Secondary | ICD-10-CM

## 2021-07-07 DIAGNOSIS — R630 Anorexia: Secondary | ICD-10-CM | POA: Diagnosis present

## 2021-07-07 DIAGNOSIS — R110 Nausea: Secondary | ICD-10-CM | POA: Diagnosis not present

## 2021-07-07 DIAGNOSIS — C679 Malignant neoplasm of bladder, unspecified: Secondary | ICD-10-CM | POA: Diagnosis not present

## 2021-07-07 DIAGNOSIS — N250 Renal osteodystrophy: Secondary | ICD-10-CM | POA: Diagnosis present

## 2021-07-07 DIAGNOSIS — I480 Paroxysmal atrial fibrillation: Secondary | ICD-10-CM | POA: Diagnosis present

## 2021-07-07 DIAGNOSIS — Z79899 Other long term (current) drug therapy: Secondary | ICD-10-CM | POA: Diagnosis not present

## 2021-07-07 DIAGNOSIS — Z96651 Presence of right artificial knee joint: Secondary | ICD-10-CM | POA: Diagnosis present

## 2021-07-07 DIAGNOSIS — F32A Depression, unspecified: Secondary | ICD-10-CM | POA: Diagnosis present

## 2021-07-07 DIAGNOSIS — Z6829 Body mass index (BMI) 29.0-29.9, adult: Secondary | ICD-10-CM | POA: Diagnosis not present

## 2021-07-07 DIAGNOSIS — Z833 Family history of diabetes mellitus: Secondary | ICD-10-CM | POA: Diagnosis not present

## 2021-07-07 DIAGNOSIS — K3189 Other diseases of stomach and duodenum: Secondary | ICD-10-CM | POA: Diagnosis not present

## 2021-07-07 DIAGNOSIS — R634 Abnormal weight loss: Secondary | ICD-10-CM | POA: Diagnosis present

## 2021-07-07 DIAGNOSIS — I120 Hypertensive chronic kidney disease with stage 5 chronic kidney disease or end stage renal disease: Secondary | ICD-10-CM | POA: Diagnosis present

## 2021-07-07 LAB — CBC WITH DIFFERENTIAL/PLATELET
Abs Immature Granulocytes: 0.03 10*3/uL (ref 0.00–0.07)
Basophils Absolute: 0 10*3/uL (ref 0.0–0.1)
Basophils Relative: 1 %
Eosinophils Absolute: 0.2 10*3/uL (ref 0.0–0.5)
Eosinophils Relative: 3 %
HCT: 30.5 % — ABNORMAL LOW (ref 39.0–52.0)
Hemoglobin: 10 g/dL — ABNORMAL LOW (ref 13.0–17.0)
Immature Granulocytes: 0 %
Lymphocytes Relative: 18 %
Lymphs Abs: 1.2 10*3/uL (ref 0.7–4.0)
MCH: 28.7 pg (ref 26.0–34.0)
MCHC: 32.8 g/dL (ref 30.0–36.0)
MCV: 87.4 fL (ref 80.0–100.0)
Monocytes Absolute: 0.7 10*3/uL (ref 0.1–1.0)
Monocytes Relative: 11 %
Neutro Abs: 4.7 10*3/uL (ref 1.7–7.7)
Neutrophils Relative %: 67 %
Platelets: 321 10*3/uL (ref 150–400)
RBC: 3.49 MIL/uL — ABNORMAL LOW (ref 4.22–5.81)
RDW: 15.5 % (ref 11.5–15.5)
WBC: 7 10*3/uL (ref 4.0–10.5)
nRBC: 0 % (ref 0.0–0.2)

## 2021-07-07 LAB — COMPREHENSIVE METABOLIC PANEL
ALT: 8 U/L (ref 0–44)
AST: 12 U/L — ABNORMAL LOW (ref 15–41)
Albumin: 3 g/dL — ABNORMAL LOW (ref 3.5–5.0)
Alkaline Phosphatase: 83 U/L (ref 38–126)
Anion gap: 10 (ref 5–15)
BUN: 76 mg/dL — ABNORMAL HIGH (ref 8–23)
CO2: 24 mmol/L (ref 22–32)
Calcium: 9.1 mg/dL (ref 8.9–10.3)
Chloride: 108 mmol/L (ref 98–111)
Creatinine, Ser: 7.28 mg/dL — ABNORMAL HIGH (ref 0.61–1.24)
GFR, Estimated: 7 mL/min — ABNORMAL LOW (ref >60)
Glucose, Bld: 102 mg/dL — ABNORMAL HIGH (ref 70–99)
Potassium: 4.8 mmol/L (ref 3.5–5.1)
Sodium: 142 mmol/L (ref 135–145)
Total Bilirubin: 0.4 mg/dL (ref 0.3–1.2)
Total Protein: 6.1 g/dL — ABNORMAL LOW (ref 6.5–8.1)

## 2021-07-07 LAB — CREATININE, SERUM
Creatinine, Ser: 7.31 mg/dL — ABNORMAL HIGH (ref 0.61–1.24)
GFR, Estimated: 7 mL/min — ABNORMAL LOW (ref >60)

## 2021-07-07 LAB — RETICULOCYTES
Immature Retic Fract: 9.8 % (ref 2.3–15.9)
RBC.: 3.94 MIL/uL — ABNORMAL LOW (ref 4.22–5.81)
Retic Count, Absolute: 47.7 10*3/uL (ref 19.0–186.0)
Retic Ct Pct: 1.2 % (ref 0.4–3.1)

## 2021-07-07 LAB — HEPATIC FUNCTION PANEL
ALT: 8 U/L (ref 0–44)
AST: 12 U/L — ABNORMAL LOW (ref 15–41)
Albumin: 3.3 g/dL — ABNORMAL LOW (ref 3.5–5.0)
Alkaline Phosphatase: 97 U/L (ref 38–126)
Bilirubin, Direct: <0.1 mg/dL (ref 0.0–0.2)
Total Bilirubin: 0.5 mg/dL (ref 0.3–1.2)
Total Protein: 6.6 g/dL (ref 6.5–8.1)

## 2021-07-07 LAB — PHOSPHORUS
Phosphorus: 6.4 mg/dL — ABNORMAL HIGH (ref 2.5–4.6)
Phosphorus: 6.9 mg/dL — ABNORMAL HIGH (ref 2.5–4.6)

## 2021-07-07 LAB — TSH: TSH: 4.185 u[IU]/mL (ref 0.350–4.500)

## 2021-07-07 LAB — ECHOCARDIOGRAM COMPLETE
AR max vel: 3.19 cm2
AV Area VTI: 3.5 cm2
AV Area mean vel: 3.51 cm2
AV Mean grad: 3.5 mmHg
AV Peak grad: 7.1 mmHg
Ao pk vel: 1.33 m/s
Area-P 1/2: 1.58 cm2
Height: 69 in
P 1/2 time: 757 msec
S' Lateral: 2.8 cm
Weight: 3386.27 oz

## 2021-07-07 LAB — HEPARIN LEVEL (UNFRACTIONATED): Heparin Unfractionated: 0.57 IU/mL (ref 0.30–0.70)

## 2021-07-07 LAB — MAGNESIUM: Magnesium: 2 mg/dL (ref 1.7–2.4)

## 2021-07-07 LAB — IRON AND TIBC
Iron: 18 ug/dL — ABNORMAL LOW (ref 45–182)
Saturation Ratios: 6 % — ABNORMAL LOW (ref 17.9–39.5)
TIBC: 279 ug/dL (ref 250–450)
UIBC: 261 ug/dL

## 2021-07-07 LAB — HEPATITIS B SURFACE ANTIGEN: Hepatitis B Surface Ag: NONREACTIVE

## 2021-07-07 LAB — SODIUM, URINE, RANDOM: Sodium, Ur: 59 mmol/L

## 2021-07-07 LAB — FERRITIN: Ferritin: 98 ng/mL (ref 24–336)

## 2021-07-07 LAB — HIV ANTIBODY (ROUTINE TESTING W REFLEX): HIV Screen 4th Generation wRfx: NONREACTIVE

## 2021-07-07 LAB — FOLATE: Folate: 16.5 ng/mL (ref >5.9)

## 2021-07-07 LAB — TROPONIN I (HIGH SENSITIVITY)
Troponin I (High Sensitivity): 28 ng/L — ABNORMAL HIGH (ref <18)
Troponin I (High Sensitivity): 31 ng/L — ABNORMAL HIGH (ref <18)

## 2021-07-07 LAB — BRAIN NATRIURETIC PEPTIDE: B Natriuretic Peptide: 199.5 pg/mL — ABNORMAL HIGH (ref 0.0–100.0)

## 2021-07-07 LAB — POC OCCULT BLOOD, ED: Fecal Occult Bld: NEGATIVE

## 2021-07-07 LAB — CREATININE, URINE, RANDOM: Creatinine, Urine: 78.85 mg/dL

## 2021-07-07 LAB — VITAMIN B12: Vitamin B-12: 299 pg/mL (ref 180–914)

## 2021-07-07 MED ORDER — HEPARIN BOLUS VIA INFUSION
4000.0000 [IU] | Freq: Once | INTRAVENOUS | Status: AC
  Administered 2021-07-07: 4000 [IU] via INTRAVENOUS
  Filled 2021-07-07: qty 4000

## 2021-07-07 MED ORDER — SODIUM CHLORIDE 0.9 % IV SOLN
1.5000 g | INTRAVENOUS | Status: DC
  Filled 2021-07-07: qty 1.5

## 2021-07-07 MED ORDER — CHLORHEXIDINE GLUCONATE CLOTH 2 % EX PADS
6.0000 | MEDICATED_PAD | Freq: Every day | CUTANEOUS | Status: DC
  Administered 2021-07-07 – 2021-07-11 (×5): 6 via TOPICAL

## 2021-07-07 MED ORDER — HEPARIN SODIUM (PORCINE) 5000 UNIT/ML IJ SOLN
5000.0000 [IU] | Freq: Three times a day (TID) | INTRAMUSCULAR | Status: DC
  Administered 2021-07-07: 5000 [IU] via SUBCUTANEOUS
  Filled 2021-07-07: qty 1

## 2021-07-07 MED ORDER — PANTOPRAZOLE SODIUM 40 MG IV SOLR
40.0000 mg | Freq: Two times a day (BID) | INTRAVENOUS | Status: DC
Start: 2021-07-07 — End: 2021-07-08
  Administered 2021-07-07 – 2021-07-08 (×3): 40 mg via INTRAVENOUS
  Filled 2021-07-07 (×3): qty 10

## 2021-07-07 MED ORDER — METOPROLOL TARTRATE 25 MG PO TABS
50.0000 mg | ORAL_TABLET | Freq: Two times a day (BID) | ORAL | Status: DC
  Administered 2021-07-07: 50 mg via ORAL
  Filled 2021-07-07: qty 2

## 2021-07-07 MED ORDER — HEPARIN (PORCINE) 25000 UT/250ML-% IV SOLN
950.0000 [IU]/h | INTRAVENOUS | Status: AC
  Administered 2021-07-07: 1250 [IU]/h via INTRAVENOUS
  Administered 2021-07-08 (×2): 1100 [IU]/h via INTRAVENOUS
  Administered 2021-07-09: 950 [IU]/h via INTRAVENOUS
  Filled 2021-07-07 (×3): qty 250

## 2021-07-07 MED ORDER — APIXABAN 5 MG PO TABS
5.0000 mg | ORAL_TABLET | Freq: Two times a day (BID) | ORAL | Status: DC
  Filled 2021-07-07: qty 1

## 2021-07-07 MED ORDER — SODIUM CHLORIDE 0.9 % IV SOLN
250.0000 mg | INTRAVENOUS | Status: DC
  Administered 2021-07-08: 250 mg via INTRAVENOUS
  Filled 2021-07-07: qty 20

## 2021-07-07 MED ORDER — FUROSEMIDE 10 MG/ML IJ SOLN
80.0000 mg | Freq: Two times a day (BID) | INTRAMUSCULAR | Status: AC
  Administered 2021-07-07 – 2021-07-08 (×2): 80 mg via INTRAVENOUS
  Filled 2021-07-07 (×2): qty 8

## 2021-07-07 MED ORDER — SEVELAMER CARBONATE 800 MG PO TABS
800.0000 mg | ORAL_TABLET | Freq: Three times a day (TID) | ORAL | Status: DC
  Administered 2021-07-07 (×2): 800 mg via ORAL
  Filled 2021-07-07 (×2): qty 1

## 2021-07-07 MED ORDER — METOPROLOL TARTRATE 12.5 MG HALF TABLET
12.5000 mg | ORAL_TABLET | Freq: Two times a day (BID) | ORAL | Status: DC
  Administered 2021-07-07 – 2021-07-10 (×4): 12.5 mg via ORAL
  Filled 2021-07-07 (×5): qty 1

## 2021-07-07 NOTE — H&P (View-Only) (Signed)
[facility] [place] ?CONSULT NOTE ? ? ?MRN : [id number] ? ?Reason for Consult: AKI needs permanent access ?Referring Physician: Dr. Augustin Coupe Nephrology ? ?History of Present Illness: 71 y/o [name] with AKI on CKD reported to the ED due to abnormal labs.  He is followed OP by Dr. Moshe Cipro. In the ED he was found to be in Afib which was treated with Cardizem and Metoprolol.   ? Past medical history includes HTN, bladder Ca stage 1 removed 2007, and depression.  He is medically managed on anti hypertensive's and daily 81 ASA.  He denies chest wall implants or previous access.  ? The admitting MD started the patient on Eliquis 5 mg BID.   ?  ? ? ?Current Facility-Administered Medications  ?Medication Dose Route Frequency Provider Last Rate Last Admin  ? 0.9 %  sodium chloride infusion  500 mL Intravenous Once Irene Shipper, MD      ? 0.9 %  sodium chloride infusion  500 mL Intravenous Continuous Irene Shipper, MD      ? apixaban Arne Cleveland) tablet 5 mg  5 mg Oral BID von Caren Griffins, RPH      ? Chlorhexidine Gluconate Cloth 2 % PADS 6 each  6 each Topical Q0600 Dwana Melena, MD      ? Derrill Memo ON 07/08/2021] ferric gluconate (FERRLECIT) 250 mg in sodium chloride 0.9 % 250 mL IVPB  250 mg Intravenous Q M,W,F-HD Dwana Melena, MD      ? furosemide (LASIX) injection 80 mg  80 mg Intravenous BID Ganta, Anupa, DO      ? metoprolol tartrate (LOPRESSOR) tablet 12.5 mg  12.5 mg Oral BID Eugenie Filler, MD      ? pantoprazole (PROTONIX) injection Christopher mg  Christopher mg Intravenous Q12H Eugenie Filler, MD      ? sevelamer carbonate (RENVELA) tablet 800 mg  800 mg Oral TID WC Dwana Melena, MD      ? ?Current Outpatient Medications  ?Medication Sig Dispense Refill  ? ALPRAZolam (XANAX) 0.5 MG tablet Take 1 tablet (0.5 mg total) by mouth 2 (two) times daily as needed for anxiety. 30 tablet 0  ? amLODipine (NORVASC) 5 MG tablet TAKE 1 TABLET(5 MG) BY MOUTH DAILY 90 tablet 3  ? ASPIRIN 81 PO Take by mouth.    ? FARXIGA  10 MG TABS tablet Take by mouth.    ? furosemide (LASIX) Christopher MG tablet Take Christopher mg by mouth daily.    ? lisinopril (ZESTRIL) 10 MG tablet TAKE 1 TABLET BY MOUTH DAILY 90 tablet 3  ? Multiple Vitamin (MULTIVITAMIN) tablet Take 1 tablet by mouth daily.    ? Omega-3 Fatty Acids (FISH OIL PO) Take 500 mg by mouth.    ? pantoprazole (PROTONIX) Christopher MG tablet TAKE 1 TABLET(Christopher MG) BY MOUTH DAILY 30 tablet 3  ? PARoxetine (PAXIL) 10 MG tablet Take 1 tablet (10 mg total) by mouth daily. 90 tablet 1  ? ? ?Pt meds include: ?Statin :No ?Betablocker: Yes ?ASA: Yes ?Other anticoagulants/antiplatelets: Eliquis ? ?Past Medical History:  ?Diagnosis Date  ? Anxiety   ? Cancer [facility])   ? bladder  cancer- stage 1- removed 2007  ? Chronic kidney disease   ? CKD   ? Depression   ? Hypertension   ? ? ?Past Surgical History:  ?Procedure Laterality Date  ? BLADDER SURGERY    ? COLONOSCOPY    ? JOINT REPLACEMENT  2016  ? knee  right   ? POLYPECTOMY    ? ? ?Social History ?Social History  ? ?Tobacco Use  ? Smoking status: Former  ?  Types: Cigarettes  ?  Quit date: 03/22/1972  ?  Years since quitting: 49.3  ? Smokeless tobacco: Never  ?Substance Use Topics  ? Alcohol use: Yes  ?  Alcohol/week: 0.0 standard drinks  ?  Comment: rare  ? Drug use: No  ? ? ?Family History ?Family History  ?Problem Relation Age of Onset  ? Cancer Mother   ? Cancer Father   ? Hypertension Father   ? Heart disease Father   ? Stomach cancer Father   ? Cancer Sister   ? Diabetes Sister   ? Heart disease Sister   ? Hypertension Sister   ? Mental illness Brother   ? Colon cancer Neg Hx   ? Colon polyps Neg Hx   ? Esophageal cancer Neg Hx   ? Rectal cancer Neg Hx   ? ? ?No Known Allergies ? ? ?REVIEW OF SYSTEMS ? ?General: '[ ]'$  Weight loss, '[ ]'$  Fever, '[ ]'$  chills ?Neurologic: '[ ]'$  Dizziness, '[ ]'$  Blackouts, '[ ]'$  Seizure ?'[ ]'$  Stroke, '[ ]'$  "Mini stroke", '[ ]'$  Slurred speech, '[ ]'$  Temporary blindness; '[ ]'$  weakness in arms or legs, '[ ]'$  Hoarseness '[ ]'$  Dysphagia ?Cardiac: '[ ]'$  Chest  pain/pressure, '[ ]'$  Shortness of breath at rest '[ ]'$  Shortness of breath with exertion, '[ ]'$  Atrial fibrillation or irregular heartbeat  ?Vascular: '[ ]'$  Pain in legs with walking, '[ ]'$  Pain in legs at rest, '[ ]'$  Pain in legs at night, ? '[ ]'$  Non-healing ulcer, '[ ]'$  Blood clot in vein/DVT,   ?Pulmonary: '[ ]'$  Home oxygen, '[ ]'$  Productive cough, '[ ]'$  Coughing up blood, '[ ]'$  Asthma, ? '[ ]'$  Wheezing '[ ]'$  COPD ?Musculoskeletal:  '[ ]'$  Arthritis, '[ ]'$  Low back pain, '[ ]'$  Joint pain ?Hematologic: '[ ]'$  Easy Bruising, '[ ]'$  Anemia; '[ ]'$  Hepatitis ?Gastrointestinal: '[ ]'$  Blood in stool, '[ ]'$  Gastroesophageal Reflux/heartburn, ?Urinary: [ x] chronic Kidney disease, '[ ]'$  on HD - '[ ]'$  MWF or '[ ]'$  TTHS, '[ ]'$  Burning with urination, '[ ]'$  Difficulty urinating ?Skin: '[ ]'$  Rashes, '[ ]'$  Wounds ?Psychological: '[ ]'$  Anxiety, '[ ]'$  Depression ? ?Physical Examination ?Vitals:  ? 07/07/21 0745 07/07/21 0800 07/07/21 0815 07/07/21 0830  ?BP: (!) 129/96 125/90 109/82 123/86  ?Pulse: 69 89 83 76  ?Resp: '16 16 20 17  '$ ?Temp:      ?TempSrc:      ?SpO2: 95% 99% 99% 96%  ?Weight:      ?Height:      ? ?Body mass index is 31.25 kg/m?. ? ?General:  WDWN in NAD ?Gait: Normal ?HENT: WNL ?Eyes: Pupils equal ?Pulmonary: normal non-labored breathing , without Rales, rhonchi,  wheezing ?Cardiac: Afib  without  Murmurs, rubs or gallops; ?No carotid bruits ?Abdomen: soft, NT, no masses ?Skin: no rashes, ulcers noted;  no Gangrene , no cellulitis; no open wounds;  ? ?Vascular Exam/Pulses:radial pulses B ? ? ?Musculoskeletal: no muscle wasting or atrophy; left > right LE edema  ?Neurologic: A&O X 3; Appropriate Affect ;  ?SENSATION: normal; ?MOTOR FUNCTION: 5/5 Symmetric ?Speech is fluent/normal ? ? ?Significant Diagnostic Studies: ?CBC ?Lab Results  ?Component Value Date  ? WBC 6.8 07/06/2021  ? HGB 10.5 (L) 07/06/2021  ? HCT 33.0 (L) 07/06/2021  ? MCV 87.8 07/06/2021  ? PLT 333 07/06/2021  ? ? ?BMET ?   ?Component  Value Date/Time  ? NA 140 07/06/2021 1600  ? NA 138 10/11/2018 1037  ? K 4.6  07/06/2021 1600  ? CL 106 07/06/2021 1600  ? CO2 23 07/06/2021 1600  ? GLUCOSE 96 07/06/2021 1600  ? BUN 69 (H) 07/06/2021 1600  ? BUN 32 (H) 10/11/2018 1037  ? CREATININE 7.31 (H) 07/07/2021 0354  ? CREATININE 1.84 (H) 06/25/2015 1756  ? CALCIUM 9.1 07/06/2021 1600  ? GFRNONAA 7 (L) 07/07/2021 0354  ? GFRNONAA 48 (L) 05/15/2014 0943  ? GFRAA 31 (L) 10/11/2018 1037  ? GFRAA 56 (L) 05/15/2014 0943  ? ?Estimated Creatinine Clearance: 10.7 mL/min (A) (by C-G formula based on SCr of 7.31 mg/dL (H)). ? ?COAG ?No results found for: INR, PROTIME ? ? ?Non-Invasive Vascular Imaging:  ?Vein mapping pending ?B LE DVT study is negative for DVT ? ?ASSESSMENT/PLAN:  ?AKI on CKD in need of permanent access ? ?I have messaged the primary MD to ask if we can place the patient on Heparin for new Afib instead of starting Eliquis to prepare for surgery and permanent access in the near future.  ? ?He is left handed so pending duplex we will plan access in the right UE AV fistula verse AV graft.  Plan for surgery tomorrow.  NPO past MN.   ? ?The admitting MD agrees to hold Eliquis and start Heparin until post op tomorrow.   ? ? ?Roxy Horseman ?07/07/2021 ?11:36 AM ? ?I agree with the above.  I have seen and evaluated the patient.  Briefly this is a 71 year old gentleman who is left-handed, and request has been made for dialysis access.  I have discussed with the patient proceeding with right-sided fistula creation and tunneled dialysis catheter.  This will be performed tomorrow. ? ?He has a new diagnosis of atrial fibrillation.  Eliquis should not be started, and IV heparin should be initiated.  He will be n.p.o. after midnight for procedure tomorrow.  Risks and benefits were discussed with the patient he wishes to proceed. ? ?Vein mapping as detailed below. ? ?Right Cephalic   Diameter (cm)Depth (cm)Findings   ?+-----------------+-------------+----------+---------+  ?Shoulder             0.44        0.98               ?+-----------------+-------------+----------+---------+  ?Prox upper arm       0.42        0.73              ?+-----------------+-------------+----------+---------+  ?Mid upper arm        0.33        0.37              ?+-------

## 2021-07-07 NOTE — Progress Notes (Signed)
Arrived from ED.   ?Ambulatory-transferred w/o difficulty from stretcher to bed.  ?A&Ox3.  CHG completed.  CCMD notified.  Vital signs per protocol. ?

## 2021-07-07 NOTE — Consult Note (Addendum)
[facility] [place] ?CONSULT NOTE ? ? ?MRN : [id number] ? ?Reason for Consult: AKI needs permanent access ?Referring Physician: Dr. Augustin Coupe Nephrology ? ?History of Present Illness: 71 y/o male with AKI on CKD reported to the ED due to abnormal labs.  He is followed OP by Dr. Moshe Cipro. In the ED he was found to be in Afib which was treated with Cardizem and Metoprolol.   ? Past medical history includes HTN, bladder Ca stage 1 removed 2007, and depression.  He is medically managed on anti hypertensive's and daily 81 ASA.  He denies chest wall implants or previous access.  ? The admitting MD started the patient on Eliquis 5 mg BID.   ?  ? ? ?Current Facility-Administered Medications  ?Medication Dose Route Frequency Provider Last Rate Last Admin  ? 0.9 %  sodium chloride infusion  500 mL Intravenous Once Irene Shipper, MD      ? 0.9 %  sodium chloride infusion  500 mL Intravenous Continuous Irene Shipper, MD      ? apixaban Arne Cleveland) tablet 5 mg  5 mg Oral BID von Caren Griffins, RPH      ? Chlorhexidine Gluconate Cloth 2 % PADS 6 each  6 each Topical Q0600 Dwana Melena, MD      ? Derrill Memo ON 07/08/2021] ferric gluconate (FERRLECIT) 250 mg in sodium chloride 0.9 % 250 mL IVPB  250 mg Intravenous Q M,W,F-HD Dwana Melena, MD      ? furosemide (LASIX) injection 80 mg  80 mg Intravenous BID Ganta, Anupa, DO      ? metoprolol tartrate (LOPRESSOR) tablet 12.5 mg  12.5 mg Oral BID Eugenie Filler, MD      ? pantoprazole (PROTONIX) injection 40 mg  40 mg Intravenous Q12H Eugenie Filler, MD      ? sevelamer carbonate (RENVELA) tablet 800 mg  800 mg Oral TID WC Dwana Melena, MD      ? ?Current Outpatient Medications  ?Medication Sig Dispense Refill  ? ALPRAZolam (XANAX) 0.5 MG tablet Take 1 tablet (0.5 mg total) by mouth 2 (two) times daily as needed for anxiety. 30 tablet 0  ? amLODipine (NORVASC) 5 MG tablet TAKE 1 TABLET(5 MG) BY MOUTH DAILY 90 tablet 3  ? ASPIRIN 81 PO Take by mouth.    ? FARXIGA  10 MG TABS tablet Take by mouth.    ? furosemide (LASIX) 40 MG tablet Take 40 mg by mouth daily.    ? lisinopril (ZESTRIL) 10 MG tablet TAKE 1 TABLET BY MOUTH DAILY 90 tablet 3  ? Multiple Vitamin (MULTIVITAMIN) tablet Take 1 tablet by mouth daily.    ? Omega-3 Fatty Acids (FISH OIL PO) Take 500 mg by mouth.    ? pantoprazole (PROTONIX) 40 MG tablet TAKE 1 TABLET(40 MG) BY MOUTH DAILY 30 tablet 3  ? PARoxetine (PAXIL) 10 MG tablet Take 1 tablet (10 mg total) by mouth daily. 90 tablet 1  ? ? ?Pt meds include: ?Statin :No ?Betablocker: Yes ?ASA: Yes ?Other anticoagulants/antiplatelets: Eliquis ? ?Past Medical History:  ?Diagnosis Date  ? Anxiety   ? Cancer [facility])   ? bladder  cancer- stage 1- removed 2007  ? Chronic kidney disease   ? CKD   ? Depression   ? Hypertension   ? ? ?Past Surgical History:  ?Procedure Laterality Date  ? BLADDER SURGERY    ? COLONOSCOPY    ? JOINT REPLACEMENT  2016  ? knee  right   ? POLYPECTOMY    ? ? ?Social History ?Social History  ? ?Tobacco Use  ? Smoking status: Former  ?  Types: Cigarettes  ?  Quit date: 03/22/1972  ?  Years since quitting: 49.3  ? Smokeless tobacco: Never  ?Substance Use Topics  ? Alcohol use: Yes  ?  Alcohol/week: 0.0 standard drinks  ?  Comment: rare  ? Drug use: No  ? ? ?Family History ?Family History  ?Problem Relation Age of Onset  ? Cancer Mother   ? Cancer Father   ? Hypertension Father   ? Heart disease Father   ? Stomach cancer Father   ? Cancer Sister   ? Diabetes Sister   ? Heart disease Sister   ? Hypertension Sister   ? Mental illness Brother   ? Colon cancer Neg Hx   ? Colon polyps Neg Hx   ? Esophageal cancer Neg Hx   ? Rectal cancer Neg Hx   ? ? ?No Known Allergies ? ? ?REVIEW OF SYSTEMS ? ?General: '[ ]'$  Weight loss, '[ ]'$  Fever, '[ ]'$  chills ?Neurologic: '[ ]'$  Dizziness, '[ ]'$  Blackouts, '[ ]'$  Seizure ?'[ ]'$  Stroke, '[ ]'$  "Mini stroke", '[ ]'$  Slurred speech, '[ ]'$  Temporary blindness; '[ ]'$  weakness in arms or legs, '[ ]'$  Hoarseness '[ ]'$  Dysphagia ?Cardiac: '[ ]'$  Chest  pain/pressure, '[ ]'$  Shortness of breath at rest '[ ]'$  Shortness of breath with exertion, '[ ]'$  Atrial fibrillation or irregular heartbeat  ?Vascular: '[ ]'$  Pain in legs with walking, '[ ]'$  Pain in legs at rest, '[ ]'$  Pain in legs at night, ? '[ ]'$  Non-healing ulcer, '[ ]'$  Blood clot in vein/DVT,   ?Pulmonary: '[ ]'$  Home oxygen, '[ ]'$  Productive cough, '[ ]'$  Coughing up blood, '[ ]'$  Asthma, ? '[ ]'$  Wheezing '[ ]'$  COPD ?Musculoskeletal:  '[ ]'$  Arthritis, '[ ]'$  Low back pain, '[ ]'$  Joint pain ?Hematologic: '[ ]'$  Easy Bruising, '[ ]'$  Anemia; '[ ]'$  Hepatitis ?Gastrointestinal: '[ ]'$  Blood in stool, '[ ]'$  Gastroesophageal Reflux/heartburn, ?Urinary: [ x] chronic Kidney disease, '[ ]'$  on HD - '[ ]'$  MWF or '[ ]'$  TTHS, '[ ]'$  Burning with urination, '[ ]'$  Difficulty urinating ?Skin: '[ ]'$  Rashes, '[ ]'$  Wounds ?Psychological: '[ ]'$  Anxiety, '[ ]'$  Depression ? ?Physical Examination ?Vitals:  ? 07/07/21 0745 07/07/21 0800 07/07/21 0815 07/07/21 0830  ?BP: (!) 129/96 125/90 109/82 123/86  ?Pulse: 69 89 83 76  ?Resp: '16 16 20 17  '$ ?Temp:      ?TempSrc:      ?SpO2: 95% 99% 99% 96%  ?Weight:      ?Height:      ? ?Body mass index is 31.25 kg/m?. ? ?General:  WDWN in NAD ?Gait: Normal ?HENT: WNL ?Eyes: Pupils equal ?Pulmonary: normal non-labored breathing , without Rales, rhonchi,  wheezing ?Cardiac: Afib  without  Murmurs, rubs or gallops; ?No carotid bruits ?Abdomen: soft, NT, no masses ?Skin: no rashes, ulcers noted;  no Gangrene , no cellulitis; no open wounds;  ? ?Vascular Exam/Pulses:radial pulses B ? ? ?Musculoskeletal: no muscle wasting or atrophy; left > right LE edema  ?Neurologic: A&O X 3; Appropriate Affect ;  ?SENSATION: normal; ?MOTOR FUNCTION: 5/5 Symmetric ?Speech is fluent/normal ? ? ?Significant Diagnostic Studies: ?CBC ?Lab Results  ?Component Value Date  ? WBC 6.8 07/06/2021  ? HGB 10.5 (L) 07/06/2021  ? HCT 33.0 (L) 07/06/2021  ? MCV 87.8 07/06/2021  ? PLT 333 07/06/2021  ? ? ?BMET ?   ?Component  Value Date/Time  ? NA 140 07/06/2021 1600  ? NA 138 10/11/2018 1037  ? K 4.6  07/06/2021 1600  ? CL 106 07/06/2021 1600  ? CO2 23 07/06/2021 1600  ? GLUCOSE 96 07/06/2021 1600  ? BUN 69 (H) 07/06/2021 1600  ? BUN 32 (H) 10/11/2018 1037  ? CREATININE 7.31 (H) 07/07/2021 0354  ? CREATININE 1.84 (H) 06/25/2015 1756  ? CALCIUM 9.1 07/06/2021 1600  ? GFRNONAA 7 (L) 07/07/2021 0354  ? GFRNONAA 48 (L) 05/15/2014 0943  ? GFRAA 31 (L) 10/11/2018 1037  ? GFRAA 56 (L) 05/15/2014 0943  ? ?Estimated Creatinine Clearance: 10.7 mL/min (A) (by C-G formula based on SCr of 7.31 mg/dL (H)). ? ?COAG ?No results found for: INR, PROTIME ? ? ?Non-Invasive Vascular Imaging:  ?Vein mapping pending ?B LE DVT study is negative for DVT ? ?ASSESSMENT/PLAN:  ?AKI on CKD in need of permanent access ? ?I have messaged the primary MD to ask if we can place the patient on Heparin for new Afib instead of starting Eliquis to prepare for surgery and permanent access in the near future.  ? ?He is left handed so pending duplex we will plan access in the right UE AV fistula verse AV graft.  Plan for surgery tomorrow.  NPO past MN.   ? ?The admitting MD agrees to hold Eliquis and start Heparin until post op tomorrow.   ? ? ?Roxy Horseman ?07/07/2021 ?11:36 AM ? ?I agree with the above.  I have seen and evaluated the patient.  Briefly this is a 71 year old gentleman who is left-handed, and request has been made for dialysis access.  I have discussed with the patient proceeding with right-sided fistula creation and tunneled dialysis catheter.  This will be performed tomorrow. ? ?He has a new diagnosis of atrial fibrillation.  Eliquis should not be started, and IV heparin should be initiated.  He will be n.p.o. after midnight for procedure tomorrow.  Risks and benefits were discussed with the patient he wishes to proceed. ? ?Vein mapping as detailed below. ? ?Right Cephalic   Diameter (cm)Depth (cm)Findings   ?+-----------------+-------------+----------+---------+  ?Shoulder             0.44        0.98               ?+-----------------+-------------+----------+---------+  ?Prox upper arm       0.42        0.73              ?+-----------------+-------------+----------+---------+  ?Mid upper arm        0.33        0.37              ?+-------

## 2021-07-07 NOTE — Consult Note (Addendum)
Reason for Consult: Acute on chronic CKD stage 4 Referring Physician: Dr. 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 Per chart review, baseline appears to be around 2.4-2.8. Cr 2.75 in July 2022. Follows up with Dr. Kathrene Bongo outpatient and has had progressive worsening of renal function. Cr was already 5.57 on 06/02/21 with small kidneys noted on u/s with a bland sediment on urine microscopy. Serologies and biopsy highly unlikely to be of benefit. Renal US notable for no hydronephrosis in the right kidney while left kidney unable to be visualized with 5.5 cm cyst in the upper pole of the right kidney. Prior renal US in June 2021 showed potential atrophy with smaller cysts that were less concerning. Ultrasound does not appear to represent obstruction. AKI likely secondary to prerenal etiology given hypertension with possible intrinsic component given presence of cysts although less likely given simple cystic structures. UA demonstrated proteinuria of 100. Unclear as to exact etiology, per home medications, patient appears to be taking lasix for the last 3 months.  History of malignancy also a considering factor but not chemotherapy taken to contribute to nephrotoxicity. No bleeding source -Renally dose medications given GFR <15. -Avoid nephrotoxic agents.  -Plan to start hemodialysis given progressively worsening renal function noted over time followed by Dr. Kathrene Bongo at [facility] with cr already 5.57 06/02/21.  -Lasix 80 mg bid to determine response; sometimes Lasix can be used to help with volume status between dialysis treatments if pt has a good response. -Compression stockings  - CLIP - Will consult [facility] for permanent access placement as well; restrict right arm (he is left hand dominant) + vein mapping. 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 Normocytic anemia: Hgb 10.5 with MCV 87.8 on  admission, etiology likely secondary to anemia of chronic disease.  Iron deficient -> will load with Ferrlecit when he is on dialysis + likely will need ESA during this hospitalization but will monitor closely. History of transitional cell carcinoma of bladder: Noted in 2007, s/p surgery without neoadjuvant chemotherapy.  Atrial fibrillation with RVR: new onset, Most recent EKG notable for atrial fibrillation with HR >120 and noted to be in RVR. Likely input from cardiology, continue diltiazem and metoprolol. Monitor HR and continue cardiac telemetry.  Recent History of Hyperkalemia: K 4.6 most recently. Will continue to monitor, holding lisinopril.   Hypertension: Initially hypertensive on admission, improving. Hold home lisinopril given worsening renal function.    HPI: Warfield Ficke is an 71 y.o. male presents with worsening abdominal pain and noted by his PCP to come to the ED as his recent blood work was worsening. He endorses abdominal pain that has been ongoing, it is associated with a burning sensation and reflux. Today he denies chest pain, dyspnea, hematuria, dysuria and other symptoms. He states that he has been urinating like normal without any difficulty recently. He was started a lasix a few months ago by the Dr. Kathrene Bongo  who mentioned that she wants him to go on dialysis, he maintains routine follow up with nephrology outpatient. He has noticed lower extremity swelling for the past 2-3 weeks.    ROS Pertinent items are noted in HPI.  Chemistry and CBC: Creat  Date/Time Value Ref Range Status  06/25/2015 05:56 PM 1.84 (H) 0.70 - 1.25 mg/dL Final  16/12/9602 54:09 PM 1.96 (H) 0.70 - 1.25 mg/dL Final  81/19/1478 29:56 AM 1.51 (H) 0.50 - 1.35 mg/dL Final  02/06/2013 01:03 PM 1.32 0.50 - 1.35 mg/dL Final  62/13/0865 78:46 AM 1.52 (H) 0.50 - 1.35 mg/dL Final   Creatinine, Ser  Date/Time Value Ref Range Status  07/07/2021 03:54 AM 7.31 (H) 0.61 - 1.24 mg/dL Final   96/29/5284 13:24 PM 7.25 (H) 0.61 - 1.24 mg/dL Final  40/12/2723 36:64 AM 7.01 (HH) 0.40 - 1.50 mg/dL Final  40/34/7425 95:63 AM 2.75 (H) 0.40 - 1.50 mg/dL Final  87/56/4332 95:18 AM 2.38 (H) 0.76 - 1.27 mg/dL Final  84/16/6063 01:60 PM 1.6 (H) 0.4 - 1.5 mg/dL Final   Recent Labs  Lab 07/03/21 1123 07/06/21 1600 07/07/21 0212 07/07/21 0354  NA 140 140  --   --   K 5.7* 4.6  --   --   CL 105 106  --   --   CO2 26 23  --   --   GLUCOSE 85 96  --   --   BUN 69* 69*  --   --   CREATININE 7.01* 7.25*  --  7.31*  CALCIUM 9.3 9.1  --   --   PHOS  --   --  6.4*  --    Recent Labs  Lab 07/06/21 1600  WBC 6.8  HGB 10.5*  HCT 33.0*  MCV 87.8  PLT 333   Liver Function Tests: Recent Labs  Lab 07/07/21 0212  AST 12*  ALT 8  ALKPHOS 97  BILITOT 0.5  PROT 6.6  ALBUMIN 3.3*   No results for input(s): LIPASE, AMYLASE in the last 168 hours. No results for input(s): AMMONIA in the last 168 hours. Cardiac Enzymes: No results for input(s): CKTOTAL, CKMB, CKMBINDEX, TROPONINI in the last 168 hours. Iron Studies:  Recent Labs    07/07/21 0212  IRON 18*  TIBC 279  FERRITIN 98   PT/INR: @LABRCNTIP (inr:5)  Xrays/Other Studies: ) Results for orders placed or performed during the hospital encounter of 07/06/21 (from the past 48 hour(s))  Urinalysis, Routine w reflex microscopic Urine, Clean Catch     Status: Abnormal   Collection Time: 07/06/21  3:13 PM  Result Value Ref Range   Color, Urine YELLOW YELLOW   APPearance CLEAR CLEAR   Specific Gravity, Urine 1.014 1.005 - 1.030   pH 5.0 5.0 - 8.0   Glucose, UA NEGATIVE NEGATIVE mg/dL   Hgb urine dipstick NEGATIVE NEGATIVE   Bilirubin Urine NEGATIVE NEGATIVE   Ketones, ur NEGATIVE NEGATIVE mg/dL   Protein, ur 109 (A) NEGATIVE mg/dL   Nitrite NEGATIVE NEGATIVE   Leukocytes,Ua NEGATIVE NEGATIVE   RBC / HPF 0-5 0 - 5 RBC/hpf   WBC, UA 0-5 0 - 5 WBC/hpf   Bacteria, UA NONE SEEN NONE SEEN   Mucus PRESENT     Comment:  Performed at [facility], [address], [place], [place] [address]  Basic metabolic panel     Status: Abnormal   Collection Time: 07/06/21  4:00 PM  Result Value Ref Range   Sodium 140 135 - 145 mmol/L   Potassium 4.6 3.5 - 5.1 mmol/L   Chloride 106 98 - 111 mmol/L   CO2 23 22 - 32 mmol/L   Glucose, Bld 96 70 - 99 mg/dL    Comment: Glucose reference range applies only to samples taken after fasting for at least 8 hours.   BUN 69 (H) 8 - 23 mg/dL   Creatinine, Ser 7.32 (H) 0.61 - 1.24 mg/dL   Calcium 9.1 8.9 - 20.2 mg/dL   GFR, Estimated 8 (  L) >60 mL/min    Comment: (NOTE) Calculated using the CKD-EPI Creatinine Equation (2021)    Anion gap 11 5 - 15    Comment: Performed at [facility], [address], [place], [place] [address]  CBC     Status: Abnormal   Collection Time: 07/06/21  4:00 PM  Result Value Ref Range   WBC 6.8 4.0 - 10.5 K/uL   RBC 3.76 (L) 4.22 - 5.81 MIL/uL   Hemoglobin 10.5 (L) 13.0 - 17.0 g/dL   HCT 13.2 (L) 44.0 - 10.2 %   MCV 87.8 80.0 - 100.0 fL   MCH 27.9 26.0 - 34.0 pg   MCHC 31.8 30.0 - 36.0 g/dL   RDW 72.5 36.6 - 44.0 %   Platelets 333 150 - 400 K/uL   nRBC 0.0 0.0 - 0.2 %    Comment: Performed at [facility], [address] [place], [place] [address]  Brain natriuretic peptide     Status: Abnormal   Collection Time: 07/06/21  4:00 PM  Result Value Ref Range   B Natriuretic Peptide 199.5 (H) 0.0 - 100.0 pg/mL    Comment: Performed at [facility], [address], [place], [place] [address]  Magnesium     Status: None   Collection Time: 07/07/21  2:12 AM  Result Value Ref Range   Magnesium 2.0 1.7 - 2.4 mg/dL    Comment: Performed at [facility], [address], [place], [place] [address]  Phosphorus     Status: Abnormal   Collection Time: 07/07/21  2:12 AM  Result Value Ref Range   Phosphorus 6.4 (H) 2.5 - 4.6 mg/dL    Comment: Performed at [facility], [address], [place], [place] [address]   Hepatic function panel     Status: Abnormal   Collection Time: 07/07/21  2:12 AM  Result Value Ref Range   Total Protein 6.6 6.5 - 8.1 g/dL   Albumin 3.3 (L) 3.5 - 5.0 g/dL   AST 12 (L) 15 - 41 U/L   ALT 8 0 - 44 U/L   Alkaline Phosphatase 97 38 - 126 U/L   Total Bilirubin 0.5 0.3 - 1.2 mg/dL   Bilirubin, Direct <1.8 0.0 - 0.2 mg/dL   Indirect Bilirubin NOT CALCULATED 0.3 - 0.9 mg/dL    Comment: Performed at [facility], [address], [place], [place] [address]  Troponin I (High Sensitivity)     Status: Abnormal   Collection Time: 07/07/21  2:12 AM  Result Value Ref Range   Troponin I (High Sensitivity) 28 (H) <18 ng/L    Comment: (NOTE) Elevated high sensitivity troponin I (hsTnI) values and significant  changes across serial measurements may suggest ACS but many other  chronic and acute conditions are known to elevate hsTnI results.  Refer to the "Links" section for chest pain algorithms and additional  guidance. Performed at [facility], [address], [place], [place] [address]   Vitamin B12     Status: None   Collection Time: 07/07/21  2:12 AM  Result Value Ref Range   Vitamin B-12 299 180 - 914 pg/mL    Comment: (NOTE) This assay is not validated for testing neonatal or myeloproliferative syndrome specimens for Vitamin B12 levels. Performed at [facility], [address], [place], [place] [address]   Iron and TIBC     Status: Abnormal   Collection Time: 07/07/21  2:12  AM  Result Value Ref Range   Iron 18 (L) 45 - 182 ug/dL   TIBC 409 811 - 914 ug/dL   Saturation Ratios 6 (L) 17.9 - 39.5 %   UIBC 261 ug/dL    Comment: Performed at [facility], [address], [place], [place] [address]  Ferritin     Status: None   Collection Time: 07/07/21  2:12 AM  Result Value Ref Range   Ferritin 98 24 - 336 ng/mL    Comment: Performed at [facility], [address], [place], [place] [address]  Reticulocytes     Status: Abnormal    Collection Time: 07/07/21  2:12 AM  Result Value Ref Range   Retic Ct Pct 1.2 0.4 - 3.1 %   RBC. 3.94 (L) 4.22 - 5.81 MIL/uL   Retic Count, Absolute 47.7 19.0 - 186.0 K/uL   Immature Retic Fract 9.8 2.3 - 15.9 %    Comment: Performed at [facility], [address] [place], [place] [address]  Folate     Status: None   Collection Time: 07/07/21  2:12 AM  Result Value Ref Range   Folate 16.5 >5.9 ng/mL    Comment: Performed at [facility], [address], [place], [place] [address]  TSH     Status: None   Collection Time: 07/07/21  2:12 AM  Result Value Ref Range   TSH 4.185 0.350 - 4.500 uIU/mL    Comment: Performed by a 3rd Generation assay with a functional sensitivity of <=0.01 uIU/mL. Performed at [facility], [address], [place], [place] [address]   Troponin I (High Sensitivity)     Status: Abnormal   Collection Time: 07/07/21  3:54 AM  Result Value Ref Range   Troponin I (High Sensitivity) 31 (H) <18 ng/L    Comment: (NOTE) Elevated high sensitivity troponin I (hsTnI) values and significant  changes across serial measurements may suggest ACS but many other  chronic and acute conditions are known to elevate hsTnI results.  Refer to the "Links" section for chest pain algorithms and additional  guidance. Performed at [facility], [address] [place], [place] [address]   Creatinine, serum     Status: Abnormal   Collection Time: 07/07/21  3:54 AM  Result Value Ref Range   Creatinine, Ser 7.31 (H) 0.61 - 1.24 mg/dL   GFR, Estimated 7 (L) >60 mL/min    Comment: (NOTE) Calculated using the CKD-EPI Creatinine Equation (2021) Performed at [facility], [address], [place], [place] [address]    US RENAL  Result Date: 07/07/2021 CLINICAL DATA:  Renal failure. EXAM: RENAL / URINARY TRACT ULTRASOUND COMPLETE COMPARISON:  Abdominal ultrasound 06/05/2021 FINDINGS: Right Kidney: Renal measurements: 8.7 x 4.1 x 3.7 cm = volume: 70 mL. 5.5  cm cystic lesion identified upper pole right kidney. 3 cm cystic lesion identified towards the interpolar region. No hydronephrosis. Left Kidney: Left kidney could not be visualized. Bladder: Appears normal for degree of bladder distention. Other: None. IMPRESSION: 1. No hydronephrosis in the right kidney. Left kidney could not be visualized. 2. Similar appearance 5.5 cm simple appearing cyst in the upper pole right kidney with smaller interpolar cystic lesion evident. Electronically Signed   By: Kennith Center M.D.   On: 07/07/2021 07:08   DG Chest Portable 1 View  Result Date: 07/06/2021 CLINICAL DATA:  Pulmonary edema EXAM: PORTABLE CHEST 1 VIEW COMPARISON:  08/03/2008 FINDINGS: Mild  bibasilar atelectasis. Lungs are otherwise clear. No pneumothorax or pleural effusion. Cardiac size within normal limits. Pulmonary vascularity is normal. No acute bone abnormality. IMPRESSION: No active disease. Electronically Signed   By: Helyn Numbers M.D.   On: 07/06/2021 23:26    PMH:   Past Medical History:  Diagnosis Date   Anxiety    Cancer (HCC)    bladder  cancer- stage 1- removed 2007   Chronic kidney disease    CKD    Depression    Hypertension     PSH:   Past Surgical History:  Procedure Laterality Date   BLADDER SURGERY     COLONOSCOPY     JOINT REPLACEMENT  2016   knee right    POLYPECTOMY      Allergies: No Known Allergies  Medications:   Prior to Admission medications   Medication Sig Start Date End Date Taking? Authorizing Provider  ALPRAZolam Prudy Feeler) 0.5 MG tablet Take 1 tablet (0.5 mg total) by mouth 2 (two) times daily as needed for anxiety. 09/10/17   Georgina Quint, MD  amLODipine (NORVASC) 5 MG tablet TAKE 1 TABLET(5 MG) BY MOUTH DAILY 11/15/20   Georgina Quint, MD  ASPIRIN 81 PO Take by mouth.    [provider]  FARXIGA 10 MG TABS tablet Take by mouth. 01/06/21   [provider]  furosemide (LASIX) 40 MG tablet Take 40 mg by mouth daily.  06/02/21   [provider]  lisinopril (ZESTRIL) 10 MG tablet TAKE 1 TABLET BY MOUTH DAILY 11/15/20   Georgina Quint, MD  Multiple Vitamin (MULTIVITAMIN) tablet Take 1 tablet by mouth daily.    [provider]  Omega-3 Fatty Acids (FISH OIL PO) Take 500 mg by mouth.    [provider]  pantoprazole (PROTONIX) 40 MG tablet TAKE 1 TABLET(40 MG) BY MOUTH DAILY 06/24/21   Georgina Quint, MD  PARoxetine (PAXIL) 10 MG tablet Take 1 tablet (10 mg total) by mouth daily. 10/08/20 07/03/21  Georgina Quint, MD    Discontinued Meds:  There are no discontinued medications.  Social History:  reports that he quit smoking about 49 years ago. His smoking use included cigarettes. He has never used smokeless tobacco. He reports current alcohol use. He reports that he does not use drugs.  Family History:   Family History  Problem Relation Age of Onset   Cancer Mother    Cancer Father    Hypertension Father    Heart disease Father    Stomach cancer Father    Cancer Sister    Diabetes Sister    Heart disease Sister    Hypertension Sister    Mental illness Brother    Colon cancer Neg Hx    Colon polyps Neg Hx    Esophageal cancer Neg Hx    Rectal cancer Neg Hx     Blood pressure (!) 129/96, pulse 69, temperature 98.3 F (36.8 C), temperature source Oral, resp. rate 16, height 5\' 9"  (1.753 m), weight 96 kg, SpO2 95 %. General appearance: alert, cooperative, and no distress Head: Normocephalic, without obvious abnormality, atraumatic Resp: very faint rales noted diffusely  Chest wall: no tenderness Cardio: irregularly irregular rhythm, regular rate, no murmurs or gallops auscultated  GI: soft, non-tender; bowel sounds normal; no masses,  no organomegaly Extremities: 2+ pitting edema noted along LE bilaterally along with presence of pedal edema bilaterally, radial and distal pulses strong and equal bilaterally        Anupa  Robyne Peers, DO PGY-2, [facility]  [facility]  07/07/2021, 8:12 AM

## 2021-07-07 NOTE — Progress Notes (Signed)
Upper extremity vein mapping has been completed.  ? ?Preliminary results in CV Proc.  ? ?Christopher [name] ?07/07/2021 2:17 PM    ?

## 2021-07-07 NOTE — ED Notes (Signed)
Breakfast order placed ?

## 2021-07-07 NOTE — Progress Notes (Signed)
Lower extremity venous has been completed.  ? ?Preliminary results in CV Proc.  ? ?Christopher [name] ?07/07/2021 11:27 AM    ?

## 2021-07-07 NOTE — Progress Notes (Signed)
Echocardiogram ?2D Echocardiogram has been performed. ? ?Christopher [name] ?07/07/2021, 3:46 PM ?

## 2021-07-07 NOTE — Progress Notes (Signed)
I have seen and assessed patient and I agree with Dr. Moise Boring assessment and plan.  Patient is a pleasant 71 year old gentleman, history of chronic kidney disease stage IV, upper abdominal discomfort being followed by GI in the outpatient setting.  Patient was being prepped for upper endoscopy with lab work showing worsening renal function and hyperkalemia.  Patient subsequently sent to the ED.  Patient in the ED noted to have new onset A-fib received a dose of Cardizem heart rate improved however still remains in A-fib.  Currently on metoprolol 50 mg p.o. twice daily.  CHA2DS2-VASc 2 score >/= 2.  2D echo pending.  Placed on Eliquis twice daily.  Will need outpatient follow-up with cardiology postdischarge.  Nephrology consulted for further evaluation.  GI following. ? ?No charge. ?

## 2021-07-07 NOTE — H&P (Signed)
?History and Physical  ? ? ?Christopher [name] GXQ:[id number] DOB: 1951-01-23 DOA: 07/06/2021 ? ?PCP: Horald Pollen, MD  ?Patient coming from: Home. ? ?Chief Complaint: Abnormal labs. ? ?HPI: Christopher [name] is a 71 y.o. male with history of hypertension, chronic kidney disease stage IV last creatinine in July 2022 was 2.7 in our system, abdominal pain presents to the ER after patient was found to have abnormal labs concerning for worsening renal function and hyperkalemia.  Patient has been having abdominal discomfort for the last 6 months and has had abdominal sonogram last month which did show gallbladder stones but no cholecystitis had followed up with gastroenterologist Dr.  Henrene Pastor is planning to do EGD but due to his worsening renal function was deferred.  Patient states over the last few weeks his appetite has been poor and has been a persistent upper abdominal pain.  Denies any diarrhea or vomiting.  Has been having nausea.  Denies any chest pain or shortness of breath. ? ? ?Patient will last 3 months has been following with nephrologist Dr. Moshe Cipro and was placed on Lasix.  Despite which patient's lower extremity edema has been worsening. ? ?ED Course: In the ER patient is found to be in A-fib with RVR was given Cardizem bolus following which heart rate improved but again went tachycardic for which I ordered metoprolol 50 mg p.o. twice daily following which heart rate improved.  Labs show creatinine of 7.25 potassium of 4.6 LFTs showed normal AST and ALT UA shows protein of 100 with no RBC or casts.  Patient admitted for acute on chronic kidney disease probably progressive renal failure with new onset A-fib with RVR. ? ?Review of Systems: As per HPI, rest all negative. ? ? ?Past Medical History:  ?Diagnosis Date  ? Anxiety   ? Cancer [facility])   ? bladder  cancer- stage 1- removed 2007  ? Chronic kidney disease   ? CKD   ? Depression   ? Hypertension   ? ? ?Past Surgical History:  ?Procedure  Laterality Date  ? BLADDER SURGERY    ? COLONOSCOPY    ? JOINT REPLACEMENT  2016  ? knee right   ? POLYPECTOMY    ? ? ? reports that he quit smoking about 49 years ago. His smoking use included cigarettes. He has never used smokeless tobacco. He reports current alcohol use. He reports that he does not use drugs. ? ?No Known Allergies ? ?Family History  ?Problem Relation Age of Onset  ? Cancer Mother   ? Cancer Father   ? Hypertension Father   ? Heart disease Father   ? Stomach cancer Father   ? Cancer Sister   ? Diabetes Sister   ? Heart disease Sister   ? Hypertension Sister   ? Mental illness Brother   ? Colon cancer Neg Hx   ? Colon polyps Neg Hx   ? Esophageal cancer Neg Hx   ? Rectal cancer Neg Hx   ? ? ?Prior to Admission medications   ?Medication Sig Start Date End Date Taking? Authorizing Provider  ?ALPRAZolam (XANAX) 0.5 MG tablet Take 1 tablet (0.5 mg total) by mouth 2 (two) times daily as needed for anxiety. 09/10/17   Horald Pollen, MD  ?amLODipine (NORVASC) 5 MG tablet TAKE 1 TABLET(5 MG) BY MOUTH DAILY 11/15/20   Horald Pollen, MD  ?ASPIRIN 81 PO Take by mouth.    [provider]  ?FARXIGA 10 MG TABS tablet Take by mouth. 01/06/21  [provider]  ?furosemide (LASIX) 40 MG tablet Take 40 mg by mouth daily. 06/02/21   [provider]  ?lisinopril (ZESTRIL) 10 MG tablet TAKE 1 TABLET BY MOUTH DAILY 11/15/20   Horald Pollen, MD  ?Multiple Vitamin (MULTIVITAMIN) tablet Take 1 tablet by mouth daily.    [provider]  ?Omega-3 Fatty Acids (FISH OIL PO) Take 500 mg by mouth.    [provider]  ?pantoprazole (PROTONIX) 40 MG tablet TAKE 1 TABLET(40 MG) BY MOUTH DAILY 06/24/21   Horald Pollen, MD  ?PARoxetine (PAXIL) 10 MG tablet Take 1 tablet (10 mg total) by mouth daily. 10/08/20 07/03/21  Horald Pollen, MD  ? ? ?Physical Exam: ?Constitutional: Moderately built and nourished. ?Vitals:  ? 07/06/21 1546 07/06/21 1908 07/06/21 2221  07/06/21 2345  ?BP: (!) 154/91 (!) 183/93 (!) 127/100 (!) 157/98  ?Pulse: 73 70 (!) 152 83  ?Resp: '18 14 18 18  '$ ?Temp: 98.3 ?F (36.8 ?C)     ?TempSrc: Oral     ?SpO2: 100% 99% 100% 99%  ? ?Eyes: Anicteric no pallor. ?ENMT: No discharge from the ears eyes nose and mouth. ?Neck: No mass felt.  No neck rigidity. ?Respiratory: No rhonchi or crepitations. ?Cardiovascular: S1-S2 heard. ?Abdomen: Soft nontender bowel sound present. ?Musculoskeletal: Bilateral lower extremity edema extending up to the knees. ?Skin: No rash.  Chronic skin changes. ?Neurologic: Alert awake oriented time place and person.  Moves all extremities. ?Psychiatric: Appears normal.  Normal affect. ? ? ?Labs on Admission: I have personally reviewed following labs and imaging studies ? ?CBC: ?Recent Labs  ?Lab 07/06/21 ?1600  ?WBC 6.8  ?HGB 10.5*  ?HCT 33.0*  ?MCV 87.8  ?PLT 333  ? ?Basic Metabolic Panel: ?Recent Labs  ?Lab 07/03/21 ?1123 07/06/21 ?1600  ?NA 140 140  ?K 5.7* 4.6  ?CL 105 106  ?CO2 26 23  ?GLUCOSE 85 96  ?BUN 69* 69*  ?CREATININE 7.01* 7.25*  ?CALCIUM 9.3 9.1  ? ?GFR: ?Estimated Creatinine Clearance: 10.9 mL/min (A) (by C-G formula based on SCr of 7.25 mg/dL (H)). ?Liver Function Tests: ?No results for input(s): AST, ALT, ALKPHOS, BILITOT, PROT, ALBUMIN in the last 168 hours. ?No results for input(s): LIPASE, AMYLASE in the last 168 hours. ?No results for input(s): AMMONIA in the last 168 hours. ?Coagulation Profile: ?No results for input(s): INR, PROTIME in the last 168 hours. ?Cardiac Enzymes: ?No results for input(s): CKTOTAL, CKMB, CKMBINDEX, TROPONINI in the last 168 hours. ?BNP (last 3 results) ?No results for input(s): PROBNP in the last 8760 hours. ?HbA1C: ?No results for input(s): HGBA1C in the last 72 hours. ?CBG: ?No results for input(s): GLUCAP in the last 168 hours. ?Lipid Profile: ?No results for input(s): CHOL, HDL, LDLCALC, TRIG, CHOLHDL, LDLDIRECT in the last 72 hours. ?Thyroid Function Tests: ?No results for input(s):  TSH, T4TOTAL, FREET4, T3FREE, THYROIDAB in the last 72 hours. ?Anemia Panel: ?No results for input(s): VITAMINB12, FOLATE, FERRITIN, TIBC, IRON, RETICCTPCT in the last 72 hours. ?Urine analysis: ?   ?Component Value Date/Time  ? COLORURINE YELLOW 07/06/2021 1513  ? APPEARANCEUR CLEAR 07/06/2021 1513  ? LABSPEC 1.014 07/06/2021 1513  ? PHURINE 5.0 07/06/2021 1513  ? GLUCOSEU NEGATIVE 07/06/2021 1513  ? HGBUR NEGATIVE 07/06/2021 1513  ? BILIRUBINUR NEGATIVE 07/06/2021 1513  ? BILIRUBINUR neg 02/06/2013 1332  ? KETONESUR NEGATIVE 07/06/2021 1513  ? PROTEINUR 100 (A) 07/06/2021 1513  ? UROBILINOGEN 0.2 02/06/2013 1332  ? NITRITE NEGATIVE 07/06/2021 1513  ? LEUKOCYTESUR NEGATIVE 07/06/2021 1513  ? ?  Sepsis Labs: ?'@LABRCNTIP'$ (procalcitonin:4,lacticidven:4) ?)No results found for this or any previous visit (from the past 240 hour(s)).  ? ?Radiological Exams on Admission: ?DG Chest Portable 1 View ? ?Result Date: 07/06/2021 ?CLINICAL DATA:  Pulmonary edema EXAM: PORTABLE CHEST 1 VIEW COMPARISON:  08/03/2008 FINDINGS: Mild bibasilar atelectasis. Lungs are otherwise clear. No pneumothorax or pleural effusion. Cardiac size within normal limits. Pulmonary vascularity is normal. No acute bone abnormality. IMPRESSION: No active disease. Electronically Signed   By: Fidela Salisbury M.D.   On: 07/06/2021 23:26   ? ?EKG: Independently reviewed.  A-fib with RVR. ? ?Assessment/Plan ?Principal Problem: ?  ARF (acute renal failure) ([place]) ?Active Problems: ?  HTN (hypertension) ?  Transitional cell carcinoma of bladder 2007 ?  Atrial fibrillation with RVR ([place]) ?  ? ?Acute on chronic kidney disease stage IV likely progressive worsening renal function -we do not have any access to the recent creatinine.  Dr. Joelyn Oms nephrology has been consulted.  We will get renal ultrasound and follow further recommendations per nephrologist.  Patient likely may need dialysis.  Hold lisinopril.  Patient denies taking any NSAIDs. ?A-fib with RVR new onset.   Patient's heart rate improved with p.o. metoprolol which we will continue.  Patient's CHA2DS2-VASc score is 1.  For now I have not started patient on any anticoagulation.  Check 2D echo.  Closely monitor in

## 2021-07-07 NOTE — Progress Notes (Signed)
ANTICOAGULATION CONSULT NOTE ? ?Pharmacy Consult for apixaban ?Indication: atrial fibrillation ? ? ?Labs: ?Recent Labs  ?  07/06/21 ?1600 07/07/21 ?0354 07/07/21 ?1118  ?HGB 10.5*  --  10.0*  ?HCT 33.0*  --  30.5*  ?PLT 333  --  321  ?CREATININE 7.25* 7.31* 7.28*  ? ? ? ?Assessment: ?41 yom presenting with abdominal discomfort x 6 months, EGD deferred due to worsening renal function. SCr stable 7.31 today. Patient found to be in afib with RVR. Pharmacy consulted to start apixaban. Currently on heparin 5000 units SQ q8h for VTE prophylaxis inpatient - last dose this AM at 0521. CBC stable. LFTs WNL. No bleed issues reported. ? ?Goal of Therapy:  ?Stroke prevention ?Monitor platelets by anticoagulation protocol: Yes ?  ?Plan:  ?D/c heparin SQ ?Start apixaban '5mg'$  PO BID ?Monitor CBC, SCr, s/sx bleeding ?F/u GI plans ? ? ?Arturo Morton, PharmD, BCPS ?Clinical Pharmacist ?07/07/2021 12:46 PM ? ? ?ADDENDUM - Vascular planning on intervention 4/19. Holding apixaban (no doses given). Pharmacy consulted to start heparin drip instead. CBC stable. No bleed issues reported. ? ?Heparin dosing weight 90.7 kg ? ?Plan: ?Heparin 4000 unit bolus ?Start heparin at 1250 units/hr ?Check 8hr heparin level ?Monitor daily CBC, s/sx bleeding ?F/u anticoagulation plans post-Vascular intervention 4/19 ? ? ?Arturo Morton, PharmD, BCPS ?Please check AMION for all [place] contact numbers ?Clinical Pharmacist ?07/07/2021 12:49 PM ? ? ?

## 2021-07-07 NOTE — Progress Notes (Signed)
ANTICOAGULATION CONSULT NOTE ? ?Pharmacy Consult for apixaban ?Indication: atrial fibrillation ? ? ?Labs: ?Recent Labs  ?  07/06/21 ?1600 07/07/21 ?0354  ?HGB 10.5*  --   ?HCT 33.0*  --   ?PLT 333  --   ?CREATININE 7.25* 7.31*  ? ? ?Assessment: ?4 yom presenting with abdominal discomfort x 6 months, EGD deferred due to worsening renal function. SCr stable 7.31 today. Patient found to be in afib with RVR. Pharmacy consulted to start apixaban. Currently on heparin 5000 units SQ q8h for VTE prophylaxis inpatient - last dose this AM at 0521. Hg 10.5, plt wnl. LFTs WNL. No bleed issues reported. ? ?Goal of Therapy:  ?Stroke prevention ?Monitor platelets by anticoagulation protocol: Yes ?  ?Plan:  ?D/c heparin SQ ?Start apixaban '5mg'$  PO BID ?Monitor CBC, SCr, s/sx bleeding ?F/u GI plans ? ? ?Arturo Morton, PharmD, BCPS ?Clinical Pharmacist ?07/07/2021 10:57 AM ? ?

## 2021-07-08 ENCOUNTER — Inpatient Hospital Stay (HOSPITAL_COMMUNITY): Payer: Medicare PPO

## 2021-07-08 ENCOUNTER — Inpatient Hospital Stay (HOSPITAL_COMMUNITY): Payer: Medicare PPO | Admitting: Anesthesiology

## 2021-07-08 ENCOUNTER — Encounter (HOSPITAL_COMMUNITY)
Admission: EM | Disposition: A | Discharge status: Home / Self Care | Payer: Self-pay | Source: Home / Self Care | Attending: Internal Medicine

## 2021-07-08 ENCOUNTER — Other Ambulatory Visit: Payer: Self-pay

## 2021-07-08 ENCOUNTER — Encounter (HOSPITAL_COMMUNITY): Payer: Self-pay | Admitting: Internal Medicine

## 2021-07-08 DIAGNOSIS — N189 Chronic kidney disease, unspecified: Secondary | ICD-10-CM

## 2021-07-08 DIAGNOSIS — N186 End stage renal disease: Secondary | ICD-10-CM

## 2021-07-08 DIAGNOSIS — N179 Acute kidney failure, unspecified: Secondary | ICD-10-CM

## 2021-07-08 DIAGNOSIS — I4891 Unspecified atrial fibrillation: Secondary | ICD-10-CM | POA: Diagnosis not present

## 2021-07-08 DIAGNOSIS — C679 Malignant neoplasm of bladder, unspecified: Secondary | ICD-10-CM

## 2021-07-08 DIAGNOSIS — D631 Anemia in chronic kidney disease: Secondary | ICD-10-CM

## 2021-07-08 DIAGNOSIS — Z992 Dependence on renal dialysis: Secondary | ICD-10-CM

## 2021-07-08 DIAGNOSIS — I120 Hypertensive chronic kidney disease with stage 5 chronic kidney disease or end stage renal disease: Secondary | ICD-10-CM

## 2021-07-08 HISTORY — PX: AV FISTULA PLACEMENT: SHX1204

## 2021-07-08 HISTORY — PX: INSERTION OF DIALYSIS CATHETER: SHX1324

## 2021-07-08 LAB — CBC WITH DIFFERENTIAL/PLATELET
Abs Immature Granulocytes: 0.02 10*3/uL (ref 0.00–0.07)
Basophils Absolute: 0.1 10*3/uL (ref 0.0–0.1)
Basophils Relative: 1 %
Eosinophils Absolute: 0.4 10*3/uL (ref 0.0–0.5)
Eosinophils Relative: 6 %
HCT: 28.1 % — ABNORMAL LOW (ref 39.0–52.0)
Hemoglobin: 9.1 g/dL — ABNORMAL LOW (ref 13.0–17.0)
Immature Granulocytes: 0 %
Lymphocytes Relative: 23 %
Lymphs Abs: 1.7 10*3/uL (ref 0.7–4.0)
MCH: 28.1 pg (ref 26.0–34.0)
MCHC: 32.4 g/dL (ref 30.0–36.0)
MCV: 86.7 fL (ref 80.0–100.0)
Monocytes Absolute: 0.9 10*3/uL (ref 0.1–1.0)
Monocytes Relative: 12 %
Neutro Abs: 4.4 10*3/uL (ref 1.7–7.7)
Neutrophils Relative %: 58 %
Platelets: 272 10*3/uL (ref 150–400)
RBC: 3.24 MIL/uL — ABNORMAL LOW (ref 4.22–5.81)
RDW: 15.2 % (ref 11.5–15.5)
WBC: 7.5 10*3/uL (ref 4.0–10.5)
nRBC: 0 % (ref 0.0–0.2)

## 2021-07-08 LAB — RENAL FUNCTION PANEL
Albumin: 2.8 g/dL — ABNORMAL LOW (ref 3.5–5.0)
Anion gap: 9 (ref 5–15)
BUN: 77 mg/dL — ABNORMAL HIGH (ref 8–23)
CO2: 22 mmol/L (ref 22–32)
Calcium: 8.8 mg/dL — ABNORMAL LOW (ref 8.9–10.3)
Chloride: 107 mmol/L (ref 98–111)
Creatinine, Ser: 7.54 mg/dL — ABNORMAL HIGH (ref 0.61–1.24)
GFR, Estimated: 7 mL/min — ABNORMAL LOW (ref >60)
Glucose, Bld: 96 mg/dL (ref 70–99)
Phosphorus: 6.4 mg/dL — ABNORMAL HIGH (ref 2.5–4.6)
Potassium: 4.2 mmol/L (ref 3.5–5.1)
Sodium: 138 mmol/L (ref 135–145)

## 2021-07-08 LAB — HEPARIN LEVEL (UNFRACTIONATED): Heparin Unfractionated: 0.75 IU/mL — ABNORMAL HIGH (ref 0.30–0.70)

## 2021-07-08 SURGERY — ARTERIOVENOUS (AV) FISTULA CREATION
Anesthesia: General | Laterality: Right

## 2021-07-08 MED ORDER — LIDOCAINE 2% (20 MG/ML) 5 ML SYRINGE
INTRAMUSCULAR | Status: DC | PRN
  Administered 2021-07-08: 80 mg via INTRAVENOUS

## 2021-07-08 MED ORDER — DEXAMETHASONE SODIUM PHOSPHATE 10 MG/ML IJ SOLN
INTRAMUSCULAR | Status: AC
  Filled 2021-07-08: qty 1

## 2021-07-08 MED ORDER — SODIUM CHLORIDE 0.9 % IV SOLN: INTRAVENOUS | Status: DC | PRN

## 2021-07-08 MED ORDER — LIDOCAINE HCL (PF) 1 % IJ SOLN: 5.0000 mL | INTRAMUSCULAR | Status: DC | PRN

## 2021-07-08 MED ORDER — MIDAZOLAM HCL 2 MG/2ML IJ SOLN
INTRAMUSCULAR | Status: AC
  Filled 2021-07-08: qty 2

## 2021-07-08 MED ORDER — SODIUM CHLORIDE 0.9 % IV SOLN: 100.0000 mL | INTRAVENOUS | Status: DC | PRN

## 2021-07-08 MED ORDER — GLYCOPYRROLATE PF 0.2 MG/ML IJ SOSY
PREFILLED_SYRINGE | INTRAMUSCULAR | Status: DC | PRN
  Administered 2021-07-08: .2 mg via INTRAVENOUS

## 2021-07-08 MED ORDER — LACTATED RINGERS IV SOLN: INTRAVENOUS | Status: DC

## 2021-07-08 MED ORDER — FENTANYL CITRATE (PF) 250 MCG/5ML IJ SOLN
INTRAMUSCULAR | Status: AC
  Filled 2021-07-08: qty 5

## 2021-07-08 MED ORDER — ONDANSETRON HCL 4 MG/2ML IJ SOLN
INTRAMUSCULAR | Status: DC | PRN
  Administered 2021-07-08: 4 mg via INTRAVENOUS

## 2021-07-08 MED ORDER — FENTANYL CITRATE (PF) 100 MCG/2ML IJ SOLN: 25.0000 ug | INTRAMUSCULAR | Status: DC | PRN

## 2021-07-08 MED ORDER — ACETAMINOPHEN 500 MG PO TABS
1000.0000 mg | ORAL_TABLET | Freq: Once | ORAL | Status: AC
  Administered 2021-07-08: 1000 mg via ORAL
  Filled 2021-07-08: qty 2

## 2021-07-08 MED ORDER — OXYCODONE-ACETAMINOPHEN 5-325 MG PO TABS: 1.0000 | ORAL_TABLET | Freq: Four times a day (QID) | ORAL | Status: DC | PRN

## 2021-07-08 MED ORDER — HEPARIN 6000 UNIT IRRIGATION SOLUTION
Status: DC | PRN
  Administered 2021-07-08: 1

## 2021-07-08 MED ORDER — LIDOCAINE-EPINEPHRINE (PF) 1 %-1:200000 IJ SOLN
INTRAMUSCULAR | Status: DC | PRN
  Administered 2021-07-08: 10 mL

## 2021-07-08 MED ORDER — SODIUM CHLORIDE 0.9 % IV SOLN
1.5000 g | INTRAVENOUS | Status: AC
  Administered 2021-07-08: 1.5 g via INTRAVENOUS
  Filled 2021-07-08 (×2): qty 1.5

## 2021-07-08 MED ORDER — EPHEDRINE SULFATE-NACL 50-0.9 MG/10ML-% IV SOSY
PREFILLED_SYRINGE | INTRAVENOUS | Status: DC | PRN
  Administered 2021-07-08 (×5): 10 mg via INTRAVENOUS

## 2021-07-08 MED ORDER — FENTANYL CITRATE (PF) 250 MCG/5ML IJ SOLN
INTRAMUSCULAR | Status: DC | PRN
  Administered 2021-07-08: 50 ug via INTRAVENOUS
  Administered 2021-07-08: 25 ug via INTRAVENOUS
  Administered 2021-07-08: 50 ug via INTRAVENOUS

## 2021-07-08 MED ORDER — PHENYLEPHRINE 80 MCG/ML (10ML) SYRINGE FOR IV PUSH (FOR BLOOD PRESSURE SUPPORT)
PREFILLED_SYRINGE | INTRAVENOUS | Status: AC
  Filled 2021-07-08: qty 10

## 2021-07-08 MED ORDER — HEPARIN SODIUM (PORCINE) 1000 UNIT/ML DIALYSIS
1000.0000 [IU] | INTRAMUSCULAR | Status: DC | PRN
  Filled 2021-07-08: qty 1

## 2021-07-08 MED ORDER — GLYCOPYRROLATE PF 0.2 MG/ML IJ SOSY
PREFILLED_SYRINGE | INTRAMUSCULAR | Status: AC
  Filled 2021-07-08: qty 1

## 2021-07-08 MED ORDER — PENTAFLUOROPROP-TETRAFLUOROETH EX AERO: 1.0000 "application " | INHALATION_SPRAY | CUTANEOUS | Status: DC | PRN

## 2021-07-08 MED ORDER — LIDOCAINE-PRILOCAINE 2.5-2.5 % EX CREA: 1.0000 "application " | TOPICAL_CREAM | CUTANEOUS | Status: DC | PRN

## 2021-07-08 MED ORDER — ONDANSETRON HCL 4 MG/2ML IJ SOLN: 4.0000 mg | Freq: Once | INTRAMUSCULAR | Status: DC | PRN

## 2021-07-08 MED ORDER — ONDANSETRON HCL 4 MG/2ML IJ SOLN
INTRAMUSCULAR | Status: AC
  Filled 2021-07-08: qty 2

## 2021-07-08 MED ORDER — ORAL CARE MOUTH RINSE: 15.0000 mL | Freq: Once | OROMUCOSAL | Status: AC

## 2021-07-08 MED ORDER — HEPARIN 6000 UNIT IRRIGATION SOLUTION
Status: AC
  Filled 2021-07-08: qty 500

## 2021-07-08 MED ORDER — HEPARIN SODIUM (PORCINE) 1000 UNIT/ML IJ SOLN
INTRAMUSCULAR | Status: AC
  Filled 2021-07-08: qty 10

## 2021-07-08 MED ORDER — ALBUMIN HUMAN 5 % IV SOLN: INTRAVENOUS | Status: DC | PRN

## 2021-07-08 MED ORDER — CHLORHEXIDINE GLUCONATE 0.12 % MT SOLN
15.0000 mL | Freq: Once | OROMUCOSAL | Status: AC
  Administered 2021-07-08: 15 mL via OROMUCOSAL
  Filled 2021-07-08: qty 15

## 2021-07-08 MED ORDER — 0.9 % SODIUM CHLORIDE (POUR BTL) OPTIME
TOPICAL | Status: DC | PRN
  Administered 2021-07-08: 1000 mL

## 2021-07-08 MED ORDER — PANTOPRAZOLE SODIUM 40 MG PO TBEC
40.0000 mg | DELAYED_RELEASE_TABLET | Freq: Two times a day (BID) | ORAL | Status: DC
  Administered 2021-07-08 – 2021-07-11 (×6): 40 mg via ORAL
  Filled 2021-07-08 (×6): qty 1

## 2021-07-08 MED ORDER — LIDOCAINE-EPINEPHRINE (PF) 1 %-1:200000 IJ SOLN
INTRAMUSCULAR | Status: AC
  Filled 2021-07-08: qty 30

## 2021-07-08 MED ORDER — EPHEDRINE 5 MG/ML INJ
INTRAVENOUS | Status: AC
  Filled 2021-07-08: qty 10

## 2021-07-08 MED ORDER — HEPARIN SODIUM (PORCINE) 1000 UNIT/ML IJ SOLN
INTRAMUSCULAR | Status: DC | PRN
  Administered 2021-07-08 (×2): 1900 [IU]

## 2021-07-08 MED ORDER — SEVELAMER CARBONATE 800 MG PO TABS
1600.0000 mg | ORAL_TABLET | Freq: Three times a day (TID) | ORAL | Status: DC
  Administered 2021-07-08 – 2021-07-11 (×8): 1600 mg via ORAL
  Filled 2021-07-08 (×9): qty 2

## 2021-07-08 MED ORDER — PROPOFOL 10 MG/ML IV BOLUS
INTRAVENOUS | Status: AC
  Filled 2021-07-08: qty 20

## 2021-07-08 MED ORDER — ALTEPLASE 2 MG IJ SOLR: 2.0000 mg | Freq: Once | INTRAMUSCULAR | Status: DC | PRN

## 2021-07-08 MED ORDER — PROPOFOL 10 MG/ML IV BOLUS
INTRAVENOUS | Status: DC | PRN
  Administered 2021-07-08: 130 mg via INTRAVENOUS
  Administered 2021-07-08: 30 mg via INTRAVENOUS

## 2021-07-08 MED ORDER — DEXAMETHASONE SODIUM PHOSPHATE 4 MG/ML IJ SOLN
INTRAMUSCULAR | Status: DC | PRN
  Administered 2021-07-08: 5 mg via INTRAVENOUS

## 2021-07-08 MED ORDER — PHENYLEPHRINE HCL-NACL 20-0.9 MG/250ML-% IV SOLN
INTRAVENOUS | Status: DC | PRN
  Administered 2021-07-08: 50 ug/min via INTRAVENOUS

## 2021-07-08 MED ORDER — PHENYLEPHRINE 80 MCG/ML (10ML) SYRINGE FOR IV PUSH (FOR BLOOD PRESSURE SUPPORT)
PREFILLED_SYRINGE | INTRAVENOUS | Status: DC | PRN
  Administered 2021-07-08 (×11): 80 ug via INTRAVENOUS

## 2021-07-08 MED ORDER — LIDOCAINE 2% (20 MG/ML) 5 ML SYRINGE
INTRAMUSCULAR | Status: AC
  Filled 2021-07-08: qty 5

## 2021-07-08 SURGICAL SUPPLY — 44 items
ADH SKN CLS APL DERMABOND .7 (GAUZE/BANDAGES/DRESSINGS) ×2
ARMBAND PINK RESTRICT EXTREMIT (MISCELLANEOUS) ×7 IMPLANT
BAG DECANTER FOR FLEXI CONT (MISCELLANEOUS) ×4 IMPLANT
BIOPATCH RED 1 DISK 7.0 (GAUZE/BANDAGES/DRESSINGS) ×4 IMPLANT
CANISTER SUCT 3000ML PPV (MISCELLANEOUS) ×4 IMPLANT
CATH PALINDROME-P 23CM W/VT (CATHETERS) ×1 IMPLANT
CLIP VESOCCLUDE MED 6/CT (CLIP) ×4 IMPLANT
CLIP VESOCCLUDE SM WIDE 6/CT (CLIP) ×4 IMPLANT
COVER PROBE W GEL 5X96 (DRAPES) ×4 IMPLANT
COVER SURGICAL LIGHT HANDLE (MISCELLANEOUS) ×4 IMPLANT
DERMABOND ADVANCED (GAUZE/BANDAGES/DRESSINGS) ×1
DERMABOND ADVANCED .7 DNX12 (GAUZE/BANDAGES/DRESSINGS) ×3 IMPLANT
DRAPE C-ARM 42X72 X-RAY (DRAPES) ×4 IMPLANT
ELECT REM PT RETURN 9FT ADLT (ELECTROSURGICAL) ×3
ELECTRODE REM PT RTRN 9FT ADLT (ELECTROSURGICAL) ×3 IMPLANT
GAUZE 4X4 16PLY ~~LOC~~+RFID DBL (SPONGE) ×4 IMPLANT
GLOVE SURG SS PI 7.5 STRL IVOR (GLOVE) ×12 IMPLANT
GOWN STRL REUS W/ TWL LRG LVL3 (GOWN DISPOSABLE) ×6 IMPLANT
GOWN STRL REUS W/ TWL XL LVL3 (GOWN DISPOSABLE) ×3 IMPLANT
GOWN STRL REUS W/TWL LRG LVL3 (GOWN DISPOSABLE) ×12
GOWN STRL REUS W/TWL XL LVL3 (GOWN DISPOSABLE) ×6
KIT BASIN OR (CUSTOM PROCEDURE TRAY) ×4 IMPLANT
KIT PALINDROME-P 55CM (CATHETERS) IMPLANT
KIT TURNOVER KIT B (KITS) ×4 IMPLANT
NDL HYPO 25GX1X1/2 BEV (NEEDLE) ×3 IMPLANT
NEEDLE HYPO 25GX1X1/2 BEV (NEEDLE) ×3 IMPLANT
NS IRRIG 1000ML POUR BTL (IV SOLUTION) ×4 IMPLANT
PACK CV ACCESS (CUSTOM PROCEDURE TRAY) ×4 IMPLANT
PACK SURGICAL SETUP 50X90 (CUSTOM PROCEDURE TRAY) ×4 IMPLANT
PAD ARMBOARD 7.5X6 YLW CONV (MISCELLANEOUS) ×8 IMPLANT
SUT ETHILON 3 0 PS 1 (SUTURE) ×4 IMPLANT
SUT MNCRL AB 4-0 PS2 18 (SUTURE) ×1 IMPLANT
SUT PROLENE 6 0 CC (SUTURE) ×5 IMPLANT
SUT VIC AB 3-0 SH 27 (SUTURE) ×3
SUT VIC AB 3-0 SH 27X BRD (SUTURE) ×3 IMPLANT
SUT VICRYL 4-0 PS2 18IN ABS (SUTURE) ×4 IMPLANT
SYR 10ML LL (SYRINGE) ×4 IMPLANT
SYR 20ML LL LF (SYRINGE) ×8 IMPLANT
SYR 5ML LL (SYRINGE) ×4 IMPLANT
SYR CONTROL 10ML LL (SYRINGE) ×4 IMPLANT
TOWEL GREEN STERILE (TOWEL DISPOSABLE) ×8 IMPLANT
TOWEL GREEN STERILE FF (TOWEL DISPOSABLE) ×4 IMPLANT
UNDERPAD 30X36 HEAVY ABSORB (UNDERPADS AND DIAPERS) ×4 IMPLANT
WATER STERILE IRR 1000ML POUR (IV SOLUTION) ×4 IMPLANT

## 2021-07-08 NOTE — Anesthesia Postprocedure Evaluation (Signed)
Anesthesia Post Note ? ?Patient: Christopher [name] ? ?Procedure(s) Performed: RIGHT ARM FISTULA  CREATION (Right) ?INSERTION OF DIALYSIS CATHETER ? ?  ? ?Patient location during evaluation: PACU ?Anesthesia Type: General ?Level of consciousness: awake and alert ?Pain management: pain level controlled ?Vital Signs Assessment: post-procedure vital signs reviewed and stable ?Respiratory status: spontaneous breathing, nonlabored ventilation, respiratory function stable and patient connected to nasal cannula oxygen ?Cardiovascular status: blood pressure returned to baseline and stable ?Postop Assessment: no apparent nausea or vomiting ?Anesthetic complications: no ? ? ?No notable events documented. ? ?Last Vitals:  ?Vitals:  ? 07/08/21 1656 07/08/21 1936  ?BP: (!) 141/97 131/75  ?Pulse:  78  ?Resp:  20  ?Temp: 36.5 ?C 36.6 ?C  ?SpO2:  98%  ?  ?Last Pain:  ?Vitals:  ? 07/08/21 1940  ?TempSrc:   ?PainSc: 0-No pain  ? ? ?  ?  ?  ?  ?  ?  ? ?[place] ? ? ? ? ?

## 2021-07-08 NOTE — Progress Notes (Signed)
?[name] ?Progress Note  ? ?Patient is a 71 year old male with a past medical history of hypertension and transitional cell carcinoma of the bladder. He initially presented with abdominal pain and worsening labs, found to develop new onset atrial fibrillation in RVR.  ? ?Assessment/ Plan:   ? 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 Per chart review, baseline appears to be around 2.4-2.8. Cr 2.75 in July 2022. Follows up with Dr. Moshe [name] outpatient and has had progressive worsening of renal function. Cr was already 5.57 on 06/02/21 with small kidneys noted on u/s with a bland sediment on urine microscopy. Renal US notable for no hydronephrosis in the right kidney while left kidney unable to be visualized with 5.5 cm cyst in the upper pole of the right kidney. Prior renal US in June 2021 showed potential atrophy with smaller cysts that were less concerning. AKI likely secondary to prerenal etiology given hypertension with possible intrinsic component given presence of cysts although less likely given simple cystic structures.  ?-Renally dose medications given GFR <15. ?-Avoid nephrotoxic agents.  ?-Plan to start hemodialysis given progressively worsening renal function noted over time followed by Dr. Moshe [name] at [facility] with Cr already 5.57 06/02/21, awaiting placement. ?-s/p lasix 80 mg bid, UOP 1.45L over the past 24 hours ?-Compression stockings  ?-CLIP ?-Appreciate  VVS taking pt to OR today for permanent access placement + TC to initiate dialysis. ? ?Renal osteodystrophy - increase renvela 800 2 tabs TIDM. ?Normocytic anemia: Hgb 9.1 with MCV 86.7 today, etiology likely secondary to anemia of chronic disease and iron deficiency anemia.  Iron deficient -> will load with Ferrlecit when he is on dialysis + likely will need ESA during this hospitalization but will monitor closely. ?History of transitional cell carcinoma of bladder: Noted in 2007, s/p surgery without  neoadjuvant chemotherapy.  ?Atrial fibrillation with RVR: new onset, most recently not in RVR with HR within normal limits. Monitor HR and continue cardiac telemetry. Continue metoprolol.  ?Recent History of Hyperkalemia: K 4.2 most recently. Will continue to monitor, holding lisinopril.   ?Hypertension: Continue to hold home lisinopril given worsening renal function, monitor BP per primary team. ? ?Subjective:   ?Still poor appetite but no n/v/dyspnea. ?IV is in the rt upper arm. ?No events overnight  ? ?Objective:   ?BP (!) 140/94   Pulse (!) 55   Temp 97.6 ?F (36.4 ?C)   Resp 15   Ht '5\' 9"'$  (1.753 m)   Wt 94.1 kg   SpO2 96%   BMI 30.64 kg/m?  ? ?Intake/Output Summary (Last 24 hours) at 07/08/2021 0814 ?Last data filed at 07/08/2021 0451 ?Gross per 24 hour  ?Intake --  ?Output 1450 ml  ?Net -1450 ml  ? ?Weight change: -1.8 kg ? ?Physical Exam: ?GEN: NAD, A&Ox3, NCAT ?HEENT: No conjunctival pallor, EOMI ?NECK: Supple, no thyromegaly ?LUNGS: CTA B/L no rales, rhonchi or wheezing ?CV: irreg irreg, No R/G ?ABD: SNDNT +BS  ?EXT: 2+  lower extremity edema ? ? ?Imaging: ?US RENAL ? ?Result Date: 07/07/2021 ?CLINICAL DATA:  Renal failure. EXAM: RENAL / URINARY TRACT ULTRASOUND COMPLETE COMPARISON:  Abdominal ultrasound 06/05/2021 FINDINGS: Right Kidney: Renal measurements: 8.7 x 4.1 x 3.7 cm = volume: 70 mL. 5.5 cm cystic lesion identified upper pole right kidney. 3 cm cystic lesion identified towards the interpolar region. No hydronephrosis. Left Kidney: Left kidney could not be visualized. Bladder: Appears normal for degree of bladder distention. Other: None. IMPRESSION: 1. No hydronephrosis  in the right kidney. Left kidney could not be visualized. 2. Similar appearance 5.5 cm simple appearing cyst in the upper pole right kidney with smaller interpolar cystic lesion evident. Electronically Signed   By: Christopher Stanley M.D.   On: 07/07/2021 07:08  ? ?DG Chest Portable 1 View ? ?Result Date: 07/06/2021 ?CLINICAL DATA:   Pulmonary edema EXAM: PORTABLE CHEST 1 VIEW COMPARISON:  08/03/2008 FINDINGS: Mild bibasilar atelectasis. Lungs are otherwise clear. No pneumothorax or pleural effusion. Cardiac size within normal limits. Pulmonary vascularity is normal. No acute bone abnormality. IMPRESSION: No active disease. Electronically Signed   By: Christopher Salisbury M.D.   On: 07/06/2021 23:26  ? ?ECHOCARDIOGRAM COMPLETE ? ?Result Date: 07/07/2021 ?   ECHOCARDIOGRAM REPORT   [facility]:   [name] Date of Exam: 07/07/2021 Medical Rec #:  [id number]          Height:       69.0 in Accession #:    [id number]         Weight:       211.6 lb Date of Birth:  1950-08-06          BSA:          2.116 m? Patient Age:    80 years           BP:           124/98 mmHg Patient Gender: M                  HR:           79 bpm. Exam Location:  Inpatient Procedure: 2D Echo Indications:    Atrial fibrillation  History:        Patient has no prior history of Echocardiogram examinations.                 Risk Factors:Hypertension.  Sonographer:    Christopher [name] Referring Phys: Christopher [name]  Sonographer Comments: Image acquisition challenging due to patient body habitus. IMPRESSIONS  1. Left ventricular ejection fraction, by estimation, is 60 to 65%. The left ventricle has normal function. The left ventricle has no regional wall motion abnormalities. Left ventricular diastolic parameters are consistent with Grade I diastolic dysfunction (impaired relaxation).  2. Right ventricular systolic function is normal. The right ventricular size is normal.  3. Left atrial size was mild to moderately dilated.  4. The mitral valve is grossly normal. Mild mitral valve regurgitation. No evidence of mitral stenosis.  5. The aortic valve is tricuspid. Aortic valve regurgitation is mild. No aortic stenosis is present. FINDINGS  Left Ventricle: Left ventricular ejection fraction, by estimation, is 60 to 65%. The left ventricle has normal function. The left ventricle  has no regional wall motion abnormalities. The left ventricular internal cavity size was normal in size. There is  no left ventricular hypertrophy. Left ventricular diastolic parameters are consistent with Grade I diastolic dysfunction (impaired relaxation). Right Ventricle: The right ventricular size is normal. No increase in right ventricular wall thickness. Right ventricular systolic function is normal. Left Atrium: Left atrial size was mild to moderately dilated. Right Atrium: Right atrial size was normal in size. Pericardium: There is no evidence of pericardial effusion. Mitral Valve: The mitral valve is grossly normal. Mild mitral valve regurgitation. No evidence of mitral valve stenosis. Tricuspid Valve: The tricuspid valve is grossly normal. Tricuspid valve regurgitation is mild . No evidence of tricuspid stenosis. Aortic Valve: The aortic valve is tricuspid. Aortic valve regurgitation  is mild. Aortic regurgitation PHT measures 757 msec. No aortic stenosis is present. Aortic valve mean gradient measures 3.5 mmHg. Aortic valve peak gradient measures 7.1 mmHg. Aortic  valve area, by VTI measures 3.50 cm?. Pulmonic Valve: The pulmonic valve was grossly normal. Pulmonic valve regurgitation is not visualized. No evidence of pulmonic stenosis. Aorta: The aortic root and ascending aorta are structurally normal, with no evidence of dilitation. Venous: The inferior vena cava was not well visualized. IAS/Shunts: The atrial septum is grossly normal.  LEFT VENTRICLE PLAX 2D LVIDd:         3.70 cm   Diastology LVIDs:         2.80 cm   LV e' medial:    4.03 cm/s LV PW:         0.90 cm   LV E/e' medial:  12.0 LV IVS:        1.20 cm   LV e' lateral:   6.53 cm/s LVOT diam:     2.20 cm   LV E/e' lateral: 7.4 LV SV:         104 LV SV Index:   49 LVOT Area:     3.80 cm?  RIGHT VENTRICLE RV Basal diam:  3.80 cm RV Mid diam:    3.90 cm RV S prime:     9.57 cm/s TAPSE (M-mode): 1.9 cm LEFT ATRIUM            Index        RIGHT  ATRIUM           Index LA diam:      4.80 cm  2.27 cm/m?   RA Area:     14.20 cm? LA Vol (A2C): 52.7 ml  24.90 ml/m?  RA Volume:   37.20 ml  17.58 ml/m? LA Vol (A4C): 104.0 ml 49.14 ml/m?  AORTIC VALVE

## 2021-07-08 NOTE — Anesthesia Procedure Notes (Signed)
Procedure Name: LMA Insertion ?Date/Time: 07/08/2021 10:49 AM ?Performed by: Michele Rockers, CRNA ?Pre-anesthesia Checklist: Patient identified, Emergency Drugs available, Suction available, Timeout performed and Patient being monitored ?Patient Re-evaluated:Patient Re-evaluated prior to induction ?Oxygen Delivery Method: Circle system utilized ?Preoxygenation: Pre-oxygenation with 100% oxygen ?Induction Type: IV induction ?LMA: LMA inserted ?LMA Size: 5.0 ?Number of attempts: 1 ?Placement Confirmation: breath sounds checked- equal and bilateral and positive ETCO2 ?Tube secured with: Tape ? ? ? ? ?

## 2021-07-08 NOTE — Progress Notes (Signed)
Requested to see pt for out-pt HD needs at d/c. Met with pt at bedside this morning prior to pt being taken down for procedure. Introduced self and explained role. Pt prefers [facility] as out-pt HD clinic. Referral made to [facility] admissions this morning. Pt states that pt's wife will assist with transportation to HD appts. Will assist as needed.  ? ?Melven Sartorius ?Renal Navigator ?[phone] ?

## 2021-07-08 NOTE — Anesthesia Preprocedure Evaluation (Addendum)
Anesthesia Evaluation  ?Patient identified by MRN, date of birth, ID band ?Patient awake ? ? ? ?Reviewed: ?Allergy & Precautions, NPO status , Patient's Chart, lab work & pertinent test results ? ?Airway ?Mallampati: II ? ?TM Distance: >3 FB ?Neck ROM: Full ? ? ? Dental ? ?(+) Dental Advisory Given, Missing, Poor Dentition ?  ?Pulmonary ?former smoker,  ?  ?Pulmonary exam normal ?breath sounds clear to auscultation ? ? ? ? ? ? Cardiovascular ?hypertension, Pt. on medications ?+ dysrhythmias Atrial Fibrillation  ?Rhythm:Irregular Rate:Abnormal ? ? ?  ?Neuro/Psych ?PSYCHIATRIC DISORDERS Anxiety Depression negative neurological ROS ?   ? GI/Hepatic ?Neg liver ROS, GERD  Medicated,  ?Endo/Other  ?negative endocrine ROSObesity ? ? Renal/GU ?ESRFRenal disease  ? ?Bladder cancer ? ? ?  ?Musculoskeletal ?negative musculoskeletal ROS ?(+)  ? Abdominal ?  ?Peds ? Hematology ? ?(+) Blood dyscrasia, anemia ,   ?Anesthesia Other Findings ?Day of surgery medications reviewed with the patient. ? Reproductive/Obstetrics ? ?  ? ? ? ? ? ? ? ? ? ? ? ? ? ?  ?  ? ? ? ? ? ? ?Anesthesia Physical ?Anesthesia Plan ? ?ASA: 3 ? ?Anesthesia Plan: General  ? ?Post-op Pain Management: Tylenol PO (pre-op)*  ? ?Induction: Intravenous ? ?PONV Risk Score and Plan: 2 and Dexamethasone and Ondansetron ? ?Airway Management Planned: LMA ? ?Additional Equipment:  ? ?Intra-op Plan:  ? ?Post-operative Plan: Extubation in OR ? ?Informed Consent: I have reviewed the patients History and Physical, chart, labs and discussed the procedure including the risks, benefits and alternatives for the proposed anesthesia with the patient or authorized representative who has indicated his/her understanding and acceptance.  ? ? ? ?Dental advisory given ? ?Plan Discussed with: CRNA ? ?Anesthesia Plan Comments:   ? ? ? ? ? ? ?Anesthesia Quick Evaluation ? ?

## 2021-07-08 NOTE — Transfer of Care (Signed)
Immediate Anesthesia Transfer of Care Note ? ?Patient: Christopher [name] ? ?Procedure(s) Performed: RIGHT ARM FISTULA  CREATION (Right) ?INSERTION OF DIALYSIS CATHETER ? ?Patient Location: PACU ? ?Anesthesia Type:General ? ?Level of Consciousness: awake, patient cooperative and responds to stimulation ? ?Airway & Oxygen Therapy: Patient Spontanous Breathing and Patient connected to nasal cannula oxygen ? ?Post-op Assessment: Report given to RN and Post -op Vital signs reviewed and stable ? ?Post vital signs: Reviewed and stable ? ?Last Vitals:  ?Vitals Value Taken Time  ?BP    ?Temp    ?Pulse    ?Resp    ?SpO2    ? ? ?Last Pain:  ?Vitals:  ? 07/08/21 0925  ?TempSrc: Oral  ?PainSc:   ?   ? ?  ? ?Complications: No notable events documented. ?

## 2021-07-08 NOTE — Progress Notes (Signed)
Pt gold wedding band removed. Juliann Pulse, RN taking care of pt 4E-12 will come herself or send someone to come pick up pt ring and bring back to pt room.  ?

## 2021-07-08 NOTE — Discharge Instructions (Addendum)
? ?[facility] ? ?Discharge Instructions ? ?AV Fistula or Graft Surgery for Dialysis Access ? ?Please refer to the following instructions for your post-procedure care. Your surgeon or physician assistant will discuss any changes with you. ? ?Activity ? ?You may drive the day following your surgery, if you are comfortable and no longer taking prescription pain medication. Resume full activity as the soreness in your incision resolves. ? ?Bathing/Showering ? ?You may shower after you go home. Keep your incision dry for 48 hours. Do not soak in a bathtub, hot tub, or swim until the incision heals completely. You may not shower if you have a hemodialysis catheter. ? ?Incision Care ? ?Clean your incision with mild soap and water after 48 hours. Pat the area dry with a clean towel. You do not need a bandage unless otherwise instructed. Do not apply any ointments or creams to your incision. You may have skin glue on your incision. Do not peel it off. It will come off on its own in about one week. Your arm may swell a bit after surgery. To reduce swelling use pillows to elevate your arm so it is above your heart. Your doctor will tell you if you need to lightly wrap your arm with an ACE bandage. ? ?Diet ? ?Resume your normal diet. There are not special food restrictions following this procedure. In order to heal from your surgery, it is CRITICAL to get adequate nutrition. Your body requires vitamins, minerals, and protein. Vegetables are the best source of vitamins and minerals. Vegetables also provide the perfect balance of protein. Processed food has little nutritional value, so try to avoid this. ? ?Medications ? ?Resume taking all of your medications. If your incision is causing pain, you may take over-the counter pain relievers such as acetaminophen (Tylenol). If you were prescribed a stronger pain medication, please be aware these medications can cause nausea and constipation. Prevent  nausea by taking the medication with a snack or meal. Avoid constipation by drinking plenty of fluids and eating foods with high amount of fiber, such as fruits, vegetables, and grains.  ?Do not take Tylenol if you are taking prescription pain medications. ? ?Follow up ?Your surgeon may want to see you in the office following your access surgery. If so, this will be arranged at the time of your surgery. ? ?Please call us immediately for any of the following conditions: ? ?Increased pain, redness, drainage (pus) from your incision site ?Fever of 101 degrees or higher ?Severe or worsening pain at your incision site ?Hand pain or numbness. ? ?Reduce your risk of vascular disease: ? ?Stop smoking. If you would like help, call [facility] at 1-800-QUIT-NOW (818)442-8473) or [place] at (661)423-6520 ? ?Manage your cholesterol ?Maintain a desired weight ?Control your diabetes ?Keep your blood pressure down ? ?Dialysis ? ?It will take several weeks to several months for your new dialysis access to be ready for use. Your surgeon will determine when it is okay to use it. Your nephrologist will continue to direct your dialysis. You can continue to use your Permcath until your new access is ready for use. ? ? ?07/08/2021 ?Christopher [name] ?[id number] ?Jan 10, 1951 ? ?Surgeon(s): ?[name], MD ? ?Procedure(s): ?Creation of right brachiocephalic AV fistula ?INSERTION OF DIALYSIS CATHETER ? ?x Do not stick fistula for 12 weeks  ? ? ?If you have any questions, please call the office at [phone]. ? ?Information on my medicine - ELIQUIS? (apixaban) ? ?This medication education  was reviewed with me or my healthcare representative as part of my discharge preparation.   ? ?Why was Eliquis? prescribed for you? ?Eliquis? was prescribed for you to reduce the risk of a blood clot forming that can cause a stroke if you have a medical condition called atrial fibrillation (a type of irregular heartbeat). ? ?What do You need to  know about Eliquis? ? ?Take your Eliquis? TWICE DAILY - one tablet in the morning and one tablet in the evening with or without food. If you have difficulty swallowing the tablet whole please discuss with your pharmacist how to take the medication safely. ? ?Take Eliquis? exactly as prescribed by your doctor and DO NOT stop taking Eliquis? without talking to the doctor who prescribed the medication.  Stopping may increase your risk of developing a stroke.  Refill your prescription before you run out. ? ?After discharge, you should have regular check-up appointments with your healthcare provider that is prescribing your Eliquis?.  In the future your dose may need to be changed if your kidney function or weight changes by a significant amount or as you get older. ? ?What do you do if you miss a dose? ?If you miss a dose, take it as soon as you remember on the same day and resume taking twice daily.  Do not take more than one dose of ELIQUIS at the same time to make up a missed dose. ? ?Important Safety Information ?A possible side effect of Eliquis? is bleeding. You should call your healthcare provider right away if you experience any of the following: ?Bleeding from an injury or your nose that does not stop. ?Unusual colored urine (red or dark brown) or unusual colored stools (red or black). ?Unusual bruising for unknown reasons. ?A serious fall or if you hit your head (even if there is no bleeding). ? ?Some medicines may interact with Eliquis? and might increase your risk of bleeding or clotting while on Eliquis?Marland Kitchen To help avoid this, consult your healthcare provider or pharmacist prior to using any new prescription or non-prescription medications, including herbals, vitamins, non-steroidal anti-inflammatory drugs (NSAIDs) and supplements. ? ?This website has more information on Eliquis? (apixaban): http://www.eliquis.com/eliquis/home  ? ?

## 2021-07-08 NOTE — Progress Notes (Addendum)
ANTICOAGULATION CONSULT NOTE - Follow Up Consult ? ?Pharmacy Consult for heparin ?Indication: atrial fibrillation ? ?Labs: ?Recent Labs  ?  07/06/21 ?1600 07/07/21 ?0212 07/07/21 ?0354 07/07/21 ?1118 07/07/21 ?2204  ?HGB 10.5*  --   --  10.0*  --   ?HCT 33.0*  --   --  30.5*  --   ?PLT 333  --   --  321  --   ?HEPARINUNFRC  --   --   --   --  0.57  ?CREATININE 7.25*  --  7.31* 7.28*  --   ?TROPONINIHS  --  28* 31*  --   --   ? ? ?Assessment/Plan:  ?71yo male therapeutic on heparin with initial dosing for new Afib. Will continue infusion at current rate of 1250 units/hr and confirm stable with am labs.  ? ?Wynona Neat, PharmD, BCPS  ?07/08/2021,12:05 AM ? ?Addendum: ?AM heparin level now above goal at 0.75; RN reports no s/sx of bleeding though it is notes that Hgb is trending down (10.5>10>9.1).  Will decrease heparin gtt by 1-2 units/kg/hr to 1100 units/hr and check level in 8 hours.    VB 3:37 AM  ?

## 2021-07-08 NOTE — Interval H&P Note (Signed)
History and Physical Interval Note: ? ?07/08/2021 ?9:55 AM ? ?Christopher [name]  has presented today for surgery, with the diagnosis of ESRD.  The various methods of treatment have been discussed with the patient and family. After consideration of risks, benefits and other options for treatment, the patient has consented to  Procedure(s): ?RIGHT ARM FISTULA VS. GRAFT CREATION (Right) ?INSERTION OF DIALYSIS CATHETER (N/A) as a surgical intervention.  The patient's history has been reviewed, patient examined, no change in status, stable for surgery.  I have reviewed the patient's chart and labs.  Questions were answered to the patient's satisfaction.   ? ? ?[name] ? ? ?

## 2021-07-08 NOTE — Progress Notes (Addendum)
?PROGRESS NOTE ? ? ? ?Christopher [name]  QQV:[id number] DOB: January 27, 1951 DOA: 07/06/2021 ?PCP: Horald Pollen, MD  ?Narrative: 70/M with history of hypertension, CKD 4, ongoing work-up for abdominal pain was sent to the ED after he was found to have worsening creatinine, hyperkalemia. ?-In addition has been having abdominal discomfort for 6 months, had a ultrasound which noted gallbladder stones without cholecystitis, he saw a gastroenterologist Dr. Henrene Pastor who was planning to do an EGD but this was deferred in the setting of worsening renal function. ?-He is followed by Dr. Moshe Cipro with CKD, on Lasix at baseline. ?-In the ED he was noted to be in A-fib RVR, creatinine of 7.2, potassium of 4.6, admitted with AKI on CKD 4, new onset A-fib with RVR ? ?Subjective: ?-Feels okay overall, reports anorexia, no nausea vomiting no dyspnea today ? ?Assessment and Plan: ? ?AKI on CKD 4  ?-Secondary to progressive kidney disease  ?-Last creatinine was 5.5 on 3/14 ?-Renal ultrasound negative for hydronephrosis, 5.5 cm cyst in the right kidney  ?-Urine output is good, nephrology following plan to start HD this admission  ?-Given IV Lasix yesterday, with 1.4 L urine output  ?-Vascular surgery consulted for access, dialysis catheter  ? ?History of transitional cell bladder cancer ?-Surgery in 2007 ?-Ultrasound unremarkable this admission ? ?A-fib with RVR ?-New onset, converted to sinus rhythm, continue metoprolol ?-Echo with preserved EF, grade 1 DD, no significant valvular disease, mild to moderate left atrial dilation, TSH is normal ?-Continue IV heparin now, switch to Eliquis after access procedures completed ? ?Hypertension ?-Lisinopril discontinued ? ?Acute on chronic anemia ?-Anemia panel with severe iron deficiency, starting IV iron ?-Will need close GI follow-up, see discussion below ? ?Abd pain, Intermittent dyspepsia, GERD symptoms ?-Continue PPI ?-Ongoing work-up by GI, Dr. Henrene Pastor, abdominal ultrasound in March  noted cholelithiasis without cholecystitis, EGD was planned 4/14 but cancelled in the setting of worsening renal failure ?-Continue PPI for now, there could be some overlap of symptoms from CKD5 ?  ?DVT prophylaxis: Heparin. ?Code Status: Full code. ?Family Communication: Patient's wife at the bedside. ?Disposition Plan: Home when stable. ?Consults called: Nephrology. ? ? ?Consultants: Nephrology ? ? ?Procedures:  ? ?Antimicrobials:  ? ? ?Objective: ?Vitals:  ? 07/08/21 0400 07/08/21 0700 07/08/21 0900 07/08/21 0925  ?BP: (!) 140/94   (!) 156/77  ?Pulse: (!) 55  (!) 51 (!) 55  ?Resp: '15  16 20  '$ ?Temp:  97.6 ?F (36.4 ?C)  97.7 ?F (36.5 ?C)  ?TempSrc:    Oral  ?SpO2: 96%  99% 97%  ?Weight:    93.4 kg  ?Height:    '5\' 9"'$  (1.753 m)  ? ? ?Intake/Output Summary (Last 24 hours) at 07/08/2021 1159 ?Last data filed at 07/08/2021 1144 ?Gross per 24 hour  ?Intake 850 ml  ?Output 1480 ml  ?Net -630 ml  ? ?Filed Weights  ? 07/07/21 1600 07/08/21 0359 07/08/21 0925  ?Weight: 94.2 kg 94.1 kg 93.4 kg  ? ? ?Examination: ? ?General exam: Pleasant male sitting up in bed, AAOx3, no distress ?HEENT: Positive JVD ?CVS: S1-S2, irregularly irregular rhythm ?Lungs: Clear bilaterally ?Abdomen: Soft, nontender, bowel sounds present ?Extremities: 2+ edema ?Skin: No rashes ?Psychiatry:  Mood & affect appropriate.  ? ? ? ?Data Reviewed:  ? ?CBC: ?Recent Labs  ?Lab 07/06/21 ?1600 07/07/21 ?1118 07/08/21 ?0223  ?WBC 6.8 7.0 7.5  ?NEUTROABS  --  4.7 4.4  ?HGB 10.5* 10.0* 9.1*  ?HCT 33.0* 30.5* 28.1*  ?MCV 87.8 87.4 86.7  ?PLT  333 321 272  ? ?Basic Metabolic Panel: ?Recent Labs  ?Lab 07/03/21 ?1123 07/06/21 ?1600 07/07/21 ?0212 07/07/21 ?0354 07/07/21 ?1118 07/08/21 ?0223  ?NA 140 140  --   --  142 138  ?K 5.7* 4.6  --   --  4.8 4.2  ?CL 105 106  --   --  108 107  ?CO2 26 23  --   --  24 22  ?GLUCOSE 85 96  --   --  102* 96  ?BUN 69* 69*  --   --  76* 77*  ?CREATININE 7.01* 7.25*  --  7.31* 7.28* 7.54*  ?CALCIUM 9.3 9.1  --   --  9.1 8.8*  ?MG  --   --   2.0  --   --   --   ?PHOS  --   --  6.4*  --  6.9* 6.4*  ? ?GFR: ?Estimated Creatinine Clearance: 10.3 mL/min (A) (by C-G formula based on SCr of 7.54 mg/dL (H)). ?Liver Function Tests: ?Recent Labs  ?Lab 07/07/21 ?0212 07/07/21 ?1118 07/08/21 ?0223  ?AST 12* 12*  --   ?ALT 8 8  --   ?ALKPHOS 97 83  --   ?BILITOT 0.5 0.4  --   ?PROT 6.6 6.1*  --   ?ALBUMIN 3.3* 3.0* 2.8*  ? ?No results for input(s): LIPASE, AMYLASE in the last 168 hours. ?No results for input(s): AMMONIA in the last 168 hours. ?Coagulation Profile: ?No results for input(s): INR, PROTIME in the last 168 hours. ?Cardiac Enzymes: ?No results for input(s): CKTOTAL, CKMB, CKMBINDEX, TROPONINI in the last 168 hours. ?BNP (last 3 results) ?No results for input(s): PROBNP in the last 8760 hours. ?HbA1C: ?No results for input(s): HGBA1C in the last 72 hours. ?CBG: ?No results for input(s): GLUCAP in the last 168 hours. ?Lipid Profile: ?No results for input(s): CHOL, HDL, LDLCALC, TRIG, CHOLHDL, LDLDIRECT in the last 72 hours. ?Thyroid Function Tests: ?Recent Labs  ?  07/07/21 ?0212  ?TSH 4.185  ? ?Anemia Panel: ?Recent Labs  ?  07/07/21 ?0212  ?VITAMINB12 299  ?FOLATE 16.5  ?FERRITIN 98  ?TIBC 279  ?IRON 18*  ?RETICCTPCT 1.2  ? ?Urine analysis: ?   ?Component Value Date/Time  ? COLORURINE YELLOW 07/06/2021 1513  ? APPEARANCEUR CLEAR 07/06/2021 1513  ? LABSPEC 1.014 07/06/2021 1513  ? PHURINE 5.0 07/06/2021 1513  ? GLUCOSEU NEGATIVE 07/06/2021 1513  ? HGBUR NEGATIVE 07/06/2021 1513  ? BILIRUBINUR NEGATIVE 07/06/2021 1513  ? BILIRUBINUR neg 02/06/2013 1332  ? KETONESUR NEGATIVE 07/06/2021 1513  ? PROTEINUR 100 (A) 07/06/2021 1513  ? UROBILINOGEN 0.2 02/06/2013 1332  ? NITRITE NEGATIVE 07/06/2021 1513  ? LEUKOCYTESUR NEGATIVE 07/06/2021 1513  ? ?Sepsis Labs: ?'@LABRCNTIP'$ (procalcitonin:4,lacticidven:4) ? ?)No results found for this or any previous visit (from the past 240 hour(s)).  ? ?Radiology Studies: ?US RENAL ? ?Result Date: 07/07/2021 ?CLINICAL DATA:  Renal  failure. EXAM: RENAL / URINARY TRACT ULTRASOUND COMPLETE COMPARISON:  Abdominal ultrasound 06/05/2021 FINDINGS: Right Kidney: Renal measurements: 8.7 x 4.1 x 3.7 cm = volume: 70 mL. 5.5 cm cystic lesion identified upper pole right kidney. 3 cm cystic lesion identified towards the interpolar region. No hydronephrosis. Left Kidney: Left kidney could not be visualized. Bladder: Appears normal for degree of bladder distention. Other: None. IMPRESSION: 1. No hydronephrosis in the right kidney. Left kidney could not be visualized. 2. Similar appearance 5.5 cm simple appearing cyst in the upper pole right kidney with smaller interpolar cystic lesion evident. Electronically Signed   By: Randall Hiss  Tery Sanfilippo M.D.   On: 07/07/2021 07:08  ? ?DG Chest Portable 1 View ? ?Result Date: 07/06/2021 ?CLINICAL DATA:  Pulmonary edema EXAM: PORTABLE CHEST 1 VIEW COMPARISON:  08/03/2008 FINDINGS: Mild bibasilar atelectasis. Lungs are otherwise clear. No pneumothorax or pleural effusion. Cardiac size within normal limits. Pulmonary vascularity is normal. No acute bone abnormality. IMPRESSION: No active disease. Electronically Signed   By: Fidela Salisbury M.D.   On: 07/06/2021 23:26  ? ?ECHOCARDIOGRAM COMPLETE ? ?Result Date: 07/07/2021 ?   ECHOCARDIOGRAM REPORT   Patient Name:   Christopher [name] [facility] Date of Exam: 07/07/2021 Medical Rec #:  [id number]          Height:       69.0 in Accession #:    [id number]         Weight:       211.6 lb Date of Birth:  [birth year]          BSA:          2.116 m? Patient Age:    71 years           BP:           124/98 mmHg Patient Gender: M                  HR:           79 bpm. Exam Location:  Inpatient Procedure: 2D Echo Indications:    Atrial fibrillation  History:        Patient has no prior history of Echocardiogram examinations.                 Risk Factors:Hypertension.  Sonographer:    Arlyss Gandy Referring Phys: Welch  Sonographer Comments: Image acquisition challenging due to patient body  habitus. IMPRESSIONS  1. Left ventricular ejection fraction, by estimation, is 60 to 65%. The left ventricle has normal function. The left ventricle has no regional wall motion abnormalities. Left ven

## 2021-07-08 NOTE — Op Note (Signed)
? ? ?Patient name: Christopher [name] MRN: [id number] DOB: 1951/03/10 Sex: male ? ?07/08/2021 ?Pre-operative Diagnosis: End-stage renal disease ?Post-operative diagnosis:  Same ?Surgeon:  Annamarie Major ?Assistants: Aldona Bar Ryne ?Procedure:   #1: Ultrasound-guided access, right internal jugular vein and placement of a 23 cm tunneled dialysis catheter using fluoroscopic guidance. ?  #2: Right brachiocephalic fistula ?Anesthesia: General ?Blood Loss: Minimal ? ?Findings: Healthy appearing 4 mm brachial artery and 4-5 mm basilic vein. ? ?Indications: This is a 71 year old gentleman with new onset dialysis needs.  He comes in today for catheter and fistula.  He is left-handed. ? ?Procedure:  The patient was identified in the holding area and taken to [place] 11  The patient was then placed supine on the table. general anesthesia was administered.  The patient was prepped and draped in the usual sterile fashion.  A time out was called and antibiotics were administered.  A PA was necessary to explant procedure and assist with technical details. ? ?I initially began by placing the catheter.  The right internal jugular vein was evaluated ultrasound which was found to be widely patent and easily compressible.  A skin nick was made with an 11 blade.  The right internal jugular vein was cannulated under ultrasound guidance an 18-gauge needle.  Was used to cannulate the right internal jugular vein under ultrasound guidance.  An 035 wire was advanced into the inferior vena cava under fluoroscopic imaging.  A peel-away sheath was then placed.  Next a 23 cm catheter was selected.  A skin nick was made below the clavicle.  The catheter was tunneled up to the neck incision and then placed into the peel-away sheath.  The catheter tip was then positioned at the cavoatrial junction.  Both ports flushed and aspirated without difficulty.  Fluoroscopy confirmed that there were no kinks within the catheter and the tip was in good  position.  The catheter was then filled with the appropriate volumes of heparin.  It was sutured in position with 3-0 nylon.  The neck incision was closed with 4-0 Vicryl and Dermabond. ? ?Attention was then turned towards the right arm.  Ultrasound was used to evaluate the cephalic vein in the upper arm which was of excellent diameter.  A transverse incision was made just above the antecubital crease.  With assistance from the PA providing suction and retraction, I exposed the brachial artery.  It was encircled with a vessel loop.  Next the cephalic vein was dissected free.  Once it was fully mobilized it was marked for orientation and ligated distally.  Distended nicely with heparin saline.  The brachial artery was then occluded with vascular clamps and a #11 that was used to make an arteriotomy which extended longitudinally with Potts scissors.  The vein was then cut the appropriate length and spatulated to size arteriotomy.  A running anastomosis was created with 6-0 Prolene.  Prior to completion appropriate flushing numbers were performed and the anastomosis was completed.  There was ?Thrill within the fistula.  There did appear to be somewhat decreased flow to the radial artery with the fistula present which returned to triphasic signals when the fistula was compressed.  After watching this for a while, the signal in the hand got better.  It did not change significantly with fistula compression so I elected not to band this.  We will continue to monitor his hand.  At this point time the PA performed a 2 layer closure with Dermabond.  There were no  complications. ? ? ?Disposition: To PACU stable. ? ? ?V. Annamarie Major, M.D., FACS ?[facility] ?Office: [phone] ?Pager:  [phone]  ?

## 2021-07-09 ENCOUNTER — Other Ambulatory Visit (HOSPITAL_COMMUNITY): Payer: Self-pay

## 2021-07-09 ENCOUNTER — Encounter (HOSPITAL_COMMUNITY): Payer: Self-pay | Admitting: Surgery

## 2021-07-09 DIAGNOSIS — N179 Acute kidney failure, unspecified: Secondary | ICD-10-CM | POA: Diagnosis not present

## 2021-07-09 DIAGNOSIS — R630 Anorexia: Secondary | ICD-10-CM

## 2021-07-09 DIAGNOSIS — R109 Unspecified abdominal pain: Secondary | ICD-10-CM

## 2021-07-09 LAB — CBC
HCT: 28.8 % — ABNORMAL LOW (ref 39.0–52.0)
Hemoglobin: 9.8 g/dL — ABNORMAL LOW (ref 13.0–17.0)
MCH: 28.6 pg (ref 26.0–34.0)
MCHC: 34 g/dL (ref 30.0–36.0)
MCV: 84 fL (ref 80.0–100.0)
Platelets: 313 10*3/uL (ref 150–400)
RBC: 3.43 MIL/uL — ABNORMAL LOW (ref 4.22–5.81)
RDW: 14.9 % (ref 11.5–15.5)
WBC: 8.3 10*3/uL (ref 4.0–10.5)
nRBC: 0 % (ref 0.0–0.2)

## 2021-07-09 LAB — COMPREHENSIVE METABOLIC PANEL
ALT: 9 U/L (ref 0–44)
AST: 16 U/L (ref 15–41)
Albumin: 3 g/dL — ABNORMAL LOW (ref 3.5–5.0)
Alkaline Phosphatase: 83 U/L (ref 38–126)
Anion gap: 11 (ref 5–15)
BUN: 39 mg/dL — ABNORMAL HIGH (ref 8–23)
CO2: 25 mmol/L (ref 22–32)
Calcium: 8.8 mg/dL — ABNORMAL LOW (ref 8.9–10.3)
Chloride: 100 mmol/L (ref 98–111)
Creatinine, Ser: 4.69 mg/dL — ABNORMAL HIGH (ref 0.61–1.24)
GFR, Estimated: 13 mL/min — ABNORMAL LOW (ref >60)
Glucose, Bld: 205 mg/dL — ABNORMAL HIGH (ref 70–99)
Potassium: 4.1 mmol/L (ref 3.5–5.1)
Sodium: 136 mmol/L (ref 135–145)
Total Bilirubin: 0.6 mg/dL (ref 0.3–1.2)
Total Protein: 6.4 g/dL — ABNORMAL LOW (ref 6.5–8.1)

## 2021-07-09 LAB — HEPARIN LEVEL (UNFRACTIONATED)
Heparin Unfractionated: 0.92 IU/mL — ABNORMAL HIGH (ref 0.30–0.70)
Heparin Unfractionated: 0.6 IU/mL (ref 0.30–0.70)
Heparin Unfractionated: 0.59 IU/mL (ref 0.30–0.70)

## 2021-07-09 LAB — HEPATITIS B SURFACE ANTIBODY, QUANTITATIVE: Hep B S AB Quant (Post): <3.1 m[IU]/mL — ABNORMAL LOW (ref >9.9)

## 2021-07-09 LAB — HEPATITIS B CORE ANTIBODY, TOTAL: Hep B Core Total Ab: NONREACTIVE

## 2021-07-09 LAB — HEPATITIS B E ANTIBODY: Hep B E Ab: NEGATIVE

## 2021-07-09 LAB — HEPATITIS B CORE ANTIBODY, IGM: Hep B C IgM: NONREACTIVE

## 2021-07-09 MED ORDER — SODIUM CHLORIDE 0.9 % IV SOLN: INTRAVENOUS | Status: DC

## 2021-07-09 MED ORDER — TAMSULOSIN HCL 0.4 MG PO CAPS
0.4000 mg | ORAL_CAPSULE | Freq: Every day | ORAL | Status: DC
  Administered 2021-07-09 – 2021-07-11 (×3): 0.4 mg via ORAL
  Filled 2021-07-09 (×3): qty 1

## 2021-07-09 MED ORDER — HEPARIN SODIUM (PORCINE) 1000 UNIT/ML IJ SOLN
INTRAMUSCULAR | Status: AC
  Filled 2021-07-09: qty 4

## 2021-07-09 MED ORDER — KIDNEY FAILURE BOOK: Freq: Once | Status: AC

## 2021-07-09 MED ORDER — SODIUM CHLORIDE 0.9 % IV SOLN
250.0000 mg | INTRAVENOUS | Status: DC
  Filled 2021-07-09 (×2): qty 20

## 2021-07-09 MED ORDER — PAROXETINE HCL 20 MG PO TABS
40.0000 mg | ORAL_TABLET | Freq: Every day | ORAL | Status: DC
  Administered 2021-07-09 – 2021-07-11 (×3): 40 mg via ORAL
  Filled 2021-07-09 (×3): qty 2

## 2021-07-09 NOTE — Consult Note (Signed)
? ?Referring Provider:  [facility], Dr. Broadus Dragon ?Primary Care Physician:  Horald Pollen, MD ?Primary Gastroenterologist:  Dr. Henrene Pastor ? ?Reason for Consultation:  Abdominal pain ? ?HPI: Christopher [name] is a 71 y.o. male with a remote history of bladder cancer, chronic kidney disease, hypertension, and anxiety/depression.  He was also being evaluated as an outpatient by Dr. Henrene Pastor for complaints of burning upper abdominal pain and he has been experiencing recently.  Had 1 episode of nausea and dry heaves, but that has not recurred.  He has had some decrease in appetite and weight loss that has occurred over the last several years since his retirement, thought possibly due to some anxiety and depression.  Nonetheless, for the new issue of abdominal pain he recently underwent ultrasound that showed gallstones but no acute cholecystitis.  Plan was for EGD, which was scheduled for next week, but then on some outpatient labs last week he was found to have acute worsening of his renal failure with creatinine over 7, potassium was over 5.  EGD was canceled.  He was sent to the emergency department and seen by renal.  He had a fistula created with insertion of dialysis catheter with vascular yesterday.  He received his first session of dialysis last evening.  We have been asked to see him to consider inpatient EGD.  He also had development of rapid atrial fibrillation earlier in his stay here, but converted back to normal sinus rhythm.  He is now on heparin drip.  Plan is for Eliquis upon discharge.  Tells me that he still has some intermittent burning upper abdominal pain, but is not as severe as it had been.  He is on pantoprazole 40 mg twice daily here and it looks like he was just on once daily at home.  Has some anemia, which is stable this morning.  No evidence of GI bleeding and is heme negative. ? ?Colonoscopy 11/2019: ? ?- Two 2 to 9 mm polyps in the descending colon and in the ascending colon, removed with a cold  snare. Resected and retrieved.  Tubular adenomas. ?- Diverticulosis in the sigmoid colon. ?- The examination was otherwise normal on direct and retroflexion views. ? ? ?Past Medical History:  ?Diagnosis Date  ? Anxiety   ? Cancer [facility])   ? bladder  cancer- stage 1- removed 2007  ? Chronic kidney disease   ? CKD   ? Depression   ? Hypertension   ? ? ?Past Surgical History:  ?Procedure Laterality Date  ? AV FISTULA PLACEMENT Right 07/08/2021  ? Procedure: RIGHT ARM FISTULA  CREATION;  Surgeon: [name], MD;  Location: [facility] OR;  Service: Vascular;  Laterality: Right;  ? BLADDER SURGERY    ? COLONOSCOPY    ? INSERTION OF DIALYSIS CATHETER N/A 07/08/2021  ? Procedure: INSERTION OF DIALYSIS CATHETER;  Surgeon: [name], MD;  Location: [facility];  Service: Vascular;  Laterality: N/A;  ? JOINT REPLACEMENT  2016  ? knee right   ? POLYPECTOMY    ? ? ?Prior to Admission medications   ?Medication Sig Start Date End Date Taking? Authorizing Provider  ?amLODipine (NORVASC) 5 MG tablet TAKE 1 TABLET(5 MG) BY MOUTH DAILY 11/15/20  Yes Horald Pollen, MD  ?ASPIRIN 81 PO Take 1 tablet by mouth daily.   Yes [provider]  ?furosemide (LASIX) 40 MG tablet Take 40 mg by mouth daily. 06/02/21  Yes [provider]  ?lisinopril (ZESTRIL) 10 MG tablet TAKE 1 TABLET  BY MOUTH DAILY 11/15/20  Yes Horald Pollen, MD  ?Multiple Vitamin (MULTIVITAMIN) tablet Take 1 tablet by mouth daily.   Yes [provider]  ?Omega-3 Fatty Acids (FISH OIL PO) Take 500 mg by mouth.   Yes [provider]  ?pantoprazole (PROTONIX) 40 MG tablet TAKE 1 TABLET(40 MG) BY MOUTH DAILY 06/24/21  Yes Sagardia, Ines Bloomer, MD  ?PARoxetine (PAXIL) 40 MG tablet Take 40 mg by mouth daily. 06/14/21  Yes [provider]  ?tamsulosin (FLOMAX) 0.4 MG CAPS capsule Take 0.4 mg by mouth daily. 04/28/21  Yes [provider]  ?ALPRAZolam Duanne Moron) 0.5 MG tablet Take 1 tablet (0.5 mg total) by mouth 2 (two) times  daily as needed for anxiety. ?Patient not taking: Reported on 07/07/2021 09/10/17   Horald Pollen, MD  ? ? ?Current Facility-Administered Medications  ?Medication Dose Route Frequency Provider Last Rate Last Admin  ? Chlorhexidine Gluconate Cloth 2 % PADS 6 each  6 each Topical Q0600 Gabriel Earing, PA-C   6 each at 07/09/21 0551  ? [START ON 07/10/2021] ferric gluconate (FERRLECIT) 250 mg in sodium chloride 0.9 % 250 mL IVPB  250 mg Intravenous Q M,W,F-HD Dwana Melena, MD      ? heparin ADULT infusion 100 units/mL (25000 units/264m)  950 Units/hr Intravenous Continuous JDomenic Polite MD 9.5 mL/hr at 07/09/21 0659 950 Units/hr at 07/09/21 0659  ? metoprolol tartrate (LOPRESSOR) tablet 12.5 mg  12.5 mg Oral BID RGabriel Earing PA-C   12.5 mg at 07/09/21 02585 ? oxyCODONE-acetaminophen (PERCOCET/ROXICET) 5-325 MG per tablet 1 tablet  1 tablet Oral Q6H PRN Rhyne, Samantha J, PA-C      ? pantoprazole (PROTONIX) EC tablet 40 mg  40 mg Oral BID RGabriel Earing PA-C   40 mg at 07/09/21 02778 ? PARoxetine (PAXIL) tablet 40 mg  40 mg Oral Daily JDomenic Polite MD      ? sevelamer carbonate (RENVELA) tablet 1,600 mg  1,600 mg Oral TID WC Rhyne, Samantha J, PA-C   1,600 mg at 07/09/21 02423 ? tamsulosin (FLOMAX) capsule 0.4 mg  0.4 mg Oral Daily JDomenic Polite MD      ? ? ?Allergies as of 07/06/2021  ? (No Known Allergies)  ? ? ?Family History  ?Problem Relation Age of Onset  ? Cancer Mother   ? Cancer Father   ? Hypertension Father   ? Heart disease Father   ? Stomach cancer Father   ? Cancer Sister   ? Diabetes Sister   ? Heart disease Sister   ? Hypertension Sister   ? Mental illness Brother   ? Colon cancer Neg Hx   ? Colon polyps Neg Hx   ? Esophageal cancer Neg Hx   ? Rectal cancer Neg Hx   ? ? ?Social History  ? ?Socioeconomic History  ? Marital status: Married  ?  Spouse name: Not on file  ? Number of children: Not on file  ? Years of education: Not on file  ? Highest education level: Not on file   ?Occupational History  ? Not on file  ?Tobacco Use  ? Smoking status: Former  ?  Types: Cigarettes  ?  Quit date: 03/22/1972  ?  Years since quitting: 49.3  ? Smokeless tobacco: Never  ?Substance and Sexual Activity  ? Alcohol use: Yes  ?  Alcohol/week: 0.0 standard drinks  ?  Comment: rare  ? Drug use: No  ? Sexual activity: Not on file  ?  Other Topics Concern  ? Not on file  ?Social History Narrative  ? Not on file  ? ?Social Determinants of Health  ? ?Financial Resource Strain: Not on file  ?Food Insecurity: Not on file  ?Transportation Needs: Not on file  ?Physical Activity: Not on file  ?Stress: Not on file  ?Social Connections: Not on file  ?Intimate Partner Violence: Not on file  ? ? ?Review of Systems: ?ROS is O/W negative except as mentioned in HPI. ? ?Physical Exam: ?Vital signs in last 24 hours: ?Temp:  [97.5 ?F (36.4 ?C)-98.4 ?F (36.9 ?C)] 98.1 ?F (36.7 ?C) (04/20 0919) ?Pulse Rate:  [56-90] 76 (04/20 0919) ?Resp:  [11-20] 20 (04/20 0919) ?BP: (111-142)/(66-97) 136/74 (04/20 0919) ?SpO2:  [95 %-100 %] 96 % (04/20 0919) ?Weight:  [91.4 kg-93.5 kg] 91.4 kg (04/20 0339) ?Last BM Date : 07/07/21 ?General:  Alert, Well-developed, well-nourished, pleasant and cooperative in NAD ?Head:  Normocephalic and atraumatic. ?Eyes:  Sclera clear, no icterus.  Conjunctiva pink. ?Ears:  Normal auditory acuity. ?Mouth:  No deformity or lesions.   ?Lungs:  Clear throughout to auscultation.  No wheezes, crackles, or rhonchi. ?Heart:  Regular rate and rhythm; no murmurs, clicks, rubs, or gallops. ?Abdomen:  Soft, non-distended.  BS present.  Non-tender. ?Rectal:  Was heme negative. ?Msk:  Symmetrical without gross deformities. ?Pulses:  Normal pulses noted. ?Extremities:  Without clubbing or edema. ?Neurologic:  Alert and oriented x 4;  grossly normal neurologically. ?Skin:  Intact without significant lesions or rashes. ?Psych:  Alert and cooperative. Normal mood and affect. ? ?Intake/Output from previous day: ?04/19 0701 -  04/20 0700 ?In: 2671 [P.O.:350; I.V.:622; IV Piggyback:350] ?Out: 3185 [Urine:1150; Blood:30] ? ?Lab Results: ?Recent Labs  ?  07/07/21 ?1118 07/08/21 ?0223 07/09/21 ?0227  ?WBC 7.0 7.5 8.3  ?HGB 10.0* 9.1* 9.8

## 2021-07-09 NOTE — Progress Notes (Signed)
ANTICOAGULATION CONSULT NOTE ? ?Pharmacy Consult for Heparin ?Indication: atrial fibrillation ? ?No Known Allergies ? ?Patient Measurements: ?Height: '5\' 9"'$  (175.3 cm) ?Weight: 91.5 kg (201 lb 11.5 oz) ?IBW/kg (Calculated) : 70.7 ?Heparin Dosing Weight: 90kg ? ?Vital Signs: ?Temp: 97.6 ?F (36.4 ?C) (04/20 1754) ?Temp Source: Oral (04/20 1754) ?BP: 114/68 (04/20 1754) ?Pulse Rate: 72 (04/20 1754) ? ?Labs: ?Recent Labs  ?  07/07/21 ?0212 07/07/21 ?0354 07/07/21 ?0354 07/07/21 ?1118 07/07/21 ?2204 07/08/21 ?0223 07/09/21 ?0227 07/09/21 ?1134 07/09/21 ?1903  ?HGB  --   --    < > 10.0*  --  9.1* 9.8*  --   --   ?HCT  --   --   --  30.5*  --  28.1* 28.8*  --   --   ?PLT  --   --   --  321  --  272 313  --   --   ?HEPARINUNFRC  --   --   --   --    < > 0.75* 0.92* 0.60 0.59  ?CREATININE  --  7.31*   < > 7.28*  --  7.54* 4.69*  --   --   ?TROPONINIHS 28* 31*  --   --   --   --   --   --   --   ? < > = values in this interval not displayed.  ? ? ? ?Estimated Creatinine Clearance: 16.4 mL/min (A) (by C-G formula based on SCr of 4.69 mg/dL (H)). ? ? ?Medical History: ?Past Medical History:  ?Diagnosis Date  ? Anxiety   ? Cancer [facility])   ? bladder  cancer- stage 1- removed 2007  ? Chronic kidney disease   ? CKD   ? Depression   ? Hypertension   ? ?Assessment: ?65 yom presenting with abdominal discomfort x 6 months, EGD deferred due to worsening renal function. Patient found to be in afib with RVR. Pharmacy consulted to dose heparin for Afib.  ? ?Heparin level of 0.59 this evening is therapeutic on heparin 950 units/hr. CBC stable. No bleeding issues reported. ? ?Goal of Therapy:  ?Heparin level 0.3-0.7 units/ml ?Monitor platelets by anticoagulation protocol: Yes ?  ?Plan:  ?Continue heparin 950 units/hr  ?Stop heparin at 0800 on 4/21 per GI for EGD ?Monitor daily heparin level, CBC and s/s of bleeding  ?Follow up transition to oral anticoagulant as appropriate ? ? ?Arturo Morton, PharmD, BCPS ?Please check AMION for all [place] contact numbers ?Clinical Pharmacist ?07/09/2021 7:45 PM ?

## 2021-07-09 NOTE — Progress Notes (Signed)
Pt has been accepted at [facility] on TTS schedule. Pt can start on Saturday. Pt will need to arrive at 11:30 for first appt to complete paperwork prior to treatment. Pt has a 12:10 chair time. Met with pt at bedside to discuss above arrangements. Pt voices understanding and agreeable to plan. Update provided to attending, nephrologist, and RN CM. Out-pt HD arrangements added to pt's AVS. Contacted renal PA regarding clinic's need for orders.  ?Will assist as needed.  ? ?Melven Sartorius ?Renal Navigator ?[phone] ?

## 2021-07-09 NOTE — Progress Notes (Signed)
ANTICOAGULATION CONSULT NOTE - Initial Consult ? ?Pharmacy Consult for Heparin ?Indication: atrial fibrillation ? ?No Known Allergies ? ?Patient Measurements: ?Height: '5\' 9"'$  (175.3 cm) ?Weight: 91.4 kg (201 lb 8 oz) ?IBW/kg (Calculated) : 70.7 ?Heparin Dosing Weight: 90kg ? ?Vital Signs: ?Temp: 97.9 ?F (36.6 ?C) (04/20 1134) ?Temp Source: Oral (04/20 1134) ?BP: 136/74 (04/20 0919) ?Pulse Rate: 76 (04/20 0919) ? ?Labs: ?Recent Labs  ?  07/07/21 ?0212 07/07/21 ?0354 07/07/21 ?1118 07/07/21 ?2204 07/08/21 ?0223 07/09/21 ?0227 07/09/21 ?3875  ?HGB  --   --  10.0*  --  9.1* 9.8*  --   ?HCT  --   --  30.5*  --  28.1* 28.8*  --   ?PLT  --   --  321  --  272 313  --   ?HEPARINUNFRC  --   --   --    < > 0.75* 0.92* 0.60  ?CREATININE  --  7.31* 7.28*  --  7.54* 4.69*  --   ?TROPONINIHS 28* 31*  --   --   --   --   --   ? < > = values in this interval not displayed.  ? ? ?Estimated Creatinine Clearance: 16.4 mL/min (A) (by C-G formula based on SCr of 4.69 mg/dL (H)). ? ? ?Medical History: ?Past Medical History:  ?Diagnosis Date  ? Anxiety   ? Cancer [facility])   ? bladder  cancer- stage 1- removed 2007  ? Chronic kidney disease   ? CKD   ? Depression   ? Hypertension   ? ?Assessment: ?22 yom presenting with abdominal discomfort x 6 months, EGD deferred due to worsening renal function. Patient found to be in afib with RVR. Pharmacy consulted to dose heparin for Afib.  ? ?Heparin level of 0.6 is therapeutic on heparin 950 units/hr. Hgb 9.8. Plt wnl. No bleeding noted.  ? ?Goal of Therapy:  ?Heparin level 0.3-0.7 units/ml ?Monitor platelets by anticoagulation protocol: Yes ?  ?Plan:  ?Continue heparin 950 units/hr  ?Check 8 hr confirmatory heparin level  ?Stop heparin at 0800 on 4/21 per GI for EGD ?Monitor heparin level, CBC and s/s of bleeding  ?Follow up transition to oral anticoagulant  ? ?Cristela Felt, PharmD, BCPS ?Clinical Pharmacist ?07/09/2021 12:28 PM ? ? ? ?

## 2021-07-09 NOTE — Progress Notes (Signed)
?PROGRESS NOTE ? ? ? ?Christopher [name]  VOH:[id number] DOB: [birth year] DOA: 07/06/2021 ?PCP: Horald Pollen, MD  ?Narrative: 70/M with history of hypertension, CKD 4, ongoing work-up for abdominal pain was sent to the ED after he was found to have worsening creatinine, hyperkalemia. ?-In addition has been having abdominal discomfort for 6 months, had a ultrasound which noted gallbladder stones without cholecystitis, he saw a gastroenterologist Dr. Henrene Pastor who was planning to do an EGD but this was deferred in the setting of worsening renal function. ?-He is followed by Dr. Moshe Cipro with CKD, on Lasix at baseline. ?-In the ED he was noted to be in A-fib RVR, creatinine of 7.2, potassium of 4.6, admitted with AKI on CKD 4, new onset A-fib with RVR ? ?Subjective: ?-Feels okay overall, reports anorexia, no nausea vomiting no dyspnea today ? ?Assessment and Plan: ? ?AKI on CKD 4  ?-Secondary to progressive kidney disease  ?-Last creatinine was 5.5 on 3/14, trended up to 7.5 this admission ?-Renal ultrasound negative for hydronephrosis, 5.5 cm cyst in the right kidney  ?-Urine output is good, nephrology following, right IJ HD catheter and aVF placed yesterday by vascular, started hemodialysis 4/19 ?-CLIP for Outpt HD pending ? ?A-fib with RVR ?-New onset, converted to sinus rhythm, continue metoprolol ?-Echo with preserved EF, grade 1 DD, no significant valvular disease, mild to moderate left atrial dilation, TSH is normal ?-Continue IV heparin now, switch to Eliquis pending below work-up ? ?Abd pain, Intermittent dyspepsia, GERD symptoms ?-Continue PPI ?-Ongoing work-up by GI, Dr. Henrene Pastor, abdominal ultrasound in March noted cholelithiasis without cholecystitis, EGD was planned 4/14 but cancelled in the setting of worsening renal failure ?-d/w Gi regarding EGD considering logistical issues with ESRD now and anticoagulation ?-Continue PPI, there could be some overlap of symptoms from CKD5 too ?  ?History of  transitional cell bladder cancer ?-Surgery in 2007 ?-Ultrasound unremarkable this admission ? ?Hypertension ?-Lisinopril discontinued ? ?Acute on chronic anemia ?-Anemia panel with severe iron deficiency, started on IV iron yesterday ?-Pending GI work-up, see discussion above ? ? ?DVT prophylaxis: Heparin. ?Code Status: Full code. ?Family Communication: Discussed with patient in detail, no family at bedside ?Disposition Plan: Home in 48 hours if stable ? ?Consultants: Nephrology, gastroenterology ? ? ?Procedures:  ? ?Antimicrobials:  ? ? ?Objective: ?Vitals:  ? 07/09/21 0010 07/09/21 6948 07/09/21 0756 07/09/21 0919  ?BP: (!) 142/85 114/69 130/81 136/74  ?Pulse: 90 72 70 76  ?Resp: '18 16 20 20  '$ ?Temp: 98.3 ?F (36.8 ?C) 97.9 ?F (36.6 ?C) 97.8 ?F (36.6 ?C) 98.1 ?F (36.7 ?C)  ?TempSrc: Oral Oral Oral Oral  ?SpO2: 98% 98% 95% 96%  ?Weight:  91.4 kg    ?Height:      ? ? ?Intake/Output Summary (Last 24 hours) at 07/09/2021 1100 ?Last data filed at 07/09/2021 5462 ?Gross per 24 hour  ?Intake 1222 ml  ?Output 3185 ml  ?Net -1963 ml  ? ?Filed Weights  ? 07/08/21 2020 07/08/21 2345 07/09/21 0339  ?Weight: 93.5 kg 91.5 kg 91.4 kg  ? ? ?Examination: ? ?General exam: Pleasant male sitting up in bed, AAOx3, no distress ?HEENT: Positive JVD, right IJ HD catheter ?CVS: S1-S2, regular rate rhythm ?Lungs: Clear bilaterally ?Abdomen: Soft, nontender, bowel sounds present ?Extremities: Right arm aVF  ?Skin: No rashes ?Psychiatry:  Mood & affect appropriate.  ? ? ? ?Data Reviewed:  ? ?CBC: ?Recent Labs  ?Lab 07/06/21 ?1600 07/07/21 ?1118 07/08/21 ?0223 07/09/21 ?0227  ?WBC 6.8 7.0 7.5 8.3  ?NEUTROABS  --  4.7 4.4  --   ?HGB 10.5* 10.0* 9.1* 9.8*  ?HCT 33.0* 30.5* 28.1* 28.8*  ?MCV 87.8 87.4 86.7 84.0  ?PLT 333 321 272 313  ? ?Basic Metabolic Panel: ?Recent Labs  ?Lab 07/03/21 ?1123 07/06/21 ?1600 07/07/21 ?0212 07/07/21 ?0354 07/07/21 ?1118 07/08/21 ?0223 07/09/21 ?8144  ?NA 140 140  --   --  142 138 136  ?K 5.7* 4.6  --   --  4.8 4.2 4.1   ?CL 105 106  --   --  108 107 100  ?CO2 26 23  --   --  '24 22 25  '$ ?GLUCOSE 85 96  --   --  102* 96 205*  ?BUN 69* 69*  --   --  76* 77* 39*  ?CREATININE 7.01* 7.25*  --  7.31* 7.28* 7.54* 4.69*  ?CALCIUM 9.3 9.1  --   --  9.1 8.8* 8.8*  ?MG  --   --  2.0  --   --   --   --   ?PHOS  --   --  6.4*  --  6.9* 6.4*  --   ? ?GFR: ?Estimated Creatinine Clearance: 16.4 mL/min (A) (by C-G formula based on SCr of 4.69 mg/dL (H)). ?Liver Function Tests: ?Recent Labs  ?Lab 07/07/21 ?0212 07/07/21 ?1118 07/08/21 ?0223 07/09/21 ?0227  ?AST 12* 12*  --  16  ?ALT 8 8  --  9  ?ALKPHOS 97 83  --  83  ?BILITOT 0.5 0.4  --  0.6  ?PROT 6.6 6.1*  --  6.4*  ?ALBUMIN 3.3* 3.0* 2.8* 3.0*  ? ?No results for input(s): LIPASE, AMYLASE in the last 168 hours. ?No results for input(s): AMMONIA in the last 168 hours. ?Coagulation Profile: ?No results for input(s): INR, PROTIME in the last 168 hours. ?Cardiac Enzymes: ?No results for input(s): CKTOTAL, CKMB, CKMBINDEX, TROPONINI in the last 168 hours. ?BNP (last 3 results) ?No results for input(s): PROBNP in the last 8760 hours. ?HbA1C: ?No results for input(s): HGBA1C in the last 72 hours. ?CBG: ?No results for input(s): GLUCAP in the last 168 hours. ?Lipid Profile: ?No results for input(s): CHOL, HDL, LDLCALC, TRIG, CHOLHDL, LDLDIRECT in the last 72 hours. ?Thyroid Function Tests: ?Recent Labs  ?  07/07/21 ?0212  ?TSH 4.185  ? ?Anemia Panel: ?Recent Labs  ?  07/07/21 ?0212  ?VITAMINB12 299  ?FOLATE 16.5  ?FERRITIN 98  ?TIBC 279  ?IRON 18*  ?RETICCTPCT 1.2  ? ?Urine analysis: ?   ?Component Value Date/Time  ? COLORURINE YELLOW 07/06/2021 1513  ? APPEARANCEUR CLEAR 07/06/2021 1513  ? LABSPEC 1.014 07/06/2021 1513  ? PHURINE 5.0 07/06/2021 1513  ? GLUCOSEU NEGATIVE 07/06/2021 1513  ? HGBUR NEGATIVE 07/06/2021 1513  ? BILIRUBINUR NEGATIVE 07/06/2021 1513  ? BILIRUBINUR neg 02/06/2013 1332  ? KETONESUR NEGATIVE 07/06/2021 1513  ? PROTEINUR 100 (A) 07/06/2021 1513  ? UROBILINOGEN 0.2 02/06/2013 1332   ? NITRITE NEGATIVE 07/06/2021 1513  ? LEUKOCYTESUR NEGATIVE 07/06/2021 1513  ? ?Sepsis Labs: ?'@LABRCNTIP'$ (procalcitonin:4,lacticidven:4) ? ?)No results found for this or any previous visit (from the past 240 hour(s)).  ? ?Radiology Studies: ?DG Chest Port 1 View ? ?Result Date: 07/08/2021 ?CLINICAL DATA:  Calcis catheter placement. EXAM: PORTABLE CHEST 1 VIEW COMPARISON:  07/06/2021. FINDINGS: Right IJ dialysis catheter tip is in the low SVC. Heart size minimal streaky atelectasis or scarring in the lung bases. No airspace consolidation or pleural fluid. No pneumothorax. IMPRESSION: Minimal streaky bibasilar atelectasis/scarring. Electronically Signed   By: Lorin Picket M.D.  On: 07/08/2021 13:41  ? ?DG C-Arm 1-60 Min ? ?Result Date: 07/08/2021 ?CLINICAL DATA:  Dialysis catheter EXAM: DG C-ARM 1-60 MIN CONTRAST:  Insertion in the operating room.  None FLUOROSCOPY: 4 seconds (0.6 mGy) COMPARISON:  None. FINDINGS: A single spot fluoroscopic image of the right upper chest is provided for review and demonstrate sequela of a presumed right jugular approach hemodialysis catheter with tip overlying the expected location of the superior aspect the right atrium. No definite pneumothorax given technique. IMPRESSION: Post right jugular approach dialysis catheter placement without evidence of complication on this intraoperative fluoroscopic image. Electronically Signed   By: Sandi Mariscal M.D.   On: 07/08/2021 12:07  ? ?ECHOCARDIOGRAM COMPLETE ? ?Result Date: 07/07/2021 ?   ECHOCARDIOGRAM REPORT   Patient Name:   KAYDE WAREHIME [facility] Date of Exam: 07/07/2021 Medical Rec #:  [id number]          Height:       69.0 in Accession #:    [id number]         Weight:       211.6 lb Date of Birth:  1950/07/01          BSA:          2.116 m? Patient Age:    27 years           BP:           124/98 mmHg Patient Gender: M                  HR:           79 bpm. Exam Location:  Inpatient Procedure: 2D Echo Indications:    Atrial fibrillation   History:        Patient has no prior history of Echocardiogram examinations.                 Risk Factors:Hypertension.  Sonographer:    Arlyss Gandy Referring Phys: [place]  Sonographer Comments:

## 2021-07-09 NOTE — Progress Notes (Signed)
? ? ?  Subjective  - POD #1 ? ?No complaints this morning.  He denies any pain in his right hand ? ? ?Physical Exam: ? ?Excellent thrill within right brachiocephalic fistula.  Palpable right radial pulse. ? ? ? ? ? ? ?Assessment/Plan:  POD #1 ? ?Right brachiocephalic fistula with excellent thrill.  He will follow-up in the office in 6 weeks for further evaluation with a duplex.  He has no evidence of steal syndrome.  I will sign off.  Please contact us for any additional concerns ? ?Christopher [name] ?07/09/2021 ?7:50 AM ?-- ? ?Vitals:  ? 07/09/21 0010 07/09/21 0339  ?BP: (!) 142/85 114/69  ?Pulse: 90 72  ?Resp: 18 16  ?Temp: 98.3 ?F (36.8 ?C) 97.9 ?F (36.6 ?C)  ?SpO2: 98% 98%  ? ? ?Intake/Output Summary (Last 24 hours) at 07/09/2021 0750 ?Last data filed at 07/09/2021 9244 ?Gross per 24 hour  ?Intake 1322 ml  ?Output 3185 ml  ?Net -1863 ml  ? ? ? ?Laboratory ?CBC ?   ?Component Value Date/Time  ? WBC 8.3 07/09/2021 0227  ? HGB 9.8 (L) 07/09/2021 0227  ? HGB 14.7 10/11/2018 1037  ? HCT 28.8 (L) 07/09/2021 0227  ? HCT 45.1 10/11/2018 1037  ? PLT 313 07/09/2021 0227  ? PLT 282 10/11/2018 1037  ? ? ?BMET ?   ?Component Value Date/Time  ? NA 136 07/09/2021 0227  ? NA 138 10/11/2018 1037  ? K 4.1 07/09/2021 0227  ? CL 100 07/09/2021 0227  ? CO2 25 07/09/2021 0227  ? GLUCOSE 205 (H) 07/09/2021 0227  ? BUN 39 (H) 07/09/2021 0227  ? BUN 32 (H) 10/11/2018 1037  ? CREATININE 4.69 (H) 07/09/2021 0227  ? CREATININE 1.84 (H) 06/25/2015 1756  ? CALCIUM 8.8 (L) 07/09/2021 0227  ? GFRNONAA 13 (L) 07/09/2021 0227  ? GFRNONAA 48 (L) 05/15/2014 0943  ? GFRAA 31 (L) 10/11/2018 1037  ? GFRAA 56 (L) 05/15/2014 0943  ? ? ?COAG ?No results found for: INR, PROTIME ?No results found for: PTT ? ?Antibiotics ?Anti-infectives (From admission, onward)  ? ? Start     Dose/Rate Route Frequency Ordered Stop  ? 07/08/21 1330  cefUROXime (ZINACEF) 1.5 g in sodium chloride 0.9 % 100 mL IVPB       ? 1.5 g ?200 mL/hr over 30 Minutes Intravenous To Surgery  07/08/21 0427 07/08/21 1051  ? 07/08/21 0600  cefUROXime (ZINACEF) 1.5 g in sodium chloride 0.9 % 100 mL IVPB  Status:  Discontinued       ? 1.5 g ?200 mL/hr over 30 Minutes Intravenous To Surgery 07/07/21 1242 07/08/21 0427  ? ?  ? ? ? ?V. Leia Alf, M.D., FACS ?[facility] ?Office: [phone] ?Pager:  [phone]  ?

## 2021-07-09 NOTE — Progress Notes (Addendum)
? [facility] ?Progress Note  ? ?Patient is a 71 year old male with a past medical history of hypertension and transitional cell carcinoma of the bladder. He initially presented with abdominal pain and worsening labs, found to develop new onset atrial fibrillation in RVR.  ? 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 Per chart review, baseline appears to be around 2.4-2.8. Cr 2.75 in July 2022. Cr 4.69 from 7.54 yesterday. Follows up with Dr. Moshe Cipro outpatient and has had progressive worsening of renal function. Cr was already 5.57 on 06/02/21 with small kidneys noted on u/s with a bland sediment on urine microscopy. Renal US notable for no hydronephrosis in the right kidney while left kidney unable to be visualized with 5.5 cm cyst in the upper pole of the right kidney. Prior renal US in June 2021 showed potential atrophy with smaller cysts that were less concerning. AKI likely secondary to prerenal etiology given hypertension with possible intrinsic component given presence of cysts although less likely given simple cystic structures. Had first HD session last night.  ?-Renally dose medications given GFR <15. ?-Avoid nephrotoxic agents.  ?-Continue HD, session later this afternoon in prep for possible d/c tomorrow if center is found. ?-lasix 80 mg bid only on non dialysis days upon d/c to help with volume; eventually he will have much less UOP and at that time lasix can be d/c. ?-monitor I/Os ?-Compression stockings  ?-CLIP ?-Appreciate  VVS taking pt to OR 4/19 for rt BCF (already augmenting nicely with great thrill) + TC to initiate dialysis. ? -Awaiting HD center prior to discharge home; prefers [facility] if possible. Renal navigator working on finding a center.  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 ?Normocytic anemia: Stable. Hgb 9.8 with MCV 84 today, etiology likely secondary to anemia of chronic disease and iron deficiency  anemia.  Iron deficient -> will load with Ferrlecit when he is on dialysis + likely will need ESA during this hospitalization but will monitor closely. Did not receive the Ferrlecit yest. ?History of transitional cell carcinoma of bladder: Noted in 2007, s/p surgery without neoadjuvant chemotherapy.  ?Atrial fibrillation with RVR: new onset, most recently not in RVR with HR within normal limits. Monitor HR and continue cardiac telemetry. Continue metoprolol.  ?Recent History of Hyperkalemia: K 4.1 most recently. Will continue to monitor, holding lisinopril.   ?Hypertension: Continue to hold home lisinopril given worsening renal function, monitor BP per primary team. ? ?Subjective:   ?No acute overnight events. Denies chest pain, dyspnea or worsened leg swelling. Tolerated 1st HD session completed late last night.   ? ?Objective:   ?BP 130/81 (BP Location: Left Arm)   Pulse 70   Temp 97.8 ?F (36.6 ?C) (Oral)   Resp 20   Ht '5\' 9"'$  (1.753 m)   Wt 91.4 kg   SpO2 95%   BMI 29.76 kg/m?  ? ?Intake/Output Summary (Last 24 hours) at 07/09/2021 0815 ?Last data filed at 07/09/2021 3846 ?Gross per 24 hour  ?Intake 1322 ml  ?Output 3185 ml  ?Net -1863 ml  ? ?Weight change: -0.759 kg ? ?Physical Exam: ?General: Patient sitting upright in bed, in no acute distress. ?CV: irregularly irregular rhythm, normal rate, no murmurs or gallops auscultated ?Resp: CTAB, no wheezing, rales or rhonchi noted ?GI: soft, nontender to palpation, nondistended, presence of bowel sounds ?Ext: 1+ LE edema to the knees, TED hose in place, distal pulses strong and equal bilaterally  ?Access:  RIJ TC, rt bcf strong thrill and augments ? ?Imaging: ?DG Chest Port 1 View ? ?Result Date: 07/08/2021 ?CLINICAL DATA:  Calcis catheter placement. EXAM: PORTABLE CHEST 1 VIEW COMPARISON:  07/06/2021. FINDINGS: Right IJ dialysis catheter tip is in the low SVC. Heart size minimal streaky atelectasis or scarring in the lung bases. No airspace consolidation or pleural  fluid. No pneumothorax. IMPRESSION: Minimal streaky bibasilar atelectasis/scarring. Electronically Signed   By: Lorin Picket M.D.   On: 07/08/2021 13:41  ? ?DG C-Arm 1-60 Min ? ?Result Date: 07/08/2021 ?CLINICAL DATA:  Dialysis catheter EXAM: DG C-ARM 1-60 MIN CONTRAST:  Insertion in the operating room.  None FLUOROSCOPY: 4 seconds (0.6 mGy) COMPARISON:  None. FINDINGS: A single spot fluoroscopic image of the right upper chest is provided for review and demonstrate sequela of a presumed right jugular approach hemodialysis catheter with tip overlying the expected location of the superior aspect the right atrium. No definite pneumothorax given technique. IMPRESSION: Post right jugular approach dialysis catheter placement without evidence of complication on this intraoperative fluoroscopic image. Electronically Signed   By: Sandi Mariscal M.D.   On: 07/08/2021 12:07  ? ?ECHOCARDIOGRAM COMPLETE ? ?Result Date: 07/07/2021 ?   ECHOCARDIOGRAM REPORT   Patient Name:   Christopher [name] [facility] Date of Exam: 07/07/2021 Medical Rec #:  [id number]          Height:       69.0 in Accession #:    [id number]         Weight:       211.6 lb Date of Birth:  1950-08-29          BSA:          2.116 m? Patient Age:    83 years           BP:           124/98 mmHg Patient Gender: M                  HR:           79 bpm. Exam Location:  Inpatient Procedure: 2D Echo Indications:    Atrial fibrillation  History:        Patient has no prior history of Echocardiogram examinations.                 Risk Factors:Hypertension.  Sonographer:    Arlyss Gandy Referring Phys: [place]  Sonographer Comments: Image acquisition challenging due to patient body habitus. IMPRESSIONS  1. Left ventricular ejection fraction, by estimation, is 60 to 65%. The left ventricle has normal function. The left ventricle has no regional wall motion abnormalities. Left ventricular diastolic parameters are consistent with Grade I diastolic dysfunction (impaired  relaxation).  2. Right ventricular systolic function is normal. The right ventricular size is normal.  3. Left atrial size was mild to moderately dilated.  4. The mitral valve is grossly normal. Mild mitral valve regurgitation. No evidence of mitral stenosis.  5. The aortic valve is tricuspid. Aortic valve regurgitation is mild. No aortic stenosis is present. FINDINGS  Left Ventricle: Left ventricular ejection fraction, by estimation, is 60 to 65%. The left ventricle has normal function. The left ventricle has no regional wall motion abnormalities. The left ventricular internal cavity size was normal in size. There is  no left ventricular hypertrophy. Left ventricular diastolic parameters are consistent with Grade I diastolic dysfunction (impaired relaxation). Right Ventricle: The right ventricular size is normal. No increase in right ventricular  wall thickness. Right ventricular systolic function is normal. Left Atrium: Left atrial size was mild to moderately dilated. Right Atrium: Right atrial size was normal in size. Pericardium: There is no evidence of pericardial effusion. Mitral Valve: The mitral valve is grossly normal. Mild mitral valve regurgitation. No evidence of mitral valve stenosis. Tricuspid Valve: The tricuspid valve is grossly normal. Tricuspid valve regurgitation is mild . No evidence of tricuspid stenosis. Aortic Valve: The aortic valve is tricuspid. Aortic valve regurgitation is mild. Aortic regurgitation PHT measures 757 msec. No aortic stenosis is present. Aortic valve mean gradient measures 3.5 mmHg. Aortic valve peak gradient measures 7.1 mmHg. Aortic  valve area, by VTI measures 3.50 cm?. Pulmonic Valve: The pulmonic valve was grossly normal. Pulmonic valve regurgitation is not visualized. No evidence of pulmonic stenosis. Aorta: The aortic root and ascending aorta are structurally normal, with no evidence of dilitation. Venous: The inferior vena cava was not well visualized. IAS/Shunts: The  atrial septum is grossly normal.  LEFT VENTRICLE PLAX 2D LVIDd:         3.70 cm   Diastology LVIDs:         2.80 cm   LV e' medial:    4.03 cm/s LV PW:         0.90 cm   LV E/e' medial:  12.0 LV IVS:        1.20 cm

## 2021-07-09 NOTE — Progress Notes (Signed)
Rounded on patient today in correlation to transition to outpatient HD. Kidney Failure book given. Patient educated at the bedside regarding care of tunneled dialysis catheter, AV fistula care, assessment of thrill daily and proper medication administration on HD days.  Patient also educated on the importance of adhering to scheduled dialysis treatments, the effects of fluid overload, hyperkalemia and hyperphosphatemia. Patient capable of re-verbalizing via teach back method. Also educated patient on services available through the interdisciplinary team in the clinic setting. Patient reports that he is adamant about ensuring that he follows his diet restrictions and is glad to have this information. Also placed dietitian consult regarding diet education. Patient lives at home with wife. Patient with no further questions at this time. Handouts and contact information provided to patient for any further assistance. Will follow as appropriate. ?

## 2021-07-09 NOTE — Progress Notes (Signed)
Heparin level came back therapeutic again tonight. We will cont with the current until 8 AM then hold for EGD. ? ?Onnie Boer, PharmD, BCIDP, AAHIVP, CPP ?Infectious Disease Pharmacist ?07/09/2021 7:45 PM ? ? ?

## 2021-07-09 NOTE — Progress Notes (Signed)
ANTICOAGULATION CONSULT NOTE  ?Pharmacy Consult for heparin ?Indication: atrial fibrillation ?Brief A/P: Heparin level supratherapeutic Decrease Heparin rate ? ?Labs: ?Recent Labs  ?  07/07/21 ?0212 07/07/21 ?0354 07/07/21 ?1118 07/07/21 ?2204 07/08/21 ?0223 07/09/21 ?0227  ?HGB  --   --  10.0*  --  9.1* 9.8*  ?HCT  --   --  30.5*  --  28.1* 28.8*  ?PLT  --   --  321  --  272 313  ?HEPARINUNFRC  --   --   --  0.57 0.75* 0.92*  ?CREATININE  --  7.31* 7.28*  --  7.54*  --   ?TROPONINIHS 28* 31*  --   --   --   --   ? ? ?Assessment:  ?71 y.o. male with Afib for heparin ? ?Plan: ?Decrease Heparin 950 units/hr ?Check heparin level in 8 hours. ? ?Phillis Knack, PharmD, BCPS ? ?

## 2021-07-09 NOTE — TOC Benefit Eligibility Note (Signed)
Patient Advocate Encounter ? ?Insurance verification completed.   ? ?The patient is currently admitted and upon discharge could be taking Eliquis 5 mg. ? ?The current 30 day co-pay is, $40.00.  ? ?The patient is insured through [employer] Part D  ? ? ? ?Miles Borkowski, CPhT ?Pharmacy Patient Advocate Specialist ?[facility] Patient Advocate Team ?Direct Number: [phone]  Fax: [phone] ? ? ? ? ? ?  ?

## 2021-07-09 NOTE — Progress Notes (Addendum)
Obtained the consent form and placed in pt's chart.   Clio Gerhart S Sonya Gunnoe, RN  

## 2021-07-09 NOTE — Progress Notes (Signed)
?  Transition of Care (TOC) Screening Note ? ? ?Patient Details  ?Name: Christopher [name] ?Date of Birth: 02-07-1951 ? ? ?Transition of Care (TOC) CM/SW Contact:    ?Dahlia Client, Romeo Rabon, RN ?Phone Number: ?07/09/2021, 3:34 PM ? ? ? ?Transition of Care Department [facility]) has reviewed patient and no TOC needs have been identified at this time. We will continue to monitor patient advancement through interdisciplinary progression rounds. If new patient transition needs arise, please place a TOC consult. ?  ?

## 2021-07-10 DIAGNOSIS — I4891 Unspecified atrial fibrillation: Secondary | ICD-10-CM | POA: Diagnosis not present

## 2021-07-10 DIAGNOSIS — R634 Abnormal weight loss: Secondary | ICD-10-CM

## 2021-07-10 DIAGNOSIS — N179 Acute kidney failure, unspecified: Secondary | ICD-10-CM | POA: Diagnosis not present

## 2021-07-10 DIAGNOSIS — R110 Nausea: Secondary | ICD-10-CM

## 2021-07-10 LAB — CBC
HCT: 28.3 % — ABNORMAL LOW (ref 39.0–52.0)
Hemoglobin: 9.4 g/dL — ABNORMAL LOW (ref 13.0–17.0)
MCH: 28.1 pg (ref 26.0–34.0)
MCHC: 33.2 g/dL (ref 30.0–36.0)
MCV: 84.5 fL (ref 80.0–100.0)
Platelets: 281 10*3/uL (ref 150–400)
RBC: 3.35 MIL/uL — ABNORMAL LOW (ref 4.22–5.81)
RDW: 14.8 % (ref 11.5–15.5)
WBC: 8.9 10*3/uL (ref 4.0–10.5)
nRBC: 0 % (ref 0.0–0.2)

## 2021-07-10 LAB — COMPREHENSIVE METABOLIC PANEL
ALT: 9 U/L (ref 0–44)
AST: 13 U/L — ABNORMAL LOW (ref 15–41)
Albumin: 3 g/dL — ABNORMAL LOW (ref 3.5–5.0)
Alkaline Phosphatase: 72 U/L (ref 38–126)
Anion gap: 9 (ref 5–15)
BUN: 32 mg/dL — ABNORMAL HIGH (ref 8–23)
CO2: 27 mmol/L (ref 22–32)
Calcium: 9 mg/dL (ref 8.9–10.3)
Chloride: 99 mmol/L (ref 98–111)
Creatinine, Ser: 4.29 mg/dL — ABNORMAL HIGH (ref 0.61–1.24)
GFR, Estimated: 14 mL/min — ABNORMAL LOW (ref >60)
Glucose, Bld: 99 mg/dL (ref 70–99)
Potassium: 4 mmol/L (ref 3.5–5.1)
Sodium: 135 mmol/L (ref 135–145)
Total Bilirubin: 0.4 mg/dL (ref 0.3–1.2)
Total Protein: 5.8 g/dL — ABNORMAL LOW (ref 6.5–8.1)

## 2021-07-10 LAB — HEPARIN LEVEL (UNFRACTIONATED)
Heparin Unfractionated: 0.27 IU/mL — ABNORMAL LOW (ref 0.30–0.70)
Heparin Unfractionated: 0.12 IU/mL — ABNORMAL LOW (ref 0.30–0.70)

## 2021-07-10 MED ORDER — SODIUM CHLORIDE 0.9 % IV SOLN
250.0000 mg | INTRAVENOUS | Status: DC
  Administered 2021-07-11: 250 mg via INTRAVENOUS
  Filled 2021-07-10 (×2): qty 20

## 2021-07-10 MED ORDER — DARBEPOETIN ALFA 60 MCG/0.3ML IJ SOSY
60.0000 ug | PREFILLED_SYRINGE | INTRAMUSCULAR | Status: DC
  Administered 2021-07-11: 60 ug via INTRAVENOUS
  Filled 2021-07-10 (×2): qty 0.3

## 2021-07-10 MED ORDER — HEPARIN (PORCINE) 25000 UT/250ML-% IV SOLN
1200.0000 [IU]/h | INTRAVENOUS | Status: AC
  Administered 2021-07-10: 1000 [IU]/h via INTRAVENOUS
  Filled 2021-07-10: qty 250

## 2021-07-10 NOTE — Progress Notes (Signed)
?PROGRESS NOTE ? ? ? ?Christopher [name]  BWG:[id number] DOB: 09-Jun-1950 DOA: 07/06/2021 ?PCP: Horald Pollen, MD  ?Narrative: 70/M with history of hypertension, CKD 4, ongoing work-up for abdominal pain was sent to the ED after he was found to have worsening creatinine, hyperkalemia. ?-In addition has been having abdominal discomfort for months to years, had a ultrasound which noted gallbladder stones without cholecystitis, he saw a gastroenterologist Dr. Henrene Pastor who was planning to do an EGD 4/14 but this had to be canceled when the labs noted hyperkalemia and worsening renal function . ?-He is followed by Dr. Moshe Cipro with CKD, on Lasix at baseline. ?-In the ED he was noted to be in A-fib RVR, creatinine of 7.2, potassium of 4.6, admitted with AKI on CKD 4, new onset A-fib with RVR ?-Admitted, started on hemodialysis, converted to sinus rhythm ?-Gastroenterology consulting as well, plan for endoscopy ? ?Subjective: ?-Feels okay overall, reports anorexia, no nausea vomiting no dyspnea today ? ?Assessment and Plan: ? ?AKI on CKD 4  ?-Secondary to progressive kidney disease  ?-Last creatinine was 5.5 on 3/14, trended up to 7.5 this admission ?-Renal ultrasound negative for hydronephrosis, 5.5 cm cyst in the right kidney  ?-Urine output is good, nephrology following, right IJ HD catheter and aVF placed 4/19 by vascular, started hemodialysis 4/19 ?-CLIP for Outpt HD completed ?-Discharge pending GI work-up ? ?A-fib with RVR ?-New onset, converted to sinus rhythm, continue metoprolol ?-Echo with preserved EF, grade 1 DD, no significant valvular disease, mild to moderate left atrial dilation, TSH is normal ?-chads2vasc score>3 ?-Continue IV heparin now, switch to Eliquis pending endoscopy ? ?Abd pain, Intermittent dyspepsia, GERD symptoms ?-Continue PPI ?-Ongoing work-up by GI, Dr. Henrene Pastor, abdominal ultrasound in March noted cholelithiasis without cholecystitis, EGD was planned 4/14 but cancelled in the setting of  worsening renal failure ?-d/w Gi regarding EGD considering logistical issues with ESRD now and anticoagulation ?-Continue PPI, there could be some overlap of symptoms from CKD5 too ?  ?History of transitional cell bladder cancer ?-Surgery in 2007 ?-Ultrasound unremarkable this admission ? ?Hypertension ?-Lisinopril discontinued ? ?Acute on chronic anemia ?-Anemia panel with severe iron deficiency, started on IV iron yesterday ?-Pending GI work-up, see discussion above ? ? ?DVT prophylaxis: IV heparin on hold for endoscopy ?Code Status: Full code. ?Family Communication: Discussed with patient in detail, no family at bedside ?Disposition Plan: Home tomorrow pending above work-up ? ?Consultants: Nephrology, gastroenterology ? ? ?Procedures:  ? ?Antimicrobials:  ? ? ?Objective: ?Vitals:  ? 07/10/21 0053 07/10/21 0424 07/10/21 1779 07/10/21 0956  ?BP: 110/75 106/64 121/65   ?Pulse: 65 (!) 55 (!) 59 (!) 53  ?Resp: '17 16 15   '$ ?Temp: 97.7 ?F (36.5 ?C) 97.8 ?F (36.6 ?C) (!) 97.4 ?F (36.3 ?C)   ?TempSrc: Oral Oral Oral   ?SpO2: 96% 95% 97%   ?Weight:  90.6 kg    ?Height:      ? ? ?Intake/Output Summary (Last 24 hours) at 07/10/2021 1055 ?Last data filed at 07/10/2021 0600 ?Gross per 24 hour  ?Intake 398.39 ml  ?Output 2800 ml  ?Net -2401.61 ml  ? ?Filed Weights  ? 07/09/21 1350 07/09/21 1706 07/10/21 0424  ?Weight: 93.6 kg 91.5 kg 90.6 kg  ? ? ?Examination: ? ?General exam: Pleasant male sitting up in bed, AAOx3, no distress ?HEENT: No JVD, right IJ HD catheter noted ?CVS: S1-S2, regular rate rhythm ?Lungs: Clear bilaterally ?Abdomen: Soft, nontender, bowel sounds present ?Extremities: Right arm aVF  ?Skin: No rashes ?Psychiatry:  Mood &  affect appropriate.  ? ? ? ?Data Reviewed:  ? ?CBC: ?Recent Labs  ?Lab 07/06/21 ?1600 07/07/21 ?1118 07/08/21 ?0223 07/09/21 ?0227 07/10/21 ?0202  ?WBC 6.8 7.0 7.5 8.3 8.9  ?NEUTROABS  --  4.7 4.4  --   --   ?HGB 10.5* 10.0* 9.1* 9.8* 9.4*  ?HCT 33.0* 30.5* 28.1* 28.8* 28.3*  ?MCV 87.8 87.4  86.7 84.0 84.5  ?PLT 333 321 272 313 281  ? ?Basic Metabolic Panel: ?Recent Labs  ?Lab 07/06/21 ?1600 07/07/21 ?0212 07/07/21 ?0354 07/07/21 ?1118 07/08/21 ?0223 07/09/21 ?4665 07/10/21 ?0202  ?NA 140  --   --  142 138 136 135  ?K 4.6  --   --  4.8 4.2 4.1 4.0  ?CL 106  --   --  108 107 100 99  ?CO2 23  --   --  '24 22 25 27  '$ ?GLUCOSE 96  --   --  102* 96 205* 99  ?BUN 69*  --   --  76* 77* 39* 32*  ?CREATININE 7.25*  --  7.31* 7.28* 7.54* 4.69* 4.29*  ?CALCIUM 9.1  --   --  9.1 8.8* 8.8* 9.0  ?MG  --  2.0  --   --   --   --   --   ?PHOS  --  6.4*  --  6.9* 6.4*  --   --   ? ?GFR: ?Estimated Creatinine Clearance: 17.8 mL/min (A) (by C-G formula based on SCr of 4.29 mg/dL (H)). ?Liver Function Tests: ?Recent Labs  ?Lab 07/07/21 ?0212 07/07/21 ?1118 07/08/21 ?0223 07/09/21 ?0227 07/10/21 ?0202  ?AST 12* 12*  --  16 13*  ?ALT 8 8  --  9 9  ?ALKPHOS 97 83  --  83 72  ?BILITOT 0.5 0.4  --  0.6 0.4  ?PROT 6.6 6.1*  --  6.4* 5.8*  ?ALBUMIN 3.3* 3.0* 2.8* 3.0* 3.0*  ? ?No results for input(s): LIPASE, AMYLASE in the last 168 hours. ?No results for input(s): AMMONIA in the last 168 hours. ?Coagulation Profile: ?No results for input(s): INR, PROTIME in the last 168 hours. ?Cardiac Enzymes: ?No results for input(s): CKTOTAL, CKMB, CKMBINDEX, TROPONINI in the last 168 hours. ?BNP (last 3 results) ?No results for input(s): PROBNP in the last 8760 hours. ?HbA1C: ?No results for input(s): HGBA1C in the last 72 hours. ?CBG: ?No results for input(s): GLUCAP in the last 168 hours. ?Lipid Profile: ?No results for input(s): CHOL, HDL, LDLCALC, TRIG, CHOLHDL, LDLDIRECT in the last 72 hours. ?Thyroid Function Tests: ?No results for input(s): TSH, T4TOTAL, FREET4, T3FREE, THYROIDAB in the last 72 hours. ? ?Anemia Panel: ?No results for input(s): VITAMINB12, FOLATE, FERRITIN, TIBC, IRON, RETICCTPCT in the last 72 hours. ? ?Urine analysis: ?   ?Component Value Date/Time  ? COLORURINE YELLOW 07/06/2021 1513  ? APPEARANCEUR CLEAR 07/06/2021  1513  ? LABSPEC 1.014 07/06/2021 1513  ? PHURINE 5.0 07/06/2021 1513  ? GLUCOSEU NEGATIVE 07/06/2021 1513  ? HGBUR NEGATIVE 07/06/2021 1513  ? BILIRUBINUR NEGATIVE 07/06/2021 1513  ? BILIRUBINUR neg 02/06/2013 1332  ? KETONESUR NEGATIVE 07/06/2021 1513  ? PROTEINUR 100 (A) 07/06/2021 1513  ? UROBILINOGEN 0.2 02/06/2013 1332  ? NITRITE NEGATIVE 07/06/2021 1513  ? LEUKOCYTESUR NEGATIVE 07/06/2021 1513  ? ?Sepsis Labs: ?'@LABRCNTIP'$ (procalcitonin:4,lacticidven:4) ? ?)No results found for this or any previous visit (from the past 240 hour(s)).  ? ?Radiology Studies: ?DG Chest Port 1 View ? ?Result Date: 07/08/2021 ?CLINICAL DATA:  Calcis catheter placement. EXAM: PORTABLE CHEST 1 VIEW COMPARISON:  07/06/2021.  FINDINGS: Right IJ dialysis catheter tip is in the low SVC. Heart size minimal streaky atelectasis or scarring in the lung bases. No airspace consolidation or pleural fluid. No pneumothorax. IMPRESSION: Minimal streaky bibasilar atelectasis/scarring. Electronically Signed   By: Lorin Picket M.D.   On: 07/08/2021 13:41  ? ?DG C-Arm 1-60 Min ? ?Result Date: 07/08/2021 ?CLINICAL DATA:  Dialysis catheter EXAM: DG C-ARM 1-60 MIN CONTRAST:  Insertion in the operating room.  None FLUOROSCOPY: 4 seconds (0.6 mGy) COMPARISON:  None. FINDINGS: A single spot fluoroscopic image of the right upper chest is provided for review and demonstrate sequela of a presumed right jugular approach hemodialysis catheter with tip overlying the expected location of the superior aspect the right atrium. No definite pneumothorax given technique. IMPRESSION: Post right jugular approach dialysis catheter placement without evidence of complication on this intraoperative fluoroscopic image. Electronically Signed   By: Sandi Mariscal M.D.   On: 07/08/2021 12:07   ? ? ?Scheduled Meds: ? Chlorhexidine Gluconate Cloth  6 each Topical Q0600  ? metoprolol tartrate  12.5 mg Oral BID  ? pantoprazole  40 mg Oral BID  ? PARoxetine  40 mg Oral Daily  ? sevelamer  carbonate  1,600 mg Oral TID WC  ? tamsulosin  0.4 mg Oral Daily  ? ?Continuous Infusions: ? sodium chloride    ? [START ON 07/11/2021] ferric gluconate (FERRLECIT) IVPB    ? heparin    ? ? ? LOS: 3 days

## 2021-07-10 NOTE — Progress Notes (Signed)
ANTICOAGULATION CONSULT NOTE ? ?Pharmacy Consult for Heparin ?Indication: atrial fibrillation ? ?No Known Allergies ? ?Patient Measurements: ?Height: '5\' 9"'$  (175.3 cm) ?Weight: 90.6 kg (199 lb 11.8 oz) ?IBW/kg (Calculated) : 70.7 ?Heparin Dosing Weight: 90kg ? ?Vital Signs: ?Temp: 97.4 ?F (36.3 ?C) (04/21 0940) ?Temp Source: Oral (04/21 0844) ?BP: 121/65 (04/21 0844) ?Pulse Rate: 53 (04/21 0956) ? ?Labs: ?Recent Labs  ?  07/08/21 ?0223 07/09/21 ?0227 07/09/21 ?1134 07/09/21 ?1903 07/10/21 ?0202  ?HGB 9.1* 9.8*  --   --  9.4*  ?HCT 28.1* 28.8*  --   --  28.3*  ?PLT 272 313  --   --  281  ?HEPARINUNFRC 0.75* 0.92* 0.60 0.59 0.27*  ?CREATININE 7.54* 4.69*  --   --  4.29*  ? ? ? ?Estimated Creatinine Clearance: 17.8 mL/min (A) (by C-G formula based on SCr of 4.29 mg/dL (H)). ? ? ?Medical History: ?Past Medical History:  ?Diagnosis Date  ? Anxiety   ? Cancer [facility])   ? bladder  cancer- stage 1- removed 2007  ? Chronic kidney disease   ? CKD   ? Depression   ? Hypertension   ? ?Assessment: ?72 yom presenting with abdominal discomfort x 6 months, EGD deferred due to worsening renal function. Patient found to be in afib with RVR. Pharmacy consulted to dose heparin for Afib.  ? ?Heparin level 0.27 this morning on heparin 950 units/hr, just below goal. CBC stable. No bleeding issues reported.  Heparin was turned off at 0800 this AM for anticipated EGD, but then procedure was moved to tomorrow.  Pharmacy asked to resume IV heparin. ? ?Goal of Therapy:  ?Heparin level 0.3-0.7 units/ml ?Monitor platelets by anticoagulation protocol: Yes ?  ?Plan:  ?Resume IV heparin at increased rate of 1000 units/hr ?Check heparin level 8 hrs after gtt starts. ?Daily heparin level and CBC. ?Per GI instructions, will turn off IV heparin tomorrow (4/22) at 0400 AM. ? ?[place] Crane, Pharm D, BCPS, BCCP ?Clinical Pharmacist ? 07/10/2021 11:03 AM  ? ?[facility] pharmacy phone numbers are listed on [email or web address] ? ?

## 2021-07-10 NOTE — Progress Notes (Addendum)
?[facility] ?Progress Note  ? ?Patient is a 71 year old male with a past medical history of hypertension and transitional cell carcinoma of the bladder. He initially presented with abdominal pain and worsening labs, found to develop new onset atrial fibrillation in RVR.  ? 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 Per chart review, baseline appears to be around 2.4-2.8. Cr 2.75 in July 2022. Cr most recently 4.69. Follows up with Dr. Moshe Cipro outpatient and has had progressive worsening of renal function. Cr was already 5.57 on 06/02/21 with small kidneys noted on u/s with a bland sediment on urine microscopy. Renal US notable for no hydronephrosis in the right kidney while left kidney unable to be visualized with 5.5 cm cyst in the upper pole of the right kidney. Prior renal US in June 2021 showed potential atrophy with smaller cysts that were less concerning. AKI likely secondary to prerenal etiology given hypertension with possible intrinsic component given presence of cysts although less likely given simple cystic structures. Last HD session 4/20.   ?-Renally dose medications for ESRD ?-Avoid nephrotoxic agents.  ?-lasix 80 mg bid only on non dialysis days upon d/c to help with volume; eventually he will have much less UOP and at that time lasix can be d/c. ?-monitor I/Os ?-Compression stockings  ?-CLIP ? -HD outpatient at [facility] 2nd shift 1030AM ?He's getting the EGD tomorrow AM so will put him on for 2nd shift tomorrow; if EGD not till later then can also d/c with next hd as outpt on Tuesday. 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 Will recheck in the AM ?Normocytic anemia: Stable. Hgb 9.4 with MCV 84.5 today, etiology likely secondary to anemia of chronic disease and iron deficiency anemia.  Iron deficient -> will load with Ferrlecit when he is on dialysis. Will need dose Aranesp for tomorrow as well. ?History of transitional  cell carcinoma of bladder: Noted in 2007, s/p surgery without neoadjuvant chemotherapy.  ?Atrial fibrillation with RVR: new onset, most recently not in RVR with HR within normal limits. Monitor HR. Continue metoprolol.  ?Hypertension: Most recently normotensive. Continue to hold home lisinopril given worsening renal function, monitor BP per primary team. ? ?Subjective:   ?No acute overnight events. EGD moved to Sat morning. Patient denies any concerns today.    ? ?Objective:   ?BP 106/64 (BP Location: Left Arm)   Pulse (!) 55   Temp 97.8 ?F (36.6 ?C) (Oral)   Resp 16   Ht '5\' 9"'$  (1.753 m)   Wt 90.6 kg   SpO2 95%   BMI 29.50 kg/m?  ? ?Intake/Output Summary (Last 24 hours) at 07/10/2021 0823 ?Last data filed at 07/10/2021 0600 ?Gross per 24 hour  ?Intake 398.39 ml  ?Output 2800 ml  ?Net -2401.61 ml  ? ?Weight change: 0.159 kg ? ?Physical Exam: ?General: Patient sitting upright in bed, in no acute distress. ?CV: rregularly irregular rhythm, no murmurs or gallops auscultated  ?Resp: CTAB, no wheezing, rales or rhonchi noted ?GI: soft, nontender to palpation, nondistended, presence of bowel sounds ?Ext: minimal pitting LE edema noted bilaterally, radial and distal pulses strong and equal bilaterally ?Neuro: alert and oriented, appropriately conversational  ?Access: RIJ TC and Rt BCF w/ thrill and good augmentation ? ?Imaging: ?DG Chest Port 1 View ? ?Result Date: 07/08/2021 ?CLINICAL DATA:  Calcis catheter placement. EXAM: PORTABLE CHEST 1 VIEW COMPARISON:  07/06/2021. FINDINGS: Right IJ dialysis catheter tip is in the low  SVC. Heart size minimal streaky atelectasis or scarring in the lung bases. No airspace consolidation or pleural fluid. No pneumothorax. IMPRESSION: Minimal streaky bibasilar atelectasis/scarring. Electronically Signed   By: Lorin Picket M.D.   On: 07/08/2021 13:41  ? ?DG C-Arm 1-60 Min ? ?Result Date: 07/08/2021 ?CLINICAL DATA:  Dialysis catheter EXAM: DG C-ARM 1-60 MIN CONTRAST:  Insertion in the  operating room.  None FLUOROSCOPY: 4 seconds (0.6 mGy) COMPARISON:  None. FINDINGS: A single spot fluoroscopic image of the right upper chest is provided for review and demonstrate sequela of a presumed right jugular approach hemodialysis catheter with tip overlying the expected location of the superior aspect the right atrium. No definite pneumothorax given technique. IMPRESSION: Post right jugular approach dialysis catheter placement without evidence of complication on this intraoperative fluoroscopic image. Electronically Signed   By: Sandi Mariscal M.D.   On: 07/08/2021 12:07   ? ?Labs: ?BMET ?Recent Labs  ?Lab 07/03/21 ?1123 07/06/21 ?1600 07/07/21 ?0212 07/07/21 ?0354 07/07/21 ?1118 07/08/21 ?0223 07/09/21 ?3838  ?NA 140 140  --   --  142 138 136  ?K 5.7* 4.6  --   --  4.8 4.2 4.1  ?CL 105 106  --   --  108 107 100  ?CO2 26 23  --   --  '24 22 25  '$ ?GLUCOSE 85 96  --   --  102* 96 205*  ?BUN 69* 69*  --   --  76* 77* 39*  ?CREATININE 7.01* 7.25*  --  7.31* 7.28* 7.54* 4.69*  ?CALCIUM 9.3 9.1  --   --  9.1 8.8* 8.8*  ?PHOS  --   --  6.4*  --  6.9* 6.4*  --   ? ?CBC ?Recent Labs  ?Lab 07/07/21 ?1118 07/08/21 ?0223 07/09/21 ?0227 07/10/21 ?0202  ?WBC 7.0 7.5 8.3 8.9  ?NEUTROABS 4.7 4.4  --   --   ?HGB 10.0* 9.1* 9.8* 9.4*  ?HCT 30.5* 28.1* 28.8* 28.3*  ?MCV 87.4 86.7 84.0 84.5  ?PLT 321 272 313 281  ? ? ?Medications:   ? ? Chlorhexidine Gluconate Cloth  6 each Topical Q0600  ? metoprolol tartrate  12.5 mg Oral BID  ? pantoprazole  40 mg Oral BID  ? PARoxetine  40 mg Oral Daily  ? sevelamer carbonate  1,600 mg Oral TID WC  ? tamsulosin  0.4 mg Oral Daily  ? ? ? ? ?Donney Dice, DO ?PGY-2, [facility]  ?07/10/2021, 8:23 AM   ?

## 2021-07-10 NOTE — Progress Notes (Signed)
? ? ? ?[facility] Progress Note ? ?CC:  Abdominal pain ? ?Subjective:  Feeling ok.  Tired today.  Says that his HR keeps dropping into the 40s when he goes to sleep. ? ?Objective:  ?Vital signs in last 24 hours: ?Temp:  [96.9 ?F (36.1 ?C)-98.1 ?F (36.7 ?C)] 97.4 ?F (36.3 ?C) (04/21 5102) ?Pulse Rate:  [53-72] 53 (04/21 0956) ?Resp:  [14-21] 15 (04/21 0844) ?BP: (106-159)/(64-79) 121/65 (04/21 0844) ?SpO2:  [94 %-100 %] 97 % (04/21 0844) ?Weight:  [90.6 kg-93.6 kg] 90.6 kg (04/21 0424) ?Last BM Date : 07/07/21 ?General:  Alert, Well-developed, in NAD ?Heart:  Regular rate and rhythm; no murmurs ?Pulm:  CTAB.  No W/R/R. ?Abdomen:  Soft, non-distended.  BS present.  Non-tender. ?Extremities:  Without edema. ?Neurologic:  Alert and oriented x 4;  grossly normal neurologically. ?Psych:  Alert and cooperative. Normal mood and affect. ? ?Intake/Output from previous day: ?04/20 0701 - 04/21 0700 ?In: 398.4 [P.O.:240; I.V.:158.4] ?Out: 2800 [Urine:800] ? ?Lab Results: ?Recent Labs  ?  07/08/21 ?0223 07/09/21 ?0227 07/10/21 ?0202  ?WBC 7.5 8.3 8.9  ?HGB 9.1* 9.8* 9.4*  ?HCT 28.1* 28.8* 28.3*  ?PLT 272 313 281  ? ?BMET ?Recent Labs  ?  07/08/21 ?0223 07/09/21 ?0227 07/10/21 ?0202  ?NA 138 136 135  ?K 4.2 4.1 4.0  ?CL 107 100 99  ?CO2 '22 25 27  '$ ?GLUCOSE 96 205* 99  ?BUN 77* 39* 32*  ?CREATININE 7.54* 4.69* 4.29*  ?CALCIUM 8.8* 8.8* 9.0  ? ?LFT ?Recent Labs  ?  07/10/21 ?0202  ?PROT 5.8*  ?ALBUMIN 3.0*  ?AST 13*  ?ALT 9  ?ALKPHOS 72  ?BILITOT 0.4  ? ?Hepatitis Panel ?Recent Labs  ?  07/07/21 ?2204 07/09/21 ?0227  ?HEPBSAG NON REACTIVE  --   ?HEPBIGM  --  NON REACTIVE  ? ? ?DG Chest Port 1 View ? ?Result Date: 07/08/2021 ?CLINICAL DATA:  Calcis catheter placement. EXAM: PORTABLE CHEST 1 VIEW COMPARISON:  07/06/2021. FINDINGS: Right IJ dialysis catheter tip is in the low SVC. Heart size minimal streaky atelectasis or scarring in the lung bases. No airspace consolidation or pleural fluid. No pneumothorax. IMPRESSION:  Minimal streaky bibasilar atelectasis/scarring. Electronically Signed   By: Lorin Picket M.D.   On: 07/08/2021 13:41  ? ?DG C-Arm 1-60 Min ? ?Result Date: 07/08/2021 ?CLINICAL DATA:  Dialysis catheter EXAM: DG C-ARM 1-60 MIN CONTRAST:  Insertion in the operating room.  None FLUOROSCOPY: 4 seconds (0.6 mGy) COMPARISON:  None. FINDINGS: A single spot fluoroscopic image of the right upper chest is provided for review and demonstrate sequela of a presumed right jugular approach hemodialysis catheter with tip overlying the expected location of the superior aspect the right atrium. No definite pneumothorax given technique. IMPRESSION: Post right jugular approach dialysis catheter placement without evidence of complication on this intraoperative fluoroscopic image. Electronically Signed   By: Sandi Mariscal M.D.   On: 07/08/2021 12:07   ? ?Assessment / Plan: ?*71 year old male who is in the process of being evaluated for intermittent upper abdominal discomfort with chronic decreased appetite and weight loss for years (the latter thought due to anxiety and depression).  Ultrasound with gallstones.  Plan was for EGD, but it was cancelled as outpatient due to worsening renal function and high potassium. ?*Acute worsening of CKD, now with ESRD:  Now on HD as of 4/19. ?*Atrial fibrillation with RVR:  This was new.  Converted to sinus rhythm but will need Eliquis as outpatient.  Currently on  heparin gtt. ?*Acute on chronic normocytic anemia:  Heme negative.  Likely due to CKD. ? ?-On pantoprazole 40 mg BID.  Continue for now. ?-EGD as inpatient, but unfortunately his procedure was supposed to be today, but is being bumped until tomorrow, 4/22, due to some more emergent cases that have come in.  I spoke with pharmacy and they are going to restart his heparin and hold it again at 4 AM tomorrow morning for 8 AM procedure. ? ? LOS: 3 days  ? ?Laban Emperor. Zohal Reny  07/10/2021, 10:23 AM ? ?  ?

## 2021-07-10 NOTE — H&P (View-Only) (Signed)
? ? ? ?[facility] Progress Note ? ?CC:  Abdominal pain ? ?Subjective:  Feeling ok.  Tired today.  Says that his HR keeps dropping into the 40s when he goes to sleep. ? ?Objective:  ?Vital signs in last 24 hours: ?Temp:  [96.9 ?F (36.1 ?C)-98.1 ?F (36.7 ?C)] 97.4 ?F (36.3 ?C) (04/21 1194) ?Pulse Rate:  [53-72] 53 (04/21 0956) ?Resp:  [14-21] 15 (04/21 0844) ?BP: (106-159)/(64-79) 121/65 (04/21 0844) ?SpO2:  [94 %-100 %] 97 % (04/21 0844) ?Weight:  [90.6 kg-93.6 kg] 90.6 kg (04/21 0424) ?Last BM Date : 07/07/21 ?General:  Alert, Well-developed, in NAD ?Heart:  Regular rate and rhythm; no murmurs ?Pulm:  CTAB.  No W/R/R. ?Abdomen:  Soft, non-distended.  BS present.  Non-tender. ?Extremities:  Without edema. ?Neurologic:  Alert and oriented x 4;  grossly normal neurologically. ?Psych:  Alert and cooperative. Normal mood and affect. ? ?Intake/Output from previous day: ?04/20 0701 - 04/21 0700 ?In: 398.4 [P.O.:240; I.V.:158.4] ?Out: 2800 [Urine:800] ? ?Lab Results: ?Recent Labs  ?  07/08/21 ?0223 07/09/21 ?0227 07/10/21 ?0202  ?WBC 7.5 8.3 8.9  ?HGB 9.1* 9.8* 9.4*  ?HCT 28.1* 28.8* 28.3*  ?PLT 272 313 281  ? ?BMET ?Recent Labs  ?  07/08/21 ?0223 07/09/21 ?0227 07/10/21 ?0202  ?NA 138 136 135  ?K 4.2 4.1 4.0  ?CL 107 100 99  ?CO2 '22 25 27  '$ ?GLUCOSE 96 205* 99  ?BUN 77* 39* 32*  ?CREATININE 7.54* 4.69* 4.29*  ?CALCIUM 8.8* 8.8* 9.0  ? ?LFT ?Recent Labs  ?  07/10/21 ?0202  ?PROT 5.8*  ?ALBUMIN 3.0*  ?AST 13*  ?ALT 9  ?ALKPHOS 72  ?BILITOT 0.4  ? ?Hepatitis Panel ?Recent Labs  ?  07/07/21 ?2204 07/09/21 ?0227  ?HEPBSAG NON REACTIVE  --   ?HEPBIGM  --  NON REACTIVE  ? ? ?DG Chest Port 1 View ? ?Result Date: 07/08/2021 ?CLINICAL DATA:  Calcis catheter placement. EXAM: PORTABLE CHEST 1 VIEW COMPARISON:  07/06/2021. FINDINGS: Right IJ dialysis catheter tip is in the low SVC. Heart size minimal streaky atelectasis or scarring in the lung bases. No airspace consolidation or pleural fluid. No pneumothorax. IMPRESSION:  Minimal streaky bibasilar atelectasis/scarring. Electronically Signed   By: Lorin Picket M.D.   On: 07/08/2021 13:41  ? ?DG C-Arm 1-60 Min ? ?Result Date: 07/08/2021 ?CLINICAL DATA:  Dialysis catheter EXAM: DG C-ARM 1-60 MIN CONTRAST:  Insertion in the operating room.  None FLUOROSCOPY: 4 seconds (0.6 mGy) COMPARISON:  None. FINDINGS: A single spot fluoroscopic image of the right upper chest is provided for review and demonstrate sequela of a presumed right jugular approach hemodialysis catheter with tip overlying the expected location of the superior aspect the right atrium. No definite pneumothorax given technique. IMPRESSION: Post right jugular approach dialysis catheter placement without evidence of complication on this intraoperative fluoroscopic image. Electronically Signed   By: Sandi Mariscal M.D.   On: 07/08/2021 12:07   ? ?Assessment / Plan: ?*71 year old male who is in the process of being evaluated for intermittent upper abdominal discomfort with chronic decreased appetite and weight loss for years (the latter thought due to anxiety and depression).  Ultrasound with gallstones.  Plan was for EGD, but it was cancelled as outpatient due to worsening renal function and high potassium. ?*Acute worsening of CKD, now with ESRD:  Now on HD as of 4/19. ?*Atrial fibrillation with RVR:  This was new.  Converted to sinus rhythm but will need Eliquis as outpatient.  Currently on  heparin gtt. ?*Acute on chronic normocytic anemia:  Heme negative.  Likely due to CKD. ? ?-On pantoprazole 40 mg BID.  Continue for now. ?-EGD as inpatient, but unfortunately his procedure was supposed to be today, but is being bumped until tomorrow, 4/22, due to some more emergent cases that have come in.  I spoke with pharmacy and they are going to restart his heparin and hold it again at 4 AM tomorrow morning for 8 AM procedure. ? ? LOS: 3 days  ? ?Christopher [name]. Christopher [name]  07/10/2021, 10:23 AM ? ?  ?

## 2021-07-10 NOTE — Care Management Important Message (Signed)
Important Message ? ?Patient Details  ?Name: Christopher [name] ?MRN: [id number] ?Date of Birth: 02-10-1951 ? ? ?Medicare Important Message Given:  Yes ? ? ? ? ?Shelda Altes ?07/10/2021, 9:44 AM ?

## 2021-07-10 NOTE — Progress Notes (Signed)
Nutrition Education Note ? ?RD consulted for Renal Education. Provided Renal handouts to patient. Reviewed food groups and provided written recommended serving sizes specifically determined for patient's current nutritional status.  ? ?Explained why diet restrictions are needed and provided lists of foods to limit/avoid that are high potassium, sodium, and phosphorus. Provided specific recommendations on safer alternatives of these foods. Strongly encouraged compliance of this diet.  ? ?Discussed importance of protein intake at each meal and snack. Provided examples of how to maximize protein intake throughout the day. Discussed need for fluid restriction with dialysis, importance of minimizing weight gain between HD treatments, and renal-friendly beverage options. ? ?Encouraged pt to discuss specific diet questions/concerns with RD at HD outpatient facility. Teach back method used. ? ?Expect good compliance. ? ?Body mass index is 29.5 kg/m?Marland Kitchen Pt meets criteria for overweight based on current BMI. ? ?Current diet order is Renal with 1200 ml fluid restriction, patient is consuming approximately 100% of meals at this time. Labs and medications reviewed. No further nutrition interventions warranted at this time. RD contact information provided. If additional nutrition issues arise, please re-consult RD. ? ?Lucas Mallow RD, LDN, CNSC ?Please refer to Amion for contact information.                                                       ? ? ?

## 2021-07-10 NOTE — Progress Notes (Signed)
Advised by renal MD that pt will not d/c today due to procedure being re-scheduled for tomorrow. Pt will start at [facility] on Tuesday. Spoke to pt via phone regarding start date of Tuesday. Pt aware he needs to arrive at 11:30. Contacted [facility] and spoke to [place]. Clinic aware pt will not start tomorrow but will start on Tuesday. Clinic received orders from renal PA yesterday.  ? ?Christopher [name] ?Renal Navigator ?[phone] ?

## 2021-07-10 NOTE — Progress Notes (Signed)
ANTICOAGULATION CONSULT NOTE ? ?Pharmacy Consult for Heparin ?Indication: atrial fibrillation ? ?No Known Allergies ? ?Patient Measurements: ?Height: '5\' 9"'$  (175.3 cm) ?Weight: 90.6 kg (199 lb 11.8 oz) ?IBW/kg (Calculated) : 70.7 ?Heparin Dosing Weight: 90kg ? ?Vital Signs: ?Temp: 97.7 ?F (36.5 ?C) (04/21 1705) ?Temp Source: Oral (04/21 1705) ?BP: 112/67 (04/21 1705) ?Pulse Rate: 56 (04/21 1705) ? ?Labs: ?Recent Labs  ?  07/08/21 ?0223 07/09/21 ?0227 07/09/21 ?1134 07/09/21 ?1903 07/10/21 ?0202 07/10/21 ?1847  ?HGB 9.1* 9.8*  --   --  9.4*  --   ?HCT 28.1* 28.8*  --   --  28.3*  --   ?PLT 272 313  --   --  281  --   ?HEPARINUNFRC 0.75* 0.92*   < > 0.59 0.27* 0.12*  ?CREATININE 7.54* 4.69*  --   --  4.29*  --   ? < > = values in this interval not displayed.  ? ? ? ?Estimated Creatinine Clearance: 17.8 mL/min (A) (by C-G formula based on SCr of 4.29 mg/dL (H)). ? ? ?Medical History: ?Past Medical History:  ?Diagnosis Date  ? Anxiety   ? Cancer [facility])   ? bladder  cancer- stage 1- removed 2007  ? Chronic kidney disease   ? CKD   ? Depression   ? Hypertension   ? ?Assessment: ?39 yom presenting with abdominal discomfort x 6 months, EGD deferred due to worsening renal function. Patient found to be in afib with RVR. Pharmacy consulted to dose heparin for Afib.  ? ?Heparin level 0.27 this morning on heparin 950 units/hr, just below goal. CBC stable. No bleeding issues reported.  Heparin was turned off at 0800 this AM for anticipated EGD, but then procedure was moved to tomorrow.  Pharmacy asked to resume IV heparin. ? ?Heparin level came back subtherapeutic tonight. We will increase the rate and still planning to stop at 0400 for EGD in AM. ?Goal of Therapy:  ?Heparin level 0.3-0.7 units/ml ?Monitor platelets by anticoagulation protocol: Yes ?  ?Plan:  ?Increase IV heparin to 1200 units/hr ?Daily heparin level and CBC. ?Per GI instructions, will turn off IV heparin tomorrow (4/22) at 0400 AM. ? ?Onnie Boer, PharmD, BCIDP,  AAHIVP, CPP ?Infectious Disease Pharmacist ?07/10/2021 7:30 PM ? ? ? ?

## 2021-07-11 ENCOUNTER — Encounter (HOSPITAL_COMMUNITY): Payer: Self-pay | Admitting: Internal Medicine

## 2021-07-11 ENCOUNTER — Inpatient Hospital Stay (HOSPITAL_COMMUNITY): Payer: Medicare PPO | Admitting: Anesthesiology

## 2021-07-11 ENCOUNTER — Encounter (HOSPITAL_COMMUNITY)
Admission: EM | Disposition: A | Discharge status: Home / Self Care | Payer: Self-pay | Source: Home / Self Care | Attending: Internal Medicine

## 2021-07-11 DIAGNOSIS — I120 Hypertensive chronic kidney disease with stage 5 chronic kidney disease or end stage renal disease: Secondary | ICD-10-CM

## 2021-07-11 DIAGNOSIS — K2970 Gastritis, unspecified, without bleeding: Secondary | ICD-10-CM

## 2021-07-11 DIAGNOSIS — Z992 Dependence on renal dialysis: Secondary | ICD-10-CM

## 2021-07-11 DIAGNOSIS — K3189 Other diseases of stomach and duodenum: Secondary | ICD-10-CM

## 2021-07-11 DIAGNOSIS — N186 End stage renal disease: Secondary | ICD-10-CM

## 2021-07-11 HISTORY — PX: ESOPHAGOGASTRODUODENOSCOPY (EGD) WITH PROPOFOL: SHX5813

## 2021-07-11 HISTORY — PX: BIOPSY: SHX5522

## 2021-07-11 LAB — CBC
HCT: 28.7 % — ABNORMAL LOW (ref 39.0–52.0)
Hemoglobin: 9.5 g/dL — ABNORMAL LOW (ref 13.0–17.0)
MCH: 28.4 pg (ref 26.0–34.0)
MCHC: 33.1 g/dL (ref 30.0–36.0)
MCV: 85.9 fL (ref 80.0–100.0)
Platelets: 287 10*3/uL (ref 150–400)
RBC: 3.34 MIL/uL — ABNORMAL LOW (ref 4.22–5.81)
RDW: 14.6 % (ref 11.5–15.5)
WBC: 7.2 10*3/uL (ref 4.0–10.5)
nRBC: 0 % (ref 0.0–0.2)

## 2021-07-11 LAB — BASIC METABOLIC PANEL
Anion gap: 11 (ref 5–15)
BUN: 51 mg/dL — ABNORMAL HIGH (ref 8–23)
CO2: 25 mmol/L (ref 22–32)
Calcium: 9.1 mg/dL (ref 8.9–10.3)
Chloride: 100 mmol/L (ref 98–111)
Creatinine, Ser: 5.73 mg/dL — ABNORMAL HIGH (ref 0.61–1.24)
GFR, Estimated: 10 mL/min — ABNORMAL LOW (ref >60)
Glucose, Bld: 102 mg/dL — ABNORMAL HIGH (ref 70–99)
Potassium: 3.9 mmol/L (ref 3.5–5.1)
Sodium: 136 mmol/L (ref 135–145)

## 2021-07-11 LAB — PHOSPHORUS: Phosphorus: 5 mg/dL — ABNORMAL HIGH (ref 2.5–4.6)

## 2021-07-11 SURGERY — ESOPHAGOGASTRODUODENOSCOPY (EGD) WITH PROPOFOL
Anesthesia: Monitor Anesthesia Care

## 2021-07-11 MED ORDER — PROPOFOL 10 MG/ML IV BOLUS
INTRAVENOUS | Status: DC | PRN
Start: 2021-07-11 — End: 2021-07-11
  Administered 2021-07-11: 40 mg via INTRAVENOUS
  Administered 2021-07-11: 20 mg via INTRAVENOUS
  Administered 2021-07-11: 40 mg via INTRAVENOUS
  Administered 2021-07-11: 20 mg via INTRAVENOUS
  Administered 2021-07-11 (×2): 40 mg via INTRAVENOUS

## 2021-07-11 MED ORDER — LIDOCAINE HCL (PF) 1 % IJ SOLN: 5.0000 mL | INTRAMUSCULAR | Status: DC | PRN

## 2021-07-11 MED ORDER — HEPARIN SODIUM (PORCINE) 1000 UNIT/ML DIALYSIS
1000.0000 [IU] | INTRAMUSCULAR | Status: DC | PRN
  Filled 2021-07-11: qty 1

## 2021-07-11 MED ORDER — FUROSEMIDE 80 MG PO TABS: ORAL_TABLET | ORAL | 1 refills | Status: DC

## 2021-07-11 MED ORDER — EPHEDRINE SULFATE-NACL 50-0.9 MG/10ML-% IV SOSY
PREFILLED_SYRINGE | INTRAVENOUS | Status: DC | PRN
  Administered 2021-07-11: 5 mg via INTRAVENOUS
  Administered 2021-07-11 (×2): 10 mg via INTRAVENOUS

## 2021-07-11 MED ORDER — ALTEPLASE 2 MG IJ SOLR: 2.0000 mg | Freq: Once | INTRAMUSCULAR | Status: DC | PRN

## 2021-07-11 MED ORDER — SODIUM CHLORIDE 0.9 % IV SOLN: 100.0000 mL | INTRAVENOUS | Status: DC | PRN

## 2021-07-11 MED ORDER — APIXABAN 5 MG PO TABS
5.0000 mg | ORAL_TABLET | Freq: Two times a day (BID) | ORAL | Status: DC
Start: 2021-07-11 — End: 2021-07-12
  Administered 2021-07-11: 5 mg via ORAL
  Filled 2021-07-11: qty 1

## 2021-07-11 MED ORDER — METOPROLOL SUCCINATE ER 25 MG PO TB24: 25.0000 mg | ORAL_TABLET | Freq: Every day | ORAL | 11 refills | Status: DC

## 2021-07-11 MED ORDER — PENTAFLUOROPROP-TETRAFLUOROETH EX AERO: 1.0000 "application " | INHALATION_SPRAY | CUTANEOUS | Status: DC | PRN

## 2021-07-11 MED ORDER — LIDOCAINE-PRILOCAINE 2.5-2.5 % EX CREA: 1.0000 "application " | TOPICAL_CREAM | CUTANEOUS | Status: DC | PRN

## 2021-07-11 MED ORDER — PROPOFOL 500 MG/50ML IV EMUL
INTRAVENOUS | Status: DC | PRN
  Administered 2021-07-11: 100 ug/kg/min via INTRAVENOUS

## 2021-07-11 MED ORDER — APIXABAN 5 MG PO TABS
5.0000 mg | ORAL_TABLET | Freq: Two times a day (BID) | ORAL | 1 refills | Status: DC
Start: 2021-07-11 — End: 2021-09-16

## 2021-07-11 MED ORDER — LIDOCAINE 2% (20 MG/ML) 5 ML SYRINGE
INTRAMUSCULAR | Status: DC | PRN
  Administered 2021-07-11: 50 mg via INTRAVENOUS

## 2021-07-11 SURGICAL SUPPLY — 15 items

## 2021-07-11 NOTE — Anesthesia Postprocedure Evaluation (Signed)
Anesthesia Post Note ? ?Patient: Christopher [name] ? ?Procedure(s) Performed: ESOPHAGOGASTRODUODENOSCOPY (EGD) WITH PROPOFOL ?BIOPSY ? ?  ? ?Patient location during evaluation: Endoscopy ?Anesthesia Type: MAC ?Level of consciousness: awake ?Pain management: pain level controlled ?Vital Signs Assessment: post-procedure vital signs reviewed and stable ?Respiratory status: spontaneous breathing, nonlabored ventilation, respiratory function stable and patient connected to nasal cannula oxygen ?Cardiovascular status: stable and blood pressure returned to baseline ?Postop Assessment: no apparent nausea or vomiting ?Anesthetic complications: no ? ? ?No notable events documented. ? ?Last Vitals:  ?Vitals:  ? 07/11/21 1430 07/11/21 1500  ?BP: 98/64 94/64  ?Pulse: 63 (!) 55  ?Resp: 19 18  ?Temp:    ?SpO2:    ?  ?Last Pain:  ?Vitals:  ? 07/11/21 1217  ?TempSrc: Oral  ?PainSc:   ? ? ?  ?  ?  ?  ?  ?  ? ?Annaelle Kasel P Anastacio Bua ? ? ? ? ?

## 2021-07-11 NOTE — Discharge Summary (Signed)
Christopher [name] QQV:[id number] DOB: 06-07-1950 DOA: 07/06/2021 ? ?PCP: Horald Pollen, MD ? ?Admit date: 07/06/2021 ?Discharge date: 07/11/2021 ? ?Time spent: 40 minutes ? ?Recommendations for Outpatient Follow-up:  ?PCP f/u ?Establish w/ cardiology ?Outpatient nephrology and vascular surgery f/u ?F/u results of EGD biopsies  ? ? ? ?Discharge Diagnoses:  ?Principal Problem: ?  ARF (acute renal failure) ([place]) ?Active Problems: ?  HTN (hypertension) ?  Transitional cell carcinoma of bladder 2007 ?  Atrial fibrillation with RVR ([place]) ?  Nausea without vomiting ?  Loss of weight ?  Gastritis and gastroduodenitis ? ? ?Discharge Condition: stable ? ?Diet recommendation: low sodium heart healthy ? ?Filed Weights  ? 07/10/21 0424 07/11/21 0334 07/11/21 1223  ?Weight: 90.6 kg 91.6 kg 89.2 kg  ? ? ?History of present illness:  ?Christopher [name] is a 71 y.o. male with history of hypertension, chronic kidney disease stage IV last creatinine in July 2022 was 2.7 in our system, abdominal pain presents to the ER after patient was found to have abnormal labs concerning for worsening renal function and hyperkalemia.  Patient has been having abdominal discomfort for the last 6 months and has had abdominal sonogram last month which did show gallbladder stones but no cholecystitis had followed up with gastroenterologist Dr.  Henrene Pastor is planning to do EGD but due to his worsening renal function was deferred.  Patient states over the last few weeks his appetite has been poor and has been a persistent upper abdominal pain.  Denies any diarrhea or vomiting.  Has been having nausea.  Denies any chest pain or shortness of breath. ?  ?  ?Patient will last 3 months has been following with nephrologist Dr. Moshe Cipro and was placed on Lasix.  Despite which patient's lower extremity edema has been worsening ? ?Hospital Course:  ?Patient presented with CKI on advanced CKD. 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 Tunneled  dialysis catheter and creation of brachicephalic fistula performed on 4/19. Hemodilaysis initiated, last received 4/22, plan to continue as outpatient, first visit 4/25. Will need to f/u with nephrology and vascular surgery. Also found to have new paroxysmal a fib with rvr, currently in sinus rhythm. Started on BB and DOAC, will need cardiology f/u (referred). TSH wnl, TTE without significant valvular disease, normal EF. Patient also evaluated for chronic abdominal pain with EGD on 4/22 that found gastropathy, otherwise unremarkable. Biopsies obtained, results pending at time of discharge.  ? ?Procedures: ?Placement of right IJ tunneled dialysis catheter on 4/19 and creation of right brachiocephalic fistula on 3/32, both with vascular surgery ?EGD 4/22 ? ?Consultations: ?Nephrology, GI, vascular surgery ? ?Discharge Exam: ?Vitals:  ? 07/11/21 1230 07/11/21 1300  ?BP: (!) 97/59 (!) 99/56  ?Pulse: 63 64  ?Resp: 18 14  ?Temp:    ?SpO2:    ? ? ?General: NAD ?Cardiovascular: RRR ?Respiratory: CTAB ? ?Discharge Instructions ? ? ?Discharge Instructions   ? ? Ambulatory referral to Cardiology   Complete by: As directed ?  ? Diet - low sodium heart healthy   Complete by: As directed ?  ? Increase activity slowly   Complete by: As directed ?  ? Leave dressing on - Keep it clean, dry, and intact until clinic visit   Complete by: As directed ?  ? ?  ? ?Allergies as of 07/11/2021   ?No Known Allergies ?  ? ?  ?Medication List  ?  ? ?STOP taking these medications   ? ?ALPRAZolam 0.5 MG tablet ?Commonly known as:  Xanax ?  ?amLODipine 5 MG tablet ?Commonly known as: NORVASC ?  ?ASPIRIN 81 PO ?  ?lisinopril 10 MG tablet ?Commonly known as: ZESTRIL ?  ? ?  ? ?TAKE these medications   ? ?apixaban 5 MG Tabs tablet ?Commonly known as: ELIQUIS ?Take 1 tablet (5 mg total) by mouth 2 (two) times daily. ?  ?FISH OIL PO ?Take 500 mg by mouth. ?  ?furosemide 80 MG tablet ?Commonly known as: LASIX ?Twice a day on non-dialysis days ?What  changed:  ?medication strength ?how much to take ?how to take this ?when to take this ?additional instructions ?  ?metoprolol succinate 25 MG 24 hr tablet ?Commonly known as: Toprol XL ?Take 1 tablet (25 mg total) by mouth daily. ?  ?multivitamin tablet ?Take 1 tablet by mouth daily. ?  ?pantoprazole 40 MG tablet ?Commonly known as: PROTONIX ?TAKE 1 TABLET(40 MG) BY MOUTH DAILY ?  ?PARoxetine 40 MG tablet ?Commonly known as: PAXIL ?Take 40 mg by mouth daily. ?  ?tamsulosin 0.4 MG Caps capsule ?Commonly known as: FLOMAX ?Take 0.4 mg by mouth daily. ?  ? ?  ? ?  ?  ? ? ?  ?Discharge Care Instructions  ?(From admission, onward)  ?  ? ? ?  ? ?  Start     Ordered  ? 07/11/21 0000  Leave dressing on - Keep it clean, dry, and intact until clinic visit       ? 07/11/21 1333  ? ?  ?  ? ?  ? ?No Known Allergies ? Follow-up Information   ? ? [facility] Follow up in 6 week(s).   ?Specialty: Vascular Surgery ?Why: Office will call you to arrange your appt (sent) ?Contact information: ?[address] ?[place] ?[phone] ? ?  ?  ? ? [place], [place] on 07/14/2021.   ?Why: Schedule is Tuesday,Thursday, Saturday with 12:10 chair time. ?Patient can start on Tuesday, April 25. Patient will need to arrive at 11:30 to complete paperwork prior to treatment. ?Contact information: ?[address] ?[place] [address] ?[phone] ? ? ?  ?  ? ?  ?  ? ?  ? ? ? ?The results of significant diagnostics from this hospitalization (including imaging, microbiology, ancillary and laboratory) are listed below for reference.   ? ?Significant Diagnostic Studies: ?US RENAL ? ?Result Date: 07/07/2021 ?CLINICAL DATA:  Renal failure. EXAM: RENAL / URINARY TRACT ULTRASOUND COMPLETE COMPARISON:  Abdominal ultrasound 06/05/2021 FINDINGS: Right Kidney: Renal measurements: 8.7 x 4.1 x 3.7 cm = volume: 70 mL. 5.5 cm cystic lesion identified upper pole right kidney. 3 cm  cystic lesion identified towards the interpolar region. No hydronephrosis. Left Kidney: Left kidney could not be visualized. Bladder: Appears normal for degree of bladder distention. Other: None. IMPRESSION: 1. No hydronephrosis in the right kidney. Left kidney could not be visualized. 2. Similar appearance 5.5 cm simple appearing cyst in the upper pole right kidney with smaller interpolar cystic lesion evident. Electronically Signed   By: Misty Stanley M.D.   On: 07/07/2021 07:08  ? ?DG Chest Port 1 View ? ?Result Date: 07/08/2021 ?CLINICAL DATA:  Calcis catheter placement. EXAM: PORTABLE CHEST 1 VIEW COMPARISON:  07/06/2021. FINDINGS: Right IJ dialysis catheter tip is in the low SVC. Heart size minimal streaky atelectasis or scarring in the lung bases. No airspace consolidation or pleural fluid. No pneumothorax. IMPRESSION: Minimal streaky bibasilar atelectasis/scarring. Electronically Signed   By: Lorin Picket M.D.  On: 07/08/2021 13:41  ? ?DG Chest Portable 1 View ? ?Result Date: 07/06/2021 ?CLINICAL DATA:  Pulmonary edema EXAM: PORTABLE CHEST 1 VIEW COMPARISON:  08/03/2008 FINDINGS: Mild bibasilar atelectasis. Lungs are otherwise clear. No pneumothorax or pleural effusion. Cardiac size within normal limits. Pulmonary vascularity is normal. No acute bone abnormality. IMPRESSION: No active disease. Electronically Signed   By: Fidela Salisbury M.D.   On: 07/06/2021 23:26  ? ?DG C-Arm 1-60 Min ? ?Result Date: 07/08/2021 ?CLINICAL DATA:  Dialysis catheter EXAM: DG C-ARM 1-60 MIN CONTRAST:  Insertion in the operating room.  None FLUOROSCOPY: 4 seconds (0.6 mGy) COMPARISON:  None. FINDINGS: A single spot fluoroscopic image of the right upper chest is provided for review and demonstrate sequela of a presumed right jugular approach hemodialysis catheter with tip overlying the expected location of the superior aspect the right atrium. No definite pneumothorax given technique. IMPRESSION: Post right jugular approach  dialysis catheter placement without evidence of complication on this intraoperative fluoroscopic image. Electronically Signed   By: Sandi Mariscal M.D.   On: 07/08/2021 12:07  ? ?ECHOCARDIOGRAM COMPLETE ? ?Result D

## 2021-07-11 NOTE — Op Note (Signed)
[facility] ?Patient Name: Christopher [name] ?Procedure Date : 07/11/2021 ?MRN: [id number] ?Attending MD: [name] MD, MD ?Date of Birth: Apr 11, 1950 ?CSN: [id number] ?Age: 71 ?Admit Type: Inpatient ?Procedure:                Upper GI endoscopy ?Indications:              Upper abdominal pain, Anorexia ?Providers:                [name] MD, MD, Grace Isaac, RN, Primitivo Gauze  ?                          [place], Technician, [employer],  ?                          Technician ?Referring MD:              ?Medicines:                Monitored Anesthesia Care ?Complications:            No immediate complications. ?Estimated Blood Loss:     Estimated blood loss was minimal. ?Procedure:                Pre-Anesthesia Assessment: ?                          - Prior to the procedure, a History and Physical  ?                          was performed, and patient medications and  ?                          allergies were reviewed. The patient's tolerance of  ?                          previous anesthesia was also reviewed. The risks  ?                          and benefits of the procedure and the sedation  ?                          options and risks were discussed with the patient.  ?                          All questions were answered, and informed consent  ?                          was obtained. Prior Anticoagulants: The patient has  ?                          taken no previous anticoagulant or antiplatelet  ?                          agents. ASA Grade Assessment: III - A patient with  ?  severe systemic disease. After reviewing the risks  ?                          and benefits, the patient was deemed in  ?                          satisfactory condition to undergo the procedure. ?                          After obtaining informed consent, the endoscope was  ?                          passed under direct vision. Throughout the  ?                          procedure, the  patient's blood pressure, pulse, and  ?                          oxygen saturations were monitored continuously. The  ?                          GIF-[id number] ([id number]) Olympus endoscope was introduced  ?                          through the mouth, and advanced to the third part  ?                          of duodenum. The upper GI endoscopy was  ?                          accomplished without difficulty. The patient  ?                          tolerated the procedure well. ?Scope In: ?Scope Out: ?Findings: ?     The examined esophagus was normal. ?     Patchy very mildly erythematous mucosa without bleeding was found in the  ?     gastric body and in the gastric antrum. Biopsies were taken from the  ?     antrum, body, and fundus with a cold forceps for histology. Estimated  ?     blood loss was minimal. ?     The examined duodenum was normal. ?Impression:               - Normal esophagus. ?                          - Mild gastropathy. Biopsied. ?                          - Normal examined duodenum. ?Recommendation:           - Return patient to hospital ward for ongoing care. ?                          - Advance diet as tolerated. ?                          -  Continue present medications. ?                          - Continue pantoprazole 40 mg BID. ?                          - Avoid NSAIDs as able. ?                          - Await pathology results. ?                          - No additional inpatient GI evaluation planned. ?                          - I will arrange for outpatient GI follow-up with  ?                          Dr. Henrene Pastor. ?                          Results discussed with the patient's wife by phone.  ?                          All questions answered to her satisfaction. ?Procedure Code(s):        --- Professional --- ?                          [phone], Esophagogastroduodenoscopy, flexible,  ?                          transoral; with biopsy, single or multiple ?Diagnosis Code(s):        --- Professional  --- ?                          K31.89, Other diseases of stomach and duodenum ?                          R10.10, Upper abdominal pain, unspecified ?                          R63.0, Anorexia ?CPT copyright 2019 American Medical Association. All rights reserved. ?The codes documented in this report are preliminary and upon coder review may  ?be revised to meet current compliance requirements. ?[name] MD, MD ?07/11/2021 8:52:17 AM ?This report has been signed electronically. ?Number of Addenda: 0 ?

## 2021-07-11 NOTE — Progress Notes (Signed)
?[facility] ?Progress Note  ? ?Patient is a 71 year old male with a past medical history of hypertension and transitional cell carcinoma of the bladder. He initially presented with abdominal pain and worsening labs, found to develop new onset atrial fibrillation in RVR.  ? 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 Per chart review, baseline appears to be around 2.4-2.8. Cr 2.75 in July 2022. Cr most recently 4.69. Follows up with Dr. Moshe Cipro outpatient and has had progressive worsening of renal function. Cr was already 5.57 on 06/02/21 with small kidneys noted on u/s with a bland sediment on urine microscopy. Renal US notable for no hydronephrosis in the right kidney while left kidney unable to be visualized with 5.5 cm cyst in the upper pole of the right kidney. Prior renal US in June 2021 showed potential atrophy with smaller cysts that were less concerning. AKI likely secondary to prerenal etiology given hypertension with possible intrinsic component given presence of cysts although less likely given simple cystic structures. Last HD session 4/20.   ?-lasix 80 mg bid only on non dialysis days upon d/c to help with volume; eventually he will have much less UOP and at that time lasix can be d/c. ?-monitor I/Os ?-Compression stockings  ?-HD outpatient at [facility] 2nd shift 1030AM ?He's getting the EGD this AM so will put him on for 2nd shift. ? 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 Repeat P 4/22 5 ?Normocytic anemia: Stable. Hgb 9.4 with MCV 84.5 today, etiology likely secondary to anemia of chronic disease and iron deficiency anemia.  Iron deficient -> will load with Ferrlecit when he is on dialysis (received 1 dose, 2nd '250mg'$  dose today w/ dialysis). Will need dose Aranesp 60 for today. ?History of transitional cell carcinoma of bladder: Noted in 2007, s/p surgery without neoadjuvant chemotherapy.  ?Atrial fibrillation with RVR: new  onset, most recently not in RVR with HR within normal limits. on amio. Monitor HR. Continue metoprolol.  ?Hypertension: Most recently normotensive. Continue to hold home lisinopril given worsening renal function, monitor BP per primary team. ? ?Subjective:   ?No acute overnight events. EGD moved to Sat morning. Patient denies any concerns today.    ? ?Objective:   ?BP 131/75 (BP Location: Left Arm)   Pulse 62   Temp 98 ?F (36.7 ?C) (Oral)   Resp (!) 22   Ht '5\' 9"'$  (1.753 m)   Wt 91.6 kg   SpO2 95%   BMI 29.82 kg/m?  ? ?Intake/Output Summary (Last 24 hours) at 07/11/2021 0720 ?Last data filed at 07/11/2021 0408 ?Gross per 24 hour  ?Intake 465.5 ml  ?Output 1050 ml  ?Net -584.5 ml  ? ?Weight change: -2 kg ? ?Physical Exam: ?General: Patient sitting upright in bed, in no acute distress. ?CV: rregularly irregular rhythm, no murmurs or gallops auscultated  ?Resp: CTAB, no wheezing, rales or rhonchi noted ?GI: soft, nontender to palpation, nondistended, presence of bowel sounds ?Ext: minimal pitting LE edema noted bilaterally, radial and distal pulses strong and equal bilaterally ?Neuro: alert and oriented, appropriately conversational  ?Access: RIJ TC and Rt BCF w/ thrill and good augmentation ? ?Imaging: ?No results found. ? ?Labs: ?BMET ?Recent Labs  ?Lab 07/06/21 ?1600 07/07/21 ?0212 07/07/21 ?0354 07/07/21 ?1118 07/08/21 ?0223 07/09/21 ?6503 07/10/21 ?0202 07/11/21 ?0144  ?NA 140  --   --  142 138 136 135 136  ?K 4.6  --   --  4.8 4.2  4.1 4.0 3.9  ?CL 106  --   --  108 107 100 99 100  ?CO2 23  --   --  '24 22 25 27 25  '$ ?GLUCOSE 96  --   --  102* 96 205* 99 102*  ?BUN 69*  --   --  76* 77* 39* 32* 51*  ?CREATININE 7.25*  --  7.31* 7.28* 7.54* 4.69* 4.29* 5.73*  ?CALCIUM 9.1  --   --  9.1 8.8* 8.8* 9.0 9.1  ?PHOS  --  6.4*  --  6.9* 6.4*  --   --  5.0*  ? ?CBC ?Recent Labs  ?Lab 07/07/21 ?1118 07/08/21 ?0223 07/09/21 ?0227 07/10/21 ?0202 07/11/21 ?0144  ?WBC 7.0 7.5 8.3 8.9 7.2  ?NEUTROABS 4.7 4.4  --   --   --    ?HGB 10.0* 9.1* 9.8* 9.4* 9.5*  ?HCT 30.5* 28.1* 28.8* 28.3* 28.7*  ?MCV 87.4 86.7 84.0 84.5 85.9  ?PLT 321 272 313 281 287  ? ? ?Medications:   ? ? Chlorhexidine Gluconate Cloth  6 each Topical Q0600  ? darbepoetin (ARANESP) injection - DIALYSIS  60 mcg Intravenous Q Sat-HD  ? metoprolol tartrate  12.5 mg Oral BID  ? pantoprazole  40 mg Oral BID  ? PARoxetine  40 mg Oral Daily  ? sevelamer carbonate  1,600 mg Oral TID WC  ? tamsulosin  0.4 mg Oral Daily  ? ? ? ? ?Donney Dice, DO ?PGY-2, [facility]  ?07/11/2021, 7:20 AM   ?

## 2021-07-11 NOTE — Interval H&P Note (Signed)
History and Physical Interval Note: ? ?07/11/2021 ?8:10 AM ? ?Christopher [name]  has presented today for surgery, with the diagnosis of Epigastric pain; GERD.  The various methods of treatment have been discussed with the patient and family. After consideration of risks, benefits and other options for treatment, the patient has consented to  Procedure(s): ?ESOPHAGOGASTRODUODENOSCOPY (EGD) WITH PROPOFOL (N/A) as a surgical intervention.  The patient's history has been reviewed, patient examined, no change in status, stable for surgery.  I have reviewed the patient's chart and labs.  Questions were answered to the patient's satisfaction.   ? ? ?[name] ? ? ?

## 2021-07-11 NOTE — Transfer of Care (Signed)
Immediate Anesthesia Transfer of Care Note ? ?Patient: Christopher [name] ? ?Procedure(s) Performed: ESOPHAGOGASTRODUODENOSCOPY (EGD) WITH PROPOFOL ? ?Patient Location: PACU ? ?Anesthesia Type:MAC ? ?Level of Consciousness: drowsy and patient cooperative ? ?Airway & Oxygen Therapy: Patient Spontanous Breathing and Patient connected to nasal cannula oxygen ? ?Post-op Assessment: Report given to RN and Post -op Vital signs reviewed and stable ? ?Post vital signs: Reviewed and stable ? ?Last Vitals:  ?Vitals Value Taken Time  ?BP 139/51 07/11/21 0848  ?Temp 36.5 ?C 07/11/21 0838  ?Pulse 57 07/11/21 0851  ?Resp 14 07/11/21 0851  ?SpO2 97 % 07/11/21 0851  ?Vitals shown include unvalidated device data. ? ?Last Pain:  ?Vitals:  ? 07/11/21 0838  ?TempSrc: Temporal  ?PainSc:   ?   ? ?  ? ?Complications: No notable events documented. ?

## 2021-07-11 NOTE — Progress Notes (Signed)
removed 590ms net fluid unable to remove more due to hypotension.  pre bp 110/57 post bp 98/64 ?pre weight 89.2kg post weight 88.5kg  .  catheter ran well packed with heparin clamped and capped .  gave aranesp and iv iron as ordered. ?

## 2021-07-11 NOTE — Anesthesia Procedure Notes (Signed)
Procedure Name: [place] ?Date/Time: 07/11/2021 8:21 AM ?Performed by: Renato Shin, CRNA ?Pre-anesthesia Checklist: Patient identified, Emergency Drugs available, Suction available and Patient being monitored ?Patient Re-evaluated:Patient Re-evaluated prior to induction ?Oxygen Delivery Method: Nasal cannula ?Preoxygenation: Pre-oxygenation with 100% oxygen ?Induction Type: IV induction ?Airway Equipment and Method: Bite block ?Placement Confirmation: positive ETCO2 and breath sounds checked- equal and bilateral ?Dental Injury: Teeth and Oropharynx as per pre-operative assessment  ? ? ? ? ?

## 2021-07-11 NOTE — Anesthesia Preprocedure Evaluation (Signed)
Anesthesia Evaluation  ?Patient identified by MRN, date of birth, ID band ?Patient awake ? ? ? ?Reviewed: ?Allergy & Precautions, NPO status , Patient's Chart, lab work & pertinent test results ? ?Airway ?Mallampati: II ? ?TM Distance: >3 FB ?Neck ROM: Full ? ? ? Dental ? ?(+) Poor Dentition, Missing ?  ?Pulmonary ?former smoker,  ?  ?Pulmonary exam normal ? ? ? ? ? ? ? Cardiovascular ?hypertension, Pt. on medications ? ?Rhythm:Regular Rate:Bradycardia ? ? ?  ?Neuro/Psych ?PSYCHIATRIC DISORDERS Anxiety Depression negative neurological ROS ?   ? GI/Hepatic ?negative GI ROS, Neg liver ROS,   ?Endo/Other  ?negative endocrine ROS ? Renal/GU ?ESRF and DialysisRenal disease  ? ?  ?Musculoskeletal ?negative musculoskeletal ROS ?(+)  ? Abdominal ?  ?Peds ? Hematology ? ?(+) Blood dyscrasia, anemia ,   ?Anesthesia Other Findings ?Epigastric pain ?GERD ? Reproductive/Obstetrics ? ?  ? ? ? ? ? ? ? ? ? ? ? ? ? ?  ?  ? ? ? ? ? ? ? ? ?Anesthesia Physical ?Anesthesia Plan ? ?ASA: 3 ? ?Anesthesia Plan: MAC  ? ?Post-op Pain Management:   ? ?Induction: Intravenous ? ?PONV Risk Score and Plan: 1 and Propofol infusion and Treatment may vary due to age or medical condition ? ?Airway Management Planned: Nasal Cannula ? ?Additional Equipment:  ? ?Intra-op Plan:  ? ?Post-operative Plan:  ? ?Informed Consent: I have reviewed the patients History and Physical, chart, labs and discussed the procedure including the risks, benefits and alternatives for the proposed anesthesia with the patient or authorized representative who has indicated his/her understanding and acceptance.  ? ? ? ?Dental advisory given ? ?Plan Discussed with: CRNA ? ?Anesthesia Plan Comments:   ? ? ? ? ? ? ?Anesthesia Quick Evaluation ? ?

## 2021-07-12 ENCOUNTER — Telehealth: Payer: Self-pay | Admitting: Nephrology

## 2021-07-12 NOTE — Telephone Encounter (Signed)
Transition of Care Contact from Inpatient Facility ?  ?Date of Discharge: 07/11/21 ?Date of Contact: 07/12/21 ?Method of contact: phone ?Talked to patient ?  ?Patient contacted to discuss transition of care form recent hospitaliztion. Patient was admitted to [facility] from 07/06/21 to 07/11/21 with the discharge diagnosis of new ERSD requiring HD and new Afib.   ? ?Medication changes were reviewed.  ?Patient will follow up with is outpatient dialysis center 07/14/21.  ?Other follow up needs include none identified. ?  ?Jen Mow, PA-C ?[facility] ?Pager: [phone] ?  ? ?

## 2021-07-13 ENCOUNTER — Telehealth: Payer: Self-pay

## 2021-07-13 NOTE — Telephone Encounter (Signed)
Referral was faxed to [facility] back in March. Referral has been faxed to [facility] again. ?

## 2021-07-13 NOTE — Telephone Encounter (Signed)
-----   Message from Irene Shipper, MD sent at 07/13/2021  3:49 PM EDT ----- ?Thanks Allen. ? ?Vaughan Basta, ? ?Pain felt to be secondary to know cholelithiasis (in the absence of significant abnormalities on EGD). ?He does not need a GI follow up, but rather a general surgical opinion regarding possible lap chole.  ? ?I have discussed this with the patient and his wife previously. ? ?Thanks, ? ?JP ? ? ?----- Message ----- ?From: [name], MD ?Sent: 07/11/2021   8:55 AM EDT ?To: Irene Shipper, MD, Algernon Huxley, RN ? ?Patient's of Dr. Blanch Media. EGD had been planned to evaluate upper abdominal discomfort, anorexia, and nausea. Found to have advanced kidney disease and EGD deferred. He was hospitalized and has started dialysis. EGD today showed mild gastritis. Biopsies are pending. Please arrange hospital follow-up with Dr. Henrene Pastor or an APP in 3-4 weeks. ? ?Thank you. ? ?[facility] ? ? ?

## 2021-07-14 ENCOUNTER — Encounter: Payer: Self-pay | Admitting: Gastroenterology

## 2021-07-14 ENCOUNTER — Encounter (HOSPITAL_COMMUNITY): Payer: Self-pay | Admitting: Gastroenterology

## 2021-07-14 DIAGNOSIS — R109 Unspecified abdominal pain: Secondary | ICD-10-CM | POA: Diagnosis not present

## 2021-07-14 DIAGNOSIS — N2581 Secondary hyperparathyroidism of renal origin: Secondary | ICD-10-CM | POA: Diagnosis not present

## 2021-07-14 DIAGNOSIS — N186 End stage renal disease: Secondary | ICD-10-CM | POA: Diagnosis not present

## 2021-07-14 DIAGNOSIS — Z992 Dependence on renal dialysis: Secondary | ICD-10-CM | POA: Diagnosis not present

## 2021-07-14 LAB — SURGICAL PATHOLOGY

## 2021-07-16 DIAGNOSIS — N2581 Secondary hyperparathyroidism of renal origin: Secondary | ICD-10-CM | POA: Diagnosis not present

## 2021-07-16 DIAGNOSIS — Z992 Dependence on renal dialysis: Secondary | ICD-10-CM | POA: Diagnosis not present

## 2021-07-16 DIAGNOSIS — N186 End stage renal disease: Secondary | ICD-10-CM | POA: Diagnosis not present

## 2021-07-18 DIAGNOSIS — Z992 Dependence on renal dialysis: Secondary | ICD-10-CM | POA: Diagnosis not present

## 2021-07-18 DIAGNOSIS — N186 End stage renal disease: Secondary | ICD-10-CM | POA: Diagnosis not present

## 2021-07-18 DIAGNOSIS — N2581 Secondary hyperparathyroidism of renal origin: Secondary | ICD-10-CM | POA: Diagnosis not present

## 2021-07-19 DIAGNOSIS — I129 Hypertensive chronic kidney disease with stage 1 through stage 4 chronic kidney disease, or unspecified chronic kidney disease: Secondary | ICD-10-CM | POA: Diagnosis not present

## 2021-07-19 DIAGNOSIS — Z992 Dependence on renal dialysis: Secondary | ICD-10-CM | POA: Diagnosis not present

## 2021-07-19 DIAGNOSIS — N186 End stage renal disease: Secondary | ICD-10-CM | POA: Diagnosis not present

## 2021-07-20 ENCOUNTER — Encounter: Payer: Self-pay | Admitting: Internal Medicine

## 2021-07-20 ENCOUNTER — Ambulatory Visit: Payer: Medicare PPO | Admitting: Internal Medicine

## 2021-07-20 VITALS — BP 104/60 | HR 52 | Ht 69.0 in | Wt 202.2 lb

## 2021-07-20 DIAGNOSIS — I4891 Unspecified atrial fibrillation: Secondary | ICD-10-CM

## 2021-07-20 NOTE — Progress Notes (Signed)
?Cardiology Office Note:   ? ?Date:  07/20/2021  ? ?ID:  Christopher [name], DOB 10/18/1950, MRN [id number] ? ?PCP:  Horald Pollen, MD ?  ?[facility] Providers ?Cardiologist:  Janina Mayo, MD    ? ?Referring MD: [name], MD  ? ?No chief complaint on file. ?Afib ? ?History of Present Illness:   ? ?Christopher [name] is a 71 y.o. male with a hx of HTN, ESRD recent start to IHD Tue, Thur, Sat on 4/22, hx of bladder CA, diagnosed with new onset paroxysmal afib  ? ?He was started on BB and DOAC. He converted to sinus rhythm. He just started IHD. Still makes urine. He's on lasix 80 mg BID on non IHD days. He has a new RUE fistula, still using TDC.  He is taking his metorpolol. Blood pressures are well controlled.  Has some leg swelling. He did not feel palpitations. No hx of OSA. TSH nl.  ? ?Family hx: father had hx of CAD.  ? ?Former smoker ? ?Cardiology Studies: ?EKG 07/06/2021- Afib with RVR 140s ?EKG 07/07/2021- NSR ? ?07/07/2021 ?1. Left ventricular ejection fraction, by estimation, is 60 to 65%. The  ?left ventricle has normal function. The left ventricle has no regional  ?wall motion abnormalities. Left ventricular diastolic parameters are  ?consistent with Grade I diastolic  ?dysfunction (impaired relaxation).  ? 2. Right ventricular systolic function is normal. The right ventricular  ?size is normal.  ? 3. Left atrial size was mild to moderately dilated.  ? 4. The mitral valve is grossly normal. Mild mitral valve regurgitation.  ?No evidence of mitral stenosis.  ? 5. The aortic valve is tricuspid. Aortic valve regurgitation is mild. No  ?aortic stenosis is present.  ? ?Past Medical History:  ?Diagnosis Date  ? Anxiety   ? Cancer [facility])   ? bladder  cancer- stage 1- removed 2007  ? Chronic kidney disease   ? CKD   ? Depression   ? Hypertension   ? ? ?Past Surgical History:  ?Procedure Laterality Date  ? AV FISTULA PLACEMENT Right 07/08/2021  ? Procedure: RIGHT ARM FISTULA  CREATION;  Surgeon:  [name], MD;  Location: [facility] OR;  Service: Vascular;  Laterality: Right;  ? BIOPSY  07/11/2021  ? Procedure: BIOPSY;  Surgeon: [name], MD;  Location: [place];  Service: Gastroenterology;;  ? BLADDER SURGERY    ? COLONOSCOPY    ? ESOPHAGOGASTRODUODENOSCOPY (EGD) WITH PROPOFOL N/A 07/11/2021  ? Procedure: ESOPHAGOGASTRODUODENOSCOPY (EGD) WITH PROPOFOL;  Surgeon: [name], MD;  Location: [place];  Service: Gastroenterology;  Laterality: N/A;  ? INSERTION OF DIALYSIS CATHETER N/A 07/08/2021  ? Procedure: INSERTION OF DIALYSIS CATHETER;  Surgeon: [name], MD;  Location: [place];  Service: Vascular;  Laterality: N/A;  ? JOINT REPLACEMENT  2016  ? knee right   ? POLYPECTOMY    ? ? ?Current Medications: ?Current Meds  ?Medication Sig  ? apixaban (ELIQUIS) 5 MG TABS tablet Take 1 tablet (5 mg total) by mouth 2 (two) times daily.  ? furosemide (LASIX) 80 MG tablet Twice a day on non-dialysis days  ? metoprolol succinate (TOPROL XL) 25 MG 24 hr tablet Take 1 tablet (25 mg total) by mouth daily.  ? Multiple Vitamin (MULTIVITAMIN) tablet Take 1 tablet by mouth daily.  ? Omega-3 Fatty Acids (FISH OIL PO) Take 500 mg by mouth.  ? pantoprazole (PROTONIX) 40 MG tablet TAKE 1 TABLET(40 MG) BY MOUTH DAILY  ? PARoxetine (PAXIL)  40 MG tablet Take 40 mg by mouth daily.  ? tamsulosin (FLOMAX) 0.4 MG CAPS capsule Take 0.4 mg by mouth daily.  ? ?Current Facility-Administered Medications for the 07/20/21 encounter (Office Visit) with Janina Mayo, MD  ?Medication  ? 0.9 %  sodium chloride infusion  ? 0.9 %  sodium chloride infusion  ?  ? ?Allergies:   Patient has no known allergies.  ? ?Social History  ? ?Socioeconomic History  ? Marital status: Married  ?  Spouse name: Not on file  ? Number of children: Not on file  ? Years of education: Not on file  ? Highest education level: Not on file  ?Occupational History  ? Not on file  ?Tobacco Use  ? Smoking status: Former  ?  Types: Cigarettes  ?  Quit  date: 03/22/1972  ?  Years since quitting: 49.3  ? Smokeless tobacco: Never  ?Substance and Sexual Activity  ? Alcohol use: Yes  ?  Alcohol/week: 0.0 standard drinks  ?  Comment: rare  ? Drug use: No  ? Sexual activity: Not on file  ?Other Topics Concern  ? Not on file  ?Social History Narrative  ? Not on file  ? ?Social Determinants of Health  ? ?Financial Resource Strain: Not on file  ?Food Insecurity: Not on file  ?Transportation Needs: Not on file  ?Physical Activity: Not on file  ?Stress: Not on file  ?Social Connections: Not on file  ?  ? ?Family History: ?The patient's family history includes Cancer in his father, mother, and sister; Diabetes in his sister; Heart disease in his father and sister; Hypertension in his father and sister; Mental illness in his brother; Stomach cancer in his father. There is no history of Colon cancer, Colon polyps, Esophageal cancer, or Rectal cancer. ? ?ROS:   ?Please see the history of present illness.    ? All other systems reviewed and are negative. ? ?EKGs/Labs/Other Studies Reviewed:   ? ?The following studies were reviewed today: ? ? ?EKG:  EKG is  ordered today.  The ekg ordered today demonstrates  ? ?EKG 07/20/2021: Sinus bradycardia HR 52 bpm ? ?Recent Labs: ?07/06/2021: B Natriuretic Peptide 199.5 ?07/07/2021: Magnesium 2.0; TSH 4.185 ?07/10/2021: ALT 9 ?07/11/2021: BUN 51; Creatinine, Ser 5.73; Hemoglobin 9.5; Platelets 287; Potassium 3.9; Sodium 136  ?Recent Lipid Panel ?   ?Component Value Date/Time  ? CHOL 143 10/08/2020 0847  ? CHOL 164 10/11/2018 1037  ? TRIG 88.0 10/08/2020 0847  ? HDL 49.00 10/08/2020 0847  ? HDL 51 10/11/2018 1037  ? CHOLHDL 3 10/08/2020 0847  ? VLDL 17.6 10/08/2020 0847  ? [place] 77 10/08/2020 0847  ? [place] 89 10/11/2018 1037  ? ? ? ?Risk Assessment/Calculations:   ? ?CHA2DS2-VASc Score = 2  ? This indicates a 2.2% annual risk of stroke. ?The patient's score is based upon: ?CHF History: 0 ?HTN History: 1 ?Diabetes History: 0 ?Stroke History:  0 ?Vascular Disease History: 0 ?Age Score: 1 ?Gender Score: 0 ?  ? ? ?    ? ? ?The 10-year ASCVD risk score (Arnett DK, et al., 2019) is: 12.7% ?  Values used to calculate the score: ?    Age: 48 years ?    Sex: Male ?    Is Non-Hispanic African American: No ?    Diabetic: No ?    Tobacco smoker: No ?    Systolic Blood Pressure: 938 mmHg ?    Is BP treated: Yes ?    HDL  Cholesterol: 49 mg/dL ?    Total Cholesterol: 143 mg/dL ? ?Physical Exam:   ? ?VS:  BP 104/60   Pulse (!) 52   Ht '5\' 9"'$  (1.753 m)   Wt 202 lb 3.2 oz (91.7 kg)   SpO2 97%   BMI 29.86 kg/m?    ? ?Wt Readings from Last 3 Encounters:  ?07/20/21 202 lb 3.2 oz (91.7 kg)  ?07/11/21 202 lb 13.2 oz (92 kg)  ?07/03/21 213 lb (96.6 kg)  ?  ? ?GEN:  Well nourished, well developed in no acute distress ?HEENT: Normal ?NECK: No JVD ?LYMPHATICS: No lymphadenopathy ?CARDIAC: bradycardic , no murmurs, rubs, gallops ?RESPIRATORY:  Clear to auscultation without rales, wheezing or rhonchi  ?ABDOMEN: Soft, non-tender, non-distended ?MUSCULOSKELETAL:  No edema; No deformity  ?SKIN: Warm and dry ?NEUROLOGIC:  Alert and oriented x 3 ?PSYCHIATRIC:  Normal affect  ? ?ASSESSMENT:   ? ?New Onset Paroxysmal Afib: In sinus rhythm. Continue rate control strategy with metop. HR 52. Discussed if he has symptoms of bradycardia to let us know, can decrease dosing. Continue eliquis 5 mg BID ? ?ASCVD: new start statin is controversial in IHD patients. Some studies have not shown mortality benefit. LDL 77, can defer for now ? ?PLAN:   ? ?In order of problems listed above: ? ?Follow up in 3 months ? ?   ? ? ?Medication Adjustments/Labs and Tests Ordered: ?Current medicines are reviewed at length with the patient today.  Concerns regarding medicines are outlined above.  ?No orders of the defined types were placed in this encounter. ? ?No orders of the defined types were placed in this encounter. ? ? ?Patient Instructions  ?Medication Instructions:  ?No Changes In Medications at this  time.  ?*If you need a refill on your cardiac medications before your next appointment, please call your pharmacy* ? ?Lab Work: ?None Ordered At This Time.  ?If you have labs (blood work) drawn today and your tests are

## 2021-07-20 NOTE — Patient Instructions (Signed)
Medication Instructions:  ?No Changes In Medications at this time.  ?*If you need a refill on your cardiac medications before your next appointment, please call your pharmacy* ? ?Lab Work: ?None Ordered At This Time.  ?If you have labs (blood work) drawn today and your tests are completely normal, you will receive your results only by: ?MyChart Message (if you have MyChart) OR ?A paper copy in the mail ?If you have any lab test that is abnormal or we need to change your treatment, we will call you to review the results. ? ?Testing/Procedures: ?None Ordered At This Time.  ? ?Follow-Up: ?At [facility], you and your health needs are our priority.  As part of our continuing mission to provide you with exceptional heart care, we have created designated Provider Care Teams.  These Care Teams include your primary Cardiologist (physician) and Advanced Practice Providers (APPs -  Physician Assistants and Nurse Practitioners) who all work together to provide you with the care you need, when you need it. ? ?We recommend signing up for the patient portal called "MyChart".  Sign up information is provided on this After Visit Summary.  MyChart is used to connect with patients for Virtual Visits (Telemedicine).  Patients are able to view lab/test results, encounter notes, upcoming appointments, etc.  Non-urgent messages can be sent to your provider as well.   ?To learn more about what you can do with MyChart, go to [email or web address].   ? ?Your next appointment:   ?3 month(s) ? ?The format for your next appointment:   ?In Person ? ?Provider:   ?Janina Mayo, MD   ? ? ? ? ? ? ? ? ?

## 2021-07-21 DIAGNOSIS — Z992 Dependence on renal dialysis: Secondary | ICD-10-CM | POA: Diagnosis not present

## 2021-07-21 DIAGNOSIS — N2581 Secondary hyperparathyroidism of renal origin: Secondary | ICD-10-CM | POA: Diagnosis not present

## 2021-07-21 DIAGNOSIS — N186 End stage renal disease: Secondary | ICD-10-CM | POA: Diagnosis not present

## 2021-07-23 DIAGNOSIS — N186 End stage renal disease: Secondary | ICD-10-CM | POA: Diagnosis not present

## 2021-07-23 DIAGNOSIS — Z992 Dependence on renal dialysis: Secondary | ICD-10-CM | POA: Diagnosis not present

## 2021-07-23 DIAGNOSIS — N2581 Secondary hyperparathyroidism of renal origin: Secondary | ICD-10-CM | POA: Diagnosis not present

## 2021-07-25 DIAGNOSIS — N2581 Secondary hyperparathyroidism of renal origin: Secondary | ICD-10-CM | POA: Diagnosis not present

## 2021-07-25 DIAGNOSIS — N186 End stage renal disease: Secondary | ICD-10-CM | POA: Diagnosis not present

## 2021-07-25 DIAGNOSIS — Z992 Dependence on renal dialysis: Secondary | ICD-10-CM | POA: Diagnosis not present

## 2021-07-27 ENCOUNTER — Ambulatory Visit (INDEPENDENT_AMBULATORY_CARE_PROVIDER_SITE_OTHER): Payer: Medicare PPO | Admitting: Emergency Medicine

## 2021-07-27 ENCOUNTER — Encounter: Payer: Self-pay | Admitting: Emergency Medicine

## 2021-07-27 VITALS — BP 100/60 | HR 57 | Temp 98.0°F | Ht 69.0 in | Wt 200.0 lb

## 2021-07-27 DIAGNOSIS — D6859 Other primary thrombophilia: Secondary | ICD-10-CM

## 2021-07-27 DIAGNOSIS — I4891 Unspecified atrial fibrillation: Secondary | ICD-10-CM | POA: Diagnosis not present

## 2021-07-27 DIAGNOSIS — Z992 Dependence on renal dialysis: Secondary | ICD-10-CM | POA: Diagnosis not present

## 2021-07-27 DIAGNOSIS — N186 End stage renal disease: Secondary | ICD-10-CM | POA: Diagnosis not present

## 2021-07-27 DIAGNOSIS — Z7901 Long term (current) use of anticoagulants: Secondary | ICD-10-CM | POA: Diagnosis not present

## 2021-07-27 DIAGNOSIS — I10 Essential (primary) hypertension: Secondary | ICD-10-CM | POA: Diagnosis not present

## 2021-07-27 NOTE — Patient Instructions (Signed)
Eating Plan for Hemodialysis ?Dialysis is a procedure that is done when the kidneys have stopped working properly. The condition is called kidney failure. During dialysis, waste products, salt, and extra water are removed from the blood. Dialysis helps maintain the levels of certain minerals in the blood. Even though nutrients, such as carbohydrates, fats, vitamins, and minerals, are an important part of a healthy diet, you may need to limit your intake of certain nutrients if you are undergoing dialysis. Between dialysis sessions, certain nutrients and wastes can build up in your blood and make you sick. ?When you are on dialysis, it is important that you work with your health care provider or a dietitian to help you make an eating plan that meets your specific needs. ?What are tips for following this plan? ?Reading food labels ? ?Check food labels for the amounts of these nutrients: ?Potassium. This is found in milk, fruits, and vegetables. ?Phosphorus. This is found in milk, cheese, beans, nuts, and carbonated beverages. ?Sodium. Sodium content is high in processed and cured meats, ready-made frozen meals, canned vegetables, and salty snack foods. ?Try to find foods that are low in potassium, phosphorus, and sodium. ?Look for foods that are labeled "sodium free," "reduced sodium," or "low sodium." ?Shopping ?Do not buy whole-grain and high-fiber foods because they contain high amounts of phosphorus. ?Do not buy or use salt substitutes because they contain potassium. ?Do not buy processed foods. These are usually high in sodium and phosphorus. ?Cooking ?Drain all fluid from cooked vegetables and canned fruits before eating them. ?Cut potatoes into small pieces and boil them in unsalted water before you eat them. This can help to remove some potassium from the potato. ?To add flavor, try using herbs and spices that do not contain sodium. ?Meal planning ?Most people on dialysis should try to eat: ?6-11 servings of  grains each day. One serving is equal to 1 slice of bread or ? cup (118 mL) of cooked rice or pasta. ?2-3 servings of low-potassium vegetables each day. One serving is equal to ? cup (118 mL). ?2-3 servings of low-potassium fruits each day. One serving is equal to ? cup (118 mL). ?High-quality proteins, such as meat, poultry, fish, and eggs. Talk with your health care provider or dietitian about the right amount and type of protein to include in your eating plan. ?? cup (118 mL) of dairy each day. ?Avoid foods that are high in sodium and phosphorus. Foods that generally are very salty will be high in sodium, and foods that are high in fiber or starch may be high in phosphorus. ?General information ?Follow your health care provider's instructions to restrict your fluid intake. You may be told to: ?Write down what you drink and any foods you eat that are made mostly from water, such as gelatin and soups. ?Drink from small cups to help control how much you drink. ?Take vitamin and mineral supplements only as told by your health care provider. ?Take over-the-counter and prescription medicines only as told by your health care provider. ?Your health care provider may recommend an over-the-counter medicine that binds phosphorus, such as an antacid medicine that contains calcium carbonate. ?What foods should I eat? ?Fruits ?Apples. Fresh or frozen berries. Fresh or canned pears, peaches, and pineapple. Grapes. Plums. ?Vegetables ?Fresh or frozen broccoli, carrots, and green beans. Cabbage. Cauliflower. Celery. Cucumbers. Eggplant. Radishes. Zucchini. ?Grains ?White bread. White rice. Cooked cereal. Unsalted popcorn. Tortillas. Pasta. ?Meats and other proteins ?Fresh or frozen beef, pork, chicken, and  fish. Eggs. ?Dairy ?Cream cheese. Heavy cream. Ricotta cheese. ?Beverages ?Apple cider. Cranberry juice. Grape juice. Lemonade. Black coffee. Rice milk (that is not enriched or fortified). ?Condiments ?Herbs. Spices. Jam and  jelly. Honey. ?Sweets and desserts ?Sherbet. Cakes. Cookies. ?Fats and oils ?Olive oil, canola oil, and safflower oil. ?Other ?Non-dairy creamer. Non-dairy whipped topping. Homemade broth without salt. ?The items listed above may not be a complete list of foods and beverages you can eat. Contact a dietitian for more information. ?What foods should I avoid? ?Fruits ?Star fruit. Bananas. Oranges. Kiwi. Nectarines. Prunes. Melon. Dried fruit. Avocado. ?Vegetables ?Potatoes. Beets. Tomatoes. Winter squash and pumpkin. Asparagus. Spinach. Parsnips. ?Grains ?Whole-grain bread. Whole-grain pasta. High-fiber cereal. ?Meats and other proteins ?Canned, smoked, and cured meats. Soil scientist. Sardines. Nuts and seeds. Peanut butter. Beans and legumes. ?Dairy ?Milk. Buttermilk. Yogurt. Cheese and cottage cheese. Processed cheese spreads. ?Beverages ?Orange juice. Prune juice. Carbonated soft drinks. ?Condiments ?Salt. Salt substitutes. Soy sauce. ?Sweets and desserts ?Ice cream. Chocolate. Candied nuts. ?Fats and oils ?Butter. Margarine. ?Other ?Ready-made frozen meals. Canned soups. ?The items listed above may not be a complete list of foods and beverages you should avoid. Contact a dietitian for more information. ?Where to find more information ?[employer] of Diabetes and Digestive and Kidney Diseases: [employer] ?Summary ?Dialysis is a procedure that is done when a person has kidney failure. ?If you are undergoing dialysis, it is important to pay careful attention to what you eat. Between dialysis sessions, certain nutrients and wastes can build up in your blood and cause you to get sick. ?Your dietitian will help you design an eating plan that is specific to your needs. ?Avoid foods that are high in sodium and phosphorus. Limit use of fluids as told by your health care provider. ?This information is not intended to replace advice given to you by your health care provider. Make sure you discuss any  questions you have with your health care provider. ?Document Revised: 04/21/2021 Document Reviewed: 11/13/2019 ?Elsevier Patient Education ? [place]. ? ?

## 2021-07-28 DIAGNOSIS — D6859 Other primary thrombophilia: Secondary | ICD-10-CM | POA: Insufficient documentation

## 2021-07-28 DIAGNOSIS — Z7901 Long term (current) use of anticoagulants: Secondary | ICD-10-CM | POA: Insufficient documentation

## 2021-07-28 DIAGNOSIS — N2581 Secondary hyperparathyroidism of renal origin: Secondary | ICD-10-CM | POA: Diagnosis not present

## 2021-07-28 DIAGNOSIS — N186 End stage renal disease: Secondary | ICD-10-CM | POA: Diagnosis not present

## 2021-07-28 DIAGNOSIS — Z992 Dependence on renal dialysis: Secondary | ICD-10-CM | POA: Diagnosis not present

## 2021-07-28 NOTE — Assessment & Plan Note (Signed)
Continue metoprolol succinate 25 mg daily and Eliquis 5 mg twice a day. ?Fall precautions given. ?

## 2021-07-28 NOTE — Assessment & Plan Note (Signed)
Tolerating hemodialysis well.  Stable. ?

## 2021-07-28 NOTE — Assessment & Plan Note (Signed)
Well-controlled hypertension. ?BP Readings from Last 3 Encounters:  ?07/27/21 100/60  ?07/20/21 104/60  ?07/11/21 95/61  ? ? ?

## 2021-07-28 NOTE — Progress Notes (Signed)
Christopher [name] ?71 y.o. ? ? ?Chief Complaint  ?Patient presents with  ? Follow-up  ? Dizziness  ? ? ?HISTORY OF PRESENT ILLNESS: ?This is a 71 y.o. male here for follow-up of chronic medical problems and also occasional dizziness. ?Christopher [name] is a 71 y.o. male with a hx of HTN, ESRD recent start to IHD Tue, Thur, Sat on 4/22, hx of bladder CA, diagnosed with new onset paroxysmal afib  ?Doing well.  No other complaints or medical concerns today. ?He was started on BB and DOAC. He converted to sinus rhythm. He just started IHD. Still makes urine. He's on lasix 80 mg BID on non IHD days. He has a new RUE fistula, still using TDC.  He is taking his metorpolol. Blood pressures are well controlled.  Has some leg swelling. He did not feel palpitations. No hx of OSA. TSH nl.  ?Most recent cardiologist office visit note on 07/20/2021 as follows: ? ?ASSESSMENT:   ?  ?New Onset Paroxysmal Afib: In sinus rhythm. Continue rate control strategy with metop. HR 52. Discussed if he has symptoms of bradycardia to let us know, can decrease dosing. Continue eliquis 5 mg BID ?  ?ASCVD: new start statin is controversial in IHD patients. Some studies have not shown mortality benefit. LDL 77, can defer for now ?  ?PLAN:   ?  ?In order of problems listed above: ?  ?Follow up in 3 months ?Dizziness ?Pertinent negatives include no abdominal pain, chest pain, chills, congestion, fever, nausea, rash, sore throat or vomiting.  ? ? ?Prior to Admission medications   ?Medication Sig Start Date End Date Taking? Authorizing Provider  ?apixaban (ELIQUIS) 5 MG TABS tablet Take 1 tablet (5 mg total) by mouth 2 (two) times daily. 07/11/21  Yes Wouk, Ailene Rud, MD  ?furosemide (LASIX) 80 MG tablet Twice a day on non-dialysis days 07/11/21  Yes Wouk, Ailene Rud, MD  ?metoprolol succinate (TOPROL XL) 25 MG 24 hr tablet Take 1 tablet (25 mg total) by mouth daily. 07/11/21 07/11/22 Yes Wouk, Ailene Rud, MD  ?Multiple Vitamin (MULTIVITAMIN) tablet  Take 1 tablet by mouth daily.   Yes [provider]  ?Omega-3 Fatty Acids (FISH OIL PO) Take 500 mg by mouth.   Yes [provider]  ?pantoprazole (PROTONIX) 40 MG tablet TAKE 1 TABLET(40 MG) BY MOUTH DAILY 06/24/21  Yes Broderick Fonseca, Ines Bloomer, MD  ?PARoxetine (PAXIL) 40 MG tablet Take 40 mg by mouth daily. 06/14/21  Yes [provider]  ?tamsulosin (FLOMAX) 0.4 MG CAPS capsule Take 0.4 mg by mouth daily. 04/28/21  Yes [provider]  ? ? ?No Known Allergies ? ?Patient Active Problem List  ? Diagnosis Date Noted  ? Gastritis and gastroduodenitis   ? Nausea without vomiting   ? Loss of weight   ? ARF (acute renal failure) ([place]) 07/07/2021  ? Atrial fibrillation with RVR ([place]) 07/07/2021  ? Hyperkalemia   ? Upper abdominal pain 02/17/2021  ? Heartburn 02/17/2021  ? Moderate episode of recurrent major depressive disorder ([place]) 10/08/2020  ? Stage 4 chronic kidney disease ([place]) 04/16/2019  ? Memory deficits 04/16/2019  ? Kidney cysts 04/16/2019  ? Depression 08/26/2016  ? 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 HTN (hypertension) 11/21/2011  ? ? ?Past Medical History:  ?Diagnosis Date  ? Anxiety   ? Cancer [facility])   ? bladder  cancer- stage 1- removed 2007  ? Chronic kidney disease   ? CKD   ? Depression   ?  Hypertension   ? ? ?Past Surgical History:  ?Procedure Laterality Date  ? AV FISTULA PLACEMENT Right 07/08/2021  ? Procedure: RIGHT ARM FISTULA  CREATION;  Surgeon: [name], MD;  Location: [facility] OR;  Service: Vascular;  Laterality: Right;  ? BIOPSY  07/11/2021  ? Procedure: BIOPSY;  Surgeon: [name], MD;  Location: [place];  Service: Gastroenterology;;  ? BLADDER SURGERY    ? COLONOSCOPY    ? ESOPHAGOGASTRODUODENOSCOPY (EGD) WITH PROPOFOL N/A 07/11/2021  ? Procedure: ESOPHAGOGASTRODUODENOSCOPY (EGD) WITH PROPOFOL;  Surgeon: [name], MD;  Location: [place];  Service: Gastroenterology;  Laterality: N/A;  ? INSERTION OF DIALYSIS CATHETER  N/A 07/08/2021  ? Procedure: INSERTION OF DIALYSIS CATHETER;  Surgeon: [name], MD;  Location: [place];  Service: Vascular;  Laterality: N/A;  ? JOINT REPLACEMENT  2016  ? knee right   ? POLYPECTOMY    ? ? ?Social History  ? ?Socioeconomic History  ? Marital status: Married  ?  Spouse name: Not on file  ? Number of children: Not on file  ? Years of education: Not on file  ? Highest education level: Not on file  ?Occupational History  ? Not on file  ?Tobacco Use  ? Smoking status: Former  ?  Types: Cigarettes  ?  Quit date: 03/22/1972  ?  Years since quitting: 49.3  ? Smokeless tobacco: Never  ?Substance and Sexual Activity  ? Alcohol use: Yes  ?  Alcohol/week: 0.0 standard drinks  ?  Comment: rare  ? Drug use: No  ? Sexual activity: Not on file  ?Other Topics Concern  ? Not on file  ?Social History Narrative  ? Not on file  ? ?Social Determinants of Health  ? ?Financial Resource Strain: Not on file  ?Food Insecurity: Not on file  ?Transportation Needs: Not on file  ?Physical Activity: Not on file  ?Stress: Not on file  ?Social Connections: Not on file  ?Intimate Partner Violence: Not on file  ? ? ?Family History  ?Problem Relation Age of Onset  ? Cancer Mother   ? Cancer Father   ? Hypertension Father   ? Heart disease Father   ? Stomach cancer Father   ? Cancer Sister   ? Diabetes Sister   ? Heart disease Sister   ? Hypertension Sister   ? Mental illness Brother   ? Colon cancer Neg Hx   ? Colon polyps Neg Hx   ? Esophageal cancer Neg Hx   ? Rectal cancer Neg Hx   ? ? ? ?Review of Systems  ?Constitutional: Negative.  Negative for chills and fever.  ?HENT:  Negative for congestion and sore throat.   ?Respiratory: Negative.  Negative for shortness of breath.   ?Cardiovascular: Negative.  Negative for chest pain.  ?Gastrointestinal:  Negative for abdominal pain, nausea and vomiting.  ?Genitourinary: Negative.   ?Skin: Negative.  Negative for rash.  ?Neurological:  Positive for dizziness.  ?All other systems  reviewed and are negative. ? ?Today's Vitals  ? 07/27/21 1534  ?BP: 100/60  ?Pulse: (!) 57  ?Temp: 98 ?F (36.7 ?C)  ?TempSrc: Oral  ?SpO2: 94%  ?Weight: 200 lb (90.7 kg)  ?Height: '5\' 9"'$  (1.753 m)  ? ?Body mass index is 29.53 kg/m?. ? ?Physical Exam ?Constitutional:   ?   Appearance: Normal appearance.  ?HENT:  ?   Head: Normocephalic.  ?Eyes:  ?   Extraocular Movements: Extraocular movements intact.  ?   Pupils: Pupils are equal, round, and  reactive to light.  ?Cardiovascular:  ?   Rate and Rhythm: Normal rate. Rhythm irregular.  ?   Pulses: Normal pulses.  ?   Heart sounds: Normal heart sounds.  ?Pulmonary:  ?   Effort: Pulmonary effort is normal.  ?   Breath sounds: Normal breath sounds.  ?Abdominal:  ?   Palpations: Abdomen is soft.  ?   Tenderness: There is no abdominal tenderness.  ?Musculoskeletal:  ?   Cervical back: No tenderness.  ?Lymphadenopathy:  ?   Cervical: No cervical adenopathy.  ?Skin: ?   General: Skin is warm and dry.  ?   Capillary Refill: Capillary refill takes less than 2 seconds.  ?Neurological:  ?   General: No focal deficit present.  ?   Mental Status: He is alert and oriented to person, place, and time.  ?Psychiatric:     ?   Mood and Affect: Mood normal.     ?   Behavior: Behavior normal.  ? ? ? ?ASSESSMENT & PLAN: ?A total of 50 minutes was spent with the patient and counseling/coordination of care regarding preparing for this visit, review of most recent office visit notes, review of most recent cardiologist office visit notes, review of all medications, review of multiple chronic medical problems and their management, review of most recent blood work results, education on nutrition, prognosis, documentation and need for follow-up. ? ?Problem List Items Addressed This Visit   ? ?  ? Cardiovascular and Mediastinum  ? Primary hypertension - Primary  ?  Well-controlled hypertension. ?BP Readings from Last 3 Encounters:  ?07/27/21 100/60  ?07/20/21 104/60  ?07/11/21 95/61  ? ? ?  ?  ?  Controlled atrial fibrillation (HCC)  ?  Continue metoprolol succinate 25 mg daily and Eliquis 5 mg twice a day. ?Fall precautions given. ? ?  ?  ?  ? Genitourinary  ? Chronic kidney disease on chronic dialysis (

## 2021-07-30 DIAGNOSIS — Z992 Dependence on renal dialysis: Secondary | ICD-10-CM | POA: Diagnosis not present

## 2021-07-30 DIAGNOSIS — N186 End stage renal disease: Secondary | ICD-10-CM | POA: Diagnosis not present

## 2021-07-30 DIAGNOSIS — N2581 Secondary hyperparathyroidism of renal origin: Secondary | ICD-10-CM | POA: Diagnosis not present

## 2021-08-01 DIAGNOSIS — N2581 Secondary hyperparathyroidism of renal origin: Secondary | ICD-10-CM | POA: Diagnosis not present

## 2021-08-01 DIAGNOSIS — Z992 Dependence on renal dialysis: Secondary | ICD-10-CM | POA: Diagnosis not present

## 2021-08-01 DIAGNOSIS — N186 End stage renal disease: Secondary | ICD-10-CM | POA: Diagnosis not present

## 2021-08-04 DIAGNOSIS — N186 End stage renal disease: Secondary | ICD-10-CM | POA: Diagnosis not present

## 2021-08-04 DIAGNOSIS — Z992 Dependence on renal dialysis: Secondary | ICD-10-CM | POA: Diagnosis not present

## 2021-08-04 DIAGNOSIS — N2581 Secondary hyperparathyroidism of renal origin: Secondary | ICD-10-CM | POA: Diagnosis not present

## 2021-08-06 DIAGNOSIS — Z992 Dependence on renal dialysis: Secondary | ICD-10-CM | POA: Diagnosis not present

## 2021-08-06 DIAGNOSIS — N2581 Secondary hyperparathyroidism of renal origin: Secondary | ICD-10-CM | POA: Diagnosis not present

## 2021-08-06 DIAGNOSIS — N186 End stage renal disease: Secondary | ICD-10-CM | POA: Diagnosis not present

## 2021-08-08 DIAGNOSIS — N186 End stage renal disease: Secondary | ICD-10-CM | POA: Diagnosis not present

## 2021-08-08 DIAGNOSIS — Z992 Dependence on renal dialysis: Secondary | ICD-10-CM | POA: Diagnosis not present

## 2021-08-08 DIAGNOSIS — N2581 Secondary hyperparathyroidism of renal origin: Secondary | ICD-10-CM | POA: Diagnosis not present

## 2021-08-11 DIAGNOSIS — N186 End stage renal disease: Secondary | ICD-10-CM | POA: Diagnosis not present

## 2021-08-11 DIAGNOSIS — N2581 Secondary hyperparathyroidism of renal origin: Secondary | ICD-10-CM | POA: Diagnosis not present

## 2021-08-11 DIAGNOSIS — Z992 Dependence on renal dialysis: Secondary | ICD-10-CM | POA: Diagnosis not present

## 2021-08-13 ENCOUNTER — Other Ambulatory Visit: Payer: Self-pay

## 2021-08-13 DIAGNOSIS — Z992 Dependence on renal dialysis: Secondary | ICD-10-CM

## 2021-08-13 DIAGNOSIS — N2581 Secondary hyperparathyroidism of renal origin: Secondary | ICD-10-CM | POA: Diagnosis not present

## 2021-08-13 DIAGNOSIS — N186 End stage renal disease: Secondary | ICD-10-CM | POA: Diagnosis not present

## 2021-08-14 ENCOUNTER — Other Ambulatory Visit: Payer: Self-pay | Admitting: Surgery

## 2021-08-14 DIAGNOSIS — K8020 Calculus of gallbladder without cholecystitis without obstruction: Secondary | ICD-10-CM | POA: Diagnosis not present

## 2021-08-15 DIAGNOSIS — N2581 Secondary hyperparathyroidism of renal origin: Secondary | ICD-10-CM | POA: Diagnosis not present

## 2021-08-15 DIAGNOSIS — Z992 Dependence on renal dialysis: Secondary | ICD-10-CM | POA: Diagnosis not present

## 2021-08-15 DIAGNOSIS — N186 End stage renal disease: Secondary | ICD-10-CM | POA: Diagnosis not present

## 2021-08-18 DIAGNOSIS — N186 End stage renal disease: Secondary | ICD-10-CM | POA: Diagnosis not present

## 2021-08-18 DIAGNOSIS — Z992 Dependence on renal dialysis: Secondary | ICD-10-CM | POA: Diagnosis not present

## 2021-08-18 DIAGNOSIS — N2581 Secondary hyperparathyroidism of renal origin: Secondary | ICD-10-CM | POA: Diagnosis not present

## 2021-08-19 DIAGNOSIS — Z992 Dependence on renal dialysis: Secondary | ICD-10-CM | POA: Diagnosis not present

## 2021-08-19 DIAGNOSIS — I129 Hypertensive chronic kidney disease with stage 1 through stage 4 chronic kidney disease, or unspecified chronic kidney disease: Secondary | ICD-10-CM | POA: Diagnosis not present

## 2021-08-19 DIAGNOSIS — N186 End stage renal disease: Secondary | ICD-10-CM | POA: Diagnosis not present

## 2021-08-20 DIAGNOSIS — N2581 Secondary hyperparathyroidism of renal origin: Secondary | ICD-10-CM | POA: Diagnosis not present

## 2021-08-20 DIAGNOSIS — N186 End stage renal disease: Secondary | ICD-10-CM | POA: Diagnosis not present

## 2021-08-20 DIAGNOSIS — Z992 Dependence on renal dialysis: Secondary | ICD-10-CM | POA: Diagnosis not present

## 2021-08-22 DIAGNOSIS — Z992 Dependence on renal dialysis: Secondary | ICD-10-CM | POA: Diagnosis not present

## 2021-08-22 DIAGNOSIS — N186 End stage renal disease: Secondary | ICD-10-CM | POA: Diagnosis not present

## 2021-08-22 DIAGNOSIS — N2581 Secondary hyperparathyroidism of renal origin: Secondary | ICD-10-CM | POA: Diagnosis not present

## 2021-08-23 ENCOUNTER — Other Ambulatory Visit: Payer: Self-pay | Admitting: Emergency Medicine

## 2021-08-23 DIAGNOSIS — F331 Major depressive disorder, recurrent, moderate: Secondary | ICD-10-CM

## 2021-08-24 ENCOUNTER — Ambulatory Visit (INDEPENDENT_AMBULATORY_CARE_PROVIDER_SITE_OTHER): Payer: Medicare PPO | Admitting: Physician Assistant

## 2021-08-24 ENCOUNTER — Encounter: Payer: Self-pay | Admitting: Physician Assistant

## 2021-08-24 ENCOUNTER — Ambulatory Visit (HOSPITAL_COMMUNITY)
Admit: 2021-08-24 | Discharge: 2021-08-24 | Disposition: A | Discharge status: Home / Self Care | Payer: Medicare PPO | Attending: Surgery | Admitting: Surgery

## 2021-08-24 ENCOUNTER — Encounter (HOSPITAL_COMMUNITY): Payer: Self-pay

## 2021-08-24 VITALS — BP 96/53 | HR 42 | Temp 98.6°F | Resp 20 | Ht 69.0 in

## 2021-08-24 DIAGNOSIS — N186 End stage renal disease: Secondary | ICD-10-CM

## 2021-08-24 DIAGNOSIS — Z992 Dependence on renal dialysis: Secondary | ICD-10-CM

## 2021-08-24 NOTE — Progress Notes (Signed)
    Postoperative Access Visit   History of Present Illness   Christopher [name] is a 71 y.o. year old male who presents for postoperative follow-up for: right brachiocephalic arteriovenous fistula (Date: 07/08/21).  The patient's wounds are healed.  The patient denies steal symptoms.  The patient is able to complete their activities of daily living.  He is currently dialyzing via left IJ [facility] on a Tuesday Thursday Saturday schedule at the [place] location.  Physical Examination   Vitals:   08/24/21 1446  BP: (!) 96/53  Pulse: (!) 42  Resp: 20  Temp: 98.6 F (37 C)  TempSrc: Temporal  SpO2: 98%  Height: '5\' 9"'$  (1.753 m)   Body mass index is 29.53 kg/m.  right arm Incision is  healed, palpable radial pulse, hand grip is 5/5, sensation in digits is intact, palpable thrill, bruit can be auscultated     Medical Decision Making   Christopher [name] is a 71 y.o. year old male who presents s/p right brachiocephalic arteriovenous fistula  Patent brachiocephalic fistula without signs or symptoms of steal syndrome The patient's access will be ready for use 10/07/21 The patient's tunneled dialysis catheter can be removed when Nephrology is comfortable with the performance of the fistula The patient may follow up on a prn basis   Dagoberto Ligas PA-C [facility] Office: [facility] MD: Trula Slade

## 2021-08-25 DIAGNOSIS — N186 End stage renal disease: Secondary | ICD-10-CM | POA: Diagnosis not present

## 2021-08-25 DIAGNOSIS — N2581 Secondary hyperparathyroidism of renal origin: Secondary | ICD-10-CM | POA: Diagnosis not present

## 2021-08-25 DIAGNOSIS — Z992 Dependence on renal dialysis: Secondary | ICD-10-CM | POA: Diagnosis not present

## 2021-08-27 DIAGNOSIS — N2581 Secondary hyperparathyroidism of renal origin: Secondary | ICD-10-CM | POA: Diagnosis not present

## 2021-08-27 DIAGNOSIS — N186 End stage renal disease: Secondary | ICD-10-CM | POA: Diagnosis not present

## 2021-08-27 DIAGNOSIS — Z992 Dependence on renal dialysis: Secondary | ICD-10-CM | POA: Diagnosis not present

## 2021-08-29 DIAGNOSIS — N2581 Secondary hyperparathyroidism of renal origin: Secondary | ICD-10-CM | POA: Diagnosis not present

## 2021-08-29 DIAGNOSIS — Z992 Dependence on renal dialysis: Secondary | ICD-10-CM | POA: Diagnosis not present

## 2021-08-29 DIAGNOSIS — N186 End stage renal disease: Secondary | ICD-10-CM | POA: Diagnosis not present

## 2021-09-01 DIAGNOSIS — N186 End stage renal disease: Secondary | ICD-10-CM | POA: Diagnosis not present

## 2021-09-01 DIAGNOSIS — Z992 Dependence on renal dialysis: Secondary | ICD-10-CM | POA: Diagnosis not present

## 2021-09-01 DIAGNOSIS — N2581 Secondary hyperparathyroidism of renal origin: Secondary | ICD-10-CM | POA: Diagnosis not present

## 2021-09-03 DIAGNOSIS — Z992 Dependence on renal dialysis: Secondary | ICD-10-CM | POA: Diagnosis not present

## 2021-09-03 DIAGNOSIS — N186 End stage renal disease: Secondary | ICD-10-CM | POA: Diagnosis not present

## 2021-09-03 DIAGNOSIS — N2581 Secondary hyperparathyroidism of renal origin: Secondary | ICD-10-CM | POA: Diagnosis not present

## 2021-09-04 ENCOUNTER — Other Ambulatory Visit: Payer: Self-pay | Admitting: Obstetrics and Gynecology

## 2021-09-05 DIAGNOSIS — N2581 Secondary hyperparathyroidism of renal origin: Secondary | ICD-10-CM | POA: Diagnosis not present

## 2021-09-05 DIAGNOSIS — Z992 Dependence on renal dialysis: Secondary | ICD-10-CM | POA: Diagnosis not present

## 2021-09-05 DIAGNOSIS — N186 End stage renal disease: Secondary | ICD-10-CM | POA: Diagnosis not present

## 2021-09-07 ENCOUNTER — Other Ambulatory Visit: Payer: Self-pay

## 2021-09-07 ENCOUNTER — Telehealth: Payer: Self-pay | Admitting: Emergency Medicine

## 2021-09-07 MED ORDER — PAROXETINE HCL 40 MG PO TABS
40.0000 mg | ORAL_TABLET | Freq: Every day | ORAL | 3 refills | Status: DC
Start: 2021-09-07 — End: 2023-03-29

## 2021-09-07 NOTE — Telephone Encounter (Signed)
Caller & Relationship to patient: Kashaun Bebo  Call back number: [phone]  Date of last office visit: 07/27/21  Date of next office visit: 02/01/22  Medication(s) to be refilled: apixaban (ELIQUIS) 5 MG TABS tablet       Preferred Pharmacy:  [facility] [place], [place] Phone:  [phone]  Fax:  [phone]

## 2021-09-08 DIAGNOSIS — N2581 Secondary hyperparathyroidism of renal origin: Secondary | ICD-10-CM | POA: Diagnosis not present

## 2021-09-08 DIAGNOSIS — Z992 Dependence on renal dialysis: Secondary | ICD-10-CM | POA: Diagnosis not present

## 2021-09-08 DIAGNOSIS — N186 End stage renal disease: Secondary | ICD-10-CM | POA: Diagnosis not present

## 2021-09-10 DIAGNOSIS — Z992 Dependence on renal dialysis: Secondary | ICD-10-CM | POA: Diagnosis not present

## 2021-09-10 DIAGNOSIS — N186 End stage renal disease: Secondary | ICD-10-CM | POA: Diagnosis not present

## 2021-09-10 DIAGNOSIS — N2581 Secondary hyperparathyroidism of renal origin: Secondary | ICD-10-CM | POA: Diagnosis not present

## 2021-09-12 DIAGNOSIS — N186 End stage renal disease: Secondary | ICD-10-CM | POA: Diagnosis not present

## 2021-09-12 DIAGNOSIS — Z992 Dependence on renal dialysis: Secondary | ICD-10-CM | POA: Diagnosis not present

## 2021-09-12 DIAGNOSIS — N2581 Secondary hyperparathyroidism of renal origin: Secondary | ICD-10-CM | POA: Diagnosis not present

## 2021-09-14 ENCOUNTER — Telehealth: Payer: Self-pay | Admitting: Emergency Medicine

## 2021-09-14 NOTE — Telephone Encounter (Signed)
Called patient and left VM , If patient calls back patient needs to continue his Eliquis per provider/ last OV note

## 2021-09-15 DIAGNOSIS — N186 End stage renal disease: Secondary | ICD-10-CM | POA: Diagnosis not present

## 2021-09-15 DIAGNOSIS — Z992 Dependence on renal dialysis: Secondary | ICD-10-CM | POA: Diagnosis not present

## 2021-09-15 DIAGNOSIS — N2581 Secondary hyperparathyroidism of renal origin: Secondary | ICD-10-CM | POA: Diagnosis not present

## 2021-09-16 MED ORDER — APIXABAN 5 MG PO TABS: 5.0000 mg | ORAL_TABLET | Freq: Two times a day (BID) | ORAL | 1 refills | Status: DC

## 2021-09-16 NOTE — Telephone Encounter (Signed)
Pt called and I advised him Dr. Mitchel Honour said to continue his Eliquis.  Pt is requesting a refill on apixaban (ELIQUIS) 5 MG TABS tablet.  Pharmacy:   [facility] [place], [place] Phone:  [phone]  Fax:  [phone]

## 2021-09-16 NOTE — Telephone Encounter (Signed)
New prescription sent to pharmacy on file  

## 2021-09-16 NOTE — Telephone Encounter (Signed)
Okay to fill.  Thanks.

## 2021-09-17 DIAGNOSIS — Z992 Dependence on renal dialysis: Secondary | ICD-10-CM | POA: Diagnosis not present

## 2021-09-17 DIAGNOSIS — N186 End stage renal disease: Secondary | ICD-10-CM | POA: Diagnosis not present

## 2021-09-17 DIAGNOSIS — N2581 Secondary hyperparathyroidism of renal origin: Secondary | ICD-10-CM | POA: Diagnosis not present

## 2021-09-18 DIAGNOSIS — Z992 Dependence on renal dialysis: Secondary | ICD-10-CM | POA: Diagnosis not present

## 2021-09-18 DIAGNOSIS — I129 Hypertensive chronic kidney disease with stage 1 through stage 4 chronic kidney disease, or unspecified chronic kidney disease: Secondary | ICD-10-CM | POA: Diagnosis not present

## 2021-09-18 DIAGNOSIS — N186 End stage renal disease: Secondary | ICD-10-CM | POA: Diagnosis not present

## 2021-09-19 DIAGNOSIS — N186 End stage renal disease: Secondary | ICD-10-CM | POA: Diagnosis not present

## 2021-09-19 DIAGNOSIS — N2581 Secondary hyperparathyroidism of renal origin: Secondary | ICD-10-CM | POA: Diagnosis not present

## 2021-09-19 DIAGNOSIS — Z992 Dependence on renal dialysis: Secondary | ICD-10-CM | POA: Diagnosis not present

## 2021-09-22 DIAGNOSIS — N186 End stage renal disease: Secondary | ICD-10-CM | POA: Diagnosis not present

## 2021-09-22 DIAGNOSIS — Z992 Dependence on renal dialysis: Secondary | ICD-10-CM | POA: Diagnosis not present

## 2021-09-22 DIAGNOSIS — N2581 Secondary hyperparathyroidism of renal origin: Secondary | ICD-10-CM | POA: Diagnosis not present

## 2021-09-24 DIAGNOSIS — N2581 Secondary hyperparathyroidism of renal origin: Secondary | ICD-10-CM | POA: Diagnosis not present

## 2021-09-24 DIAGNOSIS — N186 End stage renal disease: Secondary | ICD-10-CM | POA: Diagnosis not present

## 2021-09-24 DIAGNOSIS — Z992 Dependence on renal dialysis: Secondary | ICD-10-CM | POA: Diagnosis not present

## 2021-09-26 DIAGNOSIS — N186 End stage renal disease: Secondary | ICD-10-CM | POA: Diagnosis not present

## 2021-09-26 DIAGNOSIS — Z992 Dependence on renal dialysis: Secondary | ICD-10-CM | POA: Diagnosis not present

## 2021-09-26 DIAGNOSIS — N2581 Secondary hyperparathyroidism of renal origin: Secondary | ICD-10-CM | POA: Diagnosis not present

## 2021-09-29 DIAGNOSIS — N2581 Secondary hyperparathyroidism of renal origin: Secondary | ICD-10-CM | POA: Diagnosis not present

## 2021-09-29 DIAGNOSIS — Z992 Dependence on renal dialysis: Secondary | ICD-10-CM | POA: Diagnosis not present

## 2021-09-29 DIAGNOSIS — N186 End stage renal disease: Secondary | ICD-10-CM | POA: Diagnosis not present

## 2021-10-01 DIAGNOSIS — N186 End stage renal disease: Secondary | ICD-10-CM | POA: Diagnosis not present

## 2021-10-01 DIAGNOSIS — N2581 Secondary hyperparathyroidism of renal origin: Secondary | ICD-10-CM | POA: Diagnosis not present

## 2021-10-01 DIAGNOSIS — Z992 Dependence on renal dialysis: Secondary | ICD-10-CM | POA: Diagnosis not present

## 2021-10-03 DIAGNOSIS — N2581 Secondary hyperparathyroidism of renal origin: Secondary | ICD-10-CM | POA: Diagnosis not present

## 2021-10-03 DIAGNOSIS — N186 End stage renal disease: Secondary | ICD-10-CM | POA: Diagnosis not present

## 2021-10-03 DIAGNOSIS — Z992 Dependence on renal dialysis: Secondary | ICD-10-CM | POA: Diagnosis not present

## 2021-10-05 ENCOUNTER — Other Ambulatory Visit: Payer: Self-pay | Admitting: Emergency Medicine

## 2021-10-05 DIAGNOSIS — R12 Heartburn: Secondary | ICD-10-CM

## 2021-10-06 DIAGNOSIS — N186 End stage renal disease: Secondary | ICD-10-CM | POA: Diagnosis not present

## 2021-10-06 DIAGNOSIS — N2581 Secondary hyperparathyroidism of renal origin: Secondary | ICD-10-CM | POA: Diagnosis not present

## 2021-10-06 DIAGNOSIS — Z992 Dependence on renal dialysis: Secondary | ICD-10-CM | POA: Diagnosis not present

## 2021-10-08 DIAGNOSIS — Z992 Dependence on renal dialysis: Secondary | ICD-10-CM | POA: Diagnosis not present

## 2021-10-08 DIAGNOSIS — N186 End stage renal disease: Secondary | ICD-10-CM | POA: Diagnosis not present

## 2021-10-08 DIAGNOSIS — N2581 Secondary hyperparathyroidism of renal origin: Secondary | ICD-10-CM | POA: Diagnosis not present

## 2021-10-10 DIAGNOSIS — Z992 Dependence on renal dialysis: Secondary | ICD-10-CM | POA: Diagnosis not present

## 2021-10-10 DIAGNOSIS — N2581 Secondary hyperparathyroidism of renal origin: Secondary | ICD-10-CM | POA: Diagnosis not present

## 2021-10-10 DIAGNOSIS — N186 End stage renal disease: Secondary | ICD-10-CM | POA: Diagnosis not present

## 2021-10-13 DIAGNOSIS — N186 End stage renal disease: Secondary | ICD-10-CM | POA: Diagnosis not present

## 2021-10-13 DIAGNOSIS — N2581 Secondary hyperparathyroidism of renal origin: Secondary | ICD-10-CM | POA: Diagnosis not present

## 2021-10-13 DIAGNOSIS — Z992 Dependence on renal dialysis: Secondary | ICD-10-CM | POA: Diagnosis not present

## 2021-10-15 DIAGNOSIS — Z992 Dependence on renal dialysis: Secondary | ICD-10-CM | POA: Diagnosis not present

## 2021-10-15 DIAGNOSIS — N186 End stage renal disease: Secondary | ICD-10-CM | POA: Diagnosis not present

## 2021-10-15 DIAGNOSIS — N2581 Secondary hyperparathyroidism of renal origin: Secondary | ICD-10-CM | POA: Diagnosis not present

## 2021-10-17 DIAGNOSIS — Z992 Dependence on renal dialysis: Secondary | ICD-10-CM | POA: Diagnosis not present

## 2021-10-17 DIAGNOSIS — N186 End stage renal disease: Secondary | ICD-10-CM | POA: Diagnosis not present

## 2021-10-17 DIAGNOSIS — N2581 Secondary hyperparathyroidism of renal origin: Secondary | ICD-10-CM | POA: Diagnosis not present

## 2021-10-19 DIAGNOSIS — I129 Hypertensive chronic kidney disease with stage 1 through stage 4 chronic kidney disease, or unspecified chronic kidney disease: Secondary | ICD-10-CM | POA: Diagnosis not present

## 2021-10-19 DIAGNOSIS — Z992 Dependence on renal dialysis: Secondary | ICD-10-CM | POA: Diagnosis not present

## 2021-10-19 DIAGNOSIS — N186 End stage renal disease: Secondary | ICD-10-CM | POA: Diagnosis not present

## 2021-10-20 DIAGNOSIS — N2581 Secondary hyperparathyroidism of renal origin: Secondary | ICD-10-CM | POA: Diagnosis not present

## 2021-10-20 DIAGNOSIS — N186 End stage renal disease: Secondary | ICD-10-CM | POA: Diagnosis not present

## 2021-10-20 DIAGNOSIS — Z992 Dependence on renal dialysis: Secondary | ICD-10-CM | POA: Diagnosis not present

## 2021-10-22 DIAGNOSIS — N186 End stage renal disease: Secondary | ICD-10-CM | POA: Diagnosis not present

## 2021-10-22 DIAGNOSIS — N2581 Secondary hyperparathyroidism of renal origin: Secondary | ICD-10-CM | POA: Diagnosis not present

## 2021-10-22 DIAGNOSIS — Z992 Dependence on renal dialysis: Secondary | ICD-10-CM | POA: Diagnosis not present

## 2021-10-24 DIAGNOSIS — N186 End stage renal disease: Secondary | ICD-10-CM | POA: Diagnosis not present

## 2021-10-24 DIAGNOSIS — N2581 Secondary hyperparathyroidism of renal origin: Secondary | ICD-10-CM | POA: Diagnosis not present

## 2021-10-24 DIAGNOSIS — Z992 Dependence on renal dialysis: Secondary | ICD-10-CM | POA: Diagnosis not present

## 2021-10-26 ENCOUNTER — Encounter: Payer: Self-pay | Admitting: Internal Medicine

## 2021-10-26 ENCOUNTER — Ambulatory Visit (INDEPENDENT_AMBULATORY_CARE_PROVIDER_SITE_OTHER): Payer: Medicare PPO | Admitting: Internal Medicine

## 2021-10-26 VITALS — BP 122/66 | HR 56 | Ht 69.0 in | Wt 194.6 lb

## 2021-10-26 DIAGNOSIS — I4891 Unspecified atrial fibrillation: Secondary | ICD-10-CM | POA: Diagnosis not present

## 2021-10-26 NOTE — Progress Notes (Signed)
Cardiology Office Note:    Date:  10/26/2021   ID:  Retia Passe, DOB 02/20/1951, MRN [id number]  PCP:  Horald Pollen, MD   [place] [facility] Cardiologist:  Janina Mayo, MD     Referring MD: Horald Pollen, *   No chief complaint on file. Afib  History of Present Illness:    Christopher [name] is a 71 y.o. male with a hx of HTN, ESRD recent start to IHD Tue, Thur, Sat on 4/22, hx of bladder CA, diagnosed with new onset paroxysmal afib   He was started on BB and DOAC. He converted to sinus rhythm. He just started IHD. Still makes urine. He's on lasix 80 mg BID on non IHD days. He has a new RUE fistula, still using TDC.  He is taking his metorpolol. Blood pressures are well controlled.  Has some leg swelling. He did not feel palpitations. No hx of OSA. TSH nl.   Family hx: father had hx of CAD.   Former smoker  Interim hx 10/26/2021 He denies hospitalizations.  He has no palpitations. He continues with IHD. He denies bleeding. Heart rates 56 today.  Blood pressure is well controlled   Cardiology Studies: EKG 07/06/2021- Afib with RVR 140s EKG 07/07/2021- NSR  07/07/2021 1. Left ventricular ejection fraction, by estimation, is 60 to 65%. The  left ventricle has normal function. The left ventricle has no regional  wall motion abnormalities. Left ventricular diastolic parameters are  consistent with Grade I diastolic  dysfunction (impaired relaxation).   2. Right ventricular systolic function is normal. The right ventricular  size is normal.   3. Left atrial size was mild to moderately dilated.   4. The mitral valve is grossly normal. Mild mitral valve regurgitation.  No evidence of mitral stenosis.   5. The aortic valve is tricuspid. Aortic valve regurgitation is mild. No  aortic stenosis is present.   Past Medical History:  Diagnosis Date   Anxiety    Cancer ([place])    bladder  cancer- stage 1- removed 2007   Chronic kidney disease    CKD     Depression    Hypertension     Past Surgical History:  Procedure Laterality Date   AV FISTULA PLACEMENT Right 07/08/2021   Procedure: RIGHT ARM FISTULA  CREATION;  Surgeon: [name], MD;  Location: [place];  Service: Vascular;  Laterality: Right;   BIOPSY  07/11/2021   Procedure: BIOPSY;  Surgeon: [name], MD;  Location: [place];  Service: Gastroenterology;;   BLADDER SURGERY     COLONOSCOPY     ESOPHAGOGASTRODUODENOSCOPY (EGD) WITH PROPOFOL N/A 07/11/2021   Procedure: ESOPHAGOGASTRODUODENOSCOPY (EGD) WITH PROPOFOL;  Surgeon: [name], MD;  Location: [place];  Service: Gastroenterology;  Laterality: N/A;   INSERTION OF DIALYSIS CATHETER N/A 07/08/2021   Procedure: INSERTION OF DIALYSIS CATHETER;  Surgeon: [name], MD;  Location: [place] OR;  Service: Vascular;  Laterality: N/A;   JOINT REPLACEMENT  2016   knee right    POLYPECTOMY      Current Medications: Current Meds  Medication Sig   apixaban (ELIQUIS) 5 MG TABS tablet Take 1 tablet (5 mg total) by mouth 2 (two) times daily.   furosemide (LASIX) 40 MG tablet Take 40 mg by mouth daily.   metoprolol succinate (TOPROL XL) 25 MG 24 hr tablet Take 1 tablet (25 mg total) by mouth daily.   Multiple Vitamin (MULTIVITAMIN) tablet Take 1 tablet by mouth daily.  Omega-3 Fatty Acids (FISH OIL PO) Take 500 mg by mouth.   pantoprazole (PROTONIX) 40 MG tablet TAKE 1 TABLET(40 MG) BY MOUTH DAILY   PARoxetine (PAXIL) 40 MG tablet Take 1 tablet (40 mg total) by mouth daily.   tamsulosin (FLOMAX) 0.4 MG CAPS capsule Take 0.4 mg by mouth daily.   Current Facility-Administered Medications for the 10/26/21 encounter (Office Visit) with Janina Mayo, MD  Medication   0.9 %  sodium chloride infusion   0.9 %  sodium chloride infusion     Allergies:   Patient has no known allergies.   Social History   Socioeconomic History   Marital status: Married    Spouse name: Not on file   Number of children: Not on  file   Years of education: Not on file   Highest education level: Not on file  Occupational History   Not on file  Tobacco Use   Smoking status: Former    Types: Cigarettes    Quit date: 03/22/1972    Years since quitting: 49.6   Smokeless tobacco: Never  Substance and Sexual Activity   Alcohol use: Yes    Alcohol/week: 0.0 standard drinks of alcohol    Comment: rare   Drug use: No   Sexual activity: Not on file  Other Topics Concern   Not on file  Social History Narrative   Not on file   Social Determinants of Health   Financial Resource Strain: Not on file  Food Insecurity: Not on file  Transportation Needs: Not on file  Physical Activity: Not on file  Stress: Not on file  Social Connections: Not on file     Family History: The patient's family history includes Cancer in his father, mother, and sister; Diabetes in his sister; Heart disease in his father and sister; Hypertension in his father and sister; Mental illness in his brother; Stomach cancer in his father. There is no history of Colon cancer, Colon polyps, Esophageal cancer, or Rectal cancer.  ROS:   Please see the history of present illness.     All other systems reviewed and are negative.  EKGs/Labs/Other Studies Reviewed:    The following studies were reviewed today:   EKG:  EKG is  ordered today.  The ekg ordered today demonstrates   EKG 07/20/2021: Sinus bradycardia HR 52 bpm  Recent Labs: 07/06/2021: B Natriuretic Peptide 199.5 07/07/2021: Magnesium 2.0; TSH 4.185 07/10/2021: ALT 9 07/11/2021: BUN 51; Creatinine, Ser 5.73; Hemoglobin 9.5; Platelets 287; Potassium 3.9; Sodium 136  Recent Lipid Panel    Component Value Date/Time   CHOL 143 10/08/2020 0847   CHOL 164 10/11/2018 1037   TRIG 88.0 10/08/2020 0847   HDL 49.00 10/08/2020 0847   HDL 51 10/11/2018 1037   CHOLHDL 3 10/08/2020 0847   VLDL 17.6 10/08/2020 0847   LDLCALC 77 10/08/2020 0847   LDLCALC 89 10/11/2018 1037     Risk  Assessment/Calculations:    CHA2DS2-VASc Score = 2   This indicates a 2.2% annual risk of stroke. The patient's score is based upon: CHF History: 0 HTN History: 1 Diabetes History: 0 Stroke History: 0 Vascular Disease History: 0 Age Score: 1 Gender Score: 0          Physical Exam:    VS:  BP 122/66   Pulse (!) 56   Ht '5\' 9"'$  (1.753 m)   Wt 194 lb 9.6 oz (88.3 kg)   SpO2 93%   BMI 28.74 kg/m     Wt  Readings from Last 3 Encounters:  10/26/21 194 lb 9.6 oz (88.3 kg)  07/27/21 200 lb (90.7 kg)  07/20/21 202 lb 3.2 oz (91.7 kg)     GEN:  Well nourished, well developed in no acute distress HEENT: Normal NECK: No JVD LYMPHATICS: No lymphadenopathy CARDIAC: bradycardic , no murmurs, rubs, gallops RESPIRATORY:  Clear to auscultation without rales, wheezing or rhonchi  ABDOMEN: Soft, non-tender, non-distended MUSCULOSKELETAL:  No edema; No deformity  SKIN: Warm and dry NEUROLOGIC:  Alert and oriented x 3 PSYCHIATRIC:  Normal affect   ASSESSMENT:    Paroxysmal Afib: Diagnosed earlier this year. In sinus rhythm. Continue rate control strategy with metop. HR 52. Discussed if he has symptoms of bradycardia to let us know, can decrease dosing. Continue eliquis 5 mg BID.  ASCVD: although elevated, no benefit in ESRD to start statin  PLAN:    In order of problems listed above:  Follow up in one year       Medication Adjustments/Labs and Tests Ordered: Current medicines are reviewed at length with the patient today.  Concerns regarding medicines are outlined above.  No orders of the defined types were placed in this encounter.  No orders of the defined types were placed in this encounter.   There are no Patient Instructions on file for this visit.   Signed, Janina Mayo, MD  10/26/2021 1:25 PM    [facility]

## 2021-10-26 NOTE — Patient Instructions (Signed)
Medication Instructions:  Your physician recommends that you continue on your current medications as directed. Please refer to the Current Medication list given to you today.  *If you need a refill on your cardiac medications before your next appointment, please call your pharmacy*   Follow-Up: At [facility], you and your health needs are our priority.  As part of our continuing mission to provide you with exceptional heart care, we have created designated Provider Care Teams.  These Care Teams include your primary Cardiologist (physician) and Advanced Practice Providers (APPs -  Physician Assistants and Nurse Practitioners) who all work together to provide you with the care you need, when you need it.  We recommend signing up for the patient portal called "MyChart".  Sign up information is provided on this After Visit Summary.  MyChart is used to connect with patients for Virtual Visits (Telemedicine).  Patients are able to view lab/test results, encounter notes, upcoming appointments, etc.  Non-urgent messages can be sent to your provider as well.   To learn more about what you can do with MyChart, go to [email or web address].    Your next appointment:   12 month(s)  The format for your next appointment:   In Person  Provider:   Janina Mayo, MD {

## 2021-10-27 DIAGNOSIS — N2581 Secondary hyperparathyroidism of renal origin: Secondary | ICD-10-CM | POA: Diagnosis not present

## 2021-10-27 DIAGNOSIS — N186 End stage renal disease: Secondary | ICD-10-CM | POA: Diagnosis not present

## 2021-10-27 DIAGNOSIS — Z992 Dependence on renal dialysis: Secondary | ICD-10-CM | POA: Diagnosis not present

## 2021-10-28 DIAGNOSIS — Z992 Dependence on renal dialysis: Secondary | ICD-10-CM | POA: Diagnosis not present

## 2021-10-28 DIAGNOSIS — Z452 Encounter for adjustment and management of vascular access device: Secondary | ICD-10-CM | POA: Diagnosis not present

## 2021-10-28 DIAGNOSIS — N186 End stage renal disease: Secondary | ICD-10-CM | POA: Diagnosis not present

## 2021-10-29 DIAGNOSIS — N186 End stage renal disease: Secondary | ICD-10-CM | POA: Diagnosis not present

## 2021-10-29 DIAGNOSIS — N2581 Secondary hyperparathyroidism of renal origin: Secondary | ICD-10-CM | POA: Diagnosis not present

## 2021-10-29 DIAGNOSIS — Z992 Dependence on renal dialysis: Secondary | ICD-10-CM | POA: Diagnosis not present

## 2021-10-31 DIAGNOSIS — N186 End stage renal disease: Secondary | ICD-10-CM | POA: Diagnosis not present

## 2021-10-31 DIAGNOSIS — N2581 Secondary hyperparathyroidism of renal origin: Secondary | ICD-10-CM | POA: Diagnosis not present

## 2021-10-31 DIAGNOSIS — Z992 Dependence on renal dialysis: Secondary | ICD-10-CM | POA: Diagnosis not present

## 2021-11-02 ENCOUNTER — Ambulatory Visit: Payer: Medicare PPO | Admitting: Internal Medicine

## 2021-11-03 DIAGNOSIS — N186 End stage renal disease: Secondary | ICD-10-CM | POA: Diagnosis not present

## 2021-11-03 DIAGNOSIS — N2581 Secondary hyperparathyroidism of renal origin: Secondary | ICD-10-CM | POA: Diagnosis not present

## 2021-11-03 DIAGNOSIS — Z992 Dependence on renal dialysis: Secondary | ICD-10-CM | POA: Diagnosis not present

## 2021-11-05 DIAGNOSIS — N186 End stage renal disease: Secondary | ICD-10-CM | POA: Diagnosis not present

## 2021-11-05 DIAGNOSIS — Z992 Dependence on renal dialysis: Secondary | ICD-10-CM | POA: Diagnosis not present

## 2021-11-05 DIAGNOSIS — N2581 Secondary hyperparathyroidism of renal origin: Secondary | ICD-10-CM | POA: Diagnosis not present

## 2021-11-07 DIAGNOSIS — N2581 Secondary hyperparathyroidism of renal origin: Secondary | ICD-10-CM | POA: Diagnosis not present

## 2021-11-07 DIAGNOSIS — N186 End stage renal disease: Secondary | ICD-10-CM | POA: Diagnosis not present

## 2021-11-07 DIAGNOSIS — Z992 Dependence on renal dialysis: Secondary | ICD-10-CM | POA: Diagnosis not present

## 2021-11-10 DIAGNOSIS — N186 End stage renal disease: Secondary | ICD-10-CM | POA: Diagnosis not present

## 2021-11-10 DIAGNOSIS — Z992 Dependence on renal dialysis: Secondary | ICD-10-CM | POA: Diagnosis not present

## 2021-11-10 DIAGNOSIS — N2581 Secondary hyperparathyroidism of renal origin: Secondary | ICD-10-CM | POA: Diagnosis not present

## 2021-11-12 DIAGNOSIS — Z992 Dependence on renal dialysis: Secondary | ICD-10-CM | POA: Diagnosis not present

## 2021-11-12 DIAGNOSIS — N2581 Secondary hyperparathyroidism of renal origin: Secondary | ICD-10-CM | POA: Diagnosis not present

## 2021-11-12 DIAGNOSIS — N186 End stage renal disease: Secondary | ICD-10-CM | POA: Diagnosis not present

## 2021-11-13 ENCOUNTER — Other Ambulatory Visit: Payer: Self-pay | Admitting: Emergency Medicine

## 2021-11-14 DIAGNOSIS — N186 End stage renal disease: Secondary | ICD-10-CM | POA: Diagnosis not present

## 2021-11-14 DIAGNOSIS — Z992 Dependence on renal dialysis: Secondary | ICD-10-CM | POA: Diagnosis not present

## 2021-11-14 DIAGNOSIS — N2581 Secondary hyperparathyroidism of renal origin: Secondary | ICD-10-CM | POA: Diagnosis not present

## 2021-11-17 DIAGNOSIS — Z992 Dependence on renal dialysis: Secondary | ICD-10-CM | POA: Diagnosis not present

## 2021-11-17 DIAGNOSIS — N186 End stage renal disease: Secondary | ICD-10-CM | POA: Diagnosis not present

## 2021-11-17 DIAGNOSIS — N2581 Secondary hyperparathyroidism of renal origin: Secondary | ICD-10-CM | POA: Diagnosis not present

## 2021-11-18 ENCOUNTER — Telehealth: Payer: Self-pay | Admitting: Emergency Medicine

## 2021-11-18 NOTE — Telephone Encounter (Signed)
Patient needs his pantoprazole refilled - please send to [facility] on [address]

## 2021-11-18 NOTE — Telephone Encounter (Signed)
Called patient and left message for patient pertaining to refills. Patient will need to call [facility] to request a refill. RX sent to pharmacy in July with 3 refills

## 2021-11-19 DIAGNOSIS — Z992 Dependence on renal dialysis: Secondary | ICD-10-CM | POA: Diagnosis not present

## 2021-11-19 DIAGNOSIS — N186 End stage renal disease: Secondary | ICD-10-CM | POA: Diagnosis not present

## 2021-11-19 DIAGNOSIS — N2581 Secondary hyperparathyroidism of renal origin: Secondary | ICD-10-CM | POA: Diagnosis not present

## 2021-11-19 DIAGNOSIS — I129 Hypertensive chronic kidney disease with stage 1 through stage 4 chronic kidney disease, or unspecified chronic kidney disease: Secondary | ICD-10-CM | POA: Diagnosis not present

## 2021-11-24 DIAGNOSIS — N186 End stage renal disease: Secondary | ICD-10-CM | POA: Diagnosis not present

## 2021-11-24 DIAGNOSIS — N2581 Secondary hyperparathyroidism of renal origin: Secondary | ICD-10-CM | POA: Diagnosis not present

## 2021-11-24 DIAGNOSIS — Z992 Dependence on renal dialysis: Secondary | ICD-10-CM | POA: Diagnosis not present

## 2021-11-26 DIAGNOSIS — Z992 Dependence on renal dialysis: Secondary | ICD-10-CM | POA: Diagnosis not present

## 2021-11-26 DIAGNOSIS — N2581 Secondary hyperparathyroidism of renal origin: Secondary | ICD-10-CM | POA: Diagnosis not present

## 2021-11-26 DIAGNOSIS — N186 End stage renal disease: Secondary | ICD-10-CM | POA: Diagnosis not present

## 2021-11-28 DIAGNOSIS — N2581 Secondary hyperparathyroidism of renal origin: Secondary | ICD-10-CM | POA: Diagnosis not present

## 2021-11-28 DIAGNOSIS — Z992 Dependence on renal dialysis: Secondary | ICD-10-CM | POA: Diagnosis not present

## 2021-11-28 DIAGNOSIS — N186 End stage renal disease: Secondary | ICD-10-CM | POA: Diagnosis not present

## 2021-12-01 DIAGNOSIS — N2581 Secondary hyperparathyroidism of renal origin: Secondary | ICD-10-CM | POA: Diagnosis not present

## 2021-12-01 DIAGNOSIS — Z992 Dependence on renal dialysis: Secondary | ICD-10-CM | POA: Diagnosis not present

## 2021-12-01 DIAGNOSIS — N186 End stage renal disease: Secondary | ICD-10-CM | POA: Diagnosis not present

## 2021-12-03 DIAGNOSIS — N2581 Secondary hyperparathyroidism of renal origin: Secondary | ICD-10-CM | POA: Diagnosis not present

## 2021-12-03 DIAGNOSIS — N186 End stage renal disease: Secondary | ICD-10-CM | POA: Diagnosis not present

## 2021-12-03 DIAGNOSIS — Z992 Dependence on renal dialysis: Secondary | ICD-10-CM | POA: Diagnosis not present

## 2021-12-05 DIAGNOSIS — Z992 Dependence on renal dialysis: Secondary | ICD-10-CM | POA: Diagnosis not present

## 2021-12-05 DIAGNOSIS — N2581 Secondary hyperparathyroidism of renal origin: Secondary | ICD-10-CM | POA: Diagnosis not present

## 2021-12-05 DIAGNOSIS — N186 End stage renal disease: Secondary | ICD-10-CM | POA: Diagnosis not present

## 2021-12-08 DIAGNOSIS — N186 End stage renal disease: Secondary | ICD-10-CM | POA: Diagnosis not present

## 2021-12-08 DIAGNOSIS — N2581 Secondary hyperparathyroidism of renal origin: Secondary | ICD-10-CM | POA: Diagnosis not present

## 2021-12-08 DIAGNOSIS — Z992 Dependence on renal dialysis: Secondary | ICD-10-CM | POA: Diagnosis not present

## 2021-12-10 DIAGNOSIS — N186 End stage renal disease: Secondary | ICD-10-CM | POA: Diagnosis not present

## 2021-12-10 DIAGNOSIS — N2581 Secondary hyperparathyroidism of renal origin: Secondary | ICD-10-CM | POA: Diagnosis not present

## 2021-12-10 DIAGNOSIS — Z992 Dependence on renal dialysis: Secondary | ICD-10-CM | POA: Diagnosis not present

## 2021-12-12 DIAGNOSIS — N186 End stage renal disease: Secondary | ICD-10-CM | POA: Diagnosis not present

## 2021-12-12 DIAGNOSIS — Z992 Dependence on renal dialysis: Secondary | ICD-10-CM | POA: Diagnosis not present

## 2021-12-12 DIAGNOSIS — N2581 Secondary hyperparathyroidism of renal origin: Secondary | ICD-10-CM | POA: Diagnosis not present

## 2021-12-14 ENCOUNTER — Ambulatory Visit (INDEPENDENT_AMBULATORY_CARE_PROVIDER_SITE_OTHER): Payer: Medicare PPO

## 2021-12-14 VITALS — Ht 69.0 in | Wt 200.0 lb

## 2021-12-14 DIAGNOSIS — Z0100 Encounter for examination of eyes and vision without abnormal findings: Secondary | ICD-10-CM

## 2021-12-14 DIAGNOSIS — Z0000 Encounter for general adult medical examination without abnormal findings: Secondary | ICD-10-CM

## 2021-12-14 NOTE — Progress Notes (Signed)
Subjective:   Christopher [name] is a 71 y.o. male who presents for Medicare Annual/Subsequent preventive examination.   Virtual Visit via Telephone Note  I connected with  Christopher [name] on 12/14/21 at 10:15 AM EDT by telephone and verified that I am speaking with the correct person using two identifiers.  Location: Patient: home  Provider: [facility]  Persons participating in the virtual visit: patient/Nurse Health Advisor   I discussed the limitations, risks, security and privacy concerns of performing an evaluation and management service by telephone and the availability of in person appointments. The patient expressed understanding and agreed to proceed.  Interactive audio and video telecommunications were attempted between this nurse and patient, however failed, due to patient having technical difficulties OR patient did not have access to video capability.  We continued and completed visit with audio only.  Some vital signs may be absent or patient reported.   Daphane Shepherd, LPN  Review of Systems     Cardiac Risk Factors include: advanced age (>49mn, >>62women)     Objective:    Today's Vitals   12/14/21 1015  Weight: 200 lb (90.7 kg)  Height: '5\' 9"'$  (1.753 m)   Body mass index is 29.53 kg/m.     12/14/2021   10:24 AM 07/07/2021    4:44 AM 01/15/2019    1:09 PM 08/01/2014   12:48 PM  Advanced Directives  Does Patient Have a Medical Advance Directive? No No Yes Yes  Does patient want to make changes to medical advance directive?   No - Patient declined   Copy of [place] Chart?   No - copy requested   Would patient like information on creating a medical advance directive? No - Patient declined No - Patient declined      Current Medications (verified) Outpatient Encounter Medications as of 12/14/2021  Medication Sig   ELIQUIS 5 MG TABS tablet TAKE 1 TABLET(5 MG) BY MOUTH TWICE DAILY   metoprolol succinate (TOPROL XL) 25 MG 24 hr  tablet Take 1 tablet (25 mg total) by mouth daily.   Multiple Vitamin (MULTIVITAMIN) tablet Take 1 tablet by mouth daily.   Omega-3 Fatty Acids (FISH OIL PO) Take 500 mg by mouth.   pantoprazole (PROTONIX) 40 MG tablet TAKE 1 TABLET(40 MG) BY MOUTH DAILY   PARoxetine (PAXIL) 40 MG tablet Take 1 tablet (40 mg total) by mouth daily.   tamsulosin (FLOMAX) 0.4 MG CAPS capsule Take 0.4 mg by mouth daily.   furosemide (LASIX) 40 MG tablet Take 40 mg by mouth daily. (Patient not taking: Reported on 12/14/2021)   Facility-Administered Encounter Medications as of 12/14/2021  Medication   0.9 %  sodium chloride infusion   0.9 %  sodium chloride infusion    Allergies (verified) Patient has no known allergies.   History: Past Medical History:  Diagnosis Date   Anxiety    [place]    bladder  cancer- stage 1- removed 2007   Chronic kidney disease    CKD    Depression    Hypertension    Past Surgical History:  Procedure Laterality Date   AV FISTULA PLACEMENT Right 07/08/2021   Procedure: RIGHT ARM FISTULA  CREATION;  Surgeon: BSerafina Mitchell MD;  [place]  Service: Vascular;  Laterality: Right;   BIOPSY  07/11/2021   Procedure: BIOPSY;  Surgeon: BThornton Park MD;  [place] [facility]  Service: Gastroenterology;;   BLADDER SURGERY     COLONOSCOPY  ESOPHAGOGASTRODUODENOSCOPY (EGD) WITH PROPOFOL N/A 07/11/2021   Procedure: ESOPHAGOGASTRODUODENOSCOPY (EGD) WITH PROPOFOL;  Surgeon: [name], MD;  Location: [facility];  Service: Gastroenterology;  Laterality: N/A;   INSERTION OF DIALYSIS CATHETER N/A 07/08/2021   Procedure: INSERTION OF DIALYSIS CATHETER;  Surgeon: [name], MD;  [facility];  Service: Vascular;  Laterality: N/A;   JOINT REPLACEMENT  2016   knee right    POLYPECTOMY     Family History  Problem Relation Age of Onset   Cancer Mother    Cancer Father    Hypertension Father    Heart disease Father    Stomach cancer Father     Cancer Sister    Diabetes Sister    Heart disease Sister    Hypertension Sister    Mental illness Brother    Colon cancer Neg Hx    Colon polyps Neg Hx    Esophageal cancer Neg Hx    Rectal cancer Neg Hx    Social History   Socioeconomic History   Marital status: Married    Spouse name: Not on file   Number of children: Not on file   Years of education: Not on file   Highest education level: Not on file  Occupational History   Not on file  Tobacco Use   Smoking status: Former    Types: Cigarettes    Quit date: 03/22/1972    Years since quitting: 49.7   Smokeless tobacco: Never  Substance and Sexual Activity   Alcohol use: Yes    Alcohol/week: 0.0 standard drinks of alcohol    Comment: rare   Drug use: No   Sexual activity: Not on file  Other Topics Concern   Not on file  Social History Narrative   Not on file   Social Determinants of Health   Financial Resource Strain: Low Risk  (12/14/2021)   Overall Financial Resource Strain (CARDIA)    Difficulty of Paying Living Expenses: Not hard at all  Food Insecurity: No Food Insecurity (12/14/2021)   Hunger Vital Sign    Worried About Running Out of Food in the Last Year: Never true    Ran Out of Food in the Last Year: Never true  Transportation Needs: No Transportation Needs (12/14/2021)   PRAPARE - Hydrologist (Medical): No    Lack of Transportation (Non-Medical): No  Physical Activity: Inactive (12/14/2021)   Exercise Vital Sign    Days of Exercise per Week: 0 days    Minutes of Exercise per Session: 0 min  Stress: No Stress Concern Present (12/14/2021)   [place]    Feeling of Stress : Not at all  Social Connections: Moderately Isolated (12/14/2021)   Social Connection and Isolation Panel [NHANES]    Frequency of Communication with Friends and Family: More than three times a week    Frequency of Social Gatherings with  Friends and Family: More than three times a week    Attends Religious Services: Never    Marine scientist or Organizations: No    Attends Music therapist: Never    Marital Status: Married    Tobacco Counseling Counseling given: Not Answered   Clinical Intake:  Pre-visit preparation completed: Yes  Pain : No/denies pain     Nutritional Risks: None Diabetes: No  How often do you need to have someone help you when you read instructions, pamphlets, or other written materials from your  doctor or pharmacy?: 1 - Never  Diabetic?no   Interpreter Needed?: No  Information entered by :: Jadene Pierini, LPN   Activities of Daily Living    12/14/2021   10:24 AM  In your present state of health, do you have any difficulty performing the following activities:  Hearing? 0  Vision? 0  Difficulty concentrating or making decisions? 0  Walking or climbing stairs? 0  Dressing or bathing? 0  Doing errands, shopping? 0  Preparing Food and eating ? N  Using the Toilet? N  In the past six months, have you accidently leaked urine? N  Do you have problems with loss of bowel control? N  Managing your Medications? N  Managing your Finances? N  Housekeeping or managing your Housekeeping? N    Patient Care Team: Horald Pollen, MD as PCP - General (Internal Medicine) Janina Mayo, MD as PCP - Cardiology (Cardiology)  Indicate any recent Medical Services you may have received from other than [facility] providers in the past year (date may be approximate).     Assessment:   This is a routine wellness examination for Christopher [name].  Hearing/Vision screen Vision Screening - Comments:: Referral 12/14/2021  Dietary issues and exercise activities discussed: Current Exercise Habits: The patient does not participate in regular exercise at present, Exercise limited by: Other - see comments (dialysis)   Goals Addressed             This Visit's Progress    Exercise 3x per  week (30 min per time)         Depression Screen    12/14/2021   10:22 AM 07/27/2021    3:38 PM 02/17/2021    1:45 PM 12/05/2020    6:11 PM 10/08/2020    8:17 AM 04/10/2020   10:14 AM 04/16/2019   11:09 AM  PHQ 2/9 Scores  PHQ - 2 Score 0 4 0 0 2 0 0  PHQ- 9 Score  16  0 7  4    Fall Risk    12/14/2021   10:17 AM 07/27/2021    3:38 PM 02/17/2021    1:45 PM 12/05/2020    6:11 PM 04/10/2020   10:13 AM  Fall Risk   Falls in the past year? 0 0 0 0 0  Number falls in past yr: 0  0 0   Injury with Fall? 0  0 0   Risk for fall due to : No Fall Risks   No Fall Risks   Follow up Falls prevention discussed   Falls evaluation completed Falls evaluation completed    [place]:  Any stairs in or around the home? No  If so, are there any without handrails? No  Home free of loose throw rugs in walkways, pet beds, electrical cords, etc? Yes  Adequate lighting in your home to reduce risk of falls? Yes   ASSISTIVE DEVICES UTILIZED TO PREVENT FALLS:  Life alert? No  Use of a cane, walker or w/c? No  Grab bars in the bathroom? No  Shower chair or bench in shower? No  Elevated toilet seat or a handicapped toilet? No       05/16/2019   10:12 AM  MMSE - Mini Mental State Exam  Orientation to time 5  Orientation to Place 5  Registration 3  Attention/ Calculation 0  Recall 3  Language- name 2 objects 2  Language- repeat 1  Language- follow 3 step command 3  Language-  read & follow direction 1  Write a sentence 1  Copy design 1  Total score 25        12/14/2021   10:25 AM 12/05/2020    6:13 PM 04/16/2019   10:16 AM 01/15/2019    1:10 PM  6CIT Screen  What Year? 0 points 0 points 0 points 0 points  What month? 0 points 0 points 0 points 0 points  What time? 0 points 0 points 0 points 0 points  Count back from 20 0 points 0 points 0 points 0 points  Months in reverse 0 points 4 points 0 points 0 points  Repeat phrase 2 points 0 points 0 points 6  points  Total Score 2 points 4 points 0 points 6 points    Immunizations Immunization History  Administered Date(s) Administered   Fluad Quad(high Dose 65+) 12/20/2019   Influenza Inj Mdck Quad With Preservative 01/13/2018   Influenza, High Dose Seasonal PF 12/27/2018   Influenza-Unspecified 01/17/2021   Moderna Sars-Covid-2 Vaccination 06/09/2019, 07/07/2019   Pneumococcal Conjugate-13 10/11/2018    TDAP status: Due, Education has been provided regarding the importance of this vaccine. Advised may receive this vaccine at local pharmacy or Health Dept. Aware to provide a copy of the vaccination record if obtained from local pharmacy or Health Dept. Verbalized acceptance and understanding.  Flu Vaccine status: Due, Education has been provided regarding the importance of this vaccine. Advised may receive this vaccine at local pharmacy or Health Dept. Aware to provide a copy of the vaccination record if obtained from local pharmacy or Health Dept. Verbalized acceptance and understanding.  Pneumococcal vaccine status: Up to date  Covid-19 vaccine status: Completed vaccines  Qualifies for Shingles Vaccine? Yes   Zostavax completed No   Shingrix Completed?: No.    Education has been provided regarding the importance of this vaccine. Patient has been advised to call insurance company to determine out of pocket expense if they have not yet received this vaccine. Advised may also receive vaccine at local pharmacy or Health Dept. Verbalized acceptance and understanding.  Screening Tests Health Maintenance  Topic Date Due   Hepatitis C Screening  Never done   Zoster Vaccines- Shingrix (1 of 2) Never done   TETANUS/TDAP  08/20/2016   Pneumonia Vaccine 51+ Years old (2 - PPSV23 or PCV20) 12/06/2018   COVID-19 Vaccine (3 - Moderna risk series) 08/04/2019   INFLUENZA VACCINE  10/20/2021   COLONOSCOPY (Pts 45-92yr Insurance coverage will need to be confirmed)  12/10/2024   HPV VACCINES  Aged  Out    Health Maintenance  Health Maintenance Due  Topic Date Due   Hepatitis C Screening  Never done   Zoster Vaccines- Shingrix (1 of 2) Never done   TETANUS/TDAP  08/20/2016   Pneumonia Vaccine 71 Years old (2 - PPSV23 or PCV20) 12/06/2018   COVID-19 Vaccine (3 - Moderna risk series) 08/04/2019   INFLUENZA VACCINE  10/20/2021    Colorectal cancer screening: Type of screening: Colonoscopy. Completed 12/11/2019. Repeat every 5 years  Lung Cancer Screening: (Low Dose CT Chest recommended if Age 71-80years, 30 pack-year currently smoking OR have quit w/in 15years.) does not qualify.   Lung Cancer Screening Referral: n/a  Additional Screening:  Hepatitis C Screening: does not qualify;   Vision Screening: Recommended annual ophthalmology exams for early detection of glaucoma and other disorders of the eye. Is the patient up to date with their annual eye exam?  No  Who is the provider or what  is the name of the office in which the patient attends annual eye exams? None , referral 12/14/2021 If pt is not established with a provider, would they like to be referred to a provider to establish care? No .   Dental Screening: Recommended annual dental exams for proper oral hygiene  Community Resource Referral / Chronic Care Management: CRR required this visit?  No   CCM required this visit?  No      Plan:     I have personally reviewed and noted the following in the patient's chart:   Medical and social history Use of alcohol, tobacco or illicit drugs  Current medications and supplements including opioid prescriptions. Patient is not currently taking opioid prescriptions. Functional ability and status Nutritional status Physical activity Advanced directives List of other physicians Hospitalizations, surgeries, and ER visits in previous 12 months Vitals Screenings to include cognitive, depression, and falls Referrals and appointments  In addition, I have reviewed and  discussed with patient certain preventive protocols, quality metrics, and best practice recommendations. A written personalized care plan for preventive services as well as general preventive health recommendations were provided to patient.     Daphane Shepherd, LPN   3/76/2831   Nurse Notes: due TDAP/ Flu vaccine  Referral for eye doctor 12/14/2021

## 2021-12-14 NOTE — Patient Instructions (Signed)
Christopher [name] , Thank you for taking time to come for your Medicare Wellness Visit. I appreciate your ongoing commitment to your health goals. Please review the following plan we discussed and let me know if I can assist you in the future.   These are the goals we discussed:  Goals       Exercise 3x per week (30 min per time)      Patient Stated (pt-stated)      More exercise         This is a list of the screening recommended for you and due dates:  Health Maintenance  Topic Date Due   Hepatitis C Screening: USPSTF Recommendation to screen - Ages 68-79 yo.  Never done   Zoster (Shingles) Vaccine (1 of 2) Never done   Tetanus Vaccine  08/20/2016   Pneumonia Vaccine (2 - PPSV23 or PCV20) 12/06/2018   COVID-19 Vaccine (3 - Moderna risk series) 08/04/2019   Flu Shot  10/20/2021   Colon Cancer Screening  12/10/2024   HPV Vaccine  Aged Out    Advanced directives: Advance directive discussed with you today. I have provided a copy for you to complete at home and have notarized. Once this is complete please bring a copy in to our office so we can scan it into your chart.   Conditions/risks identified: Aim for 30 minutes of exercise or brisk walking, 6-8 glasses of water, and 5 servings of fruits and vegetables each day.   Next appointment: Follow up in one year for your annual wellness visit.   Preventive Care 75 Years and Older, Male  Preventive care refers to lifestyle choices and visits with your health care provider that can promote health and wellness. What does preventive care include? A yearly physical exam. This is also called an annual well check. Dental exams once or twice a year. Routine eye exams. Ask your health care provider how often you should have your eyes checked. Personal lifestyle choices, including: Daily care of your teeth and gums. Regular physical activity. Eating a healthy diet. Avoiding tobacco and drug use. Limiting alcohol use. Practicing safe  sex. Taking low doses of aspirin every day. Taking vitamin and mineral supplements as recommended by your health care provider. What happens during an annual well check? The services and screenings done by your health care provider during your annual well check will depend on your age, overall health, lifestyle risk factors, and family history of disease. Counseling  Your health care provider may ask you questions about your: Alcohol use. Tobacco use. Drug use. Emotional well-being. Home and relationship well-being. Sexual activity. Eating habits. History of falls. Memory and ability to understand (cognition). Work and work Statistician. Screening  You may have the following tests or measurements: Height, weight, and BMI. Blood pressure. Lipid and cholesterol levels. These may be checked every 5 years, or more frequently if you are over 98 years old. Skin check. Lung cancer screening. You may have this screening every year starting at age 41 if you have a 30-pack-year history of smoking and currently smoke or have quit within the past 15 years. Fecal occult blood test (FOBT) of the stool. You may have this test every year starting at age 34. Flexible sigmoidoscopy or colonoscopy. You may have a sigmoidoscopy every 5 years or a colonoscopy every 10 years starting at age 82. Prostate cancer screening. Recommendations will vary depending on your family history and other risks. Hepatitis C blood test. Hepatitis B blood test. Sexually transmitted  disease (STD) testing. Diabetes screening. This is done by checking your blood sugar (glucose) after you have not eaten for a while (fasting). You may have this done every 1-3 years. Abdominal aortic aneurysm (AAA) screening. You may need this if you are a current or former smoker. Osteoporosis. You may be screened starting at age 75 if you are at high risk. Talk with your health care provider about your test results, treatment options, and if  necessary, the need for more tests. Vaccines  Your health care provider may recommend certain vaccines, such as: Influenza vaccine. This is recommended every year. Tetanus, diphtheria, and acellular pertussis (Tdap, Td) vaccine. You may need a Td booster every 10 years. Zoster vaccine. You may need this after age 56. Pneumococcal 13-valent conjugate (PCV13) vaccine. One dose is recommended after age 14. Pneumococcal polysaccharide (PPSV23) vaccine. One dose is recommended after age 72. Talk to your health care provider about which screenings and vaccines you need and how often you need them. This information is not intended to replace advice given to you by your health care provider. Make sure you discuss any questions you have with your health care provider. Document Released: 04/04/2015 Document Revised: 11/26/2015 Document Reviewed: 01/07/2015 Elsevier Interactive Patient Education  2017 [place] Prevention in the Home Falls can cause injuries. They can happen to people of all ages. There are many things you can do to make your home safe and to help prevent falls. What can I do on the outside of my home? Regularly fix the edges of walkways and driveways and fix any cracks. Remove anything that might make you trip as you walk through a door, such as a raised step or threshold. Trim any bushes or trees on the path to your home. Use bright outdoor lighting. Clear any walking paths of anything that might make someone trip, such as rocks or tools. Regularly check to see if handrails are loose or broken. Make sure that both sides of any steps have handrails. Any raised decks and porches should have guardrails on the edges. Have any leaves, snow, or ice cleared regularly. Use sand or salt on walking paths during winter. Clean up any spills in your garage right away. This includes oil or grease spills. What can I do in the bathroom? Use night lights. Install grab bars by the toilet  and in the tub and shower. Do not use towel bars as grab bars. Use non-skid mats or decals in the tub or shower. If you need to sit down in the shower, use a plastic, non-slip stool. Keep the floor dry. Clean up any water that spills on the floor as soon as it happens. Remove soap buildup in the tub or shower regularly. Attach bath mats securely with double-sided non-slip rug tape. Do not have throw rugs and other things on the floor that can make you trip. What can I do in the bedroom? Use night lights. Make sure that you have a light by your bed that is easy to reach. Do not use any sheets or blankets that are too big for your bed. They should not hang down onto the floor. Have a firm chair that has side arms. You can use this for support while you get dressed. Do not have throw rugs and other things on the floor that can make you trip. What can I do in the kitchen? Clean up any spills right away. Avoid walking on wet floors. Keep items that you use a  lot in easy-to-reach places. If you need to reach something above you, use a strong step stool that has a grab bar. Keep electrical cords out of the way. Do not use floor polish or wax that makes floors slippery. If you must use wax, use non-skid floor wax. Do not have throw rugs and other things on the floor that can make you trip. What can I do with my stairs? Do not leave any items on the stairs. Make sure that there are handrails on both sides of the stairs and use them. Fix handrails that are broken or loose. Make sure that handrails are as long as the stairways. Check any carpeting to make sure that it is firmly attached to the stairs. Fix any carpet that is loose or worn. Avoid having throw rugs at the top or bottom of the stairs. If you do have throw rugs, attach them to the floor with carpet tape. Make sure that you have a light switch at the top of the stairs and the bottom of the stairs. If you do not have them, ask someone to add  them for you. What else can I do to help prevent falls? Wear shoes that: Do not have high heels. Have rubber bottoms. Are comfortable and fit you well. Are closed at the toe. Do not wear sandals. If you use a stepladder: Make sure that it is fully opened. Do not climb a closed stepladder. Make sure that both sides of the stepladder are locked into place. Ask someone to hold it for you, if possible. Clearly mark and make sure that you can see: Any grab bars or handrails. First and last steps. Where the edge of each step is. Use tools that help you move around (mobility aids) if they are needed. These include: Canes. Walkers. Scooters. Crutches. Turn on the lights when you go into a dark area. Replace any light bulbs as soon as they burn out. Set up your furniture so you have a clear path. Avoid moving your furniture around. If any of your floors are uneven, fix them. If there are any pets around you, be aware of where they are. Review your medicines with your doctor. Some medicines can make you feel dizzy. This can increase your chance of falling. Ask your doctor what other things that you can do to help prevent falls. This information is not intended to replace advice given to you by your health care provider. Make sure you discuss any questions you have with your health care provider. Document Released: 01/02/2009 Document Revised: 08/14/2015 Document Reviewed: 04/12/2014 Elsevier Interactive Patient Education  2017 [employer].

## 2021-12-15 DIAGNOSIS — N2581 Secondary hyperparathyroidism of renal origin: Secondary | ICD-10-CM | POA: Diagnosis not present

## 2021-12-15 DIAGNOSIS — N186 End stage renal disease: Secondary | ICD-10-CM | POA: Diagnosis not present

## 2021-12-15 DIAGNOSIS — Z992 Dependence on renal dialysis: Secondary | ICD-10-CM | POA: Diagnosis not present

## 2021-12-17 DIAGNOSIS — N2581 Secondary hyperparathyroidism of renal origin: Secondary | ICD-10-CM | POA: Diagnosis not present

## 2021-12-17 DIAGNOSIS — Z992 Dependence on renal dialysis: Secondary | ICD-10-CM | POA: Diagnosis not present

## 2021-12-17 DIAGNOSIS — N186 End stage renal disease: Secondary | ICD-10-CM | POA: Diagnosis not present

## 2021-12-19 DIAGNOSIS — N186 End stage renal disease: Secondary | ICD-10-CM | POA: Diagnosis not present

## 2021-12-19 DIAGNOSIS — N2581 Secondary hyperparathyroidism of renal origin: Secondary | ICD-10-CM | POA: Diagnosis not present

## 2021-12-19 DIAGNOSIS — Z992 Dependence on renal dialysis: Secondary | ICD-10-CM | POA: Diagnosis not present

## 2021-12-19 DIAGNOSIS — I129 Hypertensive chronic kidney disease with stage 1 through stage 4 chronic kidney disease, or unspecified chronic kidney disease: Secondary | ICD-10-CM | POA: Diagnosis not present

## 2021-12-22 DIAGNOSIS — Z992 Dependence on renal dialysis: Secondary | ICD-10-CM | POA: Diagnosis not present

## 2021-12-22 DIAGNOSIS — N186 End stage renal disease: Secondary | ICD-10-CM | POA: Diagnosis not present

## 2021-12-22 DIAGNOSIS — N2581 Secondary hyperparathyroidism of renal origin: Secondary | ICD-10-CM | POA: Diagnosis not present

## 2021-12-24 DIAGNOSIS — N186 End stage renal disease: Secondary | ICD-10-CM | POA: Diagnosis not present

## 2021-12-24 DIAGNOSIS — N2581 Secondary hyperparathyroidism of renal origin: Secondary | ICD-10-CM | POA: Diagnosis not present

## 2021-12-24 DIAGNOSIS — Z992 Dependence on renal dialysis: Secondary | ICD-10-CM | POA: Diagnosis not present

## 2021-12-26 DIAGNOSIS — N2581 Secondary hyperparathyroidism of renal origin: Secondary | ICD-10-CM | POA: Diagnosis not present

## 2021-12-26 DIAGNOSIS — N186 End stage renal disease: Secondary | ICD-10-CM | POA: Diagnosis not present

## 2021-12-26 DIAGNOSIS — Z992 Dependence on renal dialysis: Secondary | ICD-10-CM | POA: Diagnosis not present

## 2021-12-29 DIAGNOSIS — Z992 Dependence on renal dialysis: Secondary | ICD-10-CM | POA: Diagnosis not present

## 2021-12-29 DIAGNOSIS — N186 End stage renal disease: Secondary | ICD-10-CM | POA: Diagnosis not present

## 2021-12-29 DIAGNOSIS — N2581 Secondary hyperparathyroidism of renal origin: Secondary | ICD-10-CM | POA: Diagnosis not present

## 2021-12-31 DIAGNOSIS — N186 End stage renal disease: Secondary | ICD-10-CM | POA: Diagnosis not present

## 2021-12-31 DIAGNOSIS — N2581 Secondary hyperparathyroidism of renal origin: Secondary | ICD-10-CM | POA: Diagnosis not present

## 2021-12-31 DIAGNOSIS — Z992 Dependence on renal dialysis: Secondary | ICD-10-CM | POA: Diagnosis not present

## 2022-01-02 DIAGNOSIS — N186 End stage renal disease: Secondary | ICD-10-CM | POA: Diagnosis not present

## 2022-01-02 DIAGNOSIS — N2581 Secondary hyperparathyroidism of renal origin: Secondary | ICD-10-CM | POA: Diagnosis not present

## 2022-01-02 DIAGNOSIS — Z992 Dependence on renal dialysis: Secondary | ICD-10-CM | POA: Diagnosis not present

## 2022-01-05 DIAGNOSIS — N186 End stage renal disease: Secondary | ICD-10-CM | POA: Diagnosis not present

## 2022-01-05 DIAGNOSIS — Z992 Dependence on renal dialysis: Secondary | ICD-10-CM | POA: Diagnosis not present

## 2022-01-05 DIAGNOSIS — N2581 Secondary hyperparathyroidism of renal origin: Secondary | ICD-10-CM | POA: Diagnosis not present

## 2022-01-07 DIAGNOSIS — Z992 Dependence on renal dialysis: Secondary | ICD-10-CM | POA: Diagnosis not present

## 2022-01-07 DIAGNOSIS — N2581 Secondary hyperparathyroidism of renal origin: Secondary | ICD-10-CM | POA: Diagnosis not present

## 2022-01-07 DIAGNOSIS — N186 End stage renal disease: Secondary | ICD-10-CM | POA: Diagnosis not present

## 2022-01-09 DIAGNOSIS — Z992 Dependence on renal dialysis: Secondary | ICD-10-CM | POA: Diagnosis not present

## 2022-01-09 DIAGNOSIS — N2581 Secondary hyperparathyroidism of renal origin: Secondary | ICD-10-CM | POA: Diagnosis not present

## 2022-01-09 DIAGNOSIS — N186 End stage renal disease: Secondary | ICD-10-CM | POA: Diagnosis not present

## 2022-01-11 ENCOUNTER — Other Ambulatory Visit: Payer: Self-pay | Admitting: Emergency Medicine

## 2022-01-12 DIAGNOSIS — Z992 Dependence on renal dialysis: Secondary | ICD-10-CM | POA: Diagnosis not present

## 2022-01-12 DIAGNOSIS — N2581 Secondary hyperparathyroidism of renal origin: Secondary | ICD-10-CM | POA: Diagnosis not present

## 2022-01-12 DIAGNOSIS — N186 End stage renal disease: Secondary | ICD-10-CM | POA: Diagnosis not present

## 2022-01-14 DIAGNOSIS — N2581 Secondary hyperparathyroidism of renal origin: Secondary | ICD-10-CM | POA: Diagnosis not present

## 2022-01-14 DIAGNOSIS — N186 End stage renal disease: Secondary | ICD-10-CM | POA: Diagnosis not present

## 2022-01-14 DIAGNOSIS — Z992 Dependence on renal dialysis: Secondary | ICD-10-CM | POA: Diagnosis not present

## 2022-01-16 DIAGNOSIS — Z992 Dependence on renal dialysis: Secondary | ICD-10-CM | POA: Diagnosis not present

## 2022-01-16 DIAGNOSIS — N186 End stage renal disease: Secondary | ICD-10-CM | POA: Diagnosis not present

## 2022-01-16 DIAGNOSIS — N2581 Secondary hyperparathyroidism of renal origin: Secondary | ICD-10-CM | POA: Diagnosis not present

## 2022-01-19 DIAGNOSIS — I129 Hypertensive chronic kidney disease with stage 1 through stage 4 chronic kidney disease, or unspecified chronic kidney disease: Secondary | ICD-10-CM | POA: Diagnosis not present

## 2022-01-19 DIAGNOSIS — N186 End stage renal disease: Secondary | ICD-10-CM | POA: Diagnosis not present

## 2022-01-19 DIAGNOSIS — N2581 Secondary hyperparathyroidism of renal origin: Secondary | ICD-10-CM | POA: Diagnosis not present

## 2022-01-19 DIAGNOSIS — Z992 Dependence on renal dialysis: Secondary | ICD-10-CM | POA: Diagnosis not present

## 2022-01-21 ENCOUNTER — Other Ambulatory Visit: Payer: Self-pay | Admitting: Emergency Medicine

## 2022-01-21 DIAGNOSIS — N2581 Secondary hyperparathyroidism of renal origin: Secondary | ICD-10-CM | POA: Diagnosis not present

## 2022-01-21 DIAGNOSIS — R12 Heartburn: Secondary | ICD-10-CM

## 2022-01-21 DIAGNOSIS — N186 End stage renal disease: Secondary | ICD-10-CM | POA: Diagnosis not present

## 2022-01-21 DIAGNOSIS — Z992 Dependence on renal dialysis: Secondary | ICD-10-CM | POA: Diagnosis not present

## 2022-01-23 DIAGNOSIS — Z992 Dependence on renal dialysis: Secondary | ICD-10-CM | POA: Diagnosis not present

## 2022-01-23 DIAGNOSIS — N2581 Secondary hyperparathyroidism of renal origin: Secondary | ICD-10-CM | POA: Diagnosis not present

## 2022-01-23 DIAGNOSIS — N186 End stage renal disease: Secondary | ICD-10-CM | POA: Diagnosis not present

## 2022-01-26 DIAGNOSIS — N186 End stage renal disease: Secondary | ICD-10-CM | POA: Diagnosis not present

## 2022-01-26 DIAGNOSIS — N2581 Secondary hyperparathyroidism of renal origin: Secondary | ICD-10-CM | POA: Diagnosis not present

## 2022-01-26 DIAGNOSIS — Z992 Dependence on renal dialysis: Secondary | ICD-10-CM | POA: Diagnosis not present

## 2022-01-28 DIAGNOSIS — N186 End stage renal disease: Secondary | ICD-10-CM | POA: Diagnosis not present

## 2022-01-28 DIAGNOSIS — N2581 Secondary hyperparathyroidism of renal origin: Secondary | ICD-10-CM | POA: Diagnosis not present

## 2022-01-28 DIAGNOSIS — Z992 Dependence on renal dialysis: Secondary | ICD-10-CM | POA: Diagnosis not present

## 2022-01-30 DIAGNOSIS — N186 End stage renal disease: Secondary | ICD-10-CM | POA: Diagnosis not present

## 2022-01-30 DIAGNOSIS — N2581 Secondary hyperparathyroidism of renal origin: Secondary | ICD-10-CM | POA: Diagnosis not present

## 2022-01-30 DIAGNOSIS — Z992 Dependence on renal dialysis: Secondary | ICD-10-CM | POA: Diagnosis not present

## 2022-02-01 ENCOUNTER — Ambulatory Visit: Payer: Medicare PPO | Admitting: Emergency Medicine

## 2022-02-01 ENCOUNTER — Encounter: Payer: Self-pay | Admitting: Emergency Medicine

## 2022-02-02 DIAGNOSIS — Z992 Dependence on renal dialysis: Secondary | ICD-10-CM | POA: Diagnosis not present

## 2022-02-02 DIAGNOSIS — N186 End stage renal disease: Secondary | ICD-10-CM | POA: Diagnosis not present

## 2022-02-02 DIAGNOSIS — N2581 Secondary hyperparathyroidism of renal origin: Secondary | ICD-10-CM | POA: Diagnosis not present

## 2022-02-04 DIAGNOSIS — Z992 Dependence on renal dialysis: Secondary | ICD-10-CM | POA: Diagnosis not present

## 2022-02-04 DIAGNOSIS — N2581 Secondary hyperparathyroidism of renal origin: Secondary | ICD-10-CM | POA: Diagnosis not present

## 2022-02-04 DIAGNOSIS — N186 End stage renal disease: Secondary | ICD-10-CM | POA: Diagnosis not present

## 2022-02-06 DIAGNOSIS — Z992 Dependence on renal dialysis: Secondary | ICD-10-CM | POA: Diagnosis not present

## 2022-02-06 DIAGNOSIS — N186 End stage renal disease: Secondary | ICD-10-CM | POA: Diagnosis not present

## 2022-02-06 DIAGNOSIS — N2581 Secondary hyperparathyroidism of renal origin: Secondary | ICD-10-CM | POA: Diagnosis not present

## 2022-02-09 DIAGNOSIS — Z992 Dependence on renal dialysis: Secondary | ICD-10-CM | POA: Diagnosis not present

## 2022-02-09 DIAGNOSIS — N186 End stage renal disease: Secondary | ICD-10-CM | POA: Diagnosis not present

## 2022-02-09 DIAGNOSIS — N2581 Secondary hyperparathyroidism of renal origin: Secondary | ICD-10-CM | POA: Diagnosis not present

## 2022-02-10 DIAGNOSIS — N186 End stage renal disease: Secondary | ICD-10-CM | POA: Diagnosis not present

## 2022-02-10 DIAGNOSIS — Z992 Dependence on renal dialysis: Secondary | ICD-10-CM | POA: Diagnosis not present

## 2022-02-10 DIAGNOSIS — N2581 Secondary hyperparathyroidism of renal origin: Secondary | ICD-10-CM | POA: Diagnosis not present

## 2022-02-13 DIAGNOSIS — N2581 Secondary hyperparathyroidism of renal origin: Secondary | ICD-10-CM | POA: Diagnosis not present

## 2022-02-13 DIAGNOSIS — N186 End stage renal disease: Secondary | ICD-10-CM | POA: Diagnosis not present

## 2022-02-13 DIAGNOSIS — Z992 Dependence on renal dialysis: Secondary | ICD-10-CM | POA: Diagnosis not present

## 2022-02-16 DIAGNOSIS — N2581 Secondary hyperparathyroidism of renal origin: Secondary | ICD-10-CM | POA: Diagnosis not present

## 2022-02-16 DIAGNOSIS — Z992 Dependence on renal dialysis: Secondary | ICD-10-CM | POA: Diagnosis not present

## 2022-02-16 DIAGNOSIS — N186 End stage renal disease: Secondary | ICD-10-CM | POA: Diagnosis not present

## 2022-02-18 DIAGNOSIS — N186 End stage renal disease: Secondary | ICD-10-CM | POA: Diagnosis not present

## 2022-02-18 DIAGNOSIS — I129 Hypertensive chronic kidney disease with stage 1 through stage 4 chronic kidney disease, or unspecified chronic kidney disease: Secondary | ICD-10-CM | POA: Diagnosis not present

## 2022-02-18 DIAGNOSIS — N2581 Secondary hyperparathyroidism of renal origin: Secondary | ICD-10-CM | POA: Diagnosis not present

## 2022-02-18 DIAGNOSIS — Z992 Dependence on renal dialysis: Secondary | ICD-10-CM | POA: Diagnosis not present

## 2022-02-20 DIAGNOSIS — N2581 Secondary hyperparathyroidism of renal origin: Secondary | ICD-10-CM | POA: Diagnosis not present

## 2022-02-20 DIAGNOSIS — Z992 Dependence on renal dialysis: Secondary | ICD-10-CM | POA: Diagnosis not present

## 2022-02-20 DIAGNOSIS — N186 End stage renal disease: Secondary | ICD-10-CM | POA: Diagnosis not present

## 2022-02-23 DIAGNOSIS — N2581 Secondary hyperparathyroidism of renal origin: Secondary | ICD-10-CM | POA: Diagnosis not present

## 2022-02-23 DIAGNOSIS — Z992 Dependence on renal dialysis: Secondary | ICD-10-CM | POA: Diagnosis not present

## 2022-02-23 DIAGNOSIS — N186 End stage renal disease: Secondary | ICD-10-CM | POA: Diagnosis not present

## 2022-02-25 DIAGNOSIS — N2581 Secondary hyperparathyroidism of renal origin: Secondary | ICD-10-CM | POA: Diagnosis not present

## 2022-02-25 DIAGNOSIS — N186 End stage renal disease: Secondary | ICD-10-CM | POA: Diagnosis not present

## 2022-02-25 DIAGNOSIS — Z992 Dependence on renal dialysis: Secondary | ICD-10-CM | POA: Diagnosis not present

## 2022-02-26 DIAGNOSIS — H5203 Hypermetropia, bilateral: Secondary | ICD-10-CM | POA: Diagnosis not present

## 2022-02-26 DIAGNOSIS — H52203 Unspecified astigmatism, bilateral: Secondary | ICD-10-CM | POA: Diagnosis not present

## 2022-02-26 DIAGNOSIS — H2513 Age-related nuclear cataract, bilateral: Secondary | ICD-10-CM | POA: Diagnosis not present

## 2022-02-27 DIAGNOSIS — N2581 Secondary hyperparathyroidism of renal origin: Secondary | ICD-10-CM | POA: Diagnosis not present

## 2022-02-27 DIAGNOSIS — Z992 Dependence on renal dialysis: Secondary | ICD-10-CM | POA: Diagnosis not present

## 2022-02-27 DIAGNOSIS — N186 End stage renal disease: Secondary | ICD-10-CM | POA: Diagnosis not present

## 2022-03-02 DIAGNOSIS — Z992 Dependence on renal dialysis: Secondary | ICD-10-CM | POA: Diagnosis not present

## 2022-03-02 DIAGNOSIS — N186 End stage renal disease: Secondary | ICD-10-CM | POA: Diagnosis not present

## 2022-03-02 DIAGNOSIS — N2581 Secondary hyperparathyroidism of renal origin: Secondary | ICD-10-CM | POA: Diagnosis not present

## 2022-03-04 DIAGNOSIS — Z992 Dependence on renal dialysis: Secondary | ICD-10-CM | POA: Diagnosis not present

## 2022-03-04 DIAGNOSIS — N186 End stage renal disease: Secondary | ICD-10-CM | POA: Diagnosis not present

## 2022-03-04 DIAGNOSIS — N2581 Secondary hyperparathyroidism of renal origin: Secondary | ICD-10-CM | POA: Diagnosis not present

## 2022-03-06 DIAGNOSIS — N2581 Secondary hyperparathyroidism of renal origin: Secondary | ICD-10-CM | POA: Diagnosis not present

## 2022-03-06 DIAGNOSIS — Z992 Dependence on renal dialysis: Secondary | ICD-10-CM | POA: Diagnosis not present

## 2022-03-06 DIAGNOSIS — N186 End stage renal disease: Secondary | ICD-10-CM | POA: Diagnosis not present

## 2022-03-09 ENCOUNTER — Other Ambulatory Visit: Payer: Self-pay | Admitting: Emergency Medicine

## 2022-03-09 DIAGNOSIS — Z992 Dependence on renal dialysis: Secondary | ICD-10-CM | POA: Diagnosis not present

## 2022-03-09 DIAGNOSIS — N186 End stage renal disease: Secondary | ICD-10-CM | POA: Diagnosis not present

## 2022-03-09 DIAGNOSIS — N2581 Secondary hyperparathyroidism of renal origin: Secondary | ICD-10-CM | POA: Diagnosis not present

## 2022-03-11 DIAGNOSIS — N2581 Secondary hyperparathyroidism of renal origin: Secondary | ICD-10-CM | POA: Diagnosis not present

## 2022-03-11 DIAGNOSIS — Z992 Dependence on renal dialysis: Secondary | ICD-10-CM | POA: Diagnosis not present

## 2022-03-11 DIAGNOSIS — N186 End stage renal disease: Secondary | ICD-10-CM | POA: Diagnosis not present

## 2022-03-13 DIAGNOSIS — N2581 Secondary hyperparathyroidism of renal origin: Secondary | ICD-10-CM | POA: Diagnosis not present

## 2022-03-13 DIAGNOSIS — N186 End stage renal disease: Secondary | ICD-10-CM | POA: Diagnosis not present

## 2022-03-13 DIAGNOSIS — Z992 Dependence on renal dialysis: Secondary | ICD-10-CM | POA: Diagnosis not present

## 2022-03-16 DIAGNOSIS — N186 End stage renal disease: Secondary | ICD-10-CM | POA: Diagnosis not present

## 2022-03-16 DIAGNOSIS — Z992 Dependence on renal dialysis: Secondary | ICD-10-CM | POA: Diagnosis not present

## 2022-03-16 DIAGNOSIS — N2581 Secondary hyperparathyroidism of renal origin: Secondary | ICD-10-CM | POA: Diagnosis not present

## 2022-03-18 DIAGNOSIS — Z992 Dependence on renal dialysis: Secondary | ICD-10-CM | POA: Diagnosis not present

## 2022-03-18 DIAGNOSIS — N2581 Secondary hyperparathyroidism of renal origin: Secondary | ICD-10-CM | POA: Diagnosis not present

## 2022-03-18 DIAGNOSIS — N186 End stage renal disease: Secondary | ICD-10-CM | POA: Diagnosis not present

## 2022-03-20 DIAGNOSIS — Z992 Dependence on renal dialysis: Secondary | ICD-10-CM | POA: Diagnosis not present

## 2022-03-20 DIAGNOSIS — N2581 Secondary hyperparathyroidism of renal origin: Secondary | ICD-10-CM | POA: Diagnosis not present

## 2022-03-20 DIAGNOSIS — N186 End stage renal disease: Secondary | ICD-10-CM | POA: Diagnosis not present

## 2022-03-21 DIAGNOSIS — I129 Hypertensive chronic kidney disease with stage 1 through stage 4 chronic kidney disease, or unspecified chronic kidney disease: Secondary | ICD-10-CM | POA: Diagnosis not present

## 2022-03-21 DIAGNOSIS — N186 End stage renal disease: Secondary | ICD-10-CM | POA: Diagnosis not present

## 2022-03-21 DIAGNOSIS — Z992 Dependence on renal dialysis: Secondary | ICD-10-CM | POA: Diagnosis not present

## 2022-03-23 DIAGNOSIS — N2581 Secondary hyperparathyroidism of renal origin: Secondary | ICD-10-CM | POA: Diagnosis not present

## 2022-03-23 DIAGNOSIS — N186 End stage renal disease: Secondary | ICD-10-CM | POA: Diagnosis not present

## 2022-03-23 DIAGNOSIS — Z992 Dependence on renal dialysis: Secondary | ICD-10-CM | POA: Diagnosis not present

## 2022-03-25 DIAGNOSIS — Z992 Dependence on renal dialysis: Secondary | ICD-10-CM | POA: Diagnosis not present

## 2022-03-25 DIAGNOSIS — N2581 Secondary hyperparathyroidism of renal origin: Secondary | ICD-10-CM | POA: Diagnosis not present

## 2022-03-25 DIAGNOSIS — N186 End stage renal disease: Secondary | ICD-10-CM | POA: Diagnosis not present

## 2022-03-27 DIAGNOSIS — N186 End stage renal disease: Secondary | ICD-10-CM | POA: Diagnosis not present

## 2022-03-27 DIAGNOSIS — N2581 Secondary hyperparathyroidism of renal origin: Secondary | ICD-10-CM | POA: Diagnosis not present

## 2022-03-27 DIAGNOSIS — Z992 Dependence on renal dialysis: Secondary | ICD-10-CM | POA: Diagnosis not present

## 2022-03-30 DIAGNOSIS — N2581 Secondary hyperparathyroidism of renal origin: Secondary | ICD-10-CM | POA: Diagnosis not present

## 2022-03-30 DIAGNOSIS — Z992 Dependence on renal dialysis: Secondary | ICD-10-CM | POA: Diagnosis not present

## 2022-03-30 DIAGNOSIS — N186 End stage renal disease: Secondary | ICD-10-CM | POA: Diagnosis not present

## 2022-04-01 DIAGNOSIS — N2581 Secondary hyperparathyroidism of renal origin: Secondary | ICD-10-CM | POA: Diagnosis not present

## 2022-04-01 DIAGNOSIS — Z992 Dependence on renal dialysis: Secondary | ICD-10-CM | POA: Diagnosis not present

## 2022-04-01 DIAGNOSIS — N186 End stage renal disease: Secondary | ICD-10-CM | POA: Diagnosis not present

## 2022-04-03 DIAGNOSIS — N186 End stage renal disease: Secondary | ICD-10-CM | POA: Diagnosis not present

## 2022-04-03 DIAGNOSIS — Z992 Dependence on renal dialysis: Secondary | ICD-10-CM | POA: Diagnosis not present

## 2022-04-03 DIAGNOSIS — N2581 Secondary hyperparathyroidism of renal origin: Secondary | ICD-10-CM | POA: Diagnosis not present

## 2022-04-08 DIAGNOSIS — Z992 Dependence on renal dialysis: Secondary | ICD-10-CM | POA: Diagnosis not present

## 2022-04-08 DIAGNOSIS — N2581 Secondary hyperparathyroidism of renal origin: Secondary | ICD-10-CM | POA: Diagnosis not present

## 2022-04-08 DIAGNOSIS — N186 End stage renal disease: Secondary | ICD-10-CM | POA: Diagnosis not present

## 2022-04-10 DIAGNOSIS — N186 End stage renal disease: Secondary | ICD-10-CM | POA: Diagnosis not present

## 2022-04-10 DIAGNOSIS — N2581 Secondary hyperparathyroidism of renal origin: Secondary | ICD-10-CM | POA: Diagnosis not present

## 2022-04-10 DIAGNOSIS — Z992 Dependence on renal dialysis: Secondary | ICD-10-CM | POA: Diagnosis not present

## 2022-04-13 DIAGNOSIS — Z992 Dependence on renal dialysis: Secondary | ICD-10-CM | POA: Diagnosis not present

## 2022-04-13 DIAGNOSIS — N2581 Secondary hyperparathyroidism of renal origin: Secondary | ICD-10-CM | POA: Diagnosis not present

## 2022-04-13 DIAGNOSIS — N186 End stage renal disease: Secondary | ICD-10-CM | POA: Diagnosis not present

## 2022-04-15 DIAGNOSIS — Z992 Dependence on renal dialysis: Secondary | ICD-10-CM | POA: Diagnosis not present

## 2022-04-15 DIAGNOSIS — N186 End stage renal disease: Secondary | ICD-10-CM | POA: Diagnosis not present

## 2022-04-15 DIAGNOSIS — N2581 Secondary hyperparathyroidism of renal origin: Secondary | ICD-10-CM | POA: Diagnosis not present

## 2022-04-17 DIAGNOSIS — N2581 Secondary hyperparathyroidism of renal origin: Secondary | ICD-10-CM | POA: Diagnosis not present

## 2022-04-17 DIAGNOSIS — N186 End stage renal disease: Secondary | ICD-10-CM | POA: Diagnosis not present

## 2022-04-17 DIAGNOSIS — Z992 Dependence on renal dialysis: Secondary | ICD-10-CM | POA: Diagnosis not present

## 2022-04-20 DIAGNOSIS — N2581 Secondary hyperparathyroidism of renal origin: Secondary | ICD-10-CM | POA: Diagnosis not present

## 2022-04-20 DIAGNOSIS — N186 End stage renal disease: Secondary | ICD-10-CM | POA: Diagnosis not present

## 2022-04-20 DIAGNOSIS — Z992 Dependence on renal dialysis: Secondary | ICD-10-CM | POA: Diagnosis not present

## 2022-04-21 DIAGNOSIS — I129 Hypertensive chronic kidney disease with stage 1 through stage 4 chronic kidney disease, or unspecified chronic kidney disease: Secondary | ICD-10-CM | POA: Diagnosis not present

## 2022-04-21 DIAGNOSIS — N186 End stage renal disease: Secondary | ICD-10-CM | POA: Diagnosis not present

## 2022-04-21 DIAGNOSIS — Z992 Dependence on renal dialysis: Secondary | ICD-10-CM | POA: Diagnosis not present

## 2022-04-22 DIAGNOSIS — Z992 Dependence on renal dialysis: Secondary | ICD-10-CM | POA: Diagnosis not present

## 2022-04-22 DIAGNOSIS — N2581 Secondary hyperparathyroidism of renal origin: Secondary | ICD-10-CM | POA: Diagnosis not present

## 2022-04-22 DIAGNOSIS — N186 End stage renal disease: Secondary | ICD-10-CM | POA: Diagnosis not present

## 2022-04-24 DIAGNOSIS — N2581 Secondary hyperparathyroidism of renal origin: Secondary | ICD-10-CM | POA: Diagnosis not present

## 2022-04-24 DIAGNOSIS — Z992 Dependence on renal dialysis: Secondary | ICD-10-CM | POA: Diagnosis not present

## 2022-04-24 DIAGNOSIS — N186 End stage renal disease: Secondary | ICD-10-CM | POA: Diagnosis not present

## 2022-04-27 DIAGNOSIS — N2581 Secondary hyperparathyroidism of renal origin: Secondary | ICD-10-CM | POA: Diagnosis not present

## 2022-04-27 DIAGNOSIS — N186 End stage renal disease: Secondary | ICD-10-CM | POA: Diagnosis not present

## 2022-04-27 DIAGNOSIS — Z992 Dependence on renal dialysis: Secondary | ICD-10-CM | POA: Diagnosis not present

## 2022-04-29 DIAGNOSIS — Z992 Dependence on renal dialysis: Secondary | ICD-10-CM | POA: Diagnosis not present

## 2022-04-29 DIAGNOSIS — N186 End stage renal disease: Secondary | ICD-10-CM | POA: Diagnosis not present

## 2022-04-29 DIAGNOSIS — N2581 Secondary hyperparathyroidism of renal origin: Secondary | ICD-10-CM | POA: Diagnosis not present

## 2022-05-01 DIAGNOSIS — N2581 Secondary hyperparathyroidism of renal origin: Secondary | ICD-10-CM | POA: Diagnosis not present

## 2022-05-01 DIAGNOSIS — Z992 Dependence on renal dialysis: Secondary | ICD-10-CM | POA: Diagnosis not present

## 2022-05-01 DIAGNOSIS — N186 End stage renal disease: Secondary | ICD-10-CM | POA: Diagnosis not present

## 2022-05-04 DIAGNOSIS — N186 End stage renal disease: Secondary | ICD-10-CM | POA: Diagnosis not present

## 2022-05-04 DIAGNOSIS — N2581 Secondary hyperparathyroidism of renal origin: Secondary | ICD-10-CM | POA: Diagnosis not present

## 2022-05-04 DIAGNOSIS — Z992 Dependence on renal dialysis: Secondary | ICD-10-CM | POA: Diagnosis not present

## 2022-05-06 DIAGNOSIS — Z992 Dependence on renal dialysis: Secondary | ICD-10-CM | POA: Diagnosis not present

## 2022-05-06 DIAGNOSIS — N186 End stage renal disease: Secondary | ICD-10-CM | POA: Diagnosis not present

## 2022-05-06 DIAGNOSIS — N2581 Secondary hyperparathyroidism of renal origin: Secondary | ICD-10-CM | POA: Diagnosis not present

## 2022-05-08 DIAGNOSIS — N2581 Secondary hyperparathyroidism of renal origin: Secondary | ICD-10-CM | POA: Diagnosis not present

## 2022-05-08 DIAGNOSIS — Z992 Dependence on renal dialysis: Secondary | ICD-10-CM | POA: Diagnosis not present

## 2022-05-08 DIAGNOSIS — N186 End stage renal disease: Secondary | ICD-10-CM | POA: Diagnosis not present

## 2022-05-11 DIAGNOSIS — N2581 Secondary hyperparathyroidism of renal origin: Secondary | ICD-10-CM | POA: Diagnosis not present

## 2022-05-11 DIAGNOSIS — Z992 Dependence on renal dialysis: Secondary | ICD-10-CM | POA: Diagnosis not present

## 2022-05-11 DIAGNOSIS — N186 End stage renal disease: Secondary | ICD-10-CM | POA: Diagnosis not present

## 2022-05-13 DIAGNOSIS — Z992 Dependence on renal dialysis: Secondary | ICD-10-CM | POA: Diagnosis not present

## 2022-05-13 DIAGNOSIS — N186 End stage renal disease: Secondary | ICD-10-CM | POA: Diagnosis not present

## 2022-05-13 DIAGNOSIS — N2581 Secondary hyperparathyroidism of renal origin: Secondary | ICD-10-CM | POA: Diagnosis not present

## 2022-05-15 DIAGNOSIS — N186 End stage renal disease: Secondary | ICD-10-CM | POA: Diagnosis not present

## 2022-05-15 DIAGNOSIS — N2581 Secondary hyperparathyroidism of renal origin: Secondary | ICD-10-CM | POA: Diagnosis not present

## 2022-05-15 DIAGNOSIS — Z992 Dependence on renal dialysis: Secondary | ICD-10-CM | POA: Diagnosis not present

## 2022-05-18 ENCOUNTER — Other Ambulatory Visit: Payer: Self-pay | Admitting: Emergency Medicine

## 2022-05-18 DIAGNOSIS — N2581 Secondary hyperparathyroidism of renal origin: Secondary | ICD-10-CM | POA: Diagnosis not present

## 2022-05-18 DIAGNOSIS — N186 End stage renal disease: Secondary | ICD-10-CM | POA: Diagnosis not present

## 2022-05-18 DIAGNOSIS — Z992 Dependence on renal dialysis: Secondary | ICD-10-CM | POA: Diagnosis not present

## 2022-05-20 DIAGNOSIS — N2581 Secondary hyperparathyroidism of renal origin: Secondary | ICD-10-CM | POA: Diagnosis not present

## 2022-05-20 DIAGNOSIS — N186 End stage renal disease: Secondary | ICD-10-CM | POA: Diagnosis not present

## 2022-05-20 DIAGNOSIS — I129 Hypertensive chronic kidney disease with stage 1 through stage 4 chronic kidney disease, or unspecified chronic kidney disease: Secondary | ICD-10-CM | POA: Diagnosis not present

## 2022-05-20 DIAGNOSIS — Z992 Dependence on renal dialysis: Secondary | ICD-10-CM | POA: Diagnosis not present

## 2022-05-22 DIAGNOSIS — Z992 Dependence on renal dialysis: Secondary | ICD-10-CM | POA: Diagnosis not present

## 2022-05-22 DIAGNOSIS — N186 End stage renal disease: Secondary | ICD-10-CM | POA: Diagnosis not present

## 2022-05-22 DIAGNOSIS — N2581 Secondary hyperparathyroidism of renal origin: Secondary | ICD-10-CM | POA: Diagnosis not present

## 2022-05-25 DIAGNOSIS — N2581 Secondary hyperparathyroidism of renal origin: Secondary | ICD-10-CM | POA: Diagnosis not present

## 2022-05-25 DIAGNOSIS — Z992 Dependence on renal dialysis: Secondary | ICD-10-CM | POA: Diagnosis not present

## 2022-05-25 DIAGNOSIS — N186 End stage renal disease: Secondary | ICD-10-CM | POA: Diagnosis not present

## 2022-05-27 DIAGNOSIS — N186 End stage renal disease: Secondary | ICD-10-CM | POA: Diagnosis not present

## 2022-05-27 DIAGNOSIS — N2581 Secondary hyperparathyroidism of renal origin: Secondary | ICD-10-CM | POA: Diagnosis not present

## 2022-05-27 DIAGNOSIS — Z992 Dependence on renal dialysis: Secondary | ICD-10-CM | POA: Diagnosis not present

## 2022-05-29 DIAGNOSIS — N186 End stage renal disease: Secondary | ICD-10-CM | POA: Diagnosis not present

## 2022-05-29 DIAGNOSIS — Z992 Dependence on renal dialysis: Secondary | ICD-10-CM | POA: Diagnosis not present

## 2022-05-29 DIAGNOSIS — N2581 Secondary hyperparathyroidism of renal origin: Secondary | ICD-10-CM | POA: Diagnosis not present

## 2022-06-01 DIAGNOSIS — Z992 Dependence on renal dialysis: Secondary | ICD-10-CM | POA: Diagnosis not present

## 2022-06-01 DIAGNOSIS — N186 End stage renal disease: Secondary | ICD-10-CM | POA: Diagnosis not present

## 2022-06-01 DIAGNOSIS — N2581 Secondary hyperparathyroidism of renal origin: Secondary | ICD-10-CM | POA: Diagnosis not present

## 2022-06-03 DIAGNOSIS — Z992 Dependence on renal dialysis: Secondary | ICD-10-CM | POA: Diagnosis not present

## 2022-06-03 DIAGNOSIS — N186 End stage renal disease: Secondary | ICD-10-CM | POA: Diagnosis not present

## 2022-06-03 DIAGNOSIS — N2581 Secondary hyperparathyroidism of renal origin: Secondary | ICD-10-CM | POA: Diagnosis not present

## 2022-06-05 DIAGNOSIS — Z992 Dependence on renal dialysis: Secondary | ICD-10-CM | POA: Diagnosis not present

## 2022-06-05 DIAGNOSIS — N2581 Secondary hyperparathyroidism of renal origin: Secondary | ICD-10-CM | POA: Diagnosis not present

## 2022-06-05 DIAGNOSIS — N186 End stage renal disease: Secondary | ICD-10-CM | POA: Diagnosis not present

## 2022-06-08 DIAGNOSIS — N2581 Secondary hyperparathyroidism of renal origin: Secondary | ICD-10-CM | POA: Diagnosis not present

## 2022-06-08 DIAGNOSIS — Z992 Dependence on renal dialysis: Secondary | ICD-10-CM | POA: Diagnosis not present

## 2022-06-08 DIAGNOSIS — N186 End stage renal disease: Secondary | ICD-10-CM | POA: Diagnosis not present

## 2022-06-10 DIAGNOSIS — N2581 Secondary hyperparathyroidism of renal origin: Secondary | ICD-10-CM | POA: Diagnosis not present

## 2022-06-10 DIAGNOSIS — Z992 Dependence on renal dialysis: Secondary | ICD-10-CM | POA: Diagnosis not present

## 2022-06-10 DIAGNOSIS — N186 End stage renal disease: Secondary | ICD-10-CM | POA: Diagnosis not present

## 2022-06-12 DIAGNOSIS — N186 End stage renal disease: Secondary | ICD-10-CM | POA: Diagnosis not present

## 2022-06-12 DIAGNOSIS — Z992 Dependence on renal dialysis: Secondary | ICD-10-CM | POA: Diagnosis not present

## 2022-06-12 DIAGNOSIS — N2581 Secondary hyperparathyroidism of renal origin: Secondary | ICD-10-CM | POA: Diagnosis not present

## 2022-06-15 DIAGNOSIS — Z992 Dependence on renal dialysis: Secondary | ICD-10-CM | POA: Diagnosis not present

## 2022-06-15 DIAGNOSIS — N186 End stage renal disease: Secondary | ICD-10-CM | POA: Diagnosis not present

## 2022-06-15 DIAGNOSIS — N2581 Secondary hyperparathyroidism of renal origin: Secondary | ICD-10-CM | POA: Diagnosis not present

## 2022-06-17 DIAGNOSIS — Z992 Dependence on renal dialysis: Secondary | ICD-10-CM | POA: Diagnosis not present

## 2022-06-17 DIAGNOSIS — N186 End stage renal disease: Secondary | ICD-10-CM | POA: Diagnosis not present

## 2022-06-17 DIAGNOSIS — N2581 Secondary hyperparathyroidism of renal origin: Secondary | ICD-10-CM | POA: Diagnosis not present

## 2022-06-20 DIAGNOSIS — I129 Hypertensive chronic kidney disease with stage 1 through stage 4 chronic kidney disease, or unspecified chronic kidney disease: Secondary | ICD-10-CM | POA: Diagnosis not present

## 2022-06-20 DIAGNOSIS — N186 End stage renal disease: Secondary | ICD-10-CM | POA: Diagnosis not present

## 2022-06-20 DIAGNOSIS — Z992 Dependence on renal dialysis: Secondary | ICD-10-CM | POA: Diagnosis not present

## 2022-06-22 DIAGNOSIS — N2581 Secondary hyperparathyroidism of renal origin: Secondary | ICD-10-CM | POA: Diagnosis not present

## 2022-06-22 DIAGNOSIS — N186 End stage renal disease: Secondary | ICD-10-CM | POA: Diagnosis not present

## 2022-06-22 DIAGNOSIS — Z992 Dependence on renal dialysis: Secondary | ICD-10-CM | POA: Diagnosis not present

## 2022-06-24 DIAGNOSIS — N2581 Secondary hyperparathyroidism of renal origin: Secondary | ICD-10-CM | POA: Diagnosis not present

## 2022-06-24 DIAGNOSIS — Z992 Dependence on renal dialysis: Secondary | ICD-10-CM | POA: Diagnosis not present

## 2022-06-24 DIAGNOSIS — N186 End stage renal disease: Secondary | ICD-10-CM | POA: Diagnosis not present

## 2022-06-26 DIAGNOSIS — N186 End stage renal disease: Secondary | ICD-10-CM | POA: Diagnosis not present

## 2022-06-26 DIAGNOSIS — N2581 Secondary hyperparathyroidism of renal origin: Secondary | ICD-10-CM | POA: Diagnosis not present

## 2022-06-26 DIAGNOSIS — Z992 Dependence on renal dialysis: Secondary | ICD-10-CM | POA: Diagnosis not present

## 2022-06-29 ENCOUNTER — Other Ambulatory Visit: Payer: Self-pay | Admitting: Obstetrics and Gynecology

## 2022-06-29 DIAGNOSIS — N186 End stage renal disease: Secondary | ICD-10-CM | POA: Diagnosis not present

## 2022-06-29 DIAGNOSIS — N2581 Secondary hyperparathyroidism of renal origin: Secondary | ICD-10-CM | POA: Diagnosis not present

## 2022-06-29 DIAGNOSIS — Z992 Dependence on renal dialysis: Secondary | ICD-10-CM | POA: Diagnosis not present

## 2022-07-01 DIAGNOSIS — N186 End stage renal disease: Secondary | ICD-10-CM | POA: Diagnosis not present

## 2022-07-01 DIAGNOSIS — Z992 Dependence on renal dialysis: Secondary | ICD-10-CM | POA: Diagnosis not present

## 2022-07-01 DIAGNOSIS — N2581 Secondary hyperparathyroidism of renal origin: Secondary | ICD-10-CM | POA: Diagnosis not present

## 2022-07-03 DIAGNOSIS — Z992 Dependence on renal dialysis: Secondary | ICD-10-CM | POA: Diagnosis not present

## 2022-07-03 DIAGNOSIS — N2581 Secondary hyperparathyroidism of renal origin: Secondary | ICD-10-CM | POA: Diagnosis not present

## 2022-07-03 DIAGNOSIS — N186 End stage renal disease: Secondary | ICD-10-CM | POA: Diagnosis not present

## 2022-07-06 DIAGNOSIS — Z992 Dependence on renal dialysis: Secondary | ICD-10-CM | POA: Diagnosis not present

## 2022-07-06 DIAGNOSIS — N186 End stage renal disease: Secondary | ICD-10-CM | POA: Diagnosis not present

## 2022-07-06 DIAGNOSIS — N2581 Secondary hyperparathyroidism of renal origin: Secondary | ICD-10-CM | POA: Diagnosis not present

## 2022-07-08 DIAGNOSIS — N2581 Secondary hyperparathyroidism of renal origin: Secondary | ICD-10-CM | POA: Diagnosis not present

## 2022-07-08 DIAGNOSIS — Z992 Dependence on renal dialysis: Secondary | ICD-10-CM | POA: Diagnosis not present

## 2022-07-08 DIAGNOSIS — N186 End stage renal disease: Secondary | ICD-10-CM | POA: Diagnosis not present

## 2022-07-10 DIAGNOSIS — N2581 Secondary hyperparathyroidism of renal origin: Secondary | ICD-10-CM | POA: Diagnosis not present

## 2022-07-10 DIAGNOSIS — N186 End stage renal disease: Secondary | ICD-10-CM | POA: Diagnosis not present

## 2022-07-10 DIAGNOSIS — Z992 Dependence on renal dialysis: Secondary | ICD-10-CM | POA: Diagnosis not present

## 2022-07-13 DIAGNOSIS — N2581 Secondary hyperparathyroidism of renal origin: Secondary | ICD-10-CM | POA: Diagnosis not present

## 2022-07-13 DIAGNOSIS — N186 End stage renal disease: Secondary | ICD-10-CM | POA: Diagnosis not present

## 2022-07-13 DIAGNOSIS — Z992 Dependence on renal dialysis: Secondary | ICD-10-CM | POA: Diagnosis not present

## 2022-07-15 DIAGNOSIS — N2581 Secondary hyperparathyroidism of renal origin: Secondary | ICD-10-CM | POA: Diagnosis not present

## 2022-07-15 DIAGNOSIS — N186 End stage renal disease: Secondary | ICD-10-CM | POA: Diagnosis not present

## 2022-07-15 DIAGNOSIS — Z992 Dependence on renal dialysis: Secondary | ICD-10-CM | POA: Diagnosis not present

## 2022-07-17 DIAGNOSIS — N2581 Secondary hyperparathyroidism of renal origin: Secondary | ICD-10-CM | POA: Diagnosis not present

## 2022-07-17 DIAGNOSIS — N186 End stage renal disease: Secondary | ICD-10-CM | POA: Diagnosis not present

## 2022-07-17 DIAGNOSIS — Z992 Dependence on renal dialysis: Secondary | ICD-10-CM | POA: Diagnosis not present

## 2022-07-18 ENCOUNTER — Other Ambulatory Visit: Payer: Self-pay | Admitting: Emergency Medicine

## 2022-07-18 DIAGNOSIS — R12 Heartburn: Secondary | ICD-10-CM

## 2022-07-20 DIAGNOSIS — I129 Hypertensive chronic kidney disease with stage 1 through stage 4 chronic kidney disease, or unspecified chronic kidney disease: Secondary | ICD-10-CM | POA: Diagnosis not present

## 2022-07-20 DIAGNOSIS — N186 End stage renal disease: Secondary | ICD-10-CM | POA: Diagnosis not present

## 2022-07-20 DIAGNOSIS — N2581 Secondary hyperparathyroidism of renal origin: Secondary | ICD-10-CM | POA: Diagnosis not present

## 2022-07-20 DIAGNOSIS — Z992 Dependence on renal dialysis: Secondary | ICD-10-CM | POA: Diagnosis not present

## 2022-07-22 DIAGNOSIS — Z992 Dependence on renal dialysis: Secondary | ICD-10-CM | POA: Diagnosis not present

## 2022-07-22 DIAGNOSIS — N186 End stage renal disease: Secondary | ICD-10-CM | POA: Diagnosis not present

## 2022-07-22 DIAGNOSIS — N2581 Secondary hyperparathyroidism of renal origin: Secondary | ICD-10-CM | POA: Diagnosis not present

## 2022-07-24 DIAGNOSIS — Z992 Dependence on renal dialysis: Secondary | ICD-10-CM | POA: Diagnosis not present

## 2022-07-24 DIAGNOSIS — N186 End stage renal disease: Secondary | ICD-10-CM | POA: Diagnosis not present

## 2022-07-24 DIAGNOSIS — N2581 Secondary hyperparathyroidism of renal origin: Secondary | ICD-10-CM | POA: Diagnosis not present

## 2022-07-27 DIAGNOSIS — N2581 Secondary hyperparathyroidism of renal origin: Secondary | ICD-10-CM | POA: Diagnosis not present

## 2022-07-27 DIAGNOSIS — N186 End stage renal disease: Secondary | ICD-10-CM | POA: Diagnosis not present

## 2022-07-27 DIAGNOSIS — Z992 Dependence on renal dialysis: Secondary | ICD-10-CM | POA: Diagnosis not present

## 2022-07-29 DIAGNOSIS — N2581 Secondary hyperparathyroidism of renal origin: Secondary | ICD-10-CM | POA: Diagnosis not present

## 2022-07-29 DIAGNOSIS — Z992 Dependence on renal dialysis: Secondary | ICD-10-CM | POA: Diagnosis not present

## 2022-07-29 DIAGNOSIS — N186 End stage renal disease: Secondary | ICD-10-CM | POA: Diagnosis not present

## 2022-07-31 DIAGNOSIS — N2581 Secondary hyperparathyroidism of renal origin: Secondary | ICD-10-CM | POA: Diagnosis not present

## 2022-07-31 DIAGNOSIS — Z992 Dependence on renal dialysis: Secondary | ICD-10-CM | POA: Diagnosis not present

## 2022-07-31 DIAGNOSIS — N186 End stage renal disease: Secondary | ICD-10-CM | POA: Diagnosis not present

## 2022-08-02 ENCOUNTER — Other Ambulatory Visit: Payer: Self-pay | Admitting: Emergency Medicine

## 2022-08-03 DIAGNOSIS — Z992 Dependence on renal dialysis: Secondary | ICD-10-CM | POA: Diagnosis not present

## 2022-08-03 DIAGNOSIS — N2581 Secondary hyperparathyroidism of renal origin: Secondary | ICD-10-CM | POA: Diagnosis not present

## 2022-08-03 DIAGNOSIS — N186 End stage renal disease: Secondary | ICD-10-CM | POA: Diagnosis not present

## 2022-08-05 DIAGNOSIS — N2581 Secondary hyperparathyroidism of renal origin: Secondary | ICD-10-CM | POA: Diagnosis not present

## 2022-08-05 DIAGNOSIS — Z992 Dependence on renal dialysis: Secondary | ICD-10-CM | POA: Diagnosis not present

## 2022-08-05 DIAGNOSIS — N186 End stage renal disease: Secondary | ICD-10-CM | POA: Diagnosis not present

## 2022-08-07 DIAGNOSIS — Z992 Dependence on renal dialysis: Secondary | ICD-10-CM | POA: Diagnosis not present

## 2022-08-07 DIAGNOSIS — N186 End stage renal disease: Secondary | ICD-10-CM | POA: Diagnosis not present

## 2022-08-07 DIAGNOSIS — N2581 Secondary hyperparathyroidism of renal origin: Secondary | ICD-10-CM | POA: Diagnosis not present

## 2022-08-10 DIAGNOSIS — Z992 Dependence on renal dialysis: Secondary | ICD-10-CM | POA: Diagnosis not present

## 2022-08-10 DIAGNOSIS — N186 End stage renal disease: Secondary | ICD-10-CM | POA: Diagnosis not present

## 2022-08-10 DIAGNOSIS — N2581 Secondary hyperparathyroidism of renal origin: Secondary | ICD-10-CM | POA: Diagnosis not present

## 2022-08-12 DIAGNOSIS — N186 End stage renal disease: Secondary | ICD-10-CM | POA: Diagnosis not present

## 2022-08-12 DIAGNOSIS — N2581 Secondary hyperparathyroidism of renal origin: Secondary | ICD-10-CM | POA: Diagnosis not present

## 2022-08-12 DIAGNOSIS — Z992 Dependence on renal dialysis: Secondary | ICD-10-CM | POA: Diagnosis not present

## 2022-08-14 DIAGNOSIS — N2581 Secondary hyperparathyroidism of renal origin: Secondary | ICD-10-CM | POA: Diagnosis not present

## 2022-08-14 DIAGNOSIS — Z992 Dependence on renal dialysis: Secondary | ICD-10-CM | POA: Diagnosis not present

## 2022-08-14 DIAGNOSIS — N186 End stage renal disease: Secondary | ICD-10-CM | POA: Diagnosis not present

## 2022-08-17 DIAGNOSIS — Z992 Dependence on renal dialysis: Secondary | ICD-10-CM | POA: Diagnosis not present

## 2022-08-17 DIAGNOSIS — N2581 Secondary hyperparathyroidism of renal origin: Secondary | ICD-10-CM | POA: Diagnosis not present

## 2022-08-17 DIAGNOSIS — N186 End stage renal disease: Secondary | ICD-10-CM | POA: Diagnosis not present

## 2022-08-19 DIAGNOSIS — N186 End stage renal disease: Secondary | ICD-10-CM | POA: Diagnosis not present

## 2022-08-19 DIAGNOSIS — Z992 Dependence on renal dialysis: Secondary | ICD-10-CM | POA: Diagnosis not present

## 2022-08-19 DIAGNOSIS — N2581 Secondary hyperparathyroidism of renal origin: Secondary | ICD-10-CM | POA: Diagnosis not present

## 2022-08-20 DIAGNOSIS — N186 End stage renal disease: Secondary | ICD-10-CM | POA: Diagnosis not present

## 2022-08-20 DIAGNOSIS — I129 Hypertensive chronic kidney disease with stage 1 through stage 4 chronic kidney disease, or unspecified chronic kidney disease: Secondary | ICD-10-CM | POA: Diagnosis not present

## 2022-08-20 DIAGNOSIS — Z992 Dependence on renal dialysis: Secondary | ICD-10-CM | POA: Diagnosis not present

## 2022-08-21 DIAGNOSIS — Z992 Dependence on renal dialysis: Secondary | ICD-10-CM | POA: Diagnosis not present

## 2022-08-21 DIAGNOSIS — N2581 Secondary hyperparathyroidism of renal origin: Secondary | ICD-10-CM | POA: Diagnosis not present

## 2022-08-21 DIAGNOSIS — N186 End stage renal disease: Secondary | ICD-10-CM | POA: Diagnosis not present

## 2022-08-24 DIAGNOSIS — N186 End stage renal disease: Secondary | ICD-10-CM | POA: Diagnosis not present

## 2022-08-24 DIAGNOSIS — N2581 Secondary hyperparathyroidism of renal origin: Secondary | ICD-10-CM | POA: Diagnosis not present

## 2022-08-24 DIAGNOSIS — Z992 Dependence on renal dialysis: Secondary | ICD-10-CM | POA: Diagnosis not present

## 2022-08-26 DIAGNOSIS — Z992 Dependence on renal dialysis: Secondary | ICD-10-CM | POA: Diagnosis not present

## 2022-08-26 DIAGNOSIS — N2581 Secondary hyperparathyroidism of renal origin: Secondary | ICD-10-CM | POA: Diagnosis not present

## 2022-08-26 DIAGNOSIS — N186 End stage renal disease: Secondary | ICD-10-CM | POA: Diagnosis not present

## 2022-08-28 DIAGNOSIS — Z992 Dependence on renal dialysis: Secondary | ICD-10-CM | POA: Diagnosis not present

## 2022-08-28 DIAGNOSIS — N186 End stage renal disease: Secondary | ICD-10-CM | POA: Diagnosis not present

## 2022-08-28 DIAGNOSIS — N2581 Secondary hyperparathyroidism of renal origin: Secondary | ICD-10-CM | POA: Diagnosis not present

## 2022-08-31 DIAGNOSIS — Z992 Dependence on renal dialysis: Secondary | ICD-10-CM | POA: Diagnosis not present

## 2022-08-31 DIAGNOSIS — N186 End stage renal disease: Secondary | ICD-10-CM | POA: Diagnosis not present

## 2022-08-31 DIAGNOSIS — N2581 Secondary hyperparathyroidism of renal origin: Secondary | ICD-10-CM | POA: Diagnosis not present

## 2022-09-02 DIAGNOSIS — Z992 Dependence on renal dialysis: Secondary | ICD-10-CM | POA: Diagnosis not present

## 2022-09-02 DIAGNOSIS — N186 End stage renal disease: Secondary | ICD-10-CM | POA: Diagnosis not present

## 2022-09-02 DIAGNOSIS — N2581 Secondary hyperparathyroidism of renal origin: Secondary | ICD-10-CM | POA: Diagnosis not present

## 2022-09-04 DIAGNOSIS — Z992 Dependence on renal dialysis: Secondary | ICD-10-CM | POA: Diagnosis not present

## 2022-09-04 DIAGNOSIS — N186 End stage renal disease: Secondary | ICD-10-CM | POA: Diagnosis not present

## 2022-09-04 DIAGNOSIS — N2581 Secondary hyperparathyroidism of renal origin: Secondary | ICD-10-CM | POA: Diagnosis not present

## 2022-09-07 DIAGNOSIS — N2581 Secondary hyperparathyroidism of renal origin: Secondary | ICD-10-CM | POA: Diagnosis not present

## 2022-09-07 DIAGNOSIS — N186 End stage renal disease: Secondary | ICD-10-CM | POA: Diagnosis not present

## 2022-09-07 DIAGNOSIS — Z992 Dependence on renal dialysis: Secondary | ICD-10-CM | POA: Diagnosis not present

## 2022-09-09 DIAGNOSIS — Z992 Dependence on renal dialysis: Secondary | ICD-10-CM | POA: Diagnosis not present

## 2022-09-09 DIAGNOSIS — N186 End stage renal disease: Secondary | ICD-10-CM | POA: Diagnosis not present

## 2022-09-09 DIAGNOSIS — N2581 Secondary hyperparathyroidism of renal origin: Secondary | ICD-10-CM | POA: Diagnosis not present

## 2022-09-11 DIAGNOSIS — Z992 Dependence on renal dialysis: Secondary | ICD-10-CM | POA: Diagnosis not present

## 2022-09-11 DIAGNOSIS — N186 End stage renal disease: Secondary | ICD-10-CM | POA: Diagnosis not present

## 2022-09-11 DIAGNOSIS — N2581 Secondary hyperparathyroidism of renal origin: Secondary | ICD-10-CM | POA: Diagnosis not present

## 2022-09-13 IMAGING — DX DG CHEST 1V PORT
1 series · 1 of 1 positions shown · non-contrast
Comparison: 08/03/2008

CLINICAL DATA: Pulmonary edema

EXAM:
PORTABLE CHEST 1 VIEW

[chest]
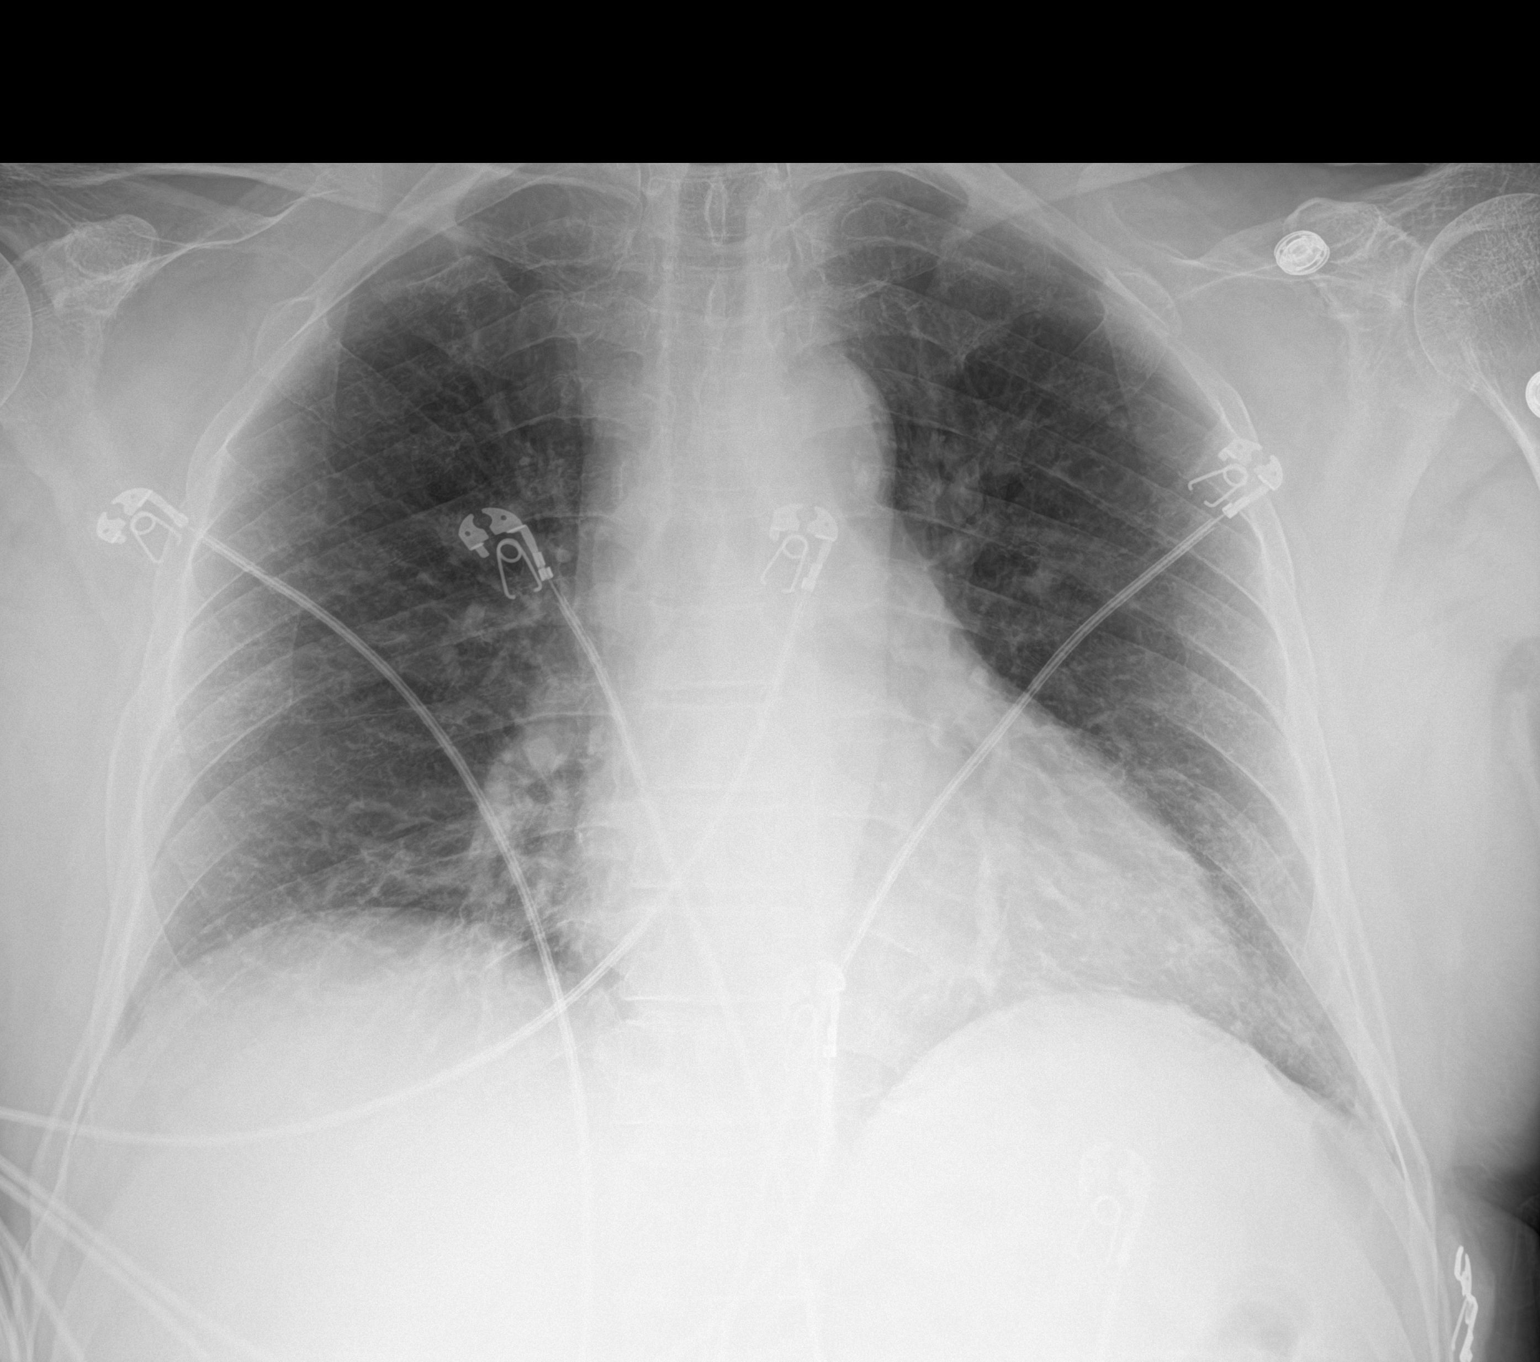

[1 of 1 positions shown; findings below may reference images not displayed]

FINDINGS: Mild bibasilar atelectasis. Lungs are otherwise clear. No
pneumothorax or pleural effusion. Cardiac size within normal limits.
Pulmonary vascularity is normal. No acute bone abnormality.
IMPRESSION: No active disease.

## 2022-09-14 DIAGNOSIS — Z992 Dependence on renal dialysis: Secondary | ICD-10-CM | POA: Diagnosis not present

## 2022-09-14 DIAGNOSIS — N186 End stage renal disease: Secondary | ICD-10-CM | POA: Diagnosis not present

## 2022-09-14 DIAGNOSIS — N2581 Secondary hyperparathyroidism of renal origin: Secondary | ICD-10-CM | POA: Diagnosis not present

## 2022-09-14 IMAGING — US US RENAL
1 series · 14 of 25 positions shown · non-contrast
Comparison: Abdominal ultrasound 06/05/2021

CLINICAL DATA: Renal failure.

EXAM:
RENAL / URINARY TRACT ULTRASOUND COMPLETE

[Series 1: us renal · 14 of 39 slices shown]
[im 1/39]
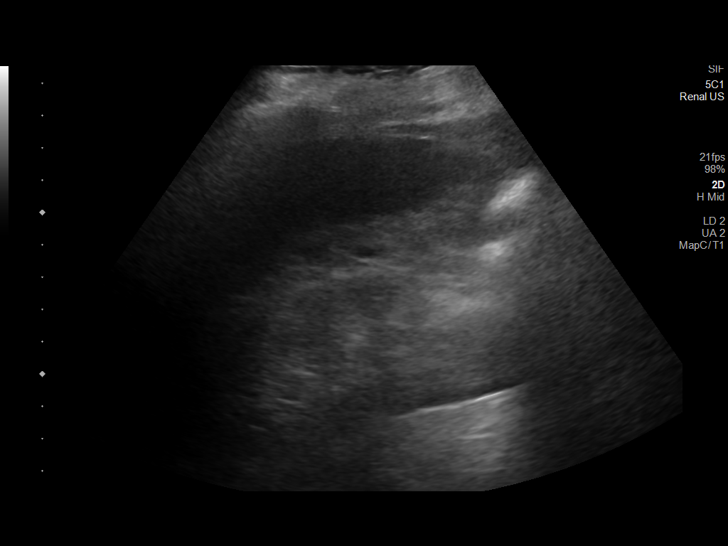
[im 4/39]
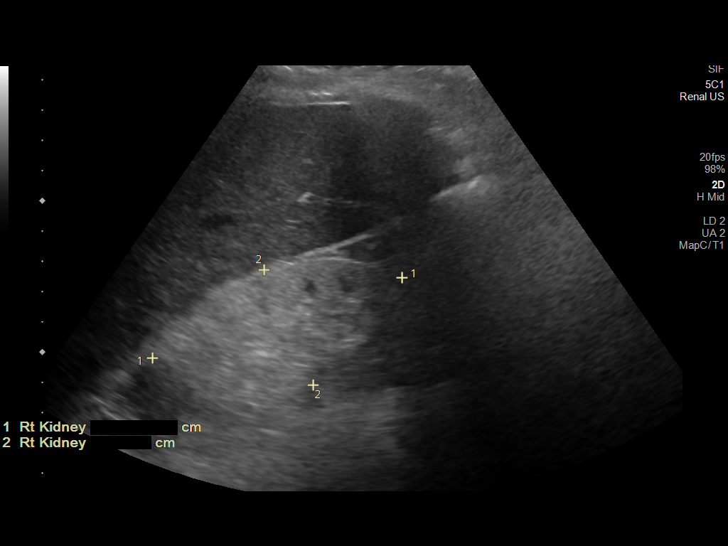
[im 7/39]
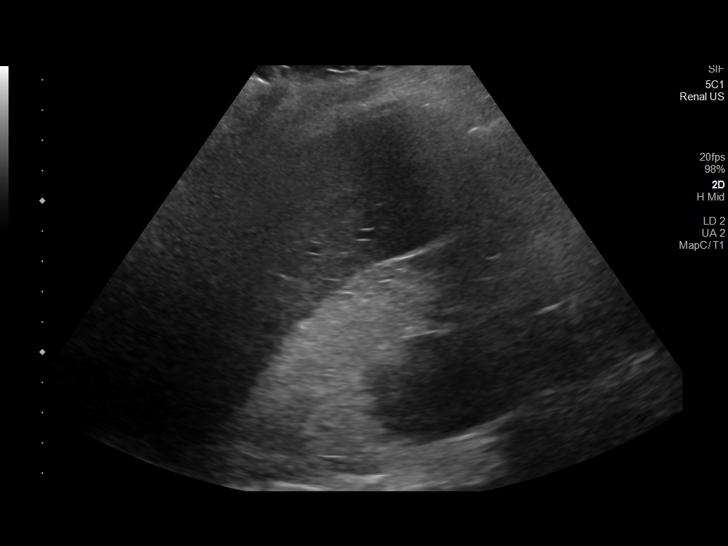
[im 10/39]
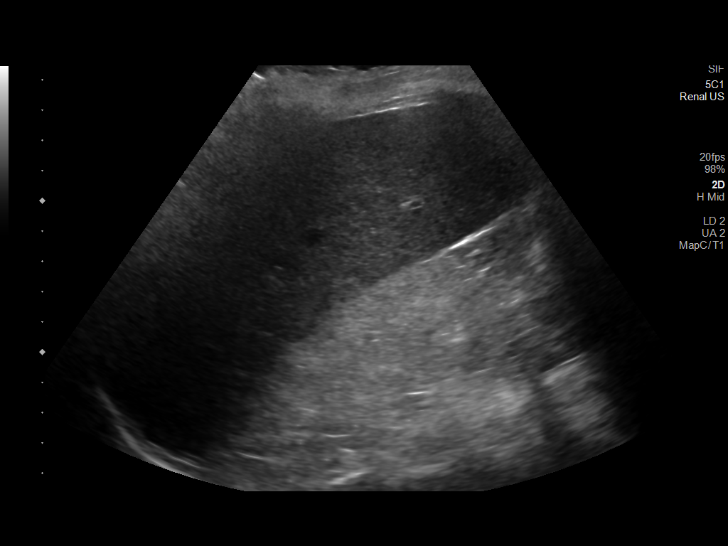
[im 13/39]
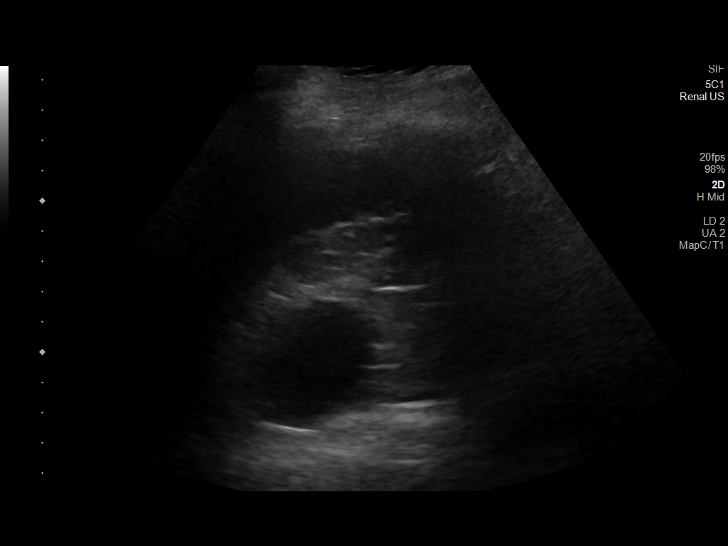
[im 15/39]
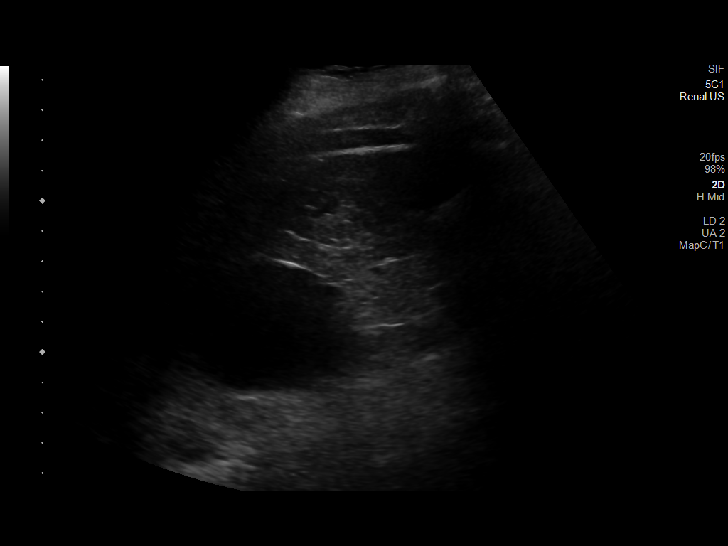
[im 18/39]
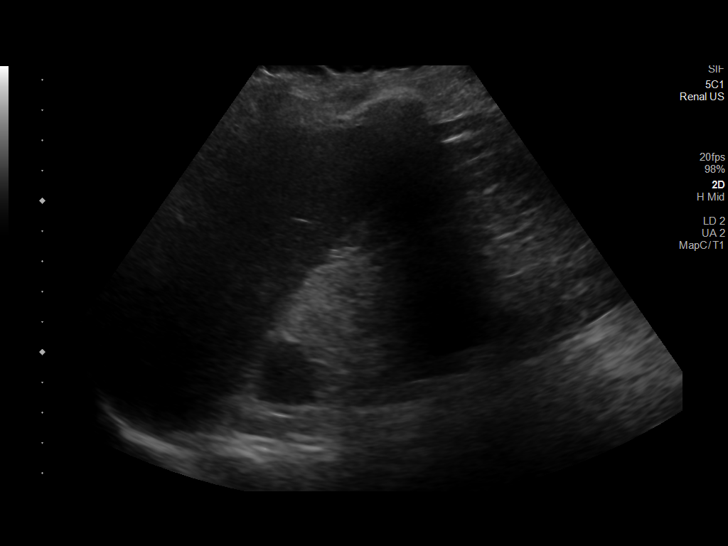
[im 21/39]
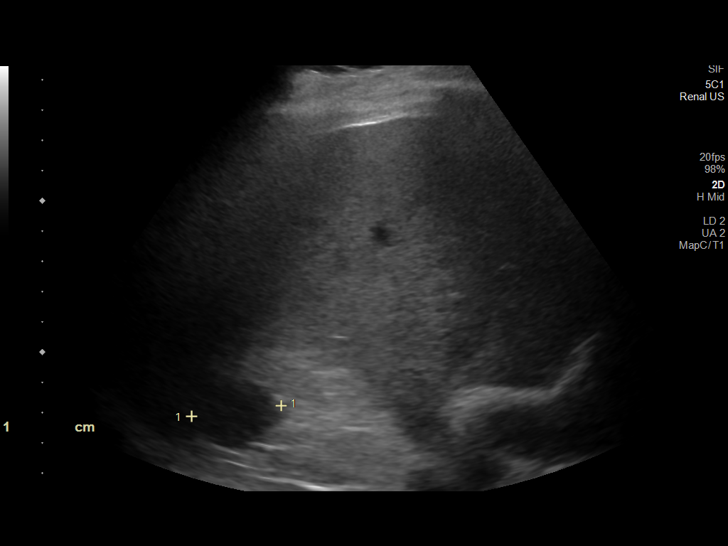
[im 24/39]
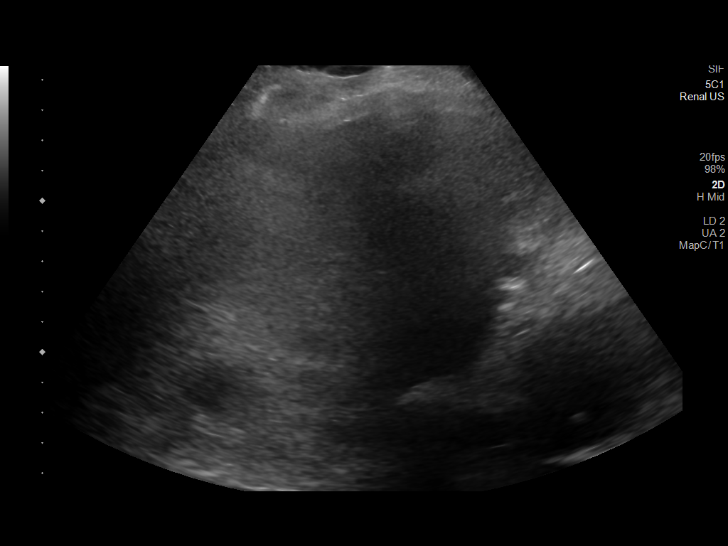
[im 26/39]
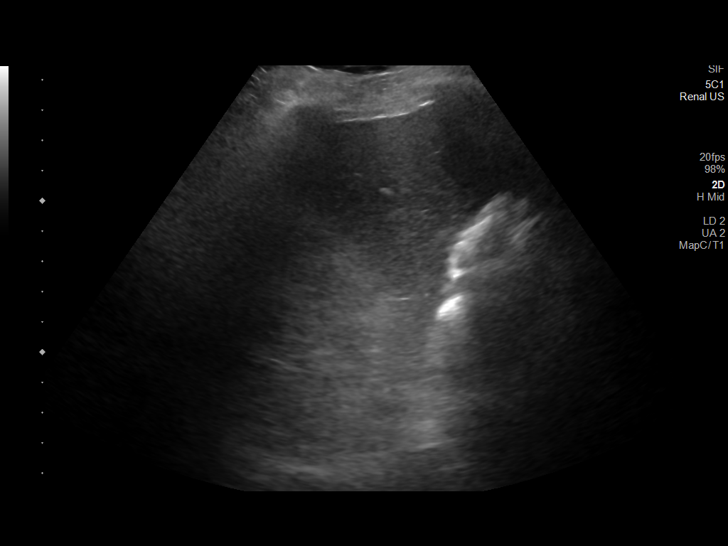
[im 29/39]
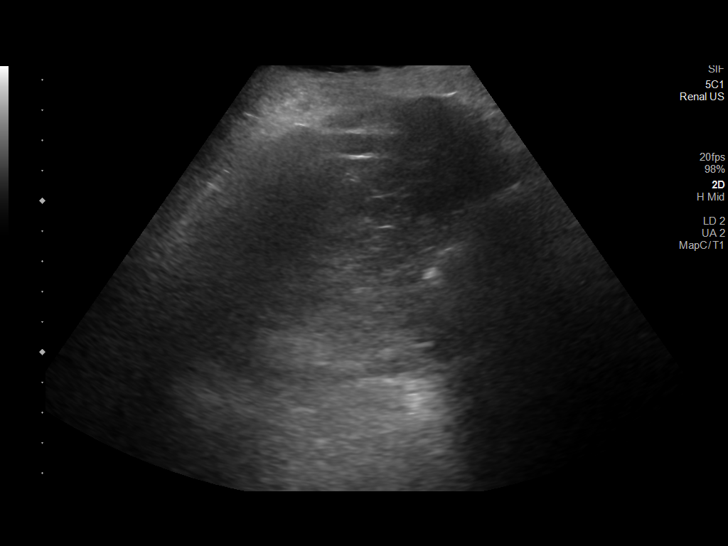
[im 32/39]
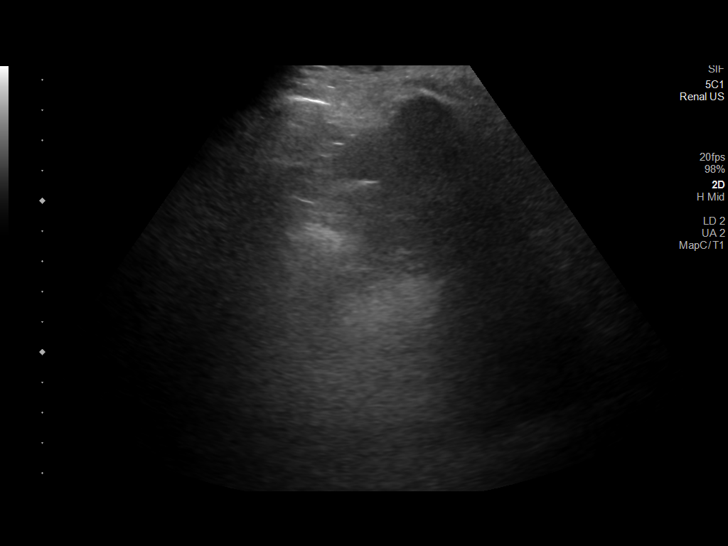
[im 35/39]
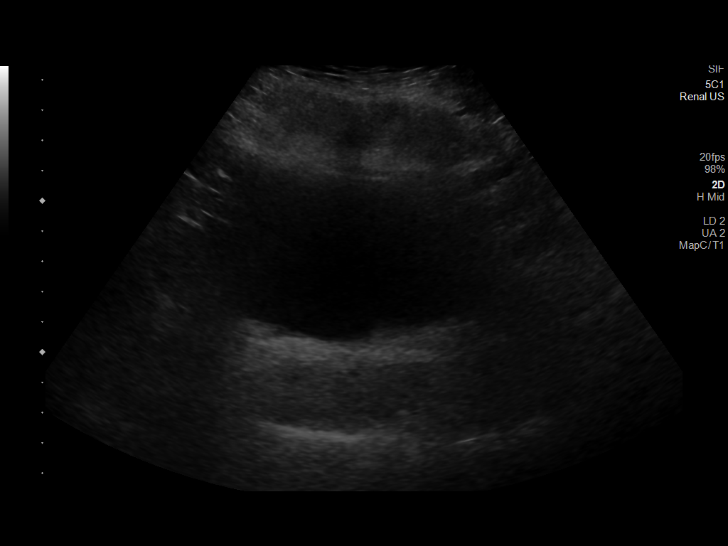
[im 39/39]
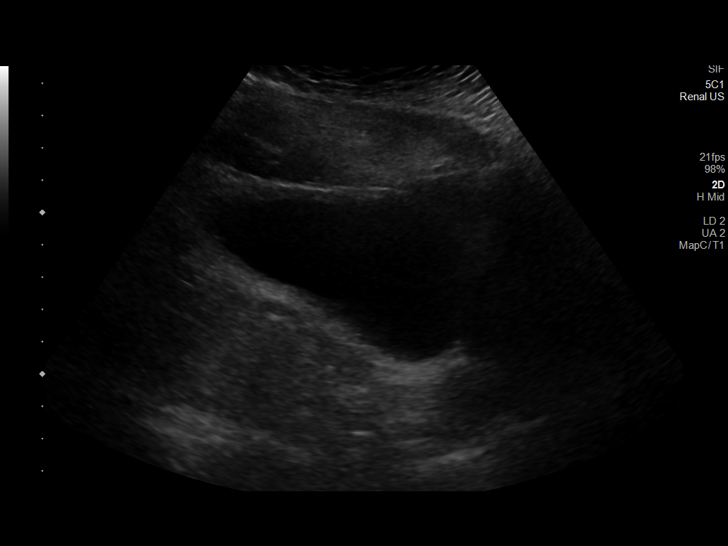

[14 of 25 positions shown; findings below may reference images not displayed]

FINDINGS: Right Kidney:

Renal measurements: 8.7 x 4.1 x 3.7 cm = volume: 70 mL. 5.5 cm
cystic lesion identified upper pole right kidney. 3 cm cystic lesion
identified towards the interpolar region. No hydronephrosis.

Left Kidney:

Left kidney could not be visualized.

Bladder:

Appears normal for degree of bladder distention.

Other:

None.
IMPRESSION: 1. No hydronephrosis in the right kidney. Left kidney could not be
visualized.
2. Similar appearance 5.5 cm simple appearing cyst in the upper pole
right kidney with smaller interpolar cystic lesion evident.

## 2022-09-15 IMAGING — RF DG C-ARM 1-60 MIN
1 series · 1 of 1 positions shown · IV contrast (agent unspecified)
Comparison: None.

CLINICAL DATA: Dialysis catheter

EXAM:
DG C-ARM 1-60 MIN
CONTRAST:  Insertion in the operating room.  None
FLUOROSCOPY:
4 seconds (0.6 mGy)

[Series 1: dg no report - auto finalize · right · 0.20mm/px · 1 of 1 slices shown]
[im 1/1]
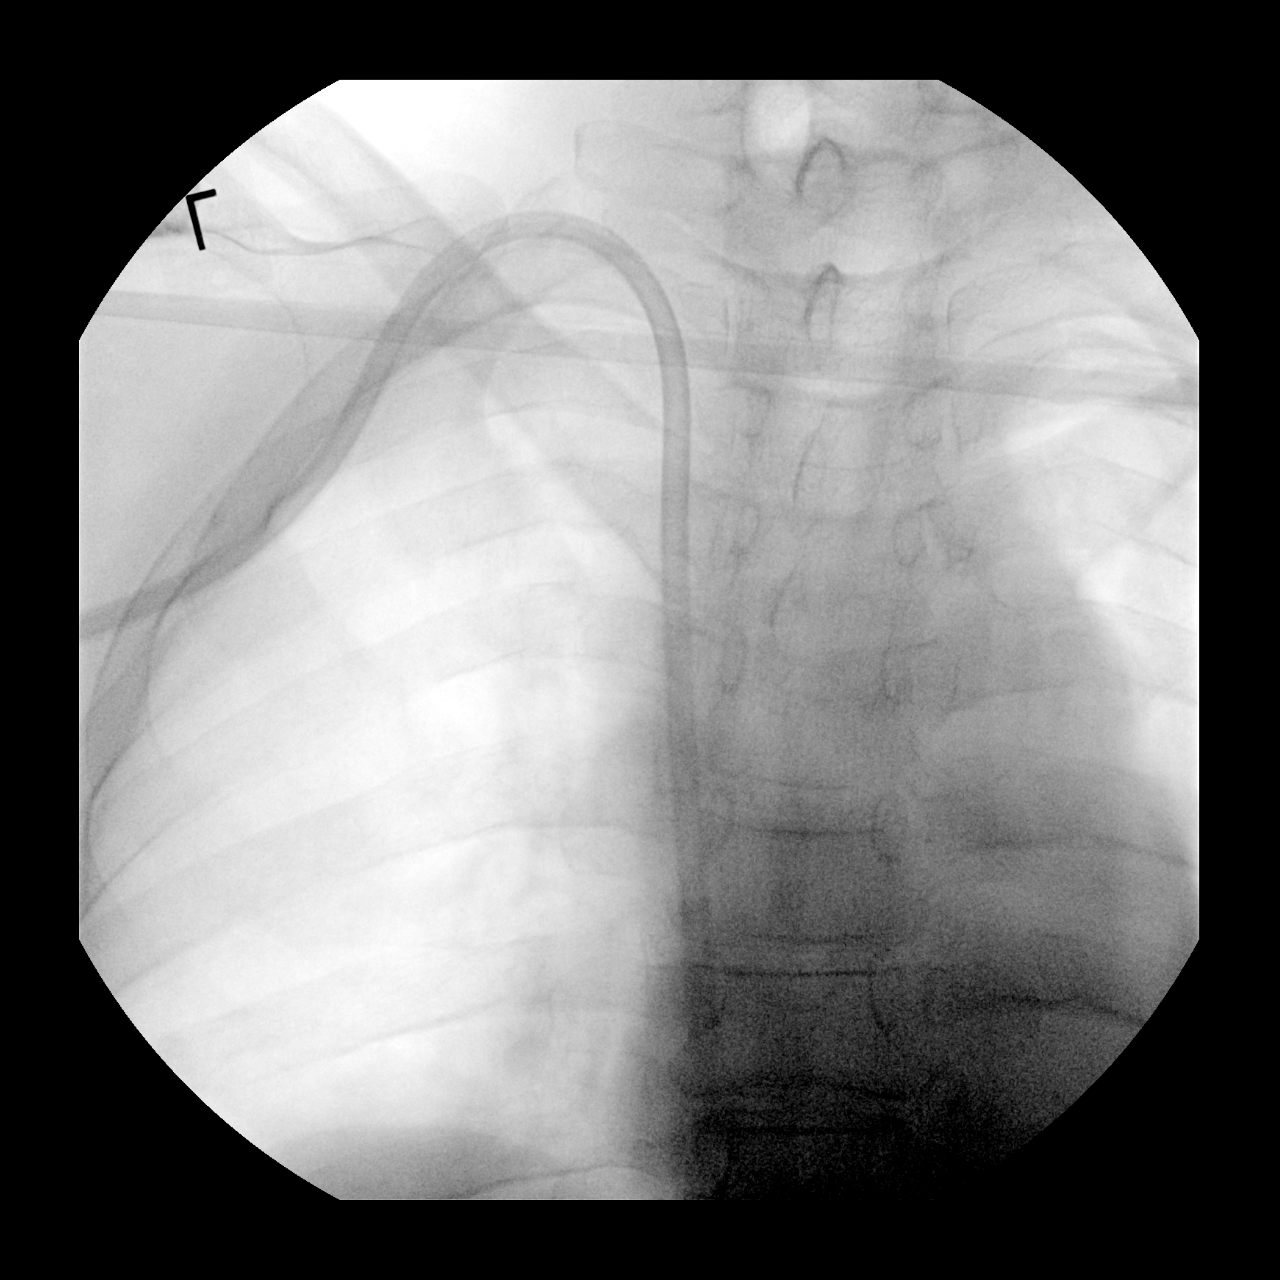

[1 of 1 positions shown; findings below may reference images not displayed]

FINDINGS: A single spot fluoroscopic image of the right upper chest is
provided for review and demonstrate sequela of a presumed right
jugular approach hemodialysis catheter with tip overlying the
expected location of the superior aspect the right atrium. No
definite pneumothorax given technique.
IMPRESSION: Post right jugular approach dialysis catheter placement without
evidence of complication on this intraoperative fluoroscopic image.

## 2022-09-15 IMAGING — DX DG CHEST 1V PORT
1 series · 1 of 1 positions shown · non-contrast
Comparison: 07/06/2021.

CLINICAL DATA: Calcis catheter placement.

EXAM:
PORTABLE CHEST 1 VIEW

[chest]
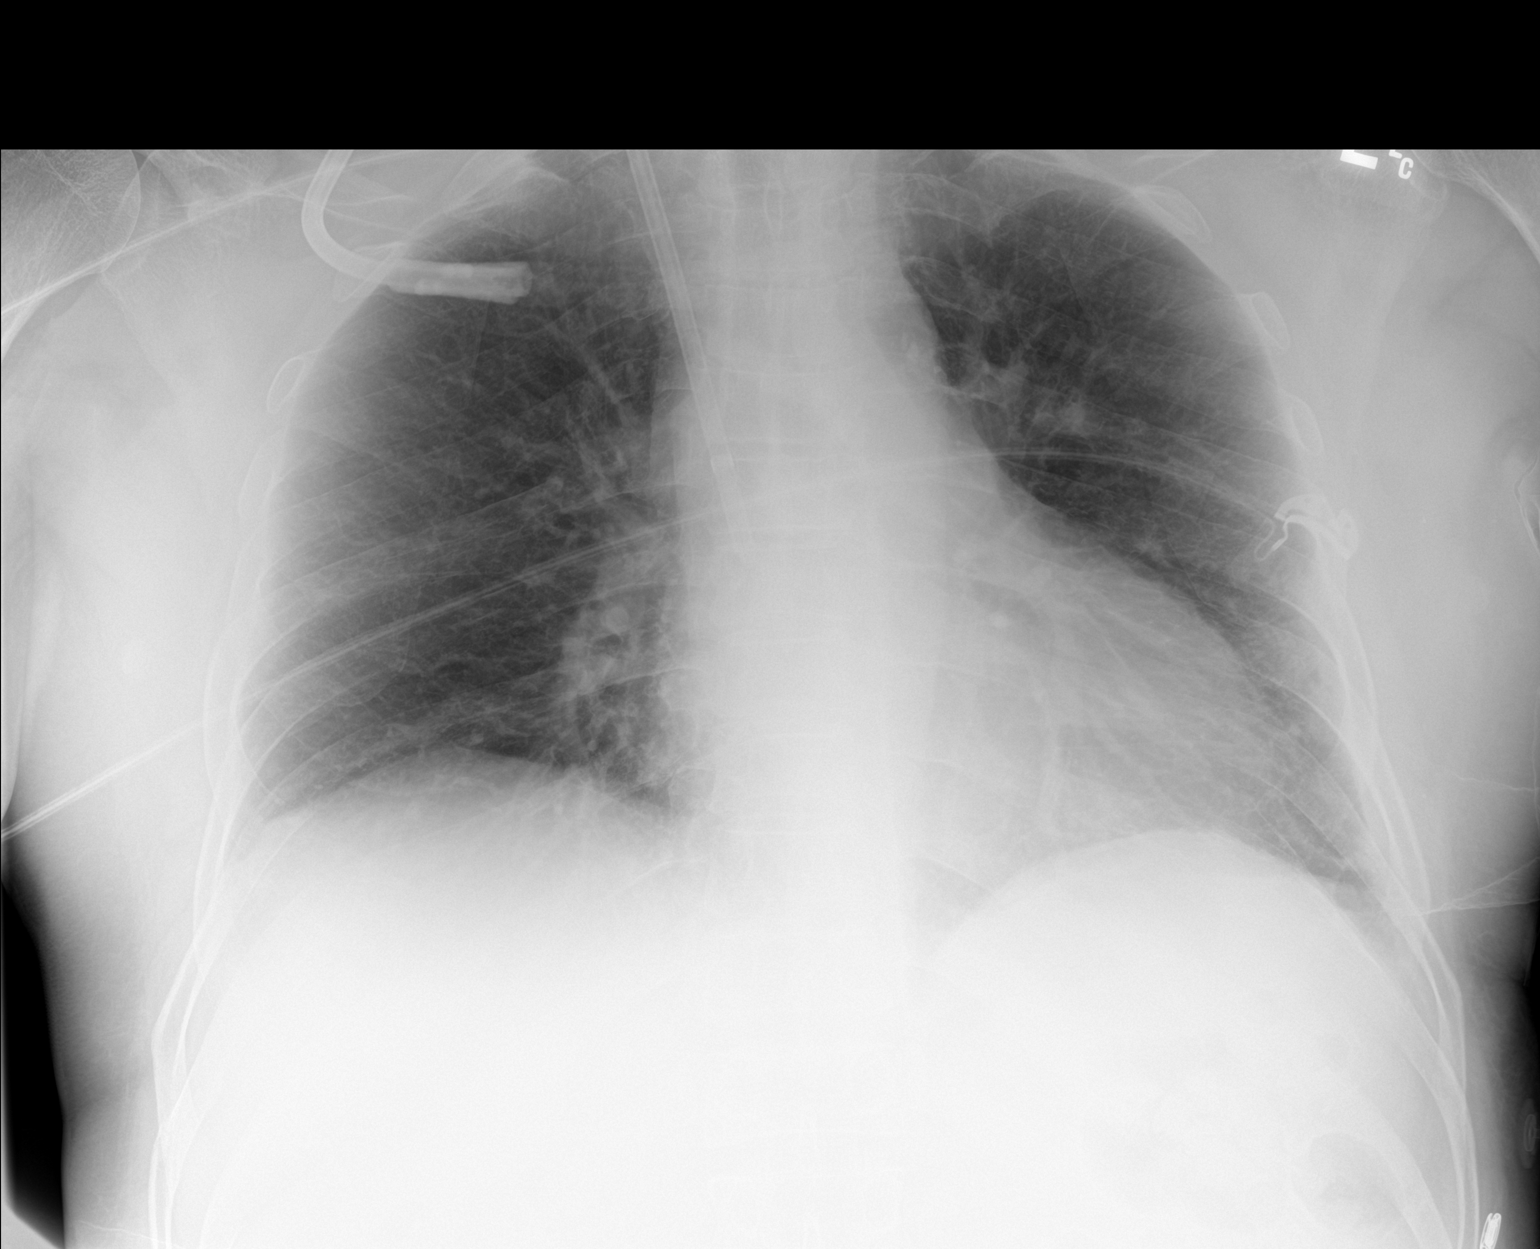

[1 of 1 positions shown; findings below may reference images not displayed]

FINDINGS: Right IJ dialysis catheter tip is in the low SVC. Heart size minimal
streaky atelectasis or scarring in the lung bases. No airspace
consolidation or pleural fluid. No pneumothorax.
IMPRESSION: Minimal streaky bibasilar atelectasis/scarring.

## 2022-09-16 DIAGNOSIS — N186 End stage renal disease: Secondary | ICD-10-CM | POA: Diagnosis not present

## 2022-09-16 DIAGNOSIS — N2581 Secondary hyperparathyroidism of renal origin: Secondary | ICD-10-CM | POA: Diagnosis not present

## 2022-09-16 DIAGNOSIS — Z992 Dependence on renal dialysis: Secondary | ICD-10-CM | POA: Diagnosis not present

## 2022-09-18 DIAGNOSIS — Z992 Dependence on renal dialysis: Secondary | ICD-10-CM | POA: Diagnosis not present

## 2022-09-18 DIAGNOSIS — N2581 Secondary hyperparathyroidism of renal origin: Secondary | ICD-10-CM | POA: Diagnosis not present

## 2022-09-18 DIAGNOSIS — N186 End stage renal disease: Secondary | ICD-10-CM | POA: Diagnosis not present

## 2022-09-19 DIAGNOSIS — Z992 Dependence on renal dialysis: Secondary | ICD-10-CM | POA: Diagnosis not present

## 2022-09-19 DIAGNOSIS — N186 End stage renal disease: Secondary | ICD-10-CM | POA: Diagnosis not present

## 2022-09-19 DIAGNOSIS — I129 Hypertensive chronic kidney disease with stage 1 through stage 4 chronic kidney disease, or unspecified chronic kidney disease: Secondary | ICD-10-CM | POA: Diagnosis not present

## 2022-09-21 DIAGNOSIS — N2581 Secondary hyperparathyroidism of renal origin: Secondary | ICD-10-CM | POA: Diagnosis not present

## 2022-09-21 DIAGNOSIS — Z992 Dependence on renal dialysis: Secondary | ICD-10-CM | POA: Diagnosis not present

## 2022-09-21 DIAGNOSIS — N186 End stage renal disease: Secondary | ICD-10-CM | POA: Diagnosis not present

## 2022-09-23 DIAGNOSIS — N186 End stage renal disease: Secondary | ICD-10-CM | POA: Diagnosis not present

## 2022-09-23 DIAGNOSIS — Z992 Dependence on renal dialysis: Secondary | ICD-10-CM | POA: Diagnosis not present

## 2022-09-23 DIAGNOSIS — N2581 Secondary hyperparathyroidism of renal origin: Secondary | ICD-10-CM | POA: Diagnosis not present

## 2022-09-25 DIAGNOSIS — N2581 Secondary hyperparathyroidism of renal origin: Secondary | ICD-10-CM | POA: Diagnosis not present

## 2022-09-25 DIAGNOSIS — Z992 Dependence on renal dialysis: Secondary | ICD-10-CM | POA: Diagnosis not present

## 2022-09-25 DIAGNOSIS — N186 End stage renal disease: Secondary | ICD-10-CM | POA: Diagnosis not present

## 2022-09-28 DIAGNOSIS — N2581 Secondary hyperparathyroidism of renal origin: Secondary | ICD-10-CM | POA: Diagnosis not present

## 2022-09-28 DIAGNOSIS — Z992 Dependence on renal dialysis: Secondary | ICD-10-CM | POA: Diagnosis not present

## 2022-09-28 DIAGNOSIS — N186 End stage renal disease: Secondary | ICD-10-CM | POA: Diagnosis not present

## 2022-09-30 DIAGNOSIS — Z992 Dependence on renal dialysis: Secondary | ICD-10-CM | POA: Diagnosis not present

## 2022-09-30 DIAGNOSIS — N186 End stage renal disease: Secondary | ICD-10-CM | POA: Diagnosis not present

## 2022-09-30 DIAGNOSIS — N2581 Secondary hyperparathyroidism of renal origin: Secondary | ICD-10-CM | POA: Diagnosis not present

## 2022-10-02 DIAGNOSIS — N2581 Secondary hyperparathyroidism of renal origin: Secondary | ICD-10-CM | POA: Diagnosis not present

## 2022-10-02 DIAGNOSIS — Z992 Dependence on renal dialysis: Secondary | ICD-10-CM | POA: Diagnosis not present

## 2022-10-02 DIAGNOSIS — N186 End stage renal disease: Secondary | ICD-10-CM | POA: Diagnosis not present

## 2022-10-04 ENCOUNTER — Other Ambulatory Visit: Payer: Self-pay | Admitting: Emergency Medicine

## 2022-10-05 DIAGNOSIS — N186 End stage renal disease: Secondary | ICD-10-CM | POA: Diagnosis not present

## 2022-10-05 DIAGNOSIS — Z992 Dependence on renal dialysis: Secondary | ICD-10-CM | POA: Diagnosis not present

## 2022-10-05 DIAGNOSIS — N2581 Secondary hyperparathyroidism of renal origin: Secondary | ICD-10-CM | POA: Diagnosis not present

## 2022-10-07 DIAGNOSIS — Z992 Dependence on renal dialysis: Secondary | ICD-10-CM | POA: Diagnosis not present

## 2022-10-07 DIAGNOSIS — N186 End stage renal disease: Secondary | ICD-10-CM | POA: Diagnosis not present

## 2022-10-07 DIAGNOSIS — N2581 Secondary hyperparathyroidism of renal origin: Secondary | ICD-10-CM | POA: Diagnosis not present

## 2022-10-09 DIAGNOSIS — Z992 Dependence on renal dialysis: Secondary | ICD-10-CM | POA: Diagnosis not present

## 2022-10-09 DIAGNOSIS — N186 End stage renal disease: Secondary | ICD-10-CM | POA: Diagnosis not present

## 2022-10-09 DIAGNOSIS — N2581 Secondary hyperparathyroidism of renal origin: Secondary | ICD-10-CM | POA: Diagnosis not present

## 2022-10-12 DIAGNOSIS — N186 End stage renal disease: Secondary | ICD-10-CM | POA: Diagnosis not present

## 2022-10-12 DIAGNOSIS — Z992 Dependence on renal dialysis: Secondary | ICD-10-CM | POA: Diagnosis not present

## 2022-10-12 DIAGNOSIS — N2581 Secondary hyperparathyroidism of renal origin: Secondary | ICD-10-CM | POA: Diagnosis not present

## 2022-10-14 DIAGNOSIS — Z992 Dependence on renal dialysis: Secondary | ICD-10-CM | POA: Diagnosis not present

## 2022-10-14 DIAGNOSIS — N186 End stage renal disease: Secondary | ICD-10-CM | POA: Diagnosis not present

## 2022-10-14 DIAGNOSIS — N2581 Secondary hyperparathyroidism of renal origin: Secondary | ICD-10-CM | POA: Diagnosis not present

## 2022-10-16 DIAGNOSIS — N186 End stage renal disease: Secondary | ICD-10-CM | POA: Diagnosis not present

## 2022-10-16 DIAGNOSIS — N2581 Secondary hyperparathyroidism of renal origin: Secondary | ICD-10-CM | POA: Diagnosis not present

## 2022-10-16 DIAGNOSIS — Z992 Dependence on renal dialysis: Secondary | ICD-10-CM | POA: Diagnosis not present

## 2022-10-19 DIAGNOSIS — Z992 Dependence on renal dialysis: Secondary | ICD-10-CM | POA: Diagnosis not present

## 2022-10-19 DIAGNOSIS — N2581 Secondary hyperparathyroidism of renal origin: Secondary | ICD-10-CM | POA: Diagnosis not present

## 2022-10-19 DIAGNOSIS — N186 End stage renal disease: Secondary | ICD-10-CM | POA: Diagnosis not present

## 2022-10-20 DIAGNOSIS — I129 Hypertensive chronic kidney disease with stage 1 through stage 4 chronic kidney disease, or unspecified chronic kidney disease: Secondary | ICD-10-CM | POA: Diagnosis not present

## 2022-10-20 DIAGNOSIS — Z992 Dependence on renal dialysis: Secondary | ICD-10-CM | POA: Diagnosis not present

## 2022-10-20 DIAGNOSIS — N186 End stage renal disease: Secondary | ICD-10-CM | POA: Diagnosis not present

## 2022-10-21 DIAGNOSIS — Z992 Dependence on renal dialysis: Secondary | ICD-10-CM | POA: Diagnosis not present

## 2022-10-21 DIAGNOSIS — N2581 Secondary hyperparathyroidism of renal origin: Secondary | ICD-10-CM | POA: Diagnosis not present

## 2022-10-21 DIAGNOSIS — N186 End stage renal disease: Secondary | ICD-10-CM | POA: Diagnosis not present

## 2022-10-23 DIAGNOSIS — Z992 Dependence on renal dialysis: Secondary | ICD-10-CM | POA: Diagnosis not present

## 2022-10-23 DIAGNOSIS — N186 End stage renal disease: Secondary | ICD-10-CM | POA: Diagnosis not present

## 2022-10-23 DIAGNOSIS — N2581 Secondary hyperparathyroidism of renal origin: Secondary | ICD-10-CM | POA: Diagnosis not present

## 2022-10-26 DIAGNOSIS — N2581 Secondary hyperparathyroidism of renal origin: Secondary | ICD-10-CM | POA: Diagnosis not present

## 2022-10-26 DIAGNOSIS — N186 End stage renal disease: Secondary | ICD-10-CM | POA: Diagnosis not present

## 2022-10-26 DIAGNOSIS — Z992 Dependence on renal dialysis: Secondary | ICD-10-CM | POA: Diagnosis not present

## 2022-10-28 DIAGNOSIS — Z992 Dependence on renal dialysis: Secondary | ICD-10-CM | POA: Diagnosis not present

## 2022-10-28 DIAGNOSIS — N186 End stage renal disease: Secondary | ICD-10-CM | POA: Diagnosis not present

## 2022-10-28 DIAGNOSIS — N2581 Secondary hyperparathyroidism of renal origin: Secondary | ICD-10-CM | POA: Diagnosis not present

## 2022-10-30 DIAGNOSIS — Z992 Dependence on renal dialysis: Secondary | ICD-10-CM | POA: Diagnosis not present

## 2022-10-30 DIAGNOSIS — N2581 Secondary hyperparathyroidism of renal origin: Secondary | ICD-10-CM | POA: Diagnosis not present

## 2022-10-30 DIAGNOSIS — N186 End stage renal disease: Secondary | ICD-10-CM | POA: Diagnosis not present

## 2022-11-02 DIAGNOSIS — N2581 Secondary hyperparathyroidism of renal origin: Secondary | ICD-10-CM | POA: Diagnosis not present

## 2022-11-02 DIAGNOSIS — Z992 Dependence on renal dialysis: Secondary | ICD-10-CM | POA: Diagnosis not present

## 2022-11-02 DIAGNOSIS — N186 End stage renal disease: Secondary | ICD-10-CM | POA: Diagnosis not present

## 2022-11-04 DIAGNOSIS — Z992 Dependence on renal dialysis: Secondary | ICD-10-CM | POA: Diagnosis not present

## 2022-11-04 DIAGNOSIS — N186 End stage renal disease: Secondary | ICD-10-CM | POA: Diagnosis not present

## 2022-11-04 DIAGNOSIS — N2581 Secondary hyperparathyroidism of renal origin: Secondary | ICD-10-CM | POA: Diagnosis not present

## 2022-11-06 DIAGNOSIS — N186 End stage renal disease: Secondary | ICD-10-CM | POA: Diagnosis not present

## 2022-11-06 DIAGNOSIS — N2581 Secondary hyperparathyroidism of renal origin: Secondary | ICD-10-CM | POA: Diagnosis not present

## 2022-11-06 DIAGNOSIS — Z992 Dependence on renal dialysis: Secondary | ICD-10-CM | POA: Diagnosis not present

## 2022-11-09 DIAGNOSIS — Z992 Dependence on renal dialysis: Secondary | ICD-10-CM | POA: Diagnosis not present

## 2022-11-09 DIAGNOSIS — N2581 Secondary hyperparathyroidism of renal origin: Secondary | ICD-10-CM | POA: Diagnosis not present

## 2022-11-09 DIAGNOSIS — N186 End stage renal disease: Secondary | ICD-10-CM | POA: Diagnosis not present

## 2022-11-11 DIAGNOSIS — N186 End stage renal disease: Secondary | ICD-10-CM | POA: Diagnosis not present

## 2022-11-11 DIAGNOSIS — Z992 Dependence on renal dialysis: Secondary | ICD-10-CM | POA: Diagnosis not present

## 2022-11-11 DIAGNOSIS — N2581 Secondary hyperparathyroidism of renal origin: Secondary | ICD-10-CM | POA: Diagnosis not present

## 2022-11-13 DIAGNOSIS — Z992 Dependence on renal dialysis: Secondary | ICD-10-CM | POA: Diagnosis not present

## 2022-11-13 DIAGNOSIS — N2581 Secondary hyperparathyroidism of renal origin: Secondary | ICD-10-CM | POA: Diagnosis not present

## 2022-11-13 DIAGNOSIS — N186 End stage renal disease: Secondary | ICD-10-CM | POA: Diagnosis not present

## 2022-11-16 DIAGNOSIS — Z992 Dependence on renal dialysis: Secondary | ICD-10-CM | POA: Diagnosis not present

## 2022-11-16 DIAGNOSIS — N2581 Secondary hyperparathyroidism of renal origin: Secondary | ICD-10-CM | POA: Diagnosis not present

## 2022-11-16 DIAGNOSIS — N186 End stage renal disease: Secondary | ICD-10-CM | POA: Diagnosis not present

## 2022-11-18 DIAGNOSIS — Z992 Dependence on renal dialysis: Secondary | ICD-10-CM | POA: Diagnosis not present

## 2022-11-18 DIAGNOSIS — N2581 Secondary hyperparathyroidism of renal origin: Secondary | ICD-10-CM | POA: Diagnosis not present

## 2022-11-18 DIAGNOSIS — N186 End stage renal disease: Secondary | ICD-10-CM | POA: Diagnosis not present

## 2022-11-20 DIAGNOSIS — N186 End stage renal disease: Secondary | ICD-10-CM | POA: Diagnosis not present

## 2022-11-20 DIAGNOSIS — Z992 Dependence on renal dialysis: Secondary | ICD-10-CM | POA: Diagnosis not present

## 2022-11-20 DIAGNOSIS — I129 Hypertensive chronic kidney disease with stage 1 through stage 4 chronic kidney disease, or unspecified chronic kidney disease: Secondary | ICD-10-CM | POA: Diagnosis not present

## 2022-11-23 DIAGNOSIS — N186 End stage renal disease: Secondary | ICD-10-CM | POA: Diagnosis not present

## 2022-11-23 DIAGNOSIS — N2581 Secondary hyperparathyroidism of renal origin: Secondary | ICD-10-CM | POA: Diagnosis not present

## 2022-11-23 DIAGNOSIS — Z992 Dependence on renal dialysis: Secondary | ICD-10-CM | POA: Diagnosis not present

## 2022-11-25 DIAGNOSIS — N186 End stage renal disease: Secondary | ICD-10-CM | POA: Diagnosis not present

## 2022-11-25 DIAGNOSIS — N2581 Secondary hyperparathyroidism of renal origin: Secondary | ICD-10-CM | POA: Diagnosis not present

## 2022-11-25 DIAGNOSIS — Z992 Dependence on renal dialysis: Secondary | ICD-10-CM | POA: Diagnosis not present

## 2022-11-27 DIAGNOSIS — N186 End stage renal disease: Secondary | ICD-10-CM | POA: Diagnosis not present

## 2022-11-27 DIAGNOSIS — Z992 Dependence on renal dialysis: Secondary | ICD-10-CM | POA: Diagnosis not present

## 2022-11-27 DIAGNOSIS — N2581 Secondary hyperparathyroidism of renal origin: Secondary | ICD-10-CM | POA: Diagnosis not present

## 2022-11-30 DIAGNOSIS — Z992 Dependence on renal dialysis: Secondary | ICD-10-CM | POA: Diagnosis not present

## 2022-11-30 DIAGNOSIS — N186 End stage renal disease: Secondary | ICD-10-CM | POA: Diagnosis not present

## 2022-11-30 DIAGNOSIS — N2581 Secondary hyperparathyroidism of renal origin: Secondary | ICD-10-CM | POA: Diagnosis not present

## 2022-12-02 DIAGNOSIS — N2581 Secondary hyperparathyroidism of renal origin: Secondary | ICD-10-CM | POA: Diagnosis not present

## 2022-12-02 DIAGNOSIS — N186 End stage renal disease: Secondary | ICD-10-CM | POA: Diagnosis not present

## 2022-12-02 DIAGNOSIS — Z992 Dependence on renal dialysis: Secondary | ICD-10-CM | POA: Diagnosis not present

## 2022-12-04 DIAGNOSIS — N2581 Secondary hyperparathyroidism of renal origin: Secondary | ICD-10-CM | POA: Diagnosis not present

## 2022-12-04 DIAGNOSIS — Z992 Dependence on renal dialysis: Secondary | ICD-10-CM | POA: Diagnosis not present

## 2022-12-04 DIAGNOSIS — N186 End stage renal disease: Secondary | ICD-10-CM | POA: Diagnosis not present

## 2022-12-07 DIAGNOSIS — N2581 Secondary hyperparathyroidism of renal origin: Secondary | ICD-10-CM | POA: Diagnosis not present

## 2022-12-07 DIAGNOSIS — N186 End stage renal disease: Secondary | ICD-10-CM | POA: Diagnosis not present

## 2022-12-07 DIAGNOSIS — Z992 Dependence on renal dialysis: Secondary | ICD-10-CM | POA: Diagnosis not present

## 2022-12-09 DIAGNOSIS — Z992 Dependence on renal dialysis: Secondary | ICD-10-CM | POA: Diagnosis not present

## 2022-12-09 DIAGNOSIS — N186 End stage renal disease: Secondary | ICD-10-CM | POA: Diagnosis not present

## 2022-12-09 DIAGNOSIS — N2581 Secondary hyperparathyroidism of renal origin: Secondary | ICD-10-CM | POA: Diagnosis not present

## 2022-12-11 DIAGNOSIS — N2581 Secondary hyperparathyroidism of renal origin: Secondary | ICD-10-CM | POA: Diagnosis not present

## 2022-12-11 DIAGNOSIS — N186 End stage renal disease: Secondary | ICD-10-CM | POA: Diagnosis not present

## 2022-12-11 DIAGNOSIS — Z992 Dependence on renal dialysis: Secondary | ICD-10-CM | POA: Diagnosis not present

## 2022-12-13 ENCOUNTER — Other Ambulatory Visit: Payer: Self-pay | Admitting: Emergency Medicine

## 2022-12-14 DIAGNOSIS — Z992 Dependence on renal dialysis: Secondary | ICD-10-CM | POA: Diagnosis not present

## 2022-12-14 DIAGNOSIS — N2581 Secondary hyperparathyroidism of renal origin: Secondary | ICD-10-CM | POA: Diagnosis not present

## 2022-12-14 DIAGNOSIS — N186 End stage renal disease: Secondary | ICD-10-CM | POA: Diagnosis not present

## 2022-12-16 DIAGNOSIS — N186 End stage renal disease: Secondary | ICD-10-CM | POA: Diagnosis not present

## 2022-12-16 DIAGNOSIS — Z992 Dependence on renal dialysis: Secondary | ICD-10-CM | POA: Diagnosis not present

## 2022-12-16 DIAGNOSIS — N2581 Secondary hyperparathyroidism of renal origin: Secondary | ICD-10-CM | POA: Diagnosis not present

## 2022-12-18 DIAGNOSIS — N186 End stage renal disease: Secondary | ICD-10-CM | POA: Diagnosis not present

## 2022-12-18 DIAGNOSIS — N2581 Secondary hyperparathyroidism of renal origin: Secondary | ICD-10-CM | POA: Diagnosis not present

## 2022-12-18 DIAGNOSIS — Z992 Dependence on renal dialysis: Secondary | ICD-10-CM | POA: Diagnosis not present

## 2022-12-20 DIAGNOSIS — N186 End stage renal disease: Secondary | ICD-10-CM | POA: Diagnosis not present

## 2022-12-20 DIAGNOSIS — I129 Hypertensive chronic kidney disease with stage 1 through stage 4 chronic kidney disease, or unspecified chronic kidney disease: Secondary | ICD-10-CM | POA: Diagnosis not present

## 2022-12-20 DIAGNOSIS — Z992 Dependence on renal dialysis: Secondary | ICD-10-CM | POA: Diagnosis not present

## 2022-12-21 DIAGNOSIS — Z992 Dependence on renal dialysis: Secondary | ICD-10-CM | POA: Diagnosis not present

## 2022-12-21 DIAGNOSIS — N186 End stage renal disease: Secondary | ICD-10-CM | POA: Diagnosis not present

## 2022-12-21 DIAGNOSIS — N2581 Secondary hyperparathyroidism of renal origin: Secondary | ICD-10-CM | POA: Diagnosis not present

## 2022-12-23 DIAGNOSIS — N2581 Secondary hyperparathyroidism of renal origin: Secondary | ICD-10-CM | POA: Diagnosis not present

## 2022-12-23 DIAGNOSIS — N186 End stage renal disease: Secondary | ICD-10-CM | POA: Diagnosis not present

## 2022-12-23 DIAGNOSIS — Z992 Dependence on renal dialysis: Secondary | ICD-10-CM | POA: Diagnosis not present

## 2022-12-25 DIAGNOSIS — N186 End stage renal disease: Secondary | ICD-10-CM | POA: Diagnosis not present

## 2022-12-25 DIAGNOSIS — Z992 Dependence on renal dialysis: Secondary | ICD-10-CM | POA: Diagnosis not present

## 2022-12-25 DIAGNOSIS — N2581 Secondary hyperparathyroidism of renal origin: Secondary | ICD-10-CM | POA: Diagnosis not present

## 2022-12-28 DIAGNOSIS — Z992 Dependence on renal dialysis: Secondary | ICD-10-CM | POA: Diagnosis not present

## 2022-12-28 DIAGNOSIS — N2581 Secondary hyperparathyroidism of renal origin: Secondary | ICD-10-CM | POA: Diagnosis not present

## 2022-12-28 DIAGNOSIS — N186 End stage renal disease: Secondary | ICD-10-CM | POA: Diagnosis not present

## 2022-12-30 DIAGNOSIS — Z992 Dependence on renal dialysis: Secondary | ICD-10-CM | POA: Diagnosis not present

## 2022-12-30 DIAGNOSIS — N186 End stage renal disease: Secondary | ICD-10-CM | POA: Diagnosis not present

## 2022-12-30 DIAGNOSIS — N2581 Secondary hyperparathyroidism of renal origin: Secondary | ICD-10-CM | POA: Diagnosis not present

## 2023-01-01 DIAGNOSIS — Z992 Dependence on renal dialysis: Secondary | ICD-10-CM | POA: Diagnosis not present

## 2023-01-01 DIAGNOSIS — N186 End stage renal disease: Secondary | ICD-10-CM | POA: Diagnosis not present

## 2023-01-01 DIAGNOSIS — N2581 Secondary hyperparathyroidism of renal origin: Secondary | ICD-10-CM | POA: Diagnosis not present

## 2023-01-04 DIAGNOSIS — N186 End stage renal disease: Secondary | ICD-10-CM | POA: Diagnosis not present

## 2023-01-04 DIAGNOSIS — Z992 Dependence on renal dialysis: Secondary | ICD-10-CM | POA: Diagnosis not present

## 2023-01-04 DIAGNOSIS — N2581 Secondary hyperparathyroidism of renal origin: Secondary | ICD-10-CM | POA: Diagnosis not present

## 2023-01-06 DIAGNOSIS — Z992 Dependence on renal dialysis: Secondary | ICD-10-CM | POA: Diagnosis not present

## 2023-01-06 DIAGNOSIS — N2581 Secondary hyperparathyroidism of renal origin: Secondary | ICD-10-CM | POA: Diagnosis not present

## 2023-01-06 DIAGNOSIS — N186 End stage renal disease: Secondary | ICD-10-CM | POA: Diagnosis not present

## 2023-01-08 DIAGNOSIS — N2581 Secondary hyperparathyroidism of renal origin: Secondary | ICD-10-CM | POA: Diagnosis not present

## 2023-01-08 DIAGNOSIS — N186 End stage renal disease: Secondary | ICD-10-CM | POA: Diagnosis not present

## 2023-01-08 DIAGNOSIS — Z992 Dependence on renal dialysis: Secondary | ICD-10-CM | POA: Diagnosis not present

## 2023-01-11 DIAGNOSIS — N186 End stage renal disease: Secondary | ICD-10-CM | POA: Diagnosis not present

## 2023-01-11 DIAGNOSIS — Z992 Dependence on renal dialysis: Secondary | ICD-10-CM | POA: Diagnosis not present

## 2023-01-11 DIAGNOSIS — N2581 Secondary hyperparathyroidism of renal origin: Secondary | ICD-10-CM | POA: Diagnosis not present

## 2023-01-15 DIAGNOSIS — N2581 Secondary hyperparathyroidism of renal origin: Secondary | ICD-10-CM | POA: Diagnosis not present

## 2023-01-15 DIAGNOSIS — Z992 Dependence on renal dialysis: Secondary | ICD-10-CM | POA: Diagnosis not present

## 2023-01-15 DIAGNOSIS — N186 End stage renal disease: Secondary | ICD-10-CM | POA: Diagnosis not present

## 2023-01-18 DIAGNOSIS — N2581 Secondary hyperparathyroidism of renal origin: Secondary | ICD-10-CM | POA: Diagnosis not present

## 2023-01-18 DIAGNOSIS — N186 End stage renal disease: Secondary | ICD-10-CM | POA: Diagnosis not present

## 2023-01-18 DIAGNOSIS — Z992 Dependence on renal dialysis: Secondary | ICD-10-CM | POA: Diagnosis not present

## 2023-01-20 DIAGNOSIS — I129 Hypertensive chronic kidney disease with stage 1 through stage 4 chronic kidney disease, or unspecified chronic kidney disease: Secondary | ICD-10-CM | POA: Diagnosis not present

## 2023-01-20 DIAGNOSIS — N2581 Secondary hyperparathyroidism of renal origin: Secondary | ICD-10-CM | POA: Diagnosis not present

## 2023-01-20 DIAGNOSIS — Z992 Dependence on renal dialysis: Secondary | ICD-10-CM | POA: Diagnosis not present

## 2023-01-20 DIAGNOSIS — N186 End stage renal disease: Secondary | ICD-10-CM | POA: Diagnosis not present

## 2023-01-25 DIAGNOSIS — N2581 Secondary hyperparathyroidism of renal origin: Secondary | ICD-10-CM | POA: Diagnosis not present

## 2023-01-25 DIAGNOSIS — N186 End stage renal disease: Secondary | ICD-10-CM | POA: Diagnosis not present

## 2023-01-25 DIAGNOSIS — Z992 Dependence on renal dialysis: Secondary | ICD-10-CM | POA: Diagnosis not present

## 2023-01-27 DIAGNOSIS — N186 End stage renal disease: Secondary | ICD-10-CM | POA: Diagnosis not present

## 2023-01-27 DIAGNOSIS — Z992 Dependence on renal dialysis: Secondary | ICD-10-CM | POA: Diagnosis not present

## 2023-01-27 DIAGNOSIS — N2581 Secondary hyperparathyroidism of renal origin: Secondary | ICD-10-CM | POA: Diagnosis not present

## 2023-01-29 DIAGNOSIS — Z992 Dependence on renal dialysis: Secondary | ICD-10-CM | POA: Diagnosis not present

## 2023-01-29 DIAGNOSIS — N2581 Secondary hyperparathyroidism of renal origin: Secondary | ICD-10-CM | POA: Diagnosis not present

## 2023-01-29 DIAGNOSIS — N186 End stage renal disease: Secondary | ICD-10-CM | POA: Diagnosis not present

## 2023-02-01 DIAGNOSIS — N2581 Secondary hyperparathyroidism of renal origin: Secondary | ICD-10-CM | POA: Diagnosis not present

## 2023-02-01 DIAGNOSIS — N186 End stage renal disease: Secondary | ICD-10-CM | POA: Diagnosis not present

## 2023-02-01 DIAGNOSIS — Z992 Dependence on renal dialysis: Secondary | ICD-10-CM | POA: Diagnosis not present

## 2023-02-03 DIAGNOSIS — N2581 Secondary hyperparathyroidism of renal origin: Secondary | ICD-10-CM | POA: Diagnosis not present

## 2023-02-03 DIAGNOSIS — Z992 Dependence on renal dialysis: Secondary | ICD-10-CM | POA: Diagnosis not present

## 2023-02-03 DIAGNOSIS — N186 End stage renal disease: Secondary | ICD-10-CM | POA: Diagnosis not present

## 2023-02-05 DIAGNOSIS — N2581 Secondary hyperparathyroidism of renal origin: Secondary | ICD-10-CM | POA: Diagnosis not present

## 2023-02-05 DIAGNOSIS — N186 End stage renal disease: Secondary | ICD-10-CM | POA: Diagnosis not present

## 2023-02-05 DIAGNOSIS — Z992 Dependence on renal dialysis: Secondary | ICD-10-CM | POA: Diagnosis not present

## 2023-02-07 ENCOUNTER — Other Ambulatory Visit: Payer: Self-pay | Admitting: Emergency Medicine

## 2023-02-07 DIAGNOSIS — R12 Heartburn: Secondary | ICD-10-CM

## 2023-02-08 DIAGNOSIS — N2581 Secondary hyperparathyroidism of renal origin: Secondary | ICD-10-CM | POA: Diagnosis not present

## 2023-02-08 DIAGNOSIS — Z992 Dependence on renal dialysis: Secondary | ICD-10-CM | POA: Diagnosis not present

## 2023-02-08 DIAGNOSIS — N186 End stage renal disease: Secondary | ICD-10-CM | POA: Diagnosis not present

## 2023-02-10 DIAGNOSIS — N2581 Secondary hyperparathyroidism of renal origin: Secondary | ICD-10-CM | POA: Diagnosis not present

## 2023-02-10 DIAGNOSIS — Z992 Dependence on renal dialysis: Secondary | ICD-10-CM | POA: Diagnosis not present

## 2023-02-10 DIAGNOSIS — N186 End stage renal disease: Secondary | ICD-10-CM | POA: Diagnosis not present

## 2023-02-12 DIAGNOSIS — N186 End stage renal disease: Secondary | ICD-10-CM | POA: Diagnosis not present

## 2023-02-12 DIAGNOSIS — Z992 Dependence on renal dialysis: Secondary | ICD-10-CM | POA: Diagnosis not present

## 2023-02-12 DIAGNOSIS — N2581 Secondary hyperparathyroidism of renal origin: Secondary | ICD-10-CM | POA: Diagnosis not present

## 2023-02-14 DIAGNOSIS — Z992 Dependence on renal dialysis: Secondary | ICD-10-CM | POA: Diagnosis not present

## 2023-02-14 DIAGNOSIS — N2581 Secondary hyperparathyroidism of renal origin: Secondary | ICD-10-CM | POA: Diagnosis not present

## 2023-02-14 DIAGNOSIS — N186 End stage renal disease: Secondary | ICD-10-CM | POA: Diagnosis not present

## 2023-02-16 ENCOUNTER — Other Ambulatory Visit: Payer: Self-pay | Admitting: Emergency Medicine

## 2023-02-16 DIAGNOSIS — Z992 Dependence on renal dialysis: Secondary | ICD-10-CM | POA: Diagnosis not present

## 2023-02-16 DIAGNOSIS — N2581 Secondary hyperparathyroidism of renal origin: Secondary | ICD-10-CM | POA: Diagnosis not present

## 2023-02-16 DIAGNOSIS — N186 End stage renal disease: Secondary | ICD-10-CM | POA: Diagnosis not present

## 2023-02-19 DIAGNOSIS — N2581 Secondary hyperparathyroidism of renal origin: Secondary | ICD-10-CM | POA: Diagnosis not present

## 2023-02-19 DIAGNOSIS — Z992 Dependence on renal dialysis: Secondary | ICD-10-CM | POA: Diagnosis not present

## 2023-02-19 DIAGNOSIS — N186 End stage renal disease: Secondary | ICD-10-CM | POA: Diagnosis not present

## 2023-02-19 DIAGNOSIS — I129 Hypertensive chronic kidney disease with stage 1 through stage 4 chronic kidney disease, or unspecified chronic kidney disease: Secondary | ICD-10-CM | POA: Diagnosis not present

## 2023-02-22 DIAGNOSIS — Z992 Dependence on renal dialysis: Secondary | ICD-10-CM | POA: Diagnosis not present

## 2023-02-22 DIAGNOSIS — N186 End stage renal disease: Secondary | ICD-10-CM | POA: Diagnosis not present

## 2023-02-22 DIAGNOSIS — N2581 Secondary hyperparathyroidism of renal origin: Secondary | ICD-10-CM | POA: Diagnosis not present

## 2023-02-24 DIAGNOSIS — N2581 Secondary hyperparathyroidism of renal origin: Secondary | ICD-10-CM | POA: Diagnosis not present

## 2023-02-24 DIAGNOSIS — N186 End stage renal disease: Secondary | ICD-10-CM | POA: Diagnosis not present

## 2023-02-24 DIAGNOSIS — Z992 Dependence on renal dialysis: Secondary | ICD-10-CM | POA: Diagnosis not present

## 2023-03-01 DIAGNOSIS — N186 End stage renal disease: Secondary | ICD-10-CM | POA: Diagnosis not present

## 2023-03-01 DIAGNOSIS — N2581 Secondary hyperparathyroidism of renal origin: Secondary | ICD-10-CM | POA: Diagnosis not present

## 2023-03-01 DIAGNOSIS — Z992 Dependence on renal dialysis: Secondary | ICD-10-CM | POA: Diagnosis not present

## 2023-03-03 DIAGNOSIS — Z992 Dependence on renal dialysis: Secondary | ICD-10-CM | POA: Diagnosis not present

## 2023-03-03 DIAGNOSIS — N186 End stage renal disease: Secondary | ICD-10-CM | POA: Diagnosis not present

## 2023-03-03 DIAGNOSIS — N2581 Secondary hyperparathyroidism of renal origin: Secondary | ICD-10-CM | POA: Diagnosis not present

## 2023-03-05 DIAGNOSIS — N2581 Secondary hyperparathyroidism of renal origin: Secondary | ICD-10-CM | POA: Diagnosis not present

## 2023-03-05 DIAGNOSIS — Z992 Dependence on renal dialysis: Secondary | ICD-10-CM | POA: Diagnosis not present

## 2023-03-05 DIAGNOSIS — N186 End stage renal disease: Secondary | ICD-10-CM | POA: Diagnosis not present

## 2023-03-08 DIAGNOSIS — N186 End stage renal disease: Secondary | ICD-10-CM | POA: Diagnosis not present

## 2023-03-08 DIAGNOSIS — N2581 Secondary hyperparathyroidism of renal origin: Secondary | ICD-10-CM | POA: Diagnosis not present

## 2023-03-08 DIAGNOSIS — Z992 Dependence on renal dialysis: Secondary | ICD-10-CM | POA: Diagnosis not present

## 2023-03-10 DIAGNOSIS — Z992 Dependence on renal dialysis: Secondary | ICD-10-CM | POA: Diagnosis not present

## 2023-03-10 DIAGNOSIS — N2581 Secondary hyperparathyroidism of renal origin: Secondary | ICD-10-CM | POA: Diagnosis not present

## 2023-03-10 DIAGNOSIS — N186 End stage renal disease: Secondary | ICD-10-CM | POA: Diagnosis not present

## 2023-03-12 DIAGNOSIS — N2581 Secondary hyperparathyroidism of renal origin: Secondary | ICD-10-CM | POA: Diagnosis not present

## 2023-03-12 DIAGNOSIS — Z992 Dependence on renal dialysis: Secondary | ICD-10-CM | POA: Diagnosis not present

## 2023-03-12 DIAGNOSIS — N186 End stage renal disease: Secondary | ICD-10-CM | POA: Diagnosis not present

## 2023-03-14 DIAGNOSIS — Z992 Dependence on renal dialysis: Secondary | ICD-10-CM | POA: Diagnosis not present

## 2023-03-14 DIAGNOSIS — N2581 Secondary hyperparathyroidism of renal origin: Secondary | ICD-10-CM | POA: Diagnosis not present

## 2023-03-14 DIAGNOSIS — N186 End stage renal disease: Secondary | ICD-10-CM | POA: Diagnosis not present

## 2023-03-17 DIAGNOSIS — N2581 Secondary hyperparathyroidism of renal origin: Secondary | ICD-10-CM | POA: Diagnosis not present

## 2023-03-17 DIAGNOSIS — N186 End stage renal disease: Secondary | ICD-10-CM | POA: Diagnosis not present

## 2023-03-17 DIAGNOSIS — Z992 Dependence on renal dialysis: Secondary | ICD-10-CM | POA: Diagnosis not present

## 2023-03-19 DIAGNOSIS — N2581 Secondary hyperparathyroidism of renal origin: Secondary | ICD-10-CM | POA: Diagnosis not present

## 2023-03-19 DIAGNOSIS — N186 End stage renal disease: Secondary | ICD-10-CM | POA: Diagnosis not present

## 2023-03-19 DIAGNOSIS — Z992 Dependence on renal dialysis: Secondary | ICD-10-CM | POA: Diagnosis not present

## 2023-03-21 DIAGNOSIS — Z992 Dependence on renal dialysis: Secondary | ICD-10-CM | POA: Diagnosis not present

## 2023-03-21 DIAGNOSIS — N186 End stage renal disease: Secondary | ICD-10-CM | POA: Diagnosis not present

## 2023-03-21 DIAGNOSIS — N2581 Secondary hyperparathyroidism of renal origin: Secondary | ICD-10-CM | POA: Diagnosis not present

## 2023-03-22 DIAGNOSIS — N186 End stage renal disease: Secondary | ICD-10-CM | POA: Diagnosis not present

## 2023-03-22 DIAGNOSIS — Z992 Dependence on renal dialysis: Secondary | ICD-10-CM | POA: Diagnosis not present

## 2023-03-22 DIAGNOSIS — I129 Hypertensive chronic kidney disease with stage 1 through stage 4 chronic kidney disease, or unspecified chronic kidney disease: Secondary | ICD-10-CM | POA: Diagnosis not present

## 2023-03-24 DIAGNOSIS — N186 End stage renal disease: Secondary | ICD-10-CM | POA: Diagnosis not present

## 2023-03-24 DIAGNOSIS — N2581 Secondary hyperparathyroidism of renal origin: Secondary | ICD-10-CM | POA: Diagnosis not present

## 2023-03-24 DIAGNOSIS — Z992 Dependence on renal dialysis: Secondary | ICD-10-CM | POA: Diagnosis not present

## 2023-03-26 DIAGNOSIS — N186 End stage renal disease: Secondary | ICD-10-CM | POA: Diagnosis not present

## 2023-03-26 DIAGNOSIS — N2581 Secondary hyperparathyroidism of renal origin: Secondary | ICD-10-CM | POA: Diagnosis not present

## 2023-03-26 DIAGNOSIS — Z992 Dependence on renal dialysis: Secondary | ICD-10-CM | POA: Diagnosis not present

## 2023-03-28 ENCOUNTER — Other Ambulatory Visit: Payer: Self-pay | Admitting: Internal Medicine

## 2023-03-29 DIAGNOSIS — N186 End stage renal disease: Secondary | ICD-10-CM | POA: Diagnosis not present

## 2023-03-29 DIAGNOSIS — Z992 Dependence on renal dialysis: Secondary | ICD-10-CM | POA: Diagnosis not present

## 2023-03-29 DIAGNOSIS — N2581 Secondary hyperparathyroidism of renal origin: Secondary | ICD-10-CM | POA: Diagnosis not present

## 2023-03-31 DIAGNOSIS — N186 End stage renal disease: Secondary | ICD-10-CM | POA: Diagnosis not present

## 2023-03-31 DIAGNOSIS — Z992 Dependence on renal dialysis: Secondary | ICD-10-CM | POA: Diagnosis not present

## 2023-03-31 DIAGNOSIS — N2581 Secondary hyperparathyroidism of renal origin: Secondary | ICD-10-CM | POA: Diagnosis not present

## 2023-04-05 DIAGNOSIS — Z992 Dependence on renal dialysis: Secondary | ICD-10-CM | POA: Diagnosis not present

## 2023-04-05 DIAGNOSIS — N186 End stage renal disease: Secondary | ICD-10-CM | POA: Diagnosis not present

## 2023-04-05 DIAGNOSIS — N2581 Secondary hyperparathyroidism of renal origin: Secondary | ICD-10-CM | POA: Diagnosis not present

## 2023-04-07 DIAGNOSIS — N186 End stage renal disease: Secondary | ICD-10-CM | POA: Diagnosis not present

## 2023-04-07 DIAGNOSIS — N2581 Secondary hyperparathyroidism of renal origin: Secondary | ICD-10-CM | POA: Diagnosis not present

## 2023-04-07 DIAGNOSIS — Z992 Dependence on renal dialysis: Secondary | ICD-10-CM | POA: Diagnosis not present

## 2023-04-09 DIAGNOSIS — Z992 Dependence on renal dialysis: Secondary | ICD-10-CM | POA: Diagnosis not present

## 2023-04-09 DIAGNOSIS — N2581 Secondary hyperparathyroidism of renal origin: Secondary | ICD-10-CM | POA: Diagnosis not present

## 2023-04-09 DIAGNOSIS — N186 End stage renal disease: Secondary | ICD-10-CM | POA: Diagnosis not present

## 2023-04-12 DIAGNOSIS — Z992 Dependence on renal dialysis: Secondary | ICD-10-CM | POA: Diagnosis not present

## 2023-04-12 DIAGNOSIS — N186 End stage renal disease: Secondary | ICD-10-CM | POA: Diagnosis not present

## 2023-04-12 DIAGNOSIS — N2581 Secondary hyperparathyroidism of renal origin: Secondary | ICD-10-CM | POA: Diagnosis not present

## 2023-04-14 DIAGNOSIS — N186 End stage renal disease: Secondary | ICD-10-CM | POA: Diagnosis not present

## 2023-04-14 DIAGNOSIS — Z992 Dependence on renal dialysis: Secondary | ICD-10-CM | POA: Diagnosis not present

## 2023-04-14 DIAGNOSIS — N2581 Secondary hyperparathyroidism of renal origin: Secondary | ICD-10-CM | POA: Diagnosis not present

## 2023-04-16 DIAGNOSIS — N2581 Secondary hyperparathyroidism of renal origin: Secondary | ICD-10-CM | POA: Diagnosis not present

## 2023-04-16 DIAGNOSIS — N186 End stage renal disease: Secondary | ICD-10-CM | POA: Diagnosis not present

## 2023-04-16 DIAGNOSIS — Z992 Dependence on renal dialysis: Secondary | ICD-10-CM | POA: Diagnosis not present

## 2023-04-17 ENCOUNTER — Other Ambulatory Visit: Payer: Self-pay | Admitting: Emergency Medicine

## 2023-04-19 DIAGNOSIS — Z992 Dependence on renal dialysis: Secondary | ICD-10-CM | POA: Diagnosis not present

## 2023-04-19 DIAGNOSIS — N2581 Secondary hyperparathyroidism of renal origin: Secondary | ICD-10-CM | POA: Diagnosis not present

## 2023-04-19 DIAGNOSIS — N186 End stage renal disease: Secondary | ICD-10-CM | POA: Diagnosis not present

## 2023-04-21 DIAGNOSIS — N2581 Secondary hyperparathyroidism of renal origin: Secondary | ICD-10-CM | POA: Diagnosis not present

## 2023-04-21 DIAGNOSIS — Z992 Dependence on renal dialysis: Secondary | ICD-10-CM | POA: Diagnosis not present

## 2023-04-21 DIAGNOSIS — N186 End stage renal disease: Secondary | ICD-10-CM | POA: Diagnosis not present

## 2023-04-22 DIAGNOSIS — Z992 Dependence on renal dialysis: Secondary | ICD-10-CM | POA: Diagnosis not present

## 2023-04-22 DIAGNOSIS — I129 Hypertensive chronic kidney disease with stage 1 through stage 4 chronic kidney disease, or unspecified chronic kidney disease: Secondary | ICD-10-CM | POA: Diagnosis not present

## 2023-04-22 DIAGNOSIS — N186 End stage renal disease: Secondary | ICD-10-CM | POA: Diagnosis not present

## 2023-04-23 DIAGNOSIS — N186 End stage renal disease: Secondary | ICD-10-CM | POA: Diagnosis not present

## 2023-04-23 DIAGNOSIS — Z992 Dependence on renal dialysis: Secondary | ICD-10-CM | POA: Diagnosis not present

## 2023-04-23 DIAGNOSIS — N2581 Secondary hyperparathyroidism of renal origin: Secondary | ICD-10-CM | POA: Diagnosis not present

## 2023-04-26 DIAGNOSIS — N2581 Secondary hyperparathyroidism of renal origin: Secondary | ICD-10-CM | POA: Diagnosis not present

## 2023-04-26 DIAGNOSIS — N186 End stage renal disease: Secondary | ICD-10-CM | POA: Diagnosis not present

## 2023-04-26 DIAGNOSIS — Z992 Dependence on renal dialysis: Secondary | ICD-10-CM | POA: Diagnosis not present

## 2023-04-27 ENCOUNTER — Other Ambulatory Visit: Payer: Self-pay | Admitting: Internal Medicine

## 2023-04-28 DIAGNOSIS — N2581 Secondary hyperparathyroidism of renal origin: Secondary | ICD-10-CM | POA: Diagnosis not present

## 2023-04-28 DIAGNOSIS — Z992 Dependence on renal dialysis: Secondary | ICD-10-CM | POA: Diagnosis not present

## 2023-04-28 DIAGNOSIS — N186 End stage renal disease: Secondary | ICD-10-CM | POA: Diagnosis not present

## 2023-04-30 DIAGNOSIS — N186 End stage renal disease: Secondary | ICD-10-CM | POA: Diagnosis not present

## 2023-04-30 DIAGNOSIS — Z992 Dependence on renal dialysis: Secondary | ICD-10-CM | POA: Diagnosis not present

## 2023-04-30 DIAGNOSIS — N2581 Secondary hyperparathyroidism of renal origin: Secondary | ICD-10-CM | POA: Diagnosis not present

## 2023-05-03 DIAGNOSIS — N2581 Secondary hyperparathyroidism of renal origin: Secondary | ICD-10-CM | POA: Diagnosis not present

## 2023-05-03 DIAGNOSIS — Z992 Dependence on renal dialysis: Secondary | ICD-10-CM | POA: Diagnosis not present

## 2023-05-03 DIAGNOSIS — N186 End stage renal disease: Secondary | ICD-10-CM | POA: Diagnosis not present

## 2023-05-05 DIAGNOSIS — N186 End stage renal disease: Secondary | ICD-10-CM | POA: Diagnosis not present

## 2023-05-05 DIAGNOSIS — Z992 Dependence on renal dialysis: Secondary | ICD-10-CM | POA: Diagnosis not present

## 2023-05-05 DIAGNOSIS — N2581 Secondary hyperparathyroidism of renal origin: Secondary | ICD-10-CM | POA: Diagnosis not present

## 2023-05-07 DIAGNOSIS — Z992 Dependence on renal dialysis: Secondary | ICD-10-CM | POA: Diagnosis not present

## 2023-05-07 DIAGNOSIS — N2581 Secondary hyperparathyroidism of renal origin: Secondary | ICD-10-CM | POA: Diagnosis not present

## 2023-05-07 DIAGNOSIS — N186 End stage renal disease: Secondary | ICD-10-CM | POA: Diagnosis not present

## 2023-05-10 ENCOUNTER — Encounter (HOSPITAL_COMMUNITY): Payer: Self-pay | Admitting: Nephrology

## 2023-05-10 DIAGNOSIS — N186 End stage renal disease: Secondary | ICD-10-CM | POA: Diagnosis not present

## 2023-05-10 DIAGNOSIS — N2581 Secondary hyperparathyroidism of renal origin: Secondary | ICD-10-CM | POA: Diagnosis not present

## 2023-05-10 DIAGNOSIS — Z992 Dependence on renal dialysis: Secondary | ICD-10-CM | POA: Diagnosis not present

## 2023-05-11 ENCOUNTER — Encounter (HOSPITAL_COMMUNITY)
Admission: RE | Disposition: A | Discharge status: Home / Self Care | Payer: Self-pay | Source: Ambulatory Visit | Attending: Nephrology

## 2023-05-11 ENCOUNTER — Ambulatory Visit (HOSPITAL_COMMUNITY)
Admission: RE | Admit: 2023-05-11 | Discharge: 2023-05-11 | Disposition: A | Discharge status: Home / Self Care | Payer: Medicare PPO | Source: Ambulatory Visit | Attending: Nephrology | Admitting: Nephrology

## 2023-05-11 ENCOUNTER — Encounter (HOSPITAL_COMMUNITY): Payer: Self-pay | Admitting: Nephrology

## 2023-05-11 ENCOUNTER — Telehealth: Payer: Self-pay | Admitting: Home Health

## 2023-05-11 DIAGNOSIS — N186 End stage renal disease: Secondary | ICD-10-CM | POA: Insufficient documentation

## 2023-05-11 DIAGNOSIS — N250 Renal osteodystrophy: Secondary | ICD-10-CM | POA: Insufficient documentation

## 2023-05-11 DIAGNOSIS — I120 Hypertensive chronic kidney disease with stage 5 chronic kidney disease or end stage renal disease: Secondary | ICD-10-CM | POA: Insufficient documentation

## 2023-05-11 DIAGNOSIS — Z992 Dependence on renal dialysis: Secondary | ICD-10-CM | POA: Insufficient documentation

## 2023-05-11 DIAGNOSIS — Y832 Surgical operation with anastomosis, bypass or graft as the cause of abnormal reaction of the patient, or of later complication, without mention of misadventure at the time of the procedure: Secondary | ICD-10-CM | POA: Insufficient documentation

## 2023-05-11 DIAGNOSIS — T82858A Stenosis of vascular prosthetic devices, implants and grafts, initial encounter: Secondary | ICD-10-CM | POA: Insufficient documentation

## 2023-05-11 DIAGNOSIS — I4891 Unspecified atrial fibrillation: Secondary | ICD-10-CM | POA: Insufficient documentation

## 2023-05-11 DIAGNOSIS — I871 Compression of vein: Secondary | ICD-10-CM | POA: Diagnosis not present

## 2023-05-11 DIAGNOSIS — Z8551 Personal history of malignant neoplasm of bladder: Secondary | ICD-10-CM | POA: Insufficient documentation

## 2023-05-11 DIAGNOSIS — Z87891 Personal history of nicotine dependence: Secondary | ICD-10-CM | POA: Insufficient documentation

## 2023-05-11 HISTORY — PX: A/V FISTULAGRAM: CATH118298

## 2023-05-11 HISTORY — PX: PERIPHERAL VASCULAR BALLOON ANGIOPLASTY: CATH118281

## 2023-05-11 LAB — POCT I-STAT, CHEM 8
BUN: 24 mg/dL — ABNORMAL HIGH (ref 8–23)
Calcium, Ion: 1.32 mmol/L (ref 1.15–1.40)
Chloride: 99 mmol/L (ref 98–111)
Creatinine, Ser: 5.2 mg/dL — ABNORMAL HIGH (ref 0.61–1.24)
Glucose, Bld: 95 mg/dL (ref 70–99)
HCT: 38 % — ABNORMAL LOW (ref 39.0–52.0)
Hemoglobin: 12.9 g/dL — ABNORMAL LOW (ref 13.0–17.0)
Potassium: 3.9 mmol/L (ref 3.5–5.1)
Sodium: 139 mmol/L (ref 135–145)
TCO2: 29 mmol/L (ref 22–32)

## 2023-05-11 SURGERY — A/V FISTULAGRAM
Anesthesia: LOCAL

## 2023-05-11 MED ORDER — FENTANYL CITRATE (PF) 100 MCG/2ML IJ SOLN
INTRAMUSCULAR | Status: AC
Start: 2023-05-11 — End: ?
  Filled 2023-05-11: qty 2

## 2023-05-11 MED ORDER — FENTANYL CITRATE (PF) 100 MCG/2ML IJ SOLN
INTRAMUSCULAR | Status: DC | PRN
  Administered 2023-05-11: 25 ug via INTRAVENOUS

## 2023-05-11 MED ORDER — HEPARIN (PORCINE) IN NACL 1000-0.9 UT/500ML-% IV SOLN
INTRAVENOUS | Status: DC | PRN
  Administered 2023-05-11: 500 mL

## 2023-05-11 MED ORDER — MIDAZOLAM HCL 2 MG/2ML IJ SOLN
INTRAMUSCULAR | Status: DC | PRN
  Administered 2023-05-11: 1 mg via INTRAVENOUS

## 2023-05-11 MED ORDER — MIDAZOLAM HCL 2 MG/2ML IJ SOLN
INTRAMUSCULAR | Status: AC
  Filled 2023-05-11: qty 2

## 2023-05-11 MED ORDER — LIDOCAINE HCL (PF) 1 % IJ SOLN
INTRAMUSCULAR | Status: DC | PRN
  Administered 2023-05-11: 2 mL via SUBCUTANEOUS

## 2023-05-11 MED ORDER — IODIXANOL 320 MG/ML IV SOLN
INTRAVENOUS | Status: DC | PRN
  Administered 2023-05-11: 9 mL via INTRAVENOUS

## 2023-05-11 MED ORDER — LIDOCAINE HCL (PF) 1 % IJ SOLN
INTRAMUSCULAR | Status: AC
  Filled 2023-05-11: qty 30

## 2023-05-11 SURGICAL SUPPLY — 10 items
BAG SNAP BAND KOVER 36X36 (MISCELLANEOUS) ×3 IMPLANT
BALLN MUSTANG 7.0X40 75 (BALLOONS) ×2
BALLOON MUSTANG 7.0X40 75 (BALLOONS) IMPLANT
CATH ANGIO 5F BER2 65CM (CATHETERS) IMPLANT
COVER DOME SNAP 22 D (MISCELLANEOUS) ×3 IMPLANT
GUIDEWIRE ANGLED .035X150CM (WIRE) IMPLANT
SHEATH PINNACLE R/O II 6F 4CM (SHEATH) IMPLANT
SYR MEDALLION 10ML (SYRINGE) IMPLANT
TRAY PV CATH (CUSTOM PROCEDURE TRAY) ×3 IMPLANT
WIRE BENTSON .035X145CM (WIRE) IMPLANT

## 2023-05-11 NOTE — Discharge Instructions (Signed)

## 2023-05-11 NOTE — Telephone Encounter (Signed)
Vascular surgery team Dr Juel Burrow called, patient was here for elective dialysis fistula surgery, post-op A fib RVR 110s, had several weeks of cough and fatigue, symptoms overall stable and unchanged. Advised to vascular team that if patient is not significantly symptomatic from A fib and does not require infectious work up, we can certainly see in our A fib clinic. Dr Juel Burrow felt he is clinically well and felt appropriate for outpatient A fib follow up. Advised use low dose beta blocker for rate control, he is already on Eliquis for known A fib. A fib appt arranged on 05/12/23 at 10am.

## 2023-05-11 NOTE — Op Note (Signed)
Patient presents with decreased access flows of his right BCF (placed July 08, 2021).  On examination, the brachial cephalic fistula is hyperpulsatile with an aneurysmal body and a poor thrill in the outflow. Augmentation is normal. No signs of impending rupture were seen over the aneurysm.   Summary:  1)      The patient had successful angioplasty (7x4 Mustang FE ~15 atm) of significant 80% stenosis in the cephalic vein arch.  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 3)      The centrals and inflow were widely patent. 4)      This right BCF remains amenable to future percutaneous intervention.  Description of procedure: The arm was prepped and draped in the usual sterile fashion. The right upper arm brachial cephalic fistula was cannulated (53664) with an 18G Angiocath needle directed in an antegrade direction. A guidewire was inserted and exchanged for a 6 Fr sheath. Contrast [id number]) injection via the side port of the sheath was performed. The angiogram of the fistula (42595) showed an aneurysmal body of the right BCF, patent outflow cephalic vein and an 80% cephalic vein arch stenosis not involving the confluence. The inflow anastomosis and right centrals were patent.  The wire was advanced centrally without any difficulty. An 20x4 Mustang angioplasty balloon was inserted over a glide wire and positioned at the cephalic vein arch stenosis. Venous angioplasty (63875) was carried out to 15 ATM with FULL effacement of the waist on the balloon at the arch lesion.  Repeat angiogram showed 10% residual at the site of angioplasty with no evidence of extravasation.  The flow of contrast was quicker and the fistula was markedly less pulsatile.  Of note patient was in atrial fibrillation with RVR heart rate in the 110-140 range.  Had a nonproductive cough, he has been feeling about the same with malaise and fatigue for the past 3  weeks not any worse.  Cardiology was consulted and will discuss with the patient whether he wants to wait for the consultation.  He is last seen by cardiology extender over a year ago.  Appears that he was lost to follow-up, not on any beta-blocker, amiodarone and only on Eliquis.  Hemostasis: A 3-0 ethilon purse string suture was placed at the cannulation site on removal of the sheath.  Sedation: 1 mg Versed, 25 mcg Fentanyl. Sedation time. 14 minutes  Contrast. 9 mL  Monitoring: Because of the patient's comorbid conditions and sedation during the procedure, continuous EKG monitoring and O2 saturation monitoring was performed throughout the procedure by the RN. There were no abnormal arrhythmias encountered.  Complications: None.   Diagnoses: I87.1 Stricture of vein  N18.6 ESRD T82.858A Stricture of access  Procedure Coding:  [phone] Cannulation and angiogram of fistula, venous angioplasty (cephalic vein arch) R5188 Contrast  Recommendations:  1. Continue to cannulate the fistula with 15G needles.  2. Refer back for problems with flows. 3. Remove the suture next treatment.   Discharge: The patient was discharged home in stable condition. The patient was given education regarding the care of the dialysis access AVF and specific instructions in case of any problems.

## 2023-05-11 NOTE — H&P (Addendum)
Chief Complaint: Decreased flows  Interval H&P  The patient has presented today for an angiogram/ angioplasty.  Various methods of treatment have been discussed with the patient.  After consideration of risk, benefits and other options for treatment, the patient has consented to a angiogram/ angioplasty with  possible stent placement.   Risks of angiogram with potential angioplasty and stenting if needed.contrast reaction, extravasation/ bleeding, dissection, hypotension and death were explained to the patient.  The patient's history has been reviewed and the patient has been examined, no changes in status.  Stable for angiogram/angioplasty  I have reviewed the patient's chart and labs.  Questions were answered to the patient's satisfaction.  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  Decreased access flows in rt BCF placed 07/08/21 by Dr. Myra Gianotti  - planning on angiogram with possibly angioplasty. Renal osteodystrophy - continue binders w/ outpt management. History of transitional cell carcinoma of bladder: Noted in 2007, s/p surgery without neoadjuvant chemotherapy.  Atrial fibrillation with RVR Hypertension: Resume outpatient regimen.   HPI: Christopher [name] is an 73 y.o. male with a history of atrial fibrillation, hypertension, bladder cancer status post surgery, end-stage renal disease dialyzed at [facility] Tuesday Thursday and Saturdays with last treatment on Tuesday.  Pt denies fever, chills, nausea, vomiting, myalgias, SOB, CP.   ROS Per HPI.  Chemistry and CBC: Creat  Date/Time Value Ref Range Status  06/25/2015 05:56 PM 1.84 (H) 0.70 - 1.25 mg/dL Final  14/78/2956 21:30 PM 1.96 (H) 0.70 - 1.25 mg/dL Final  86/57/8469 62:95 AM 1.51 (H) 0.50 - 1.35 mg/dL Final  28/41/3244 01:02 PM 1.32 0.50 - 1.35 mg/dL Final  72/53/6644 03:47 AM 1.52 (H) 0.50 - 1.35 mg/dL Final   Creatinine, Ser  Date/Time  Value Ref Range Status  05/11/2023 07:33 AM 5.20 (H) 0.61 - 1.24 mg/dL Final  42/59/5638 75:64 AM 5.73 (H) 0.61 - 1.24 mg/dL Final  33/29/5188 41:66 AM 4.29 (H) 0.61 - 1.24 mg/dL Final  09/19/1599 09:32 AM 4.69 (H) 0.61 - 1.24 mg/dL Final    Comment:    DIALYSIS  07/08/2021 02:23 AM 7.54 (H) 0.61 - 1.24 mg/dL Final  35/57/3220 25:42 AM 7.28 (H) 0.61 - 1.24 mg/dL Final  70/62/3762 83:15 AM 7.31 (H) 0.61 - 1.24 mg/dL Final  17/61/6073 71:06 PM 7.25 (H) 0.61 - 1.24 mg/dL Final  26/94/8546 27:03 AM 7.01 (HH) 0.40 - 1.50 mg/dL Final  50/11/3816 29:93 AM 2.75 (H) 0.40 - 1.50 mg/dL Final  71/69/6789 38:10 AM 2.38 (H) 0.76 - 1.27 mg/dL Final  17/51/0258 52:77 PM 1.6 (H) 0.4 - 1.5 mg/dL Final   Recent Labs  Lab 05/11/23 0733  NA 139  K 3.9  CL 99  GLUCOSE 95  BUN 24*  CREATININE 5.20*   Recent Labs  Lab 05/11/23 0733  HGB 12.9*  HCT 38.0*   Liver Function Tests: No results for input(s): "AST", "ALT", "ALKPHOS", "BILITOT", "PROT", "ALBUMIN" in the last 168 hours. No results for input(s): "LIPASE", "AMYLASE" in the last 168 hours. No results for input(s): "AMMONIA" in the last 168 hours. Cardiac Enzymes: No results for input(s): "CKTOTAL", "CKMB", "CKMBINDEX", "TROPONINI" in the last 168 hours. Iron Studies: No results for input(s): "IRON", "TIBC", "TRANSFERRIN", "FERRITIN" in the last 72 hours. PT/INR: @LABRCNTIP (inr:5)  Xrays/Other Studies: ) Results for orders placed or performed during the hospital encounter of 05/11/23 (from the past 48 hours)  I-STAT, chem 8     Status: Abnormal  Collection Time: 05/11/23  7:33 AM  Result Value Ref Range   Sodium 139 135 - 145 mmol/L   Potassium 3.9 3.5 - 5.1 mmol/L   Chloride 99 98 - 111 mmol/L   BUN 24 (H) 8 - 23 mg/dL   Creatinine, Ser 1.61 (H) 0.61 - 1.24 mg/dL   Glucose, Bld 95 70 - 99 mg/dL    Comment: Glucose reference range applies only to samples taken after fasting for at least 8 hours.   Calcium, Ion 1.32 1.15 - 1.40  mmol/L   TCO2 29 22 - 32 mmol/L   Hemoglobin 12.9 (L) 13.0 - 17.0 g/dL   HCT 09.6 (L) 04.5 - 40.9 %   No results found.  PMH:   Past Medical History:  Diagnosis Date   Anxiety    Cancer (HCC)    bladder  cancer- stage 1- removed 2007   Chronic kidney disease    CKD    Depression    Hypertension     PSH:   Past Surgical History:  Procedure Laterality Date   AV FISTULA PLACEMENT Right 07/08/2021   Procedure: RIGHT ARM FISTULA  CREATION;  Surgeon: Nada Libman, MD;  Location: [facility] OR;  Service: Vascular;  Laterality: Right;   BIOPSY  07/11/2021   Procedure: BIOPSY;  Surgeon: Tressia Danas, MD;  Location: [facility] ENDOSCOPY;  Service: Gastroenterology;;   BLADDER SURGERY     COLONOSCOPY     ESOPHAGOGASTRODUODENOSCOPY (EGD) WITH PROPOFOL N/A 07/11/2021   Procedure: ESOPHAGOGASTRODUODENOSCOPY (EGD) WITH PROPOFOL;  Surgeon: Tressia Danas, MD;  Location: [facility] ENDOSCOPY;  Service: Gastroenterology;  Laterality: N/A;   INSERTION OF DIALYSIS CATHETER N/A 07/08/2021   Procedure: INSERTION OF DIALYSIS CATHETER;  Surgeon: Nada Libman, MD;  Location: [facility] OR;  Service: Vascular;  Laterality: N/A;   JOINT REPLACEMENT  2016   knee right    POLYPECTOMY      Allergies: No Known Allergies  Medications:   Prior to Admission medications   Medication Sig Start Date End Date Taking? Authorizing Provider  acetaminophen (TYLENOL) 500 MG tablet Take 1,000 mg by mouth every 6 (six) hours as needed for mild pain (pain score 1-3) or moderate pain (pain score 4-6).   Yes [provider]  ELIQUIS 5 MG TABS tablet TAKE 1 TABLET(5 MG) BY MOUTH TWICE DAILY 04/17/23  Yes Sagardia, Eilleen Kempf, MD  Multiple Vitamin (MULTIVITAMIN) tablet Take 1 tablet by mouth daily. Centem   Yes [provider]  Omega-3 Fatty Acids (FISH OIL PO) Take 500 mg by mouth.   Yes [provider]  pantoprazole (PROTONIX) 40 MG tablet TAKE 1 TABLET(40 MG) BY MOUTH DAILY 02/07/23  Yes Sagardia, Eilleen Kempf, MD  PARoxetine (PAXIL) 40 MG tablet TAKE 1 TABLET BY MOUTH EVERY DAY 04/29/23  Yes Plotnikov, Georgina Quint, MD  tamsulosin (FLOMAX) 0.4 MG CAPS capsule Take 0.4 mg by mouth daily. 04/28/21  Yes [provider]    Discontinued Meds:   Medications Discontinued During This Encounter  Medication Reason   metoprolol succinate (TOPROL XL) 25 MG 24 hr tablet Completed Course   furosemide (LASIX) 40 MG tablet Patient Preference    Social History:  reports that he quit smoking about 51 years ago. His smoking use included cigarettes. He has never used smokeless tobacco. He reports current alcohol use. He reports that he does not use drugs.  Family History:   Family History  Problem Relation Age of Onset   Cancer Mother    Cancer Father  Hypertension Father    Heart disease Father    Stomach cancer Father    Cancer Sister    Diabetes Sister    Heart disease Sister    Hypertension Sister    Mental illness Brother    Colon cancer Neg Hx    Colon polyps Neg Hx    Esophageal cancer Neg Hx    Rectal cancer Neg Hx     Blood pressure 125/86, pulse 65, resp. rate 14, height 5\' 9"  (1.753 m), weight 86.2 kg, SpO2 94%. GEN: NAD, A&Ox3, NCAT HEENT: No conjunctival pallor, EOMI NECK: Supple, no thyromegaly LUNGS: CTA B/L no rales, rhonchi or wheezing CV: RRR, No M/R/G ABD: SNDNT +BS  EXT: tr lower extremity edema ACCESS: rt BCF hyperpulsatile       Ninfa Giannelli, Len Blalock, MD 05/11/2023, 7:59 AM

## 2023-05-12 ENCOUNTER — Ambulatory Visit (HOSPITAL_COMMUNITY): Payer: Medicare PPO | Admitting: Internal Medicine

## 2023-05-13 ENCOUNTER — Other Ambulatory Visit (HOSPITAL_COMMUNITY): Payer: Self-pay

## 2023-05-13 ENCOUNTER — Ambulatory Visit (HOSPITAL_COMMUNITY)
Admission: RE | Admit: 2023-05-13 | Discharge: 2023-05-13 | Disposition: A | Discharge status: Home / Self Care | Payer: Medicare PPO | Source: Ambulatory Visit | Attending: Internal Medicine | Admitting: Internal Medicine

## 2023-05-13 ENCOUNTER — Other Ambulatory Visit: Payer: Self-pay

## 2023-05-13 VITALS — BP 122/90 | HR 121 | Ht 69.0 in | Wt 178.6 lb

## 2023-05-13 DIAGNOSIS — I1311 Hypertensive heart and chronic kidney disease without heart failure, with stage 5 chronic kidney disease, or end stage renal disease: Secondary | ICD-10-CM | POA: Insufficient documentation

## 2023-05-13 DIAGNOSIS — N186 End stage renal disease: Secondary | ICD-10-CM | POA: Insufficient documentation

## 2023-05-13 DIAGNOSIS — Z7901 Long term (current) use of anticoagulants: Secondary | ICD-10-CM | POA: Insufficient documentation

## 2023-05-13 DIAGNOSIS — Z992 Dependence on renal dialysis: Secondary | ICD-10-CM | POA: Diagnosis not present

## 2023-05-13 DIAGNOSIS — D6869 Other thrombophilia: Secondary | ICD-10-CM | POA: Diagnosis not present

## 2023-05-13 DIAGNOSIS — I480 Paroxysmal atrial fibrillation: Secondary | ICD-10-CM | POA: Insufficient documentation

## 2023-05-13 DIAGNOSIS — Z8551 Personal history of malignant neoplasm of bladder: Secondary | ICD-10-CM | POA: Insufficient documentation

## 2023-05-13 DIAGNOSIS — I4819 Other persistent atrial fibrillation: Secondary | ICD-10-CM | POA: Insufficient documentation

## 2023-05-13 DIAGNOSIS — Z79899 Other long term (current) drug therapy: Secondary | ICD-10-CM | POA: Insufficient documentation

## 2023-05-13 MED ORDER — METOPROLOL SUCCINATE ER 25 MG PO TB24
25.0000 mg | ORAL_TABLET | Freq: Every morning | ORAL | 3 refills | Status: DC
Start: 2023-05-13 — End: 2023-05-23

## 2023-05-13 MED ORDER — METOPROLOL SUCCINATE ER 25 MG PO TB24: ORAL_TABLET | ORAL | 3 refills | Status: DC

## 2023-05-13 NOTE — Addendum Note (Signed)
Addended by: Hyman Bower F on: 05/13/2023 02:31 PM   Modules accepted: Orders

## 2023-05-13 NOTE — Progress Notes (Signed)
Primary Care Physician: Georgina Quint, MD Primary Cardiologist: Maisie Fus, MD Electrophysiologist: None     Referring Physician: Dr. Erskine Emery Christopher [name] is a 73 y.o. male with a history of HTN, ESRD on HD, history of bladder cancer, and parxosymal atrial fibrillation who presents for consultation in the [facility]. S/p fistula angiogram by Dr. Juel Burrow noted to be in Afib with RVR; HR 110-140s. Last seen by Cardiology 10/2021 and were pursuing rate control strategy with metoprolol. Patient is on Eliquis 5 mg BID for a CHADS2VASC score of 2.  On evaluation today, he is currently in Afib with RVR. He cannot feel arrhythmia. He notes to feel tired. No missed doses of Eliquis 5 mg BID. He does not know if he is taking metoprolol 25 mg daily which is on medication list.   Today, he denies symptoms of palpitations, chest pain, shortness of breath, orthopnea, PND, lower extremity edema, dizziness, presyncope, syncope, snoring, daytime somnolence, bleeding, or neurologic sequela. The patient is tolerating medications without difficulties and is otherwise without complaint today.    he has a BMI of Body mass index is 26.37 kg/m.Marland Kitchen Filed Weights   05/13/23 0933  Weight: 81 kg    Current Outpatient Medications  Medication Sig Dispense Refill   acetaminophen (TYLENOL) 500 MG tablet Take 1,000 mg by mouth as needed for mild pain (pain score 1-3) or moderate pain (pain score 4-6).     ELIQUIS 5 MG TABS tablet TAKE 1 TABLET(5 MG) BY MOUTH TWICE DAILY 60 tablet 1   metoprolol succinate (TOPROL-XL) 25 MG 24 hr tablet Take 25 mg by mouth every morning.     Multiple Vitamin (MULTIVITAMIN) tablet Take 1 tablet by mouth daily. Centem     Multiple Vitamins-Minerals (PX COMPLETE SENIOR MULTIVITS) TABS Take 1 tablet by mouth every morning.     Omega-3 Fatty Acids (FISH OIL PO) Take 500 mg by mouth.     pantoprazole (PROTONIX) 40 MG tablet TAKE 1 TABLET(40 MG) BY  MOUTH DAILY 90 tablet 1   PARoxetine (PAXIL) 40 MG tablet TAKE 1 TABLET BY MOUTH EVERY DAY 30 tablet 1   tamsulosin (FLOMAX) 0.4 MG CAPS capsule Take 0.4 mg by mouth daily.     Current Facility-Administered Medications  Medication Dose Route Frequency Provider Last Rate Last Admin   0.9 %  sodium chloride infusion  500 mL Intravenous Once Hilarie Fredrickson, MD       0.9 %  sodium chloride infusion  500 mL Intravenous Continuous Hilarie Fredrickson, MD        Atrial Fibrillation Management history:  Previous antiarrhythmic drugs: none Previous cardioversions: none Previous ablations: none Anticoagulation history: Eliquis   ROS- All systems are reviewed and negative except as per the HPI above.  Physical Exam: BP (!) 122/90   Pulse (!) 121   Ht 5\' 9"  (1.753 m)   Wt 81 kg   BMI 26.37 kg/m   GEN: Well nourished, well developed in no acute distress NECK: No JVD; No carotid bruits CARDIAC: Irregularly irregular tachycardic rate and rhythm, no murmurs, rubs, gallops RESPIRATORY:  Clear to auscultation without rales, wheezing or rhonchi  ABDOMEN: Soft, non-tender, non-distended EXTREMITIES:  No edema; No deformity   EKG today demonstrates  Vent. rate 121 BPM PR interval * ms QRS duration 84 ms QT/QTcB 332/471 ms P-R-T axes * 100 66 Atrial fibrillation with rapid ventricular response Rightward axis Abnormal ECG When compared with  ECG of 06-Jul-2021 22:31, PREVIOUS ECG IS PRESENT  Echo 07/07/21 demonstrated   1. Left ventricular ejection fraction, by estimation, is 60 to 65%. The  left ventricle has normal function. The left ventricle has no regional  wall motion abnormalities. Left ventricular diastolic parameters are  consistent with Grade I diastolic  dysfunction (impaired relaxation).   2. Right ventricular systolic function is normal. The right ventricular  size is normal.   3. Left atrial size was mild to moderately dilated.   4. The mitral valve is grossly normal. Mild  mitral valve regurgitation.  No evidence of mitral stenosis.   5. The aortic valve is tricuspid. Aortic valve regurgitation is mild. No  aortic stenosis is present.    ASSESSMENT & PLAN CHA2DS2-VASc Score = 2  The patient's score is based upon: CHF History: 0 HTN History: 1 Diabetes History: 0 Stroke History: 0 Vascular Disease History: 0 Age Score: 1 Gender Score: 0       ASSESSMENT AND PLAN: Paroxysmal Atrial Fibrillation (ICD10:  I48.0) The patient's CHA2DS2-VASc score is 2, indicating a 2.2% annual risk of stroke.    He is currently in Afib with RVR. We discussed rate control which I am not sure if he is taking Toprol 25 mg daily on medication list. He will call today when he gets home to confirm medication regimen. If he is not on metoprolol, will prescribe Toprol 25 mg daily. If he is taking Toprol 25 mg daily, will increase to 37.5 mg daily and have him follow up in 1 week for reassessment. If he is paroxysmal, then based on history of ESRD his primary mode for rhythm control would be amiodarone.   Secondary Hypercoagulable State (ICD10:  D68.69) The patient is at significant risk for stroke/thromboembolism based upon his CHA2DS2-VASc Score of 2.  Continue Apixaban (Eliquis).  No missed doses. Continue without interruption.    Follow up 1 week for reassessment.    [name], PA-C  Afib Clinic [facility] [address] [place], [place] [address] [phone]

## 2023-05-13 NOTE — Patient Instructions (Signed)
Call regarding metoprolol - [phone]

## 2023-05-14 DIAGNOSIS — N2581 Secondary hyperparathyroidism of renal origin: Secondary | ICD-10-CM | POA: Diagnosis not present

## 2023-05-14 DIAGNOSIS — N186 End stage renal disease: Secondary | ICD-10-CM | POA: Diagnosis not present

## 2023-05-14 DIAGNOSIS — Z992 Dependence on renal dialysis: Secondary | ICD-10-CM | POA: Diagnosis not present

## 2023-05-16 ENCOUNTER — Telehealth: Payer: Self-pay | Admitting: *Deleted

## 2023-05-16 NOTE — Progress Notes (Signed)
 Complex Care Management Note  Care Guide Note 05/16/2023 Name: Christopher [name] MRN: [id number] DOB: 1950/07/04  Christopher [name] is a 73 y.o. year old male who sees Sagardia, Eilleen Kempf, MD for primary care. I reached out to Lianne Moris by phone today to offer complex care management services.  [name] was given information about Complex Care Management services today including:   The Complex Care Management services include support from the care team which includes your Nurse Care Manager, Clinical Social Worker, or Pharmacist.  The Complex Care Management team is here to help remove barriers to the health concerns and goals most important to you. Complex Care Management services are voluntary, and the patient may decline or stop services at any time by request to their care team member.   Complex Care Management Consent Status: Patient did not agree to participate in complex care management services at this time.  Encounter Outcome:  Patient Refused  Burman Nieves, CMA, Care Guide [facility]  [facility], [facility] Guide Direct Dial: [phone]  Fax: [phone] Website: [email or web address]

## 2023-05-17 ENCOUNTER — Other Ambulatory Visit: Payer: Self-pay | Admitting: Emergency Medicine

## 2023-05-17 DIAGNOSIS — Z992 Dependence on renal dialysis: Secondary | ICD-10-CM | POA: Diagnosis not present

## 2023-05-17 DIAGNOSIS — N2581 Secondary hyperparathyroidism of renal origin: Secondary | ICD-10-CM | POA: Diagnosis not present

## 2023-05-17 DIAGNOSIS — R12 Heartburn: Secondary | ICD-10-CM

## 2023-05-17 DIAGNOSIS — N186 End stage renal disease: Secondary | ICD-10-CM | POA: Diagnosis not present

## 2023-05-19 DIAGNOSIS — N2581 Secondary hyperparathyroidism of renal origin: Secondary | ICD-10-CM | POA: Diagnosis not present

## 2023-05-19 DIAGNOSIS — Z992 Dependence on renal dialysis: Secondary | ICD-10-CM | POA: Diagnosis not present

## 2023-05-19 DIAGNOSIS — N186 End stage renal disease: Secondary | ICD-10-CM | POA: Diagnosis not present

## 2023-05-20 DIAGNOSIS — I129 Hypertensive chronic kidney disease with stage 1 through stage 4 chronic kidney disease, or unspecified chronic kidney disease: Secondary | ICD-10-CM | POA: Diagnosis not present

## 2023-05-20 DIAGNOSIS — Z992 Dependence on renal dialysis: Secondary | ICD-10-CM | POA: Diagnosis not present

## 2023-05-20 DIAGNOSIS — N186 End stage renal disease: Secondary | ICD-10-CM | POA: Diagnosis not present

## 2023-05-21 DIAGNOSIS — Z992 Dependence on renal dialysis: Secondary | ICD-10-CM | POA: Diagnosis not present

## 2023-05-21 DIAGNOSIS — N186 End stage renal disease: Secondary | ICD-10-CM | POA: Diagnosis not present

## 2023-05-21 DIAGNOSIS — N2581 Secondary hyperparathyroidism of renal origin: Secondary | ICD-10-CM | POA: Diagnosis not present

## 2023-05-23 ENCOUNTER — Ambulatory Visit (HOSPITAL_COMMUNITY)
Admission: RE | Admit: 2023-05-23 | Discharge: 2023-05-23 | Disposition: A | Discharge status: Home / Self Care | Payer: Medicare PPO | Source: Ambulatory Visit | Attending: Internal Medicine | Admitting: Internal Medicine

## 2023-05-23 VITALS — BP 88/68 | HR 81 | Ht 69.0 in | Wt 178.0 lb

## 2023-05-23 DIAGNOSIS — I120 Hypertensive chronic kidney disease with stage 5 chronic kidney disease or end stage renal disease: Secondary | ICD-10-CM | POA: Insufficient documentation

## 2023-05-23 DIAGNOSIS — I480 Paroxysmal atrial fibrillation: Secondary | ICD-10-CM | POA: Diagnosis present

## 2023-05-23 DIAGNOSIS — Z992 Dependence on renal dialysis: Secondary | ICD-10-CM | POA: Diagnosis not present

## 2023-05-23 DIAGNOSIS — I4819 Other persistent atrial fibrillation: Secondary | ICD-10-CM | POA: Diagnosis not present

## 2023-05-23 DIAGNOSIS — D6869 Other thrombophilia: Secondary | ICD-10-CM | POA: Diagnosis not present

## 2023-05-23 DIAGNOSIS — N186 End stage renal disease: Secondary | ICD-10-CM | POA: Diagnosis not present

## 2023-05-23 DIAGNOSIS — Z7901 Long term (current) use of anticoagulants: Secondary | ICD-10-CM | POA: Insufficient documentation

## 2023-05-23 LAB — CBC
HCT: 38.8 % — ABNORMAL LOW (ref 39.0–52.0)
Hemoglobin: 12.2 g/dL — ABNORMAL LOW (ref 13.0–17.0)
MCH: 28.5 pg (ref 26.0–34.0)
MCHC: 31.4 g/dL (ref 30.0–36.0)
MCV: 90.7 fL (ref 80.0–100.0)
Platelets: 277 10*3/uL (ref 150–400)
RBC: 4.28 MIL/uL (ref 4.22–5.81)
RDW: 17.4 % — ABNORMAL HIGH (ref 11.5–15.5)
WBC: 8.2 10*3/uL (ref 4.0–10.5)
nRBC: 0 % (ref 0.0–0.2)

## 2023-05-23 LAB — BASIC METABOLIC PANEL
Anion gap: 12 (ref 5–15)
BUN: 26 mg/dL — ABNORMAL HIGH (ref 8–23)
CO2: 31 mmol/L (ref 22–32)
Calcium: 10.3 mg/dL (ref 8.9–10.3)
Chloride: 98 mmol/L (ref 98–111)
Creatinine, Ser: 5.64 mg/dL — ABNORMAL HIGH (ref 0.61–1.24)
GFR, Estimated: 10 mL/min — ABNORMAL LOW (ref >60)
Glucose, Bld: 111 mg/dL — ABNORMAL HIGH (ref 70–99)
Potassium: 4.3 mmol/L (ref 3.5–5.1)
Sodium: 141 mmol/L (ref 135–145)

## 2023-05-23 MED ORDER — METOPROLOL SUCCINATE ER 25 MG PO TB24: 12.5000 mg | ORAL_TABLET | Freq: Every morning | ORAL | 3 refills | Status: DC

## 2023-05-23 NOTE — Patient Instructions (Signed)
 Decrease metoprolol to 1/2 tablet once a day    Cardioversion scheduled for: Friday, March 7th   - Arrive at the [facility] and go to admitting at 6:30am   - Do not eat or drink anything after midnight the night prior to your procedure.   - Take all your morning medication (except diabetic medications) with a sip of water prior to arrival.  - You will not be able to drive home after your procedure.    - Do NOT miss any doses of your blood thinner - if you should miss a dose please notify our office immediately.   - If you feel as if you go back into normal rhythm prior to scheduled cardioversion, please notify our office immediately.   If your procedure is canceled in the cardioversion suite you will be charged a cancellation fee.

## 2023-05-23 NOTE — Progress Notes (Signed)
 Primary Care Physician: Georgina Quint, MD Primary Cardiologist: Maisie Fus, MD Electrophysiologist: None     Referring Physician: Dr. Erskine Emery Shiven Christopher [name] is a 73 y.o. male with a history of HTN, ESRD on HD, history of bladder cancer, and parxosymal atrial fibrillation who presents for consultation in the [facility]. S/p fistula angiogram by Dr. Juel Burrow noted to be in Afib with RVR; HR 110-140s. Last seen by Cardiology 10/2021 and were pursuing rate control strategy with metoprolol. Patient is on Eliquis 5 mg BID for a CHADS2VASC score of 2.  On evaluation today, he is currently in Afib with RVR. He cannot feel arrhythmia. He notes to feel tired. No missed doses of Eliquis 5 mg BID. He does not know if he is taking metoprolol 25 mg daily which is on medication list.   On follow up 05/23/23, he is currently in Afib. He is taking Toprol 25 mg daily but notes low BP and feeling tired. No missed doses of Eliquis.    Today, he denies symptoms of palpitations, chest pain, shortness of breath, orthopnea, PND, lower extremity edema, dizziness, presyncope, syncope, snoring, daytime somnolence, bleeding, or neurologic sequela. The patient is tolerating medications without difficulties and is otherwise without complaint today.    he has a BMI of Body mass index is 26.29 kg/m.Marland Kitchen Filed Weights   05/23/23 0828  Weight: 80.7 kg     Current Outpatient Medications  Medication Sig Dispense Refill   acetaminophen (TYLENOL) 500 MG tablet Take 1,000 mg by mouth as needed for mild pain (pain score 1-3) or moderate pain (pain score 4-6).     ELIQUIS 5 MG TABS tablet TAKE 1 TABLET(5 MG) BY MOUTH TWICE DAILY 60 tablet 1   Multiple Vitamin (MULTIVITAMIN) tablet Take 1 tablet by mouth daily. Centem     Omega-3 Fatty Acids (FISH OIL PO) Take 500 mg by mouth every morning.     pantoprazole (PROTONIX) 40 MG tablet TAKE 1 TABLET(40 MG) BY MOUTH DAILY 90 tablet 1    PARoxetine (PAXIL) 40 MG tablet TAKE 1 TABLET BY MOUTH EVERY DAY 30 tablet 1   tamsulosin (FLOMAX) 0.4 MG CAPS capsule Take 0.4 mg by mouth daily.     metoprolol succinate (TOPROL-XL) 25 MG 24 hr tablet Take 0.5 tablets (12.5 mg total) by mouth every morning. 90 tablet 3   Current Facility-Administered Medications  Medication Dose Route Frequency Provider Last Rate Last Admin   0.9 %  sodium chloride infusion  500 mL Intravenous Once Hilarie Fredrickson, MD       0.9 %  sodium chloride infusion  500 mL Intravenous Continuous Hilarie Fredrickson, MD        Atrial Fibrillation Management history:  Previous antiarrhythmic drugs: none Previous cardioversions: none Previous ablations: none Anticoagulation history: Eliquis   ROS- All systems are reviewed and negative except as per the HPI above.  Physical Exam: BP (!) 88/68   Pulse 81   Ht 5\' 9"  (1.753 m)   Wt 80.7 kg   BMI 26.29 kg/m   GEN- The patient is well appearing, alert and oriented x 3 today.   Neck - no JVD or carotid bruit noted Lungs- Clear to ausculation bilaterally, normal work of breathing Heart- Irregular rate and rhythm, no murmurs, rubs or gallops, PMI not laterally displaced Extremities- no clubbing, cyanosis, or edema Skin - no rash or ecchymosis noted   EKG today demonstrates  Vent. rate 81  BPM PR interval * ms QRS duration 96 ms QT/QTcB 380/441 ms P-R-T axes * 58 99 Atrial fibrillation with premature ventricular or aberrantly conducted complexes Nonspecific T wave abnormality Abnormal ECG When compared with ECG of 13-May-2023 09:42, PREVIOUS ECG IS PRESENT  Echo 07/07/21 demonstrated   1. Left ventricular ejection fraction, by estimation, is 60 to 65%. The  left ventricle has normal function. The left ventricle has no regional  wall motion abnormalities. Left ventricular diastolic parameters are  consistent with Grade I diastolic  dysfunction (impaired relaxation).   2. Right ventricular systolic function is  normal. The right ventricular  size is normal.   3. Left atrial size was mild to moderately dilated.   4. The mitral valve is grossly normal. Mild mitral valve regurgitation.  No evidence of mitral stenosis.   5. The aortic valve is tricuspid. Aortic valve regurgitation is mild. No  aortic stenosis is present.    ASSESSMENT & PLAN CHA2DS2-VASc Score = 2  The patient's score is based upon: CHF History: 0 HTN History: 1 Diabetes History: 0 Stroke History: 0 Vascular Disease History: 0 Age Score: 1 Gender Score: 0       ASSESSMENT AND PLAN: Persistent Atrial Fibrillation (ICD10:  I48.0) The patient's CHA2DS2-VASc score is 2, indicating a 2.2% annual risk of stroke.    He is currently in Afib. He is taking Toprol 25 mg daily but appears to be hypotensive and feels tired from this. Will decrease dosage to 12.5 mg daily. We discussed since he is still in Afib the procedure called cardioversion to try to convert to NSR. After discussion, patient wishes to proceed with cardioversion. Labs drawn today.   Informed Consent   Shared Decision Making/Informed Consent The risks (stroke, cardiac arrhythmias rarely resulting in the need for a temporary or permanent pacemaker, skin irritation or burns and complications associated with conscious sedation including aspiration, arrhythmia, respiratory failure and death), benefits (restoration of normal sinus rhythm) and alternatives of a direct current cardioversion were explained in detail to Christopher [name] and he agrees to proceed.       Based on history of ESRD, going forward his primary mode for rhythm control would be amiodarone.   Secondary Hypercoagulable State (ICD10:  D68.69) The patient is at significant risk for stroke/thromboembolism based upon his CHA2DS2-VASc Score of 2.  Continue Apixaban (Eliquis).  Continue without interruption.    Follow up 2 weeks after DCCV.   [name], PA-C  Afib Clinic [facility] [address] [place], [place] [address] [phone]

## 2023-05-26 DIAGNOSIS — N2581 Secondary hyperparathyroidism of renal origin: Secondary | ICD-10-CM | POA: Diagnosis not present

## 2023-05-26 DIAGNOSIS — Z992 Dependence on renal dialysis: Secondary | ICD-10-CM | POA: Diagnosis not present

## 2023-05-26 DIAGNOSIS — N186 End stage renal disease: Secondary | ICD-10-CM | POA: Diagnosis not present

## 2023-05-26 NOTE — Progress Notes (Signed)
 Spoke to pt and instructed them to come at 0630 and to be NPO after 0000.  Confirmed no missed doses of AC and instructed to take in AM with a small sip of water.  Confirmed that pt will have a ride home and someone to stay with them for 24 hours after the procedure. Instructed patient to not wear any jewelry or lotion.

## 2023-05-27 ENCOUNTER — Ambulatory Visit (HOSPITAL_COMMUNITY): Admitting: Anesthesiology

## 2023-05-27 ENCOUNTER — Ambulatory Visit (HOSPITAL_COMMUNITY)
Admission: RE | Admit: 2023-05-27 | Discharge: 2023-05-27 | Disposition: A | Discharge status: Home / Self Care | Source: Ambulatory Visit | Attending: Cardiology | Admitting: Cardiology

## 2023-05-27 ENCOUNTER — Other Ambulatory Visit: Payer: Self-pay

## 2023-05-27 ENCOUNTER — Encounter (HOSPITAL_COMMUNITY): Payer: Self-pay | Admitting: Cardiology

## 2023-05-27 ENCOUNTER — Encounter (HOSPITAL_COMMUNITY)
Admission: RE | Disposition: A | Discharge status: Home / Self Care | Payer: Self-pay | Source: Ambulatory Visit | Attending: Cardiology

## 2023-05-27 DIAGNOSIS — Z992 Dependence on renal dialysis: Secondary | ICD-10-CM | POA: Diagnosis not present

## 2023-05-27 DIAGNOSIS — N186 End stage renal disease: Secondary | ICD-10-CM | POA: Insufficient documentation

## 2023-05-27 DIAGNOSIS — I4891 Unspecified atrial fibrillation: Secondary | ICD-10-CM

## 2023-05-27 DIAGNOSIS — I4819 Other persistent atrial fibrillation: Secondary | ICD-10-CM | POA: Insufficient documentation

## 2023-05-27 DIAGNOSIS — Z79899 Other long term (current) drug therapy: Secondary | ICD-10-CM | POA: Diagnosis not present

## 2023-05-27 DIAGNOSIS — I129 Hypertensive chronic kidney disease with stage 1 through stage 4 chronic kidney disease, or unspecified chronic kidney disease: Secondary | ICD-10-CM | POA: Insufficient documentation

## 2023-05-27 DIAGNOSIS — D6869 Other thrombophilia: Secondary | ICD-10-CM | POA: Insufficient documentation

## 2023-05-27 DIAGNOSIS — F418 Other specified anxiety disorders: Secondary | ICD-10-CM | POA: Diagnosis not present

## 2023-05-27 DIAGNOSIS — N189 Chronic kidney disease, unspecified: Secondary | ICD-10-CM | POA: Diagnosis not present

## 2023-05-27 DIAGNOSIS — Z7901 Long term (current) use of anticoagulants: Secondary | ICD-10-CM | POA: Diagnosis not present

## 2023-05-27 DIAGNOSIS — Z87891 Personal history of nicotine dependence: Secondary | ICD-10-CM | POA: Diagnosis not present

## 2023-05-27 HISTORY — PX: CARDIOVERSION: EP1203

## 2023-05-27 SURGERY — CARDIOVERSION (CATH LAB)
Anesthesia: General

## 2023-05-27 MED ORDER — LIDOCAINE 2% (20 MG/ML) 5 ML SYRINGE
INTRAMUSCULAR | Status: DC | PRN
  Administered 2023-05-27: 60 mg via INTRAVENOUS

## 2023-05-27 MED ORDER — SODIUM CHLORIDE 0.9% FLUSH: 3.0000 mL | Freq: Two times a day (BID) | INTRAVENOUS | Status: DC

## 2023-05-27 MED ORDER — IPRATROPIUM-ALBUTEROL 0.5-2.5 (3) MG/3ML IN SOLN
RESPIRATORY_TRACT | Status: AC
  Filled 2023-05-27: qty 3

## 2023-05-27 MED ORDER — PROPOFOL 10 MG/ML IV BOLUS
INTRAVENOUS | Status: DC | PRN
  Administered 2023-05-27: 70 mg via INTRAVENOUS

## 2023-05-27 MED ORDER — SODIUM CHLORIDE 0.9% FLUSH: 3.0000 mL | INTRAVENOUS | Status: DC | PRN

## 2023-05-27 MED ORDER — PHENYLEPHRINE HCL (PRESSORS) 10 MG/ML IV SOLN
INTRAVENOUS | Status: DC | PRN
  Administered 2023-05-27 (×4): 80 ug via INTRAVENOUS

## 2023-05-27 MED ORDER — EPHEDRINE SULFATE (PRESSORS) 50 MG/ML IJ SOLN
INTRAMUSCULAR | Status: DC | PRN
  Administered 2023-05-27 (×2): 5 mg via INTRAVENOUS

## 2023-05-27 SURGICAL SUPPLY — 1 items: PAD DEFIB RADIO PHYSIO CONN (PAD) ×2 IMPLANT

## 2023-05-27 NOTE — H&P (Signed)
 Primary Care Physician: Georgina Quint, MD Primary Cardiologist: Maisie Fus, MD Electrophysiologist: None      Referring Physician: Dr. Erskine Emery [name] is a 73 y.o. male with a history of HTN, ESRD on HD, history of bladder cancer, and parxosymal atrial fibrillation who presents for consultation in the [facility]. S/p fistula angiogram by Dr. Juel Burrow noted to be in Afib with RVR; HR 110-140s. Last seen by Cardiology 10/2021 and were pursuing rate control strategy with metoprolol. Patient is on Eliquis 5 mg BID for a CHADS2VASC score of 2.   On evaluation today, he is currently in Afib with RVR. He cannot feel arrhythmia. He notes to feel tired. No missed doses of Eliquis 5 mg BID. He does not know if he is taking metoprolol 25 mg daily which is on medication list.    On follow up 05/23/23, he is currently in Afib. He is taking Toprol 25 mg daily but notes low BP and feeling tired. No missed doses of Eliquis.     Today, he denies symptoms of palpitations, chest pain, shortness of breath, orthopnea, PND, lower extremity edema, dizziness, presyncope, syncope, snoring, daytime somnolence, bleeding, or neurologic sequela. The patient is tolerating medications without difficulties and is otherwise without complaint today.      he has a BMI of Body mass index is 26.29 kg/m.Marland Kitchen    Filed Weights    05/23/23 0828  Weight: 80.7 kg              Current Outpatient Medications  Medication Sig Dispense Refill   acetaminophen (TYLENOL) 500 MG tablet Take 1,000 mg by mouth as needed for mild pain (pain score 1-3) or moderate pain (pain score 4-6).       ELIQUIS 5 MG TABS tablet TAKE 1 TABLET(5 MG) BY MOUTH TWICE DAILY 60 tablet 1   Multiple Vitamin (MULTIVITAMIN) tablet Take 1 tablet by mouth daily. Centem       Omega-3 Fatty Acids (FISH OIL PO) Take 500 mg by mouth every morning.       pantoprazole (PROTONIX) 40 MG tablet TAKE 1 TABLET(40 MG) BY  MOUTH DAILY 90 tablet 1   PARoxetine (PAXIL) 40 MG tablet TAKE 1 TABLET BY MOUTH EVERY DAY 30 tablet 1   tamsulosin (FLOMAX) 0.4 MG CAPS capsule Take 0.4 mg by mouth daily.       metoprolol succinate (TOPROL-XL) 25 MG 24 hr tablet Take 0.5 tablets (12.5 mg total) by mouth every morning. 90 tablet 3               Current Facility-Administered Medications  Medication Dose Route Frequency Provider Last Rate Last Admin   0.9 %  sodium chloride infusion  500 mL Intravenous Once Hilarie Fredrickson, MD       0.9 %  sodium chloride infusion  500 mL Intravenous Continuous Hilarie Fredrickson, MD            Atrial Fibrillation Management history:   Previous antiarrhythmic drugs: none Previous cardioversions: none Previous ablations: none Anticoagulation history: Eliquis     ROS- All systems are reviewed and negative except as per the HPI above.   Physical Exam: BP (!) 88/68   Pulse 81   Ht 5\' 9"  (1.753 m)   Wt 80.7 kg   BMI 26.29 kg/m    GEN- The patient is well appearing, alert and oriented x 3 today.   Neck -  no JVD or carotid bruit noted Lungs- Clear to ausculation bilaterally, normal work of breathing Heart- Irregular rate and rhythm, no murmurs, rubs or gallops, PMI not laterally displaced Extremities- no clubbing, cyanosis, or edema Skin - no rash or ecchymosis noted     EKG today demonstrates  Vent. rate 81 BPM PR interval * ms QRS duration 96 ms QT/QTcB 380/441 ms P-R-T axes * 58 99 Atrial fibrillation with premature ventricular or aberrantly conducted complexes Nonspecific T wave abnormality Abnormal ECG When compared with ECG of 13-May-2023 09:42, PREVIOUS ECG IS PRESENT   Echo 07/07/21 demonstrated   1. Left ventricular ejection fraction, by estimation, is 60 to 65%. The  left ventricle has normal function. The left ventricle has no regional  wall motion abnormalities. Left ventricular diastolic parameters are  consistent with Grade I diastolic  dysfunction (impaired  relaxation).   2. Right ventricular systolic function is normal. The right ventricular  size is normal.   3. Left atrial size was mild to moderately dilated.   4. The mitral valve is grossly normal. Mild mitral valve regurgitation.  No evidence of mitral stenosis.   5. The aortic valve is tricuspid. Aortic valve regurgitation is mild. No  aortic stenosis is present.      ASSESSMENT & PLAN CHA2DS2-VASc Score = 2  The patient's score is based upon: CHF History: 0 HTN History: 1 Diabetes History: 0 Stroke History: 0 Vascular Disease History: 0 Age Score: 1 Gender Score: 0         ASSESSMENT AND PLAN: Persistent Atrial Fibrillation (ICD10:  I48.0) The patient's CHA2DS2-VASc score is 2, indicating a 2.2% annual risk of stroke.     He is currently in Afib. He is taking Toprol 25 mg daily but appears to be hypotensive and feels tired from this. Will decrease dosage to 12.5 mg daily. We discussed since he is still in Afib the procedure called cardioversion to try to convert to NSR. After discussion, patient wishes to proceed with cardioversion. Labs drawn today.    Informed Consent Shared Decision Making/Informed Consent The risks (stroke, cardiac arrhythmias rarely resulting in the need for a temporary or permanent pacemaker, skin irritation or burns and complications associated with conscious sedation including aspiration, arrhythmia, respiratory failure and death), benefits (restoration of normal sinus rhythm) and alternatives of a direct current cardioversion were explained in detail to Christopher [name] and he agrees to proceed.          Based on history of ESRD, going forward his primary mode for rhythm control would be amiodarone.    Secondary Hypercoagulable State (ICD10:  D68.69) The patient is at significant risk for stroke/thromboembolism based upon his CHA2DS2-VASc Score of 2.  Continue Apixaban (Eliquis).  Continue without interruption.       Follow up 2 weeks after DCCV.      [name], PA-C  Afib Clinic [facility] [address] [place], [place] [address] [phone]  For DCCV; compliant with apixaban; no changes. Olga Millers

## 2023-05-27 NOTE — Transfer of Care (Signed)
 Immediate Anesthesia Transfer of Care Note  Patient: Christopher [name]  Procedure(s) Performed: CARDIOVERSION  Patient Location: Cath Lab  Anesthesia Type:MAC  Level of Consciousness: awake and patient cooperative  Airway & Oxygen Therapy: Patient Spontanous Breathing and Patient connected to face mask oxygen  Post-op Assessment: Report given to RN, Post -op Vital signs reviewed and stable, Patient moving all extremities, Patient moving all extremities X 4, and Patient able to stick tongue midline  Post vital signs: Reviewed and stable  Last Vitals:  Vitals Value Taken Time  BP    Temp    Pulse    Resp    SpO2      Last Pain:  Vitals:   05/27/23 0700  TempSrc: Temporal  PainSc: 0-No pain         Complications: No notable events documented.

## 2023-05-27 NOTE — CV Procedure (Signed)
 Electrical Cardioversion Procedure Note Kolbey Teichert [id number] [birth year]  Procedure: Electrical Cardioversion Indications:  Atrial Fibrillation  Procedure Details Consent: Risks of procedure as well as the alternatives and risks of each were explained to the (patient/caregiver).  Consent for procedure obtained. Time Out: Verified patient identification, verified procedure, site/side was marked, verified correct patient position, special equipment/implants available, medications/allergies/relevent history reviewed, required imaging and test results available.  Performed  Patient placed on cardiac monitor, pulse oximetry, supplemental oxygen as necessary.  Sedation given:  pt sedated by anesthesia with lidocaine 60 mg and diprovan 70 mg IV. Pacer pads placed anterior and posterior chest.  Cardioverted 1 time(s).  Cardioverted at 300J.  Evaluation Findings: Post procedure EKG shows: sinus bradycardia Complications: None Patient did tolerate procedure well.   Olga Millers 05/27/2023, 7:09 AM

## 2023-05-27 NOTE — Anesthesia Preprocedure Evaluation (Signed)
 Anesthesia Evaluation  Patient identified by MRN, date of birth, ID band Patient awake    Reviewed: Allergy & Precautions, H&P , NPO status , Patient's Chart, lab work & pertinent test results  Airway Mallampati: II   Neck ROM: full    Dental   Pulmonary former smoker   breath sounds clear to auscultation       Cardiovascular hypertension, + dysrhythmias Atrial Fibrillation  Rhythm:irregular Rate:Normal     Neuro/Psych  PSYCHIATRIC DISORDERS Anxiety Depression       GI/Hepatic   Endo/Other    Renal/GU ESRFRenal disease     Musculoskeletal   Abdominal   Peds  Hematology   Anesthesia Other Findings   Reproductive/Obstetrics                             Anesthesia Physical Anesthesia Plan  ASA: 4  Anesthesia Plan: General   Post-op Pain Management:    Induction: Intravenous  PONV Risk Score and Plan: 2 and Propofol infusion and Treatment may vary due to age or medical condition  Airway Management Planned: Mask  Additional Equipment:   Intra-op Plan:   Post-operative Plan:   Informed Consent: I have reviewed the patients History and Physical, chart, labs and discussed the procedure including the risks, benefits and alternatives for the proposed anesthesia with the patient or authorized representative who has indicated his/her understanding and acceptance.     Dental advisory given  Plan Discussed with: CRNA, Anesthesiologist and Surgeon  Anesthesia Plan Comments:        Anesthesia Quick Evaluation

## 2023-05-27 NOTE — Anesthesia Postprocedure Evaluation (Signed)
 Anesthesia Post Note  Patient: Christopher [name]  Procedure(s) Performed: CARDIOVERSION     Patient location during evaluation: Cath Lab Anesthesia Type: General Level of consciousness: awake and alert Pain management: pain level controlled Vital Signs Assessment: post-procedure vital signs reviewed and stable Respiratory status: spontaneous breathing, nonlabored ventilation, respiratory function stable and patient connected to nasal cannula oxygen Cardiovascular status: blood pressure returned to baseline and stable Postop Assessment: no apparent nausea or vomiting Anesthetic complications: no   No notable events documented.  Last Vitals:  Vitals:   05/27/23 0755 05/27/23 0805  BP: 94/67 94/70  Pulse: 64 67  Resp: (!) 22 (!) 9  Temp:    SpO2: 99% 100%    Last Pain:  Vitals:   05/27/23 0805  TempSrc:   PainSc: 0-No pain                 Ebony Yorio S

## 2023-05-27 NOTE — Interval H&P Note (Signed)
 History and Physical Interval Note:  05/27/2023 7:11 AM  Christopher [name]  has presented today for surgery, with the diagnosis of AFIB.  The various methods of treatment have been discussed with the patient and family. After consideration of risks, benefits and other options for treatment, the patient has consented to  Procedure(s): CARDIOVERSION (N/A) as a surgical intervention.  The patient's history has been reviewed, patient examined, no change in status, stable for surgery.  I have reviewed the patient's chart and labs.  Questions were answered to the patient's satisfaction.     Olga Millers

## 2023-05-28 DIAGNOSIS — Z992 Dependence on renal dialysis: Secondary | ICD-10-CM | POA: Diagnosis not present

## 2023-05-28 DIAGNOSIS — N186 End stage renal disease: Secondary | ICD-10-CM | POA: Diagnosis not present

## 2023-05-28 DIAGNOSIS — N2581 Secondary hyperparathyroidism of renal origin: Secondary | ICD-10-CM | POA: Diagnosis not present

## 2023-05-29 ENCOUNTER — Encounter (HOSPITAL_COMMUNITY): Payer: Self-pay | Admitting: Cardiology

## 2023-05-31 DIAGNOSIS — N186 End stage renal disease: Secondary | ICD-10-CM | POA: Diagnosis not present

## 2023-05-31 DIAGNOSIS — Z992 Dependence on renal dialysis: Secondary | ICD-10-CM | POA: Diagnosis not present

## 2023-05-31 DIAGNOSIS — N2581 Secondary hyperparathyroidism of renal origin: Secondary | ICD-10-CM | POA: Diagnosis not present

## 2023-06-02 DIAGNOSIS — N186 End stage renal disease: Secondary | ICD-10-CM | POA: Diagnosis not present

## 2023-06-02 DIAGNOSIS — N2581 Secondary hyperparathyroidism of renal origin: Secondary | ICD-10-CM | POA: Diagnosis not present

## 2023-06-02 DIAGNOSIS — Z992 Dependence on renal dialysis: Secondary | ICD-10-CM | POA: Diagnosis not present

## 2023-06-04 DIAGNOSIS — N186 End stage renal disease: Secondary | ICD-10-CM | POA: Diagnosis not present

## 2023-06-04 DIAGNOSIS — Z992 Dependence on renal dialysis: Secondary | ICD-10-CM | POA: Diagnosis not present

## 2023-06-04 DIAGNOSIS — N2581 Secondary hyperparathyroidism of renal origin: Secondary | ICD-10-CM | POA: Diagnosis not present

## 2023-06-07 DIAGNOSIS — Z992 Dependence on renal dialysis: Secondary | ICD-10-CM | POA: Diagnosis not present

## 2023-06-07 DIAGNOSIS — N2581 Secondary hyperparathyroidism of renal origin: Secondary | ICD-10-CM | POA: Diagnosis not present

## 2023-06-07 DIAGNOSIS — N186 End stage renal disease: Secondary | ICD-10-CM | POA: Diagnosis not present

## 2023-06-08 ENCOUNTER — Ambulatory Visit (HOSPITAL_COMMUNITY)
Admission: RE | Admit: 2023-06-08 | Discharge: 2023-06-08 | Disposition: A | Discharge status: Home / Self Care | Source: Ambulatory Visit | Attending: Internal Medicine | Admitting: Internal Medicine

## 2023-06-08 VITALS — BP 100/76 | HR 102 | Ht 69.0 in | Wt 176.0 lb

## 2023-06-08 DIAGNOSIS — D6869 Other thrombophilia: Secondary | ICD-10-CM | POA: Diagnosis not present

## 2023-06-08 DIAGNOSIS — Z79899 Other long term (current) drug therapy: Secondary | ICD-10-CM | POA: Diagnosis not present

## 2023-06-08 DIAGNOSIS — I080 Rheumatic disorders of both mitral and aortic valves: Secondary | ICD-10-CM | POA: Diagnosis not present

## 2023-06-08 DIAGNOSIS — N186 End stage renal disease: Secondary | ICD-10-CM | POA: Insufficient documentation

## 2023-06-08 DIAGNOSIS — I4819 Other persistent atrial fibrillation: Secondary | ICD-10-CM

## 2023-06-08 DIAGNOSIS — Z7901 Long term (current) use of anticoagulants: Secondary | ICD-10-CM | POA: Diagnosis not present

## 2023-06-08 DIAGNOSIS — Z992 Dependence on renal dialysis: Secondary | ICD-10-CM | POA: Diagnosis not present

## 2023-06-08 MED ORDER — AMIODARONE HCL 200 MG PO TABS
ORAL_TABLET | ORAL | 1 refills | Status: DC
Start: 2023-06-08 — End: 2023-09-30

## 2023-06-08 NOTE — Progress Notes (Addendum)
 Primary Care Physician: Georgina Quint, MD Primary Cardiologist: Maisie Fus, MD Electrophysiologist: None     Referring Physician: Dr. Erskine Emery Ashir Christopher [name] is a 73 y.o. male with a history of HTN, ESRD on HD, history of bladder cancer, and parxosymal atrial fibrillation who presents for consultation in the [facility]. S/p fistula angiogram by Dr. Juel Burrow noted to be in Afib with RVR; HR 110-140s. Last seen by Cardiology 10/2021 and were pursuing rate control strategy with metoprolol. Patient is on Eliquis 5 mg BID for a CHADS2VASC score of 2.  On evaluation today, he is currently in Afib with RVR. He cannot feel arrhythmia. He notes to feel tired. No missed doses of Eliquis 5 mg BID. He does not know if he is taking metoprolol 25 mg daily which is on medication list.   On follow up 05/23/23, he is currently in Afib. He is taking Toprol 25 mg daily but notes low BP and feeling tired. No missed doses of Eliquis.    On follow up 06/08/23, he is currently in Afib. S/p successful DCCV on 05/27/23. No missed doses of Eliquis. He has no energy when out of rhythm. He notes when he got upset with his grandchildren the other day he felt briefly presyncopal.   Today, he denies symptoms of palpitations, chest pain, shortness of breath, orthopnea, PND, lower extremity edema, dizziness, syncope, snoring, daytime somnolence, bleeding, or neurologic sequela. The patient is tolerating medications without difficulties and is otherwise without complaint today.    he has a BMI of Body mass index is 25.99 kg/m.Marland Kitchen Filed Weights   06/08/23 0852  Weight: 79.8 kg     Current Outpatient Medications  Medication Sig Dispense Refill   acetaminophen (TYLENOL) 500 MG tablet Take 1,000 mg by mouth as needed for mild pain (pain score 1-3) or moderate pain (pain score 4-6).     ELIQUIS 5 MG TABS tablet TAKE 1 TABLET(5 MG) BY MOUTH TWICE DAILY 60 tablet 1   Multiple Vitamin  (MULTIVITAMIN WITH MINERALS) TABS tablet Take 1 tablet by mouth daily.     Omega-3 Fatty Acids (FISH OIL PO) Take 500 mg by mouth every morning.     pantoprazole (PROTONIX) 40 MG tablet TAKE 1 TABLET(40 MG) BY MOUTH DAILY 90 tablet 1   PARoxetine (PAXIL) 40 MG tablet TAKE 1 TABLET BY MOUTH EVERY DAY 30 tablet 1   sevelamer carbonate (RENVELA) 800 MG tablet Take 800 mg by mouth 2 (two) times daily.     tamsulosin (FLOMAX) 0.4 MG CAPS capsule Take 0.4 mg by mouth daily.     Current Facility-Administered Medications  Medication Dose Route Frequency Provider Last Rate Last Admin   0.9 %  sodium chloride infusion  500 mL Intravenous Once Hilarie Fredrickson, MD       0.9 %  sodium chloride infusion  500 mL Intravenous Continuous Hilarie Fredrickson, MD        Atrial Fibrillation Management history:  Previous antiarrhythmic drugs: none Previous cardioversions: 05/27/23 Previous ablations: none Anticoagulation history: Eliquis   ROS- All systems are reviewed and negative except as per the HPI above.  Physical Exam: BP 100/76   Pulse (!) 102   Ht 5\' 9"  (1.753 m)   Wt 79.8 kg   BMI 25.99 kg/m   GEN- The patient is well appearing, alert and oriented x 3 today.   Neck - no JVD or carotid bruit noted Lungs- Clear to  ausculation bilaterally, normal work of breathing Heart- Irregular rate and rhythm, no murmurs, rubs or gallops, PMI not laterally displaced Extremities- no clubbing, cyanosis, or edema Skin - no rash or ecchymosis noted   EKG today demonstrates  Vent. rate 102 BPM PR interval * ms QRS duration 92 ms QT/QTcB 366/477 ms P-R-T axes * 83 80 Atrial fibrillation with rapid ventricular response Nonspecific T wave abnormality Abnormal ECG When compared with ECG of 27-May-2023 07:36, PREVIOUS ECG IS PRESENT  Echo 07/07/21 demonstrated   1. Left ventricular ejection fraction, by estimation, is 60 to 65%. The  left ventricle has normal function. The left ventricle has no regional  wall  motion abnormalities. Left ventricular diastolic parameters are  consistent with Grade I diastolic  dysfunction (impaired relaxation).   2. Right ventricular systolic function is normal. The right ventricular  size is normal.   3. Left atrial size was mild to moderately dilated.   4. The mitral valve is grossly normal. Mild mitral valve regurgitation.  No evidence of mitral stenosis.   5. The aortic valve is tricuspid. Aortic valve regurgitation is mild. No  aortic stenosis is present.    ASSESSMENT & PLAN CHA2DS2-VASc Score = 2  The patient's score is based upon: CHF History: 0 HTN History: 1 Diabetes History: 0 Stroke History: 0 Vascular Disease History: 0 Age Score: 1 Gender Score: 0       ASSESSMENT AND PLAN: Persistent Atrial Fibrillation (ICD10:  I48.0) The patient's CHA2DS2-VASc score is 2, indicating a 2.2% annual risk of stroke.   S/p successful DCCV on 05/27/23.  He is currently in Afib. He has had ERAF despite successful DCCV. Given history of ESRD, his choices for rhythm control are limited and would be amiodarone. We discussed potential adverse effects of amiodarone. After discussion, patient would like to begin amiodarone. Begin amiodarone 200 mg BID x 4 weeks then transition to 200 mg once daily on 4/18. Class C interaction with Paxil will monitor. Due to borderline hypotension likely secondary to dialysis Tuesday, Thursday, and Saturday, will discontinue Toprol. I'm concerned amiodarone and Toprol will concomitantly lower his BP.   Secondary Hypercoagulable State (ICD10:  D68.69) The patient is at significant risk for stroke/thromboembolism based upon his CHA2DS2-VASc Score of 2.  Continue Apixaban (Eliquis).  No missed doses.     Follow up 3 weeks.   [name], PA-C  Afib Clinic [facility] [address] [place], [place] [address] [phone]

## 2023-06-08 NOTE — Patient Instructions (Signed)
 STOP metoprolol    Start amiodarone 200mg  twice a day until 07/08/23 then reduce to once a day take with food

## 2023-06-09 DIAGNOSIS — Z992 Dependence on renal dialysis: Secondary | ICD-10-CM | POA: Diagnosis not present

## 2023-06-09 DIAGNOSIS — N186 End stage renal disease: Secondary | ICD-10-CM | POA: Diagnosis not present

## 2023-06-09 DIAGNOSIS — N2581 Secondary hyperparathyroidism of renal origin: Secondary | ICD-10-CM | POA: Diagnosis not present

## 2023-06-11 DIAGNOSIS — N2581 Secondary hyperparathyroidism of renal origin: Secondary | ICD-10-CM | POA: Diagnosis not present

## 2023-06-11 DIAGNOSIS — Z992 Dependence on renal dialysis: Secondary | ICD-10-CM | POA: Diagnosis not present

## 2023-06-11 DIAGNOSIS — N186 End stage renal disease: Secondary | ICD-10-CM | POA: Diagnosis not present

## 2023-06-14 DIAGNOSIS — N2581 Secondary hyperparathyroidism of renal origin: Secondary | ICD-10-CM | POA: Diagnosis not present

## 2023-06-14 DIAGNOSIS — N186 End stage renal disease: Secondary | ICD-10-CM | POA: Diagnosis not present

## 2023-06-14 DIAGNOSIS — Z992 Dependence on renal dialysis: Secondary | ICD-10-CM | POA: Diagnosis not present

## 2023-06-16 DIAGNOSIS — N2581 Secondary hyperparathyroidism of renal origin: Secondary | ICD-10-CM | POA: Diagnosis not present

## 2023-06-16 DIAGNOSIS — N186 End stage renal disease: Secondary | ICD-10-CM | POA: Diagnosis not present

## 2023-06-16 DIAGNOSIS — Z992 Dependence on renal dialysis: Secondary | ICD-10-CM | POA: Diagnosis not present

## 2023-06-17 ENCOUNTER — Other Ambulatory Visit: Payer: Self-pay | Admitting: Emergency Medicine

## 2023-06-18 DIAGNOSIS — Z992 Dependence on renal dialysis: Secondary | ICD-10-CM | POA: Diagnosis not present

## 2023-06-18 DIAGNOSIS — N186 End stage renal disease: Secondary | ICD-10-CM | POA: Diagnosis not present

## 2023-06-18 DIAGNOSIS — N2581 Secondary hyperparathyroidism of renal origin: Secondary | ICD-10-CM | POA: Diagnosis not present

## 2023-06-20 DIAGNOSIS — N186 End stage renal disease: Secondary | ICD-10-CM | POA: Diagnosis not present

## 2023-06-20 DIAGNOSIS — I129 Hypertensive chronic kidney disease with stage 1 through stage 4 chronic kidney disease, or unspecified chronic kidney disease: Secondary | ICD-10-CM | POA: Diagnosis not present

## 2023-06-20 DIAGNOSIS — Z992 Dependence on renal dialysis: Secondary | ICD-10-CM | POA: Diagnosis not present

## 2023-06-21 DIAGNOSIS — Z992 Dependence on renal dialysis: Secondary | ICD-10-CM | POA: Diagnosis not present

## 2023-06-21 DIAGNOSIS — N2581 Secondary hyperparathyroidism of renal origin: Secondary | ICD-10-CM | POA: Diagnosis not present

## 2023-06-21 DIAGNOSIS — N186 End stage renal disease: Secondary | ICD-10-CM | POA: Diagnosis not present

## 2023-06-23 DIAGNOSIS — Z992 Dependence on renal dialysis: Secondary | ICD-10-CM | POA: Diagnosis not present

## 2023-06-23 DIAGNOSIS — N2581 Secondary hyperparathyroidism of renal origin: Secondary | ICD-10-CM | POA: Diagnosis not present

## 2023-06-23 DIAGNOSIS — N186 End stage renal disease: Secondary | ICD-10-CM | POA: Diagnosis not present

## 2023-06-25 DIAGNOSIS — Z992 Dependence on renal dialysis: Secondary | ICD-10-CM | POA: Diagnosis not present

## 2023-06-25 DIAGNOSIS — N186 End stage renal disease: Secondary | ICD-10-CM | POA: Diagnosis not present

## 2023-06-25 DIAGNOSIS — N2581 Secondary hyperparathyroidism of renal origin: Secondary | ICD-10-CM | POA: Diagnosis not present

## 2023-06-28 DIAGNOSIS — Z992 Dependence on renal dialysis: Secondary | ICD-10-CM | POA: Diagnosis not present

## 2023-06-28 DIAGNOSIS — N186 End stage renal disease: Secondary | ICD-10-CM | POA: Diagnosis not present

## 2023-06-28 DIAGNOSIS — N2581 Secondary hyperparathyroidism of renal origin: Secondary | ICD-10-CM | POA: Diagnosis not present

## 2023-06-29 ENCOUNTER — Ambulatory Visit (HOSPITAL_COMMUNITY)
Admission: RE | Admit: 2023-06-29 | Discharge: 2023-06-29 | Disposition: A | Discharge status: Home / Self Care | Source: Ambulatory Visit | Attending: Internal Medicine | Admitting: Internal Medicine

## 2023-06-29 VITALS — BP 112/70 | HR 68 | Ht 69.0 in | Wt 175.4 lb

## 2023-06-29 DIAGNOSIS — Z79899 Other long term (current) drug therapy: Secondary | ICD-10-CM | POA: Insufficient documentation

## 2023-06-29 DIAGNOSIS — D6869 Other thrombophilia: Secondary | ICD-10-CM | POA: Diagnosis not present

## 2023-06-29 DIAGNOSIS — I4819 Other persistent atrial fibrillation: Secondary | ICD-10-CM | POA: Insufficient documentation

## 2023-06-29 DIAGNOSIS — Z7901 Long term (current) use of anticoagulants: Secondary | ICD-10-CM | POA: Diagnosis not present

## 2023-06-29 DIAGNOSIS — Z992 Dependence on renal dialysis: Secondary | ICD-10-CM | POA: Diagnosis not present

## 2023-06-29 DIAGNOSIS — I120 Hypertensive chronic kidney disease with stage 5 chronic kidney disease or end stage renal disease: Secondary | ICD-10-CM | POA: Insufficient documentation

## 2023-06-29 DIAGNOSIS — N186 End stage renal disease: Secondary | ICD-10-CM | POA: Insufficient documentation

## 2023-06-29 DIAGNOSIS — I480 Paroxysmal atrial fibrillation: Secondary | ICD-10-CM | POA: Diagnosis present

## 2023-06-29 NOTE — Patient Instructions (Signed)
 Please continue amiodarone 200 mg twice daily through 4/17.   On 4/18, take amiodarone 200 mg once daily.

## 2023-06-29 NOTE — Progress Notes (Signed)
 Primary Care Physician: Christopher Quint, MD Primary Cardiologist: Christopher Fus, MD Electrophysiologist: None     Referring Physician: Dr. Erskine [name] Christopher [name] is a 73 y.o. male with a history of HTN, ESRD on HD, history of bladder cancer, and parxosymal atrial fibrillation who presents for consultation in the [facility]. S/p fistula angiogram by Dr. Juel [name] noted to be in Afib with RVR; HR 110-140s. Last seen by Cardiology 10/2021 and were pursuing rate control strategy with metoprolol. Patient is on Eliquis 5 mg BID for a CHADS2VASC score of 2.  On evaluation today, he is currently in Afib with RVR. He cannot feel arrhythmia. He notes to feel tired. No missed doses of Eliquis 5 mg BID. He does not know if he is taking metoprolol 25 mg daily which is on medication list.   On follow up 05/23/23, he is currently in Afib. He is taking Toprol 25 mg daily but notes low BP and feeling tired. No missed doses of Eliquis.    On follow up 06/08/23, he is currently in Afib. S/p successful DCCV on 05/27/23. No missed doses of Eliquis. He has no energy when out of rhythm. He notes when he got upset with his grandchildren the other day he felt briefly presyncopal.   On follow up 06/29/23, he is currently in NSR. He began amiodarone load at last office visit. He continues on amiodarone 200 mg BID. No missed doses of Eliquis. He feels better and has more energy.  Today, he denies symptoms of palpitations, chest pain, shortness of breath, orthopnea, PND, lower extremity edema, dizziness, syncope, snoring, daytime somnolence, bleeding, or neurologic sequela. The patient is tolerating medications without difficulties and is otherwise without complaint today.    he has a BMI of Body mass index is 25.9 kg/m.Marland Kitchen Filed Weights   06/29/23 0859  Weight: 79.6 kg      Current Outpatient Medications  Medication Sig Dispense Refill   acetaminophen (TYLENOL) 500 MG tablet Take  1,000 mg by mouth as needed for mild pain (pain score 1-3) or moderate pain (pain score 4-6).     amiodarone (PACERONE) 200 MG tablet Take 1 tablet (200 mg total) by mouth 2 (two) times daily for 30 days, THEN 1 tablet (200 mg total) daily. 90 tablet 1   ELIQUIS 5 MG TABS tablet TAKE 1 TABLET(5 MG) BY MOUTH TWICE DAILY 60 tablet 1   Multiple Vitamin (MULTIVITAMIN WITH MINERALS) TABS tablet Take 1 tablet by mouth daily.     Omega-3 Fatty Acids (FISH OIL PO) Take 500 mg by mouth every morning.     pantoprazole (PROTONIX) 40 MG tablet TAKE 1 TABLET(40 MG) BY MOUTH DAILY 90 tablet 1   PARoxetine (PAXIL) 40 MG tablet TAKE 1 TABLET BY MOUTH EVERY DAY 30 tablet 1   sevelamer carbonate (RENVELA) 800 MG tablet Take 800 mg by mouth 2 (two) times daily.     tamsulosin (FLOMAX) 0.4 MG CAPS capsule Take 0.4 mg by mouth daily.     Current Facility-Administered Medications  Medication Dose Route Frequency Provider Last Rate Last Admin   0.9 %  sodium chloride infusion  500 mL Intravenous Once Hilarie Fredrickson, MD       0.9 %  sodium chloride infusion  500 mL Intravenous Continuous Hilarie Fredrickson, MD        Atrial Fibrillation Management history:  Previous antiarrhythmic drugs: none Previous cardioversions: 05/27/23 Previous ablations: none Anticoagulation history:  Eliquis   ROS- All systems are reviewed and negative except as per the HPI above.  Physical Exam: BP 112/70   Pulse 68   Ht 5\' 9"  (1.753 m)   Wt 79.6 kg   BMI 25.90 kg/m   GEN- The patient is well appearing, alert and oriented x 3 today.   Neck - no JVD or carotid bruit noted Lungs- Clear to ausculation bilaterally, normal work of breathing Heart- Regular rate and rhythm, no murmurs, rubs or gallops, PMI not laterally displaced Extremities- no clubbing, cyanosis, or edema Skin - no rash or ecchymosis noted   EKG today demonstrates  Vent. rate 68 BPM PR interval 186 ms QRS duration 96 ms QT/QTcB 450/478 ms P-R-T axes 82 98  81 Normal sinus rhythm Rightward axis Cannot rule out Anterior infarct , age undetermined Abnormal ECG When compared with ECG of 08-Jun-2023 09:00, PREVIOUS ECG IS PRESENT  Echo 07/07/21 demonstrated   1. Left ventricular ejection fraction, by estimation, is 60 to 65%. The  left ventricle has normal function. The left ventricle has no regional  wall motion abnormalities. Left ventricular diastolic parameters are  consistent with Grade I diastolic  dysfunction (impaired relaxation).   2. Right ventricular systolic function is normal. The right ventricular  size is normal.   3. Left atrial size was mild to moderately dilated.   4. The mitral valve is grossly normal. Mild mitral valve regurgitation.  No evidence of mitral stenosis.   5. The aortic valve is tricuspid. Aortic valve regurgitation is mild. No  aortic stenosis is present.    ASSESSMENT & PLAN CHA2DS2-VASc Score = 2  The patient's score is based upon: CHF History: 0 HTN History: 1 Diabetes History: 0 Stroke History: 0 Vascular Disease History: 0 Age Score: 1 Gender Score: 0      ASSESSMENT AND PLAN: Persistent Atrial Fibrillation (ICD10:  I48.0) The patient's CHA2DS2-VASc score is 2, indicating a 2.2% annual risk of stroke.   S/p successful DCCV on 05/27/23.  He is currently in NSR. He began amiodarone load on 3/19 200 mg BID x 4 weeks. He will transition to amiodarone 200 mg once daily on 4/18.  High risk medication monitoring (ICD10: R7229428) Patient requires ongoing monitoring for anti-arrhythmic medication which has the potential to cause life threatening arrhythmias or AV block. Qtc stable. Continue amiodarone 200 mg BID.   Secondary Hypercoagulable State (ICD10:  D68.69) The patient is at significant risk for stroke/thromboembolism based upon his CHA2DS2-VASc Score of 2.  Continue Apixaban (Eliquis).  No missed doses.     Follow up 3 months for amiodarone surveillance.   [name], PA-C  Afib  Clinic [facility] [address] [place], [place] [address] [phone]

## 2023-07-02 DIAGNOSIS — N2581 Secondary hyperparathyroidism of renal origin: Secondary | ICD-10-CM | POA: Diagnosis not present

## 2023-07-02 DIAGNOSIS — Z992 Dependence on renal dialysis: Secondary | ICD-10-CM | POA: Diagnosis not present

## 2023-07-02 DIAGNOSIS — N186 End stage renal disease: Secondary | ICD-10-CM | POA: Diagnosis not present

## 2023-07-04 ENCOUNTER — Other Ambulatory Visit: Payer: Self-pay | Admitting: Internal Medicine

## 2023-07-04 NOTE — Telephone Encounter (Signed)
 Schedule office visit

## 2023-07-05 DIAGNOSIS — N186 End stage renal disease: Secondary | ICD-10-CM | POA: Diagnosis not present

## 2023-07-05 DIAGNOSIS — N2581 Secondary hyperparathyroidism of renal origin: Secondary | ICD-10-CM | POA: Diagnosis not present

## 2023-07-05 DIAGNOSIS — Z992 Dependence on renal dialysis: Secondary | ICD-10-CM | POA: Diagnosis not present

## 2023-07-07 DIAGNOSIS — N2581 Secondary hyperparathyroidism of renal origin: Secondary | ICD-10-CM | POA: Diagnosis not present

## 2023-07-07 DIAGNOSIS — Z992 Dependence on renal dialysis: Secondary | ICD-10-CM | POA: Diagnosis not present

## 2023-07-07 DIAGNOSIS — N186 End stage renal disease: Secondary | ICD-10-CM | POA: Diagnosis not present

## 2023-07-09 DIAGNOSIS — N2581 Secondary hyperparathyroidism of renal origin: Secondary | ICD-10-CM | POA: Diagnosis not present

## 2023-07-09 DIAGNOSIS — N186 End stage renal disease: Secondary | ICD-10-CM | POA: Diagnosis not present

## 2023-07-09 DIAGNOSIS — Z992 Dependence on renal dialysis: Secondary | ICD-10-CM | POA: Diagnosis not present

## 2023-07-12 DIAGNOSIS — N186 End stage renal disease: Secondary | ICD-10-CM | POA: Diagnosis not present

## 2023-07-12 DIAGNOSIS — Z992 Dependence on renal dialysis: Secondary | ICD-10-CM | POA: Diagnosis not present

## 2023-07-12 DIAGNOSIS — N2581 Secondary hyperparathyroidism of renal origin: Secondary | ICD-10-CM | POA: Diagnosis not present

## 2023-07-14 DIAGNOSIS — N186 End stage renal disease: Secondary | ICD-10-CM | POA: Diagnosis not present

## 2023-07-14 DIAGNOSIS — Z992 Dependence on renal dialysis: Secondary | ICD-10-CM | POA: Diagnosis not present

## 2023-07-14 DIAGNOSIS — N2581 Secondary hyperparathyroidism of renal origin: Secondary | ICD-10-CM | POA: Diagnosis not present

## 2023-07-16 DIAGNOSIS — N2581 Secondary hyperparathyroidism of renal origin: Secondary | ICD-10-CM | POA: Diagnosis not present

## 2023-07-16 DIAGNOSIS — N186 End stage renal disease: Secondary | ICD-10-CM | POA: Diagnosis not present

## 2023-07-16 DIAGNOSIS — Z992 Dependence on renal dialysis: Secondary | ICD-10-CM | POA: Diagnosis not present

## 2023-07-19 DIAGNOSIS — Z992 Dependence on renal dialysis: Secondary | ICD-10-CM | POA: Diagnosis not present

## 2023-07-19 DIAGNOSIS — N186 End stage renal disease: Secondary | ICD-10-CM | POA: Diagnosis not present

## 2023-07-19 DIAGNOSIS — N2581 Secondary hyperparathyroidism of renal origin: Secondary | ICD-10-CM | POA: Diagnosis not present

## 2023-07-20 DIAGNOSIS — I129 Hypertensive chronic kidney disease with stage 1 through stage 4 chronic kidney disease, or unspecified chronic kidney disease: Secondary | ICD-10-CM | POA: Diagnosis not present

## 2023-07-20 DIAGNOSIS — Z992 Dependence on renal dialysis: Secondary | ICD-10-CM | POA: Diagnosis not present

## 2023-07-20 DIAGNOSIS — N186 End stage renal disease: Secondary | ICD-10-CM | POA: Diagnosis not present

## 2023-07-21 DIAGNOSIS — N186 End stage renal disease: Secondary | ICD-10-CM | POA: Diagnosis not present

## 2023-07-21 DIAGNOSIS — Z992 Dependence on renal dialysis: Secondary | ICD-10-CM | POA: Diagnosis not present

## 2023-07-21 DIAGNOSIS — N2581 Secondary hyperparathyroidism of renal origin: Secondary | ICD-10-CM | POA: Diagnosis not present

## 2023-07-23 DIAGNOSIS — Z992 Dependence on renal dialysis: Secondary | ICD-10-CM | POA: Diagnosis not present

## 2023-07-23 DIAGNOSIS — N186 End stage renal disease: Secondary | ICD-10-CM | POA: Diagnosis not present

## 2023-07-23 DIAGNOSIS — N2581 Secondary hyperparathyroidism of renal origin: Secondary | ICD-10-CM | POA: Diagnosis not present

## 2023-07-26 DIAGNOSIS — N186 End stage renal disease: Secondary | ICD-10-CM | POA: Diagnosis not present

## 2023-07-26 DIAGNOSIS — N2581 Secondary hyperparathyroidism of renal origin: Secondary | ICD-10-CM | POA: Diagnosis not present

## 2023-07-26 DIAGNOSIS — Z992 Dependence on renal dialysis: Secondary | ICD-10-CM | POA: Diagnosis not present

## 2023-07-28 DIAGNOSIS — Z992 Dependence on renal dialysis: Secondary | ICD-10-CM | POA: Diagnosis not present

## 2023-07-28 DIAGNOSIS — N186 End stage renal disease: Secondary | ICD-10-CM | POA: Diagnosis not present

## 2023-07-28 DIAGNOSIS — N2581 Secondary hyperparathyroidism of renal origin: Secondary | ICD-10-CM | POA: Diagnosis not present

## 2023-07-30 DIAGNOSIS — N2581 Secondary hyperparathyroidism of renal origin: Secondary | ICD-10-CM | POA: Diagnosis not present

## 2023-07-30 DIAGNOSIS — Z992 Dependence on renal dialysis: Secondary | ICD-10-CM | POA: Diagnosis not present

## 2023-07-30 DIAGNOSIS — N186 End stage renal disease: Secondary | ICD-10-CM | POA: Diagnosis not present

## 2023-08-02 DIAGNOSIS — N186 End stage renal disease: Secondary | ICD-10-CM | POA: Diagnosis not present

## 2023-08-02 DIAGNOSIS — Z992 Dependence on renal dialysis: Secondary | ICD-10-CM | POA: Diagnosis not present

## 2023-08-02 DIAGNOSIS — N2581 Secondary hyperparathyroidism of renal origin: Secondary | ICD-10-CM | POA: Diagnosis not present

## 2023-08-04 DIAGNOSIS — N2581 Secondary hyperparathyroidism of renal origin: Secondary | ICD-10-CM | POA: Diagnosis not present

## 2023-08-04 DIAGNOSIS — N186 End stage renal disease: Secondary | ICD-10-CM | POA: Diagnosis not present

## 2023-08-04 DIAGNOSIS — Z992 Dependence on renal dialysis: Secondary | ICD-10-CM | POA: Diagnosis not present

## 2023-08-06 DIAGNOSIS — Z992 Dependence on renal dialysis: Secondary | ICD-10-CM | POA: Diagnosis not present

## 2023-08-06 DIAGNOSIS — N2581 Secondary hyperparathyroidism of renal origin: Secondary | ICD-10-CM | POA: Diagnosis not present

## 2023-08-06 DIAGNOSIS — N186 End stage renal disease: Secondary | ICD-10-CM | POA: Diagnosis not present

## 2023-08-09 DIAGNOSIS — N2581 Secondary hyperparathyroidism of renal origin: Secondary | ICD-10-CM | POA: Diagnosis not present

## 2023-08-09 DIAGNOSIS — N186 End stage renal disease: Secondary | ICD-10-CM | POA: Diagnosis not present

## 2023-08-09 DIAGNOSIS — Z992 Dependence on renal dialysis: Secondary | ICD-10-CM | POA: Diagnosis not present

## 2023-08-11 DIAGNOSIS — N186 End stage renal disease: Secondary | ICD-10-CM | POA: Diagnosis not present

## 2023-08-11 DIAGNOSIS — Z992 Dependence on renal dialysis: Secondary | ICD-10-CM | POA: Diagnosis not present

## 2023-08-11 DIAGNOSIS — N2581 Secondary hyperparathyroidism of renal origin: Secondary | ICD-10-CM | POA: Diagnosis not present

## 2023-08-13 DIAGNOSIS — N2581 Secondary hyperparathyroidism of renal origin: Secondary | ICD-10-CM | POA: Diagnosis not present

## 2023-08-13 DIAGNOSIS — N186 End stage renal disease: Secondary | ICD-10-CM | POA: Diagnosis not present

## 2023-08-13 DIAGNOSIS — Z992 Dependence on renal dialysis: Secondary | ICD-10-CM | POA: Diagnosis not present

## 2023-08-16 DIAGNOSIS — N186 End stage renal disease: Secondary | ICD-10-CM | POA: Diagnosis not present

## 2023-08-16 DIAGNOSIS — Z992 Dependence on renal dialysis: Secondary | ICD-10-CM | POA: Diagnosis not present

## 2023-08-16 DIAGNOSIS — N2581 Secondary hyperparathyroidism of renal origin: Secondary | ICD-10-CM | POA: Diagnosis not present

## 2023-08-17 ENCOUNTER — Other Ambulatory Visit: Payer: Self-pay | Admitting: Emergency Medicine

## 2023-08-18 DIAGNOSIS — N2581 Secondary hyperparathyroidism of renal origin: Secondary | ICD-10-CM | POA: Diagnosis not present

## 2023-08-18 DIAGNOSIS — Z992 Dependence on renal dialysis: Secondary | ICD-10-CM | POA: Diagnosis not present

## 2023-08-18 DIAGNOSIS — N186 End stage renal disease: Secondary | ICD-10-CM | POA: Diagnosis not present

## 2023-08-20 DIAGNOSIS — N2581 Secondary hyperparathyroidism of renal origin: Secondary | ICD-10-CM | POA: Diagnosis not present

## 2023-08-20 DIAGNOSIS — N186 End stage renal disease: Secondary | ICD-10-CM | POA: Diagnosis not present

## 2023-08-20 DIAGNOSIS — Z992 Dependence on renal dialysis: Secondary | ICD-10-CM | POA: Diagnosis not present

## 2023-08-20 DIAGNOSIS — I129 Hypertensive chronic kidney disease with stage 1 through stage 4 chronic kidney disease, or unspecified chronic kidney disease: Secondary | ICD-10-CM | POA: Diagnosis not present

## 2023-08-23 DIAGNOSIS — Z992 Dependence on renal dialysis: Secondary | ICD-10-CM | POA: Diagnosis not present

## 2023-08-23 DIAGNOSIS — N186 End stage renal disease: Secondary | ICD-10-CM | POA: Diagnosis not present

## 2023-08-23 DIAGNOSIS — N2581 Secondary hyperparathyroidism of renal origin: Secondary | ICD-10-CM | POA: Diagnosis not present

## 2023-08-25 DIAGNOSIS — Z992 Dependence on renal dialysis: Secondary | ICD-10-CM | POA: Diagnosis not present

## 2023-08-25 DIAGNOSIS — N186 End stage renal disease: Secondary | ICD-10-CM | POA: Diagnosis not present

## 2023-08-25 DIAGNOSIS — N2581 Secondary hyperparathyroidism of renal origin: Secondary | ICD-10-CM | POA: Diagnosis not present

## 2023-08-27 DIAGNOSIS — N2581 Secondary hyperparathyroidism of renal origin: Secondary | ICD-10-CM | POA: Diagnosis not present

## 2023-08-27 DIAGNOSIS — N186 End stage renal disease: Secondary | ICD-10-CM | POA: Diagnosis not present

## 2023-08-27 DIAGNOSIS — Z992 Dependence on renal dialysis: Secondary | ICD-10-CM | POA: Diagnosis not present

## 2023-08-30 DIAGNOSIS — N186 End stage renal disease: Secondary | ICD-10-CM | POA: Diagnosis not present

## 2023-08-30 DIAGNOSIS — Z992 Dependence on renal dialysis: Secondary | ICD-10-CM | POA: Diagnosis not present

## 2023-08-30 DIAGNOSIS — N2581 Secondary hyperparathyroidism of renal origin: Secondary | ICD-10-CM | POA: Diagnosis not present

## 2023-09-01 DIAGNOSIS — Z992 Dependence on renal dialysis: Secondary | ICD-10-CM | POA: Diagnosis not present

## 2023-09-01 DIAGNOSIS — N2581 Secondary hyperparathyroidism of renal origin: Secondary | ICD-10-CM | POA: Diagnosis not present

## 2023-09-01 DIAGNOSIS — N186 End stage renal disease: Secondary | ICD-10-CM | POA: Diagnosis not present

## 2023-09-03 DIAGNOSIS — N186 End stage renal disease: Secondary | ICD-10-CM | POA: Diagnosis not present

## 2023-09-03 DIAGNOSIS — Z992 Dependence on renal dialysis: Secondary | ICD-10-CM | POA: Diagnosis not present

## 2023-09-03 DIAGNOSIS — N2581 Secondary hyperparathyroidism of renal origin: Secondary | ICD-10-CM | POA: Diagnosis not present

## 2023-09-06 DIAGNOSIS — N186 End stage renal disease: Secondary | ICD-10-CM | POA: Diagnosis not present

## 2023-09-06 DIAGNOSIS — Z992 Dependence on renal dialysis: Secondary | ICD-10-CM | POA: Diagnosis not present

## 2023-09-06 DIAGNOSIS — N2581 Secondary hyperparathyroidism of renal origin: Secondary | ICD-10-CM | POA: Diagnosis not present

## 2023-09-08 DIAGNOSIS — Z992 Dependence on renal dialysis: Secondary | ICD-10-CM | POA: Diagnosis not present

## 2023-09-08 DIAGNOSIS — N2581 Secondary hyperparathyroidism of renal origin: Secondary | ICD-10-CM | POA: Diagnosis not present

## 2023-09-08 DIAGNOSIS — N186 End stage renal disease: Secondary | ICD-10-CM | POA: Diagnosis not present

## 2023-09-10 DIAGNOSIS — N186 End stage renal disease: Secondary | ICD-10-CM | POA: Diagnosis not present

## 2023-09-10 DIAGNOSIS — Z992 Dependence on renal dialysis: Secondary | ICD-10-CM | POA: Diagnosis not present

## 2023-09-10 DIAGNOSIS — N2581 Secondary hyperparathyroidism of renal origin: Secondary | ICD-10-CM | POA: Diagnosis not present

## 2023-09-11 ENCOUNTER — Other Ambulatory Visit: Payer: Self-pay | Admitting: Internal Medicine

## 2023-09-13 DIAGNOSIS — N2581 Secondary hyperparathyroidism of renal origin: Secondary | ICD-10-CM | POA: Diagnosis not present

## 2023-09-13 DIAGNOSIS — N186 End stage renal disease: Secondary | ICD-10-CM | POA: Diagnosis not present

## 2023-09-13 DIAGNOSIS — Z992 Dependence on renal dialysis: Secondary | ICD-10-CM | POA: Diagnosis not present

## 2023-09-15 DIAGNOSIS — Z992 Dependence on renal dialysis: Secondary | ICD-10-CM | POA: Diagnosis not present

## 2023-09-15 DIAGNOSIS — N2581 Secondary hyperparathyroidism of renal origin: Secondary | ICD-10-CM | POA: Diagnosis not present

## 2023-09-15 DIAGNOSIS — N186 End stage renal disease: Secondary | ICD-10-CM | POA: Diagnosis not present

## 2023-09-17 DIAGNOSIS — N186 End stage renal disease: Secondary | ICD-10-CM | POA: Diagnosis not present

## 2023-09-17 DIAGNOSIS — Z992 Dependence on renal dialysis: Secondary | ICD-10-CM | POA: Diagnosis not present

## 2023-09-17 DIAGNOSIS — N2581 Secondary hyperparathyroidism of renal origin: Secondary | ICD-10-CM | POA: Diagnosis not present

## 2023-09-19 DIAGNOSIS — N186 End stage renal disease: Secondary | ICD-10-CM | POA: Diagnosis not present

## 2023-09-19 DIAGNOSIS — I129 Hypertensive chronic kidney disease with stage 1 through stage 4 chronic kidney disease, or unspecified chronic kidney disease: Secondary | ICD-10-CM | POA: Diagnosis not present

## 2023-09-19 DIAGNOSIS — Z992 Dependence on renal dialysis: Secondary | ICD-10-CM | POA: Diagnosis not present

## 2023-09-20 DIAGNOSIS — N2581 Secondary hyperparathyroidism of renal origin: Secondary | ICD-10-CM | POA: Diagnosis not present

## 2023-09-20 DIAGNOSIS — Z992 Dependence on renal dialysis: Secondary | ICD-10-CM | POA: Diagnosis not present

## 2023-09-20 DIAGNOSIS — N186 End stage renal disease: Secondary | ICD-10-CM | POA: Diagnosis not present

## 2023-09-24 DIAGNOSIS — N186 End stage renal disease: Secondary | ICD-10-CM | POA: Diagnosis not present

## 2023-09-24 DIAGNOSIS — Z992 Dependence on renal dialysis: Secondary | ICD-10-CM | POA: Diagnosis not present

## 2023-09-24 DIAGNOSIS — N2581 Secondary hyperparathyroidism of renal origin: Secondary | ICD-10-CM | POA: Diagnosis not present

## 2023-09-27 DIAGNOSIS — N186 End stage renal disease: Secondary | ICD-10-CM | POA: Diagnosis not present

## 2023-09-27 DIAGNOSIS — N2581 Secondary hyperparathyroidism of renal origin: Secondary | ICD-10-CM | POA: Diagnosis not present

## 2023-09-27 DIAGNOSIS — Z992 Dependence on renal dialysis: Secondary | ICD-10-CM | POA: Diagnosis not present

## 2023-09-29 ENCOUNTER — Ambulatory Visit (HOSPITAL_COMMUNITY): Admitting: Internal Medicine

## 2023-09-29 DIAGNOSIS — N186 End stage renal disease: Secondary | ICD-10-CM | POA: Diagnosis not present

## 2023-09-29 DIAGNOSIS — Z992 Dependence on renal dialysis: Secondary | ICD-10-CM | POA: Diagnosis not present

## 2023-09-29 DIAGNOSIS — N2581 Secondary hyperparathyroidism of renal origin: Secondary | ICD-10-CM | POA: Diagnosis not present

## 2023-09-30 ENCOUNTER — Ambulatory Visit (HOSPITAL_COMMUNITY)
Admission: RE | Admit: 2023-09-30 | Discharge: 2023-09-30 | Disposition: A | Discharge status: Home / Self Care | Source: Ambulatory Visit | Attending: Internal Medicine | Admitting: Internal Medicine

## 2023-09-30 VITALS — BP 104/68 | HR 86 | Ht 69.0 in | Wt 172.0 lb

## 2023-09-30 DIAGNOSIS — I4891 Unspecified atrial fibrillation: Secondary | ICD-10-CM

## 2023-09-30 DIAGNOSIS — Z79899 Other long term (current) drug therapy: Secondary | ICD-10-CM

## 2023-09-30 DIAGNOSIS — D6869 Other thrombophilia: Secondary | ICD-10-CM | POA: Diagnosis not present

## 2023-09-30 DIAGNOSIS — Z5181 Encounter for therapeutic drug level monitoring: Secondary | ICD-10-CM | POA: Diagnosis not present

## 2023-09-30 DIAGNOSIS — I4819 Other persistent atrial fibrillation: Secondary | ICD-10-CM | POA: Diagnosis not present

## 2023-09-30 MED ORDER — AMIODARONE HCL 200 MG PO TABS: ORAL_TABLET | ORAL | 1 refills | Status: DC

## 2023-09-30 NOTE — Patient Instructions (Signed)
 Increase amiodarone  200mg  twice a day for 30 days (10/31/23) then reduce to once a day

## 2023-09-30 NOTE — Progress Notes (Signed)
 Primary Care Physician: Purcell Emil Schanz, MD Primary Cardiologist: Alvan Ronal BRAVO, MD (Inactive) Electrophysiologist: None     Referring Physician: Dr. Melia Rush Christopher [name] is a 73 y.o. male with a history of HTN, ESRD on HD, history of bladder cancer, and parxosymal atrial fibrillation who presents for consultation in the [facility]. S/p fistula angiogram by Dr. Melia noted to be in Afib with RVR; HR 110-140s. Last seen by Cardiology 10/2021 and were pursuing rate control strategy with metoprolol . Patient is on Eliquis  5 mg BID for a CHADS2VASC score of 2. S/p successful DCCV on 05/27/23.   On follow up 09/30/23, patient is here for amiodarone  surveillance. He is currently in atrial flutter. No bleeding issues on Eliquis . No missed doses of Eliquis . His dialysis schedule is every T, Th, Sat. He does not have cardiac awareness of arrhythmia.  Today, he denies symptoms of palpitations, chest pain, shortness of breath, orthopnea, PND, lower extremity edema, dizziness, syncope, snoring, daytime somnolence, bleeding, or neurologic sequela. The patient is tolerating medications without difficulties and is otherwise without complaint today.    he has a BMI of Body mass index is 25.4 kg/m.SABRA Filed Weights   09/30/23 0918  Weight: 78 kg    Current Outpatient Medications  Medication Sig Dispense Refill   acetaminophen  (TYLENOL ) 500 MG tablet Take 1,000 mg by mouth as needed for mild pain (pain score 1-3) or moderate pain (pain score 4-6).     ELIQUIS  5 MG TABS tablet TAKE 1 TABLET(5 MG) BY MOUTH TWICE DAILY 60 tablet 1   Multiple Vitamin (MULTIVITAMIN WITH MINERALS) TABS tablet Take 1 tablet by mouth daily.     Omega-3 Fatty Acids (FISH OIL PO) Take 500 mg by mouth every morning.     pantoprazole  (PROTONIX ) 40 MG tablet TAKE 1 TABLET(40 MG) BY MOUTH DAILY 90 tablet 1   PARoxetine  (PAXIL ) 40 MG tablet TAKE 1 TABLET BY MOUTH EVERY DAY 30 tablet 1   SENSIPAR  30 MG tablet Taking 1 tablet by mouth three times weekly with meals     sevelamer  carbonate (RENVELA ) 800 MG tablet Take 800 mg by mouth 2 (two) times daily.     tamsulosin  (FLOMAX ) 0.4 MG CAPS capsule Take 0.4 mg by mouth daily.     VELPHORO 500 MG chewable tablet Chew 500 mg by mouth every morning.     amiodarone  (PACERONE ) 200 MG tablet Take 1 tablet (200 mg total) by mouth 2 (two) times daily for 30 days, THEN 1 tablet (200 mg total) daily. 90 tablet 1   Current Facility-Administered Medications  Medication Dose Route Frequency Provider Last Rate Last Admin   0.9 %  sodium chloride  infusion  500 mL Intravenous Once Abran Rush SAILOR, MD       0.9 %  sodium chloride  infusion  500 mL Intravenous Continuous Abran Rush SAILOR, MD        Atrial Fibrillation Management history:  Previous antiarrhythmic drugs: none Previous cardioversions: 05/27/23 Previous ablations: none Anticoagulation history: Eliquis    ROS- All systems are reviewed and negative except as per the HPI above.  Physical Exam: BP 104/68   Pulse 86   Ht 5' 9 (1.753 m)   Wt 78 kg   BMI 25.40 kg/m   GEN- The patient is well appearing, alert and oriented x 3 today.   Neck - no JVD or carotid bruit noted Lungs- Clear to ausculation bilaterally, normal work of breathing Heart- Irregular  rate and rhythm, no murmurs, rubs or gallops, PMI not laterally displaced Extremities- no clubbing, cyanosis, or edema Skin - no rash or ecchymosis noted   EKG today demonstrates  Vent. rate 86 BPM PR interval * ms QRS duration 100 ms QT/QTcB 416/497 ms P-R-T axes * 76 88 Atrial flutter with variable A-V block Cannot rule out Anterior infarct (cited on or before 29-Jun-2023) Abnormal ECG When compared with ECG of 29-Jun-2023 09:07, Atrial flutter has replaced Sinus rhythm  Echo 07/07/21 demonstrated   1. Left ventricular ejection fraction, by estimation, is 60 to 65%. The  left ventricle has normal function. The left ventricle has no  regional  wall motion abnormalities. Left ventricular diastolic parameters are  consistent with Grade I diastolic  dysfunction (impaired relaxation).   2. Right ventricular systolic function is normal. The right ventricular  size is normal.   3. Left atrial size was mild to moderately dilated.   4. The mitral valve is grossly normal. Mild mitral valve regurgitation.  No evidence of mitral stenosis.   5. The aortic valve is tricuspid. Aortic valve regurgitation is mild. No  aortic stenosis is present.    ASSESSMENT & PLAN CHA2DS2-VASc Score = 2  The patient's score is based upon: CHF History: 0 HTN History: 1 Diabetes History: 0 Stroke History: 0 Vascular Disease History: 0 Age Score: 1 Gender Score: 0      ASSESSMENT AND PLAN: Persistent Atrial Fibrillation (ICD10:  I48.0) The patient's CHA2DS2-VASc score is 2, indicating a 2.2% annual risk of stroke.   S/p successful DCCV on 05/27/23.  He is currently in atrial flutter. I will postpone lab work for amiodarone  today on the account of possibly needing a cardioversion. Will increase amiodarone  to 200 mg BID x 1 month and reassess patient to determine if needs repeat DCCV.   High risk medication monitoring (ICD10: U5195107) Patient requires ongoing monitoring for anti-arrhythmic medication which has the potential to cause life threatening arrhythmias or AV block. Qtc stable. Increase amiodarone  to 200 mg BID x 1 month then transition to 200 mg once daily.    Secondary Hypercoagulable State (ICD10:  D68.69) The patient is at significant risk for stroke/thromboembolism based upon his CHA2DS2-VASc Score of 2.  Continue Apixaban  (Eliquis ).  Continue Eliquis  5 mg BID without interruption.    Follow up ~1 month to determine if needs cardioversion.   Terra Pac, PA-C  Afib Clinic [facility] [address] [place], [place] [address] [phone]

## 2023-10-01 DIAGNOSIS — Z992 Dependence on renal dialysis: Secondary | ICD-10-CM | POA: Diagnosis not present

## 2023-10-01 DIAGNOSIS — N186 End stage renal disease: Secondary | ICD-10-CM | POA: Diagnosis not present

## 2023-10-01 DIAGNOSIS — N2581 Secondary hyperparathyroidism of renal origin: Secondary | ICD-10-CM | POA: Diagnosis not present

## 2023-10-04 DIAGNOSIS — N186 End stage renal disease: Secondary | ICD-10-CM | POA: Diagnosis not present

## 2023-10-04 DIAGNOSIS — N2581 Secondary hyperparathyroidism of renal origin: Secondary | ICD-10-CM | POA: Diagnosis not present

## 2023-10-04 DIAGNOSIS — Z992 Dependence on renal dialysis: Secondary | ICD-10-CM | POA: Diagnosis not present

## 2023-10-06 DIAGNOSIS — N2581 Secondary hyperparathyroidism of renal origin: Secondary | ICD-10-CM | POA: Diagnosis not present

## 2023-10-06 DIAGNOSIS — Z992 Dependence on renal dialysis: Secondary | ICD-10-CM | POA: Diagnosis not present

## 2023-10-06 DIAGNOSIS — N186 End stage renal disease: Secondary | ICD-10-CM | POA: Diagnosis not present

## 2023-10-08 DIAGNOSIS — N186 End stage renal disease: Secondary | ICD-10-CM | POA: Diagnosis not present

## 2023-10-08 DIAGNOSIS — N2581 Secondary hyperparathyroidism of renal origin: Secondary | ICD-10-CM | POA: Diagnosis not present

## 2023-10-08 DIAGNOSIS — Z992 Dependence on renal dialysis: Secondary | ICD-10-CM | POA: Diagnosis not present

## 2023-10-11 DIAGNOSIS — N186 End stage renal disease: Secondary | ICD-10-CM | POA: Diagnosis not present

## 2023-10-11 DIAGNOSIS — Z992 Dependence on renal dialysis: Secondary | ICD-10-CM | POA: Diagnosis not present

## 2023-10-11 DIAGNOSIS — N2581 Secondary hyperparathyroidism of renal origin: Secondary | ICD-10-CM | POA: Diagnosis not present

## 2023-10-13 DIAGNOSIS — Z992 Dependence on renal dialysis: Secondary | ICD-10-CM | POA: Diagnosis not present

## 2023-10-13 DIAGNOSIS — N2581 Secondary hyperparathyroidism of renal origin: Secondary | ICD-10-CM | POA: Diagnosis not present

## 2023-10-13 DIAGNOSIS — N186 End stage renal disease: Secondary | ICD-10-CM | POA: Diagnosis not present

## 2023-10-18 DIAGNOSIS — N186 End stage renal disease: Secondary | ICD-10-CM | POA: Diagnosis not present

## 2023-10-18 DIAGNOSIS — N2581 Secondary hyperparathyroidism of renal origin: Secondary | ICD-10-CM | POA: Diagnosis not present

## 2023-10-18 DIAGNOSIS — Z992 Dependence on renal dialysis: Secondary | ICD-10-CM | POA: Diagnosis not present

## 2023-10-19 ENCOUNTER — Other Ambulatory Visit: Payer: Self-pay | Admitting: Emergency Medicine

## 2023-10-20 DIAGNOSIS — N2581 Secondary hyperparathyroidism of renal origin: Secondary | ICD-10-CM | POA: Diagnosis not present

## 2023-10-20 DIAGNOSIS — I129 Hypertensive chronic kidney disease with stage 1 through stage 4 chronic kidney disease, or unspecified chronic kidney disease: Secondary | ICD-10-CM | POA: Diagnosis not present

## 2023-10-20 DIAGNOSIS — Z992 Dependence on renal dialysis: Secondary | ICD-10-CM | POA: Diagnosis not present

## 2023-10-20 DIAGNOSIS — N186 End stage renal disease: Secondary | ICD-10-CM | POA: Diagnosis not present

## 2023-10-22 DIAGNOSIS — N2581 Secondary hyperparathyroidism of renal origin: Secondary | ICD-10-CM | POA: Diagnosis not present

## 2023-10-22 DIAGNOSIS — Z992 Dependence on renal dialysis: Secondary | ICD-10-CM | POA: Diagnosis not present

## 2023-10-22 DIAGNOSIS — N186 End stage renal disease: Secondary | ICD-10-CM | POA: Diagnosis not present

## 2023-10-25 DIAGNOSIS — N186 End stage renal disease: Secondary | ICD-10-CM | POA: Diagnosis not present

## 2023-10-25 DIAGNOSIS — N2581 Secondary hyperparathyroidism of renal origin: Secondary | ICD-10-CM | POA: Diagnosis not present

## 2023-10-25 DIAGNOSIS — Z992 Dependence on renal dialysis: Secondary | ICD-10-CM | POA: Diagnosis not present

## 2023-10-27 DIAGNOSIS — N2581 Secondary hyperparathyroidism of renal origin: Secondary | ICD-10-CM | POA: Diagnosis not present

## 2023-10-27 DIAGNOSIS — N186 End stage renal disease: Secondary | ICD-10-CM | POA: Diagnosis not present

## 2023-10-27 DIAGNOSIS — Z992 Dependence on renal dialysis: Secondary | ICD-10-CM | POA: Diagnosis not present

## 2023-10-28 ENCOUNTER — Ambulatory Visit (HOSPITAL_COMMUNITY)
Admission: RE | Admit: 2023-10-28 | Discharge: 2023-10-28 | Disposition: A | Discharge status: Home / Self Care | Source: Ambulatory Visit | Attending: Internal Medicine | Admitting: Internal Medicine

## 2023-10-28 ENCOUNTER — Other Ambulatory Visit (HOSPITAL_COMMUNITY): Payer: Self-pay | Admitting: Internal Medicine

## 2023-10-28 VITALS — BP 120/76 | HR 74 | Ht 69.0 in | Wt 173.0 lb

## 2023-10-28 DIAGNOSIS — D6869 Other thrombophilia: Secondary | ICD-10-CM | POA: Diagnosis not present

## 2023-10-28 DIAGNOSIS — Z79899 Other long term (current) drug therapy: Secondary | ICD-10-CM | POA: Diagnosis not present

## 2023-10-28 DIAGNOSIS — I4891 Unspecified atrial fibrillation: Secondary | ICD-10-CM | POA: Diagnosis not present

## 2023-10-28 DIAGNOSIS — Z5181 Encounter for therapeutic drug level monitoring: Secondary | ICD-10-CM

## 2023-10-28 DIAGNOSIS — I4819 Other persistent atrial fibrillation: Secondary | ICD-10-CM | POA: Diagnosis not present

## 2023-10-28 LAB — BASIC METABOLIC PANEL WITH GFR
BUN/Creatinine Ratio: 5 — ABNORMAL LOW (ref 10–24)
BUN: 20 mg/dL (ref 8–27)
CO2: 31 mmol/L — ABNORMAL HIGH (ref 20–29)
Calcium: 9.7 mg/dL (ref 8.6–10.2)
Chloride: 99 mmol/L (ref 96–106)
Creatinine, Ser: 4.44 mg/dL — ABNORMAL HIGH (ref 0.76–1.27)
Glucose: 120 mg/dL — ABNORMAL HIGH (ref 70–99)
Potassium: 4.8 mmol/L (ref 3.5–5.2)
Sodium: 140 mmol/L (ref 134–144)
eGFR: 13 mL/min/1.73 — ABNORMAL LOW (ref >59)

## 2023-10-28 LAB — CBC
Hematocrit: 40 % (ref 37.5–51.0)
Hemoglobin: 13 g/dL (ref 13.0–17.7)
MCH: 29.3 pg (ref 26.6–33.0)
MCHC: 32.5 g/dL (ref 31.5–35.7)
MCV: 90 fL (ref 79–97)
Platelets: 267 x10E3/uL (ref 150–450)
RBC: 4.43 x10E6/uL (ref 4.14–5.80)
RDW: 16.7 % — ABNORMAL HIGH (ref 11.6–15.4)
WBC: 6.4 x10E3/uL (ref 3.4–10.8)

## 2023-10-28 NOTE — Patient Instructions (Signed)
 Cardioversion scheduled for: 11/04/23 Friday 7:00am   - Arrive at the [facility] ([address]  and check in with ADMITTING at 7:00am   - Do not eat or drink anything after midnight the night prior to your procedure.   - Take all your morning medication (except diabetic medications) with a sip of water prior to arrival.  - Do NOT miss any doses of your blood thinner - if you should miss a dose or take a dose more than 4 hours late -- please notify our office immediately.  - You will not be able to drive home after your procedure. Please ensure you have a responsible adult to drive you home. You will need someone with you for 24 hours post procedure.     - Expect to be in the procedural area approximately 2 hours.   - If you feel as if you go back into normal rhythm prior to scheduled cardioversion, please notify our office immediately.   If your procedure is canceled in the cardioversion suite you will be charged a cancellation fee.

## 2023-10-28 NOTE — Progress Notes (Signed)
 Primary Care Physician: Purcell Emil Schanz, MD Primary Cardiologist: Alvan Ronal BRAVO, MD (Inactive) Electrophysiologist: None     Referring Physician: Dr. Melia Rush Christopher [name] is a 73 y.o. male with a history of HTN, ESRD on HD, history of bladder cancer, and parxosymal atrial fibrillation who presents for consultation in the [facility]. S/p fistula angiogram by Dr. Melia noted to be in Afib with RVR; HR 110-140s. Last seen by Cardiology 10/2021 and were pursuing rate control strategy with metoprolol . Patient is on Eliquis  5 mg BID for a CHADS2VASC score of 2. S/p successful DCCV on 05/27/23.   On follow up 09/30/23, patient is here for amiodarone  surveillance. He is currently in atrial flutter. No bleeding issues on Eliquis . No missed doses of Eliquis . His dialysis schedule is every T, Th, Sat. He does not have cardiac awareness of arrhythmia.  On follow up 10/28/23, patient is currently in Afib. He reloaded amiodarone  after noted to being in atrial flutter last visit. No missed doses of Eliquis .   Today, he denies symptoms of palpitations, chest pain, shortness of breath, orthopnea, PND, lower extremity edema, dizziness, syncope, snoring, daytime somnolence, bleeding, or neurologic sequela. The patient is tolerating medications without difficulties and is otherwise without complaint today.    he has a BMI of Body mass index is 25.55 kg/m.SABRA Filed Weights   10/28/23 0939  Weight: 78.5 kg     Current Outpatient Medications  Medication Sig Dispense Refill   acetaminophen  (TYLENOL ) 500 MG tablet Take 1,000 mg by mouth as needed for mild pain (pain score 1-3) or moderate pain (pain score 4-6).     amiodarone  (PACERONE ) 200 MG tablet Take 1 tablet (200 mg total) by mouth 2 (two) times daily for 30 days, THEN 1 tablet (200 mg total) daily. 90 tablet 1   B Complex-C-Folic Acid (RENAL-VITE) 0.8 MG TABS Take 1 tablet by mouth every morning.     ELIQUIS  5 MG  TABS tablet TAKE 1 TABLET(5 MG) BY MOUTH TWICE DAILY 60 tablet 1   Multiple Vitamin (MULTIVITAMIN WITH MINERALS) TABS tablet Take 1 tablet by mouth daily.     Omega-3 Fatty Acids (FISH OIL PO) Take 500 mg by mouth every morning.     pantoprazole  (PROTONIX ) 40 MG tablet TAKE 1 TABLET(40 MG) BY MOUTH DAILY 90 tablet 1   PARoxetine  (PAXIL ) 40 MG tablet TAKE 1 TABLET BY MOUTH EVERY DAY 30 tablet 1   SENSIPAR 30 MG tablet Taking 1 tablet by mouth three times weekly with meals     sevelamer  carbonate (RENVELA ) 800 MG tablet Take 800 mg by mouth 2 (two) times daily.     tamsulosin  (FLOMAX ) 0.4 MG CAPS capsule Take 0.4 mg by mouth daily.     VELPHORO 500 MG chewable tablet Chew 500 mg by mouth every morning.     Current Facility-Administered Medications  Medication Dose Route Frequency Provider Last Rate Last Admin   0.9 %  sodium chloride  infusion  500 mL Intravenous Once Abran Rush SAILOR, MD       0.9 %  sodium chloride  infusion  500 mL Intravenous Continuous Abran Rush SAILOR, MD        Atrial Fibrillation Management history:  Previous antiarrhythmic drugs: amiodarone  Previous cardioversions: 05/27/23 Previous ablations: none Anticoagulation history: Eliquis    ROS- All systems are reviewed and negative except as per the HPI above.  Physical Exam: BP 120/76   Pulse 74   Ht 5' 9 (  1.753 m)   Wt 78.5 kg   BMI 25.55 kg/m   GEN- The patient is well appearing, alert and oriented x 3 today.   Neck - no JVD or carotid bruit noted Lungs- Clear to ausculation bilaterally, normal work of breathing Heart- Irregular rate and rhythm, no murmurs, rubs or gallops, PMI not laterally displaced Extremities- no clubbing, cyanosis, or edema Skin - no rash or ecchymosis noted   EKG today demonstrates  Vent. rate 74 BPM PR interval * ms QRS duration 98 ms QT/QTcB 418/463 ms P-R-T axes * 36 82 Atrial fibrillation Nonspecific T wave abnormality Prolonged QT Abnormal ECG When compared with ECG of  30-Sep-2023 09:26, Atrial fibrillation has replaced Atrial flutter  Echo 07/07/21 demonstrated   1. Left ventricular ejection fraction, by estimation, is 60 to 65%. The  left ventricle has normal function. The left ventricle has no regional  wall motion abnormalities. Left ventricular diastolic parameters are  consistent with Grade I diastolic  dysfunction (impaired relaxation).   2. Right ventricular systolic function is normal. The right ventricular  size is normal.   3. Left atrial size was mild to moderately dilated.   4. The mitral valve is grossly normal. Mild mitral valve regurgitation.  No evidence of mitral stenosis.   5. The aortic valve is tricuspid. Aortic valve regurgitation is mild. No  aortic stenosis is present.    ASSESSMENT & PLAN CHA2DS2-VASc Score = 2  The patient's score is based upon: CHF History: 0 HTN History: 1 Diabetes History: 0 Stroke History: 0 Vascular Disease History: 0 Age Score: 1 Gender Score: 0      ASSESSMENT AND PLAN: Persistent Atrial Fibrillation (ICD10:  I48.0) The patient's CHA2DS2-VASc score is 2, indicating a 2.2% annual risk of stroke.   S/p successful DCCV on 05/27/23.  He is currently in Afib. We discussed the procedure cardioversion to try to convert to NSR. We discussed the risks vs benefits of this procedure and how ultimately we cannot predict whether a patient will have early return of arrhythmia post procedure. Given reload of amiodarone , higher probability of maintaining normal rhythm after procedure. After discussion, the patient wishes to proceed with cardioversion. Labs drawn today.   Informed Consent   Shared Decision Making/Informed Consent The risks (stroke, cardiac arrhythmias rarely resulting in the need for a temporary or permanent pacemaker, skin irritation or burns and complications associated with conscious sedation including aspiration, arrhythmia, respiratory failure and death), benefits (restoration of normal  sinus rhythm) and alternatives of a direct current cardioversion were explained in detail to [name] and he agrees to proceed.      High risk medication monitoring (ICD10: J342684) Patient requires ongoing monitoring for anti-arrhythmic medication which has the potential to cause life threatening arrhythmias or AV block. Qtc stable. Continue amiodarone  200 mg daily.   Secondary Hypercoagulable State (ICD10:  D68.69) The patient is at significant risk for stroke/thromboembolism based upon his CHA2DS2-VASc Score of 2.  Continue Apixaban  (Eliquis ).  No missed doses of Eliquis .     Follow up 2 weeks after DCCV.   Terra Pac, PA-C  Afib Clinic [facility] [address] [place], [place] [address] [phone]

## 2023-10-29 DIAGNOSIS — N2581 Secondary hyperparathyroidism of renal origin: Secondary | ICD-10-CM | POA: Diagnosis not present

## 2023-10-29 DIAGNOSIS — N186 End stage renal disease: Secondary | ICD-10-CM | POA: Diagnosis not present

## 2023-10-29 DIAGNOSIS — Z992 Dependence on renal dialysis: Secondary | ICD-10-CM | POA: Diagnosis not present

## 2023-11-01 ENCOUNTER — Ambulatory Visit (HOSPITAL_COMMUNITY): Payer: Self-pay | Admitting: Physician Assistant

## 2023-11-01 DIAGNOSIS — N2581 Secondary hyperparathyroidism of renal origin: Secondary | ICD-10-CM | POA: Diagnosis not present

## 2023-11-01 DIAGNOSIS — Z992 Dependence on renal dialysis: Secondary | ICD-10-CM | POA: Diagnosis not present

## 2023-11-01 DIAGNOSIS — N186 End stage renal disease: Secondary | ICD-10-CM | POA: Diagnosis not present

## 2023-11-03 DIAGNOSIS — N2581 Secondary hyperparathyroidism of renal origin: Secondary | ICD-10-CM | POA: Diagnosis not present

## 2023-11-03 DIAGNOSIS — Z992 Dependence on renal dialysis: Secondary | ICD-10-CM | POA: Diagnosis not present

## 2023-11-03 DIAGNOSIS — N186 End stage renal disease: Secondary | ICD-10-CM | POA: Diagnosis not present

## 2023-11-04 ENCOUNTER — Ambulatory Visit (HOSPITAL_COMMUNITY)
Admission: RE | Admit: 2023-11-04 | Discharge: 2023-11-04 | Disposition: A | Discharge status: Home / Self Care | Attending: Cardiology | Admitting: Cardiology

## 2023-11-04 ENCOUNTER — Encounter (HOSPITAL_COMMUNITY)
Admission: RE | Disposition: A | Discharge status: Home / Self Care | Payer: Self-pay | Source: Home / Self Care | Attending: Cardiology

## 2023-11-04 ENCOUNTER — Other Ambulatory Visit: Payer: Self-pay

## 2023-11-04 ENCOUNTER — Ambulatory Visit (HOSPITAL_COMMUNITY): Admitting: Anesthesiology

## 2023-11-04 DIAGNOSIS — Z87891 Personal history of nicotine dependence: Secondary | ICD-10-CM | POA: Diagnosis not present

## 2023-11-04 DIAGNOSIS — I4819 Other persistent atrial fibrillation: Secondary | ICD-10-CM

## 2023-11-04 DIAGNOSIS — I10 Essential (primary) hypertension: Secondary | ICD-10-CM | POA: Insufficient documentation

## 2023-11-04 DIAGNOSIS — Z539 Procedure and treatment not carried out, unspecified reason: Secondary | ICD-10-CM | POA: Diagnosis not present

## 2023-11-04 DIAGNOSIS — I4891 Unspecified atrial fibrillation: Secondary | ICD-10-CM | POA: Diagnosis not present

## 2023-11-04 SURGERY — CARDIOVERSION (CATH LAB)
Anesthesia: General

## 2023-11-04 NOTE — Progress Notes (Signed)
 Patient presented for cardioversion today for Afib but found to be in sinus rhythm, confirmed on EKG.  Procedure cancelled.  Christopher LITTIE Nanas, MD

## 2023-11-04 NOTE — Anesthesia Preprocedure Evaluation (Addendum)
 Anesthesia Evaluation    Airway        Dental   Pulmonary former smoker          Cardiovascular hypertension, + dysrhythmias Atrial Fibrillation      Neuro/Psych   Anxiety Depression    negative neurological ROS     GI/Hepatic negative GI ROS, Neg liver ROS,,,  Endo/Other  negative endocrine ROS    Renal/GU CRF and DialysisRenal disease  negative genitourinary   Musculoskeletal negative musculoskeletal ROS (+)    Abdominal   Peds  Hematology Lab Results      Component                Value               Date                      WBC                      6.4                 10/28/2023                HGB                      13.0                10/28/2023                HCT                      40.0                10/28/2023                MCV                      90                  10/28/2023                PLT                      267                 10/28/2023              Anesthesia Other Findings   Reproductive/Obstetrics                              Anesthesia Physical Anesthesia Plan  ASA: 3  Anesthesia Plan: General   Post-op Pain Management:    Induction:   PONV Risk Score and Plan: 1 and Treatment may vary due to age or medical condition  Airway Management Planned: Mask  Additional Equipment: None  Intra-op Plan:   Post-operative Plan:   Informed Consent:   Plan Discussed with:   Anesthesia Plan Comments:          Anesthesia Quick Evaluation

## 2023-11-05 DIAGNOSIS — N186 End stage renal disease: Secondary | ICD-10-CM | POA: Diagnosis not present

## 2023-11-05 DIAGNOSIS — Z992 Dependence on renal dialysis: Secondary | ICD-10-CM | POA: Diagnosis not present

## 2023-11-05 DIAGNOSIS — N2581 Secondary hyperparathyroidism of renal origin: Secondary | ICD-10-CM | POA: Diagnosis not present

## 2023-11-08 DIAGNOSIS — Z992 Dependence on renal dialysis: Secondary | ICD-10-CM | POA: Diagnosis not present

## 2023-11-08 DIAGNOSIS — N2581 Secondary hyperparathyroidism of renal origin: Secondary | ICD-10-CM | POA: Diagnosis not present

## 2023-11-08 DIAGNOSIS — N186 End stage renal disease: Secondary | ICD-10-CM | POA: Diagnosis not present

## 2023-11-09 ENCOUNTER — Other Ambulatory Visit: Payer: Self-pay | Admitting: Internal Medicine

## 2023-11-10 DIAGNOSIS — Z992 Dependence on renal dialysis: Secondary | ICD-10-CM | POA: Diagnosis not present

## 2023-11-10 DIAGNOSIS — N186 End stage renal disease: Secondary | ICD-10-CM | POA: Diagnosis not present

## 2023-11-10 DIAGNOSIS — N2581 Secondary hyperparathyroidism of renal origin: Secondary | ICD-10-CM | POA: Diagnosis not present

## 2023-11-12 DIAGNOSIS — N2581 Secondary hyperparathyroidism of renal origin: Secondary | ICD-10-CM | POA: Diagnosis not present

## 2023-11-12 DIAGNOSIS — Z992 Dependence on renal dialysis: Secondary | ICD-10-CM | POA: Diagnosis not present

## 2023-11-12 DIAGNOSIS — N186 End stage renal disease: Secondary | ICD-10-CM | POA: Diagnosis not present

## 2023-11-13 ENCOUNTER — Other Ambulatory Visit: Payer: Self-pay | Admitting: Emergency Medicine

## 2023-11-13 DIAGNOSIS — R12 Heartburn: Secondary | ICD-10-CM

## 2023-11-15 DIAGNOSIS — N186 End stage renal disease: Secondary | ICD-10-CM | POA: Diagnosis not present

## 2023-11-15 DIAGNOSIS — Z992 Dependence on renal dialysis: Secondary | ICD-10-CM | POA: Diagnosis not present

## 2023-11-15 DIAGNOSIS — N2581 Secondary hyperparathyroidism of renal origin: Secondary | ICD-10-CM | POA: Diagnosis not present

## 2023-11-17 DIAGNOSIS — N2581 Secondary hyperparathyroidism of renal origin: Secondary | ICD-10-CM | POA: Diagnosis not present

## 2023-11-17 DIAGNOSIS — Z992 Dependence on renal dialysis: Secondary | ICD-10-CM | POA: Diagnosis not present

## 2023-11-17 DIAGNOSIS — N186 End stage renal disease: Secondary | ICD-10-CM | POA: Diagnosis not present

## 2023-11-18 ENCOUNTER — Ambulatory Visit (HOSPITAL_COMMUNITY)
Admission: RE | Admit: 2023-11-18 | Source: Ambulatory Visit | Attending: Internal Medicine | Admitting: Internal Medicine

## 2023-11-18 ENCOUNTER — Encounter (HOSPITAL_COMMUNITY): Payer: Self-pay

## 2023-11-18 NOTE — Progress Notes (Incomplete)
 Primary Care Physician: Purcell Emil Schanz, MD Primary Cardiologist: Alvan Ronal BRAVO, MD (Inactive) Electrophysiologist: None     Referring Physician: Dr. Melia Rush Hernando Reali is a 73 y.o. male with a history of HTN, ESRD on HD, history of bladder cancer, and parxosymal atrial fibrillation who presents for consultation in the [facility]. S/p fistula angiogram by Dr. Melia noted to be in Afib with RVR; HR 110-140s. Last seen by Cardiology 10/2021 and were pursuing rate control strategy with metoprolol . Patient is on Eliquis  5 mg BID for a CHADS2VASC score of 2. S/p successful DCCV on 05/27/23.   On follow up 09/30/23, patient is here for amiodarone  surveillance. He is currently in atrial flutter. No bleeding issues on Eliquis . No missed doses of Eliquis . His dialysis schedule is every T, Th, Sat. He does not have cardiac awareness of arrhythmia.  On follow up 10/28/23, patient is currently in Afib. He reloaded amiodarone  after noted to being in atrial flutter last visit. No missed doses of Eliquis .   On follow up 11/18/23, patient is currently in ***. He presented for cardioversion on 8/15 but procedure was canceled due to patient being in normal rhythm. He is taking amiodarone  200 mg daily. No missed doses of Eliquis .   Today, he denies symptoms of palpitations, chest pain, shortness of breath, orthopnea, PND, lower extremity edema, dizziness, syncope, snoring, daytime somnolence, bleeding, or neurologic sequela. The patient is tolerating medications without difficulties and is otherwise without complaint today.    he has a BMI of There is no height or weight on file to calculate BMI.. There were no vitals filed for this visit.    Current Outpatient Medications  Medication Sig Dispense Refill   acetaminophen  (TYLENOL ) 500 MG tablet Take 1,000 mg by mouth as needed for mild pain (pain score 1-3) or moderate pain (pain score 4-6).     amiodarone  (PACERONE )  200 MG tablet Take 1 tablet (200 mg total) by mouth 2 (two) times daily for 30 days, THEN 1 tablet (200 mg total) daily. (Patient taking differently: 1 tablet (200 mg total) daily.) 90 tablet 1   B Complex-C-Folic Acid (RENAL-VITE) 0.8 MG TABS Take 1 tablet by mouth every morning.     ELIQUIS  5 MG TABS tablet TAKE 1 TABLET(5 MG) BY MOUTH TWICE DAILY 60 tablet 1   Multiple Vitamin (MULTIVITAMIN WITH MINERALS) TABS tablet Take 1 tablet by mouth daily.     Omega-3 Fatty Acids (FISH OIL PO) Take 500 mg by mouth every morning.     pantoprazole  (PROTONIX ) 40 MG tablet TAKE 1 TABLET(40 MG) BY MOUTH DAILY 90 tablet 1   PARoxetine  (PAXIL ) 40 MG tablet TAKE 1 TABLET BY MOUTH EVERY DAY 90 tablet 0   SENSIPAR 30 MG tablet Take 30 mg by mouth every Monday, Wednesday, and Friday with hemodialysis. Taking 1 tablet by mouth three times weekly with meals     tamsulosin  (FLOMAX ) 0.4 MG CAPS capsule Take 0.4 mg by mouth daily.     VELPHORO 500 MG chewable tablet Chew 500 mg by mouth 3 (three) times daily with meals.     Current Facility-Administered Medications  Medication Dose Route Frequency Provider Last Rate Last Admin   0.9 %  sodium chloride  infusion  500 mL Intravenous Once Abran Rush SAILOR, MD       0.9 %  sodium chloride  infusion  500 mL Intravenous Continuous Abran Rush SAILOR, MD  Atrial Fibrillation Management history:  Previous antiarrhythmic drugs: amiodarone  Previous cardioversions: 05/27/23 Previous ablations: none Anticoagulation history: Eliquis    ROS- All systems are reviewed and negative except as per the HPI above.  Physical Exam: There were no vitals taken for this visit.  GEN- The patient is well appearing, alert and oriented x 3 today.   Neck - no JVD or carotid bruit noted Lungs- Clear to ausculation bilaterally, normal work of breathing Heart- ***Regular rate and rhythm, no murmurs, rubs or gallops, PMI not laterally displaced Extremities- no clubbing, cyanosis, or edema Skin -  no rash or ecchymosis noted   EKG today demonstrates  ***  Echo 07/07/21 demonstrated   1. Left ventricular ejection fraction, by estimation, is 60 to 65%. The  left ventricle has normal function. The left ventricle has no regional  wall motion abnormalities. Left ventricular diastolic parameters are  consistent with Grade I diastolic  dysfunction (impaired relaxation).   2. Right ventricular systolic function is normal. The right ventricular  size is normal.   3. Left atrial size was mild to moderately dilated.   4. The mitral valve is grossly normal. Mild mitral valve regurgitation.  No evidence of mitral stenosis.   5. The aortic valve is tricuspid. Aortic valve regurgitation is mild. No  aortic stenosis is present.    ASSESSMENT & PLAN CHA2DS2-VASc Score = 2  The patient's score is based upon: CHF History: 0 HTN History: 1 Diabetes History: 0 Stroke History: 0 Vascular Disease History: 0 Age Score: 1 Gender Score: 0      ASSESSMENT AND PLAN: Persistent Atrial Fibrillation (ICD10:  I48.0) The patient's CHA2DS2-VASc score is 2, indicating a 2.2% annual risk of stroke.   S/p successful DCCV on 05/27/23.  Patient is currently in ***. Presented on 8/15 for cardioversion but procedure was canceled due to patient being in normal rhythm.   High risk medication monitoring (ICD10: J342684) Patient requires ongoing monitoring for anti-arrhythmic medication which has the potential to cause life threatening arrhythmias or AV block. Qtc stable. Continue amiodarone  200 mg daily.   Secondary Hypercoagulable State (ICD10:  D68.69) The patient is at significant risk for stroke/thromboembolism based upon his CHA2DS2-VASc Score of 2.  Continue Apixaban  (Eliquis ).  No missed doses of Eliquis .     Follow up ***.    Terra Pac, PA-C  [facility] [address] [place], [place] [address] [phone]

## 2023-11-19 DIAGNOSIS — N2581 Secondary hyperparathyroidism of renal origin: Secondary | ICD-10-CM | POA: Diagnosis not present

## 2023-11-19 DIAGNOSIS — Z992 Dependence on renal dialysis: Secondary | ICD-10-CM | POA: Diagnosis not present

## 2023-11-19 DIAGNOSIS — N186 End stage renal disease: Secondary | ICD-10-CM | POA: Diagnosis not present

## 2023-11-20 DIAGNOSIS — Z992 Dependence on renal dialysis: Secondary | ICD-10-CM | POA: Diagnosis not present

## 2023-11-20 DIAGNOSIS — N186 End stage renal disease: Secondary | ICD-10-CM | POA: Diagnosis not present

## 2023-11-20 DIAGNOSIS — I129 Hypertensive chronic kidney disease with stage 1 through stage 4 chronic kidney disease, or unspecified chronic kidney disease: Secondary | ICD-10-CM | POA: Diagnosis not present

## 2023-11-22 DIAGNOSIS — N2581 Secondary hyperparathyroidism of renal origin: Secondary | ICD-10-CM | POA: Diagnosis not present

## 2023-11-22 DIAGNOSIS — Z992 Dependence on renal dialysis: Secondary | ICD-10-CM | POA: Diagnosis not present

## 2023-11-22 DIAGNOSIS — N186 End stage renal disease: Secondary | ICD-10-CM | POA: Diagnosis not present

## 2023-11-24 DIAGNOSIS — N2581 Secondary hyperparathyroidism of renal origin: Secondary | ICD-10-CM | POA: Diagnosis not present

## 2023-11-24 DIAGNOSIS — N186 End stage renal disease: Secondary | ICD-10-CM | POA: Diagnosis not present

## 2023-11-24 DIAGNOSIS — Z992 Dependence on renal dialysis: Secondary | ICD-10-CM | POA: Diagnosis not present

## 2023-11-26 DIAGNOSIS — Z992 Dependence on renal dialysis: Secondary | ICD-10-CM | POA: Diagnosis not present

## 2023-11-26 DIAGNOSIS — N186 End stage renal disease: Secondary | ICD-10-CM | POA: Diagnosis not present

## 2023-11-26 DIAGNOSIS — N2581 Secondary hyperparathyroidism of renal origin: Secondary | ICD-10-CM | POA: Diagnosis not present

## 2023-11-29 DIAGNOSIS — N2581 Secondary hyperparathyroidism of renal origin: Secondary | ICD-10-CM | POA: Diagnosis not present

## 2023-11-29 DIAGNOSIS — N186 End stage renal disease: Secondary | ICD-10-CM | POA: Diagnosis not present

## 2023-11-29 DIAGNOSIS — Z992 Dependence on renal dialysis: Secondary | ICD-10-CM | POA: Diagnosis not present

## 2023-12-01 DIAGNOSIS — N186 End stage renal disease: Secondary | ICD-10-CM | POA: Diagnosis not present

## 2023-12-01 DIAGNOSIS — N2581 Secondary hyperparathyroidism of renal origin: Secondary | ICD-10-CM | POA: Diagnosis not present

## 2023-12-01 DIAGNOSIS — Z992 Dependence on renal dialysis: Secondary | ICD-10-CM | POA: Diagnosis not present

## 2023-12-03 DIAGNOSIS — N186 End stage renal disease: Secondary | ICD-10-CM | POA: Diagnosis not present

## 2023-12-03 DIAGNOSIS — N2581 Secondary hyperparathyroidism of renal origin: Secondary | ICD-10-CM | POA: Diagnosis not present

## 2023-12-03 DIAGNOSIS — Z992 Dependence on renal dialysis: Secondary | ICD-10-CM | POA: Diagnosis not present

## 2023-12-06 DIAGNOSIS — Z992 Dependence on renal dialysis: Secondary | ICD-10-CM | POA: Diagnosis not present

## 2023-12-06 DIAGNOSIS — N186 End stage renal disease: Secondary | ICD-10-CM | POA: Diagnosis not present

## 2023-12-06 DIAGNOSIS — N2581 Secondary hyperparathyroidism of renal origin: Secondary | ICD-10-CM | POA: Diagnosis not present

## 2023-12-07 ENCOUNTER — Encounter (HOSPITAL_COMMUNITY): Payer: Self-pay | Admitting: Internal Medicine

## 2023-12-07 ENCOUNTER — Ambulatory Visit (HOSPITAL_COMMUNITY)
Admission: RE | Admit: 2023-12-07 | Discharge: 2023-12-07 | Disposition: A | Discharge status: Home / Self Care | Source: Ambulatory Visit | Attending: Internal Medicine | Admitting: Internal Medicine

## 2023-12-07 VITALS — BP 110/66 | HR 58 | Ht 69.0 in | Wt 174.6 lb

## 2023-12-07 DIAGNOSIS — I4819 Other persistent atrial fibrillation: Secondary | ICD-10-CM

## 2023-12-07 DIAGNOSIS — Z79899 Other long term (current) drug therapy: Secondary | ICD-10-CM

## 2023-12-07 DIAGNOSIS — Z5181 Encounter for therapeutic drug level monitoring: Secondary | ICD-10-CM | POA: Diagnosis not present

## 2023-12-07 DIAGNOSIS — D6869 Other thrombophilia: Secondary | ICD-10-CM | POA: Diagnosis not present

## 2023-12-07 MED ORDER — APIXABAN 5 MG PO TABS: 5.0000 mg | ORAL_TABLET | Freq: Two times a day (BID) | ORAL | 6 refills | Status: AC

## 2023-12-07 NOTE — Progress Notes (Signed)
 Primary Care Physician: Purcell Emil Schanz, MD Primary Cardiologist: Alvan Ronal BRAVO, MD (Inactive) Electrophysiologist: None     Referring Physician: Dr. Melia Rush Diandre Merica is a 73 y.o. male with a history of HTN, ESRD on HD, history of bladder cancer, and parxosymal atrial fibrillation who presents for consultation in the [facility]. S/p fistula angiogram by Dr. Melia noted to be in Afib with RVR; HR 110-140s. Last seen by Cardiology 10/2021 and were pursuing rate control strategy with metoprolol . Patient is on Eliquis  5 mg BID for a CHADS2VASC score of 2. S/p successful DCCV on 05/27/23.   On follow up 09/30/23, patient is here for amiodarone  surveillance. He is currently in atrial flutter. No bleeding issues on Eliquis . No missed doses of Eliquis . His dialysis schedule is every T, Th, Sat. He does not have cardiac awareness of arrhythmia.  On follow up 10/28/23, patient is currently in Afib. He reloaded amiodarone  after noted to being in atrial flutter last visit. No missed doses of Eliquis .   On follow up 12/07/23, patient is currently in NSR. He presented for cardioversion on 8/15 but procedure was canceled due to patient being in normal rhythm. He is taking amiodarone  200 mg daily. No missed doses of Eliquis .   Today, he denies symptoms of palpitations, chest pain, shortness of breath, orthopnea, PND, lower extremity edema, dizziness, syncope, snoring, daytime somnolence, bleeding, or neurologic sequela. The patient is tolerating medications without difficulties and is otherwise without complaint today.    he has a BMI of Body mass index is 25.78 kg/m.SABRA Filed Weights   12/07/23 1347  Weight: 79.2 kg      Current Outpatient Medications  Medication Sig Dispense Refill   acetaminophen  (TYLENOL ) 500 MG tablet Take 1,000 mg by mouth as needed for mild pain (pain score 1-3) or moderate pain (pain score 4-6).     amiodarone  (PACERONE ) 200 MG tablet  Take 1 tablet (200 mg total) by mouth 2 (two) times daily for 30 days, THEN 1 tablet (200 mg total) daily. 90 tablet 1   B Complex-C-Folic Acid (RENAL-VITE) 0.8 MG TABS Take 1 tablet by mouth every morning.     ELIQUIS  5 MG TABS tablet TAKE 1 TABLET(5 MG) BY MOUTH TWICE DAILY 60 tablet 1   Multiple Vitamin (MULTIVITAMIN WITH MINERALS) TABS tablet Take 1 tablet by mouth daily.     Omega-3 Fatty Acids (FISH OIL PO) Take 500 mg by mouth every morning.     pantoprazole  (PROTONIX ) 40 MG tablet TAKE 1 TABLET(40 MG) BY MOUTH DAILY 90 tablet 1   PARoxetine  (PAXIL ) 40 MG tablet TAKE 1 TABLET BY MOUTH EVERY DAY 90 tablet 0   SENSIPAR 30 MG tablet Take 30 mg by mouth every Monday, Wednesday, and Friday with hemodialysis. Taking 1 tablet by mouth three times weekly with meals     tamsulosin  (FLOMAX ) 0.4 MG CAPS capsule Take 0.4 mg by mouth daily.     VELPHORO 500 MG chewable tablet Chew 500 mg by mouth 3 (three) times daily with meals.     Current Facility-Administered Medications  Medication Dose Route Frequency Provider Last Rate Last Admin   0.9 %  sodium chloride  infusion  500 mL Intravenous Once Abran Rush SAILOR, MD       0.9 %  sodium chloride  infusion  500 mL Intravenous Continuous Abran Rush SAILOR, MD        Atrial Fibrillation Management history:  Previous antiarrhythmic drugs: amiodarone   Previous cardioversions: 05/27/23 Previous ablations: none Anticoagulation history: Eliquis    ROS- All systems are reviewed and negative except as per the HPI above.  Physical Exam: BP 110/66   Pulse (!) 58   Ht 5' 9 (1.753 m)   Wt 79.2 kg   BMI 25.78 kg/m   GEN- The patient is well appearing, alert and oriented x 3 today.   Neck - no JVD or carotid bruit noted Lungs- Clear to ausculation bilaterally, normal work of breathing Heart- Regular rate and rhythm, no murmurs, rubs or gallops, PMI not laterally displaced Extremities- no clubbing, cyanosis, or edema Skin - no rash or ecchymosis noted   EKG  today demonstrates  Vent. rate 58 BPM PR interval 192 ms QRS duration 94 ms QT/QTcB 462/453 ms P-R-T axes 58 -8 70 Sinus bradycardia Otherwise normal ECG When compared with ECG of 04-Nov-2023 07:27, No significant change was found  Echo 07/07/21 demonstrated   1. Left ventricular ejection fraction, by estimation, is 60 to 65%. The  left ventricle has normal function. The left ventricle has no regional  wall motion abnormalities. Left ventricular diastolic parameters are  consistent with Grade I diastolic  dysfunction (impaired relaxation).   2. Right ventricular systolic function is normal. The right ventricular  size is normal.   3. Left atrial size was mild to moderately dilated.   4. The mitral valve is grossly normal. Mild mitral valve regurgitation.  No evidence of mitral stenosis.   5. The aortic valve is tricuspid. Aortic valve regurgitation is mild. No  aortic stenosis is present.    ASSESSMENT & PLAN CHA2DS2-VASc Score = 2  The patient's score is based upon: CHF History: 0 HTN History: 1 Diabetes History: 0 Stroke History: 0 Vascular Disease History: 0 Age Score: 1 Gender Score: 0      ASSESSMENT AND PLAN: Persistent Atrial Fibrillation (ICD10:  I48.0) The patient's CHA2DS2-VASc score is 2, indicating a 2.2% annual risk of stroke.   S/p successful DCCV on 05/27/23.  Patient is currently in NSR. Presented on 8/15 for cardioversion but procedure was canceled due to patient being in normal rhythm. We discussed how patient feels back in normal rhythm and he states he does note an improvement. We discussed the long term continuation of AAD therapy versus the potential candidacy for ablation procedure. I emphasized to patient there is no guarantee he would be considered candidate for ablation due to ESRD. Considering patient is 67 and has already had ERAF on amiodarone , will refer to EP to discuss if possibly an ablation candidate or only amiodarone  going forward.     High risk medication monitoring (ICD10: [id number]) Patient requires ongoing monitoring for anti-arrhythmic medication which has the potential to cause life threatening arrhythmias or AV block. Qtc stable. Continue amiodarone  200 mg daily.   Secondary Hypercoagulable State (ICD10:  D68.69) The patient is at significant risk for stroke/thromboembolism based upon his CHA2DS2-VASc Score of 2.  Continue Apixaban  (Eliquis ).  No missed doses of Eliquis .     Refer to EP to discuss if ablation candidate.    Terra Pac, PA-C  [facility] Clinic [facility] [address] [place], [place] [address] [phone]

## 2023-12-08 DIAGNOSIS — N186 End stage renal disease: Secondary | ICD-10-CM | POA: Diagnosis not present

## 2023-12-08 DIAGNOSIS — Z992 Dependence on renal dialysis: Secondary | ICD-10-CM | POA: Diagnosis not present

## 2023-12-08 DIAGNOSIS — N2581 Secondary hyperparathyroidism of renal origin: Secondary | ICD-10-CM | POA: Diagnosis not present

## 2023-12-10 DIAGNOSIS — N2581 Secondary hyperparathyroidism of renal origin: Secondary | ICD-10-CM | POA: Diagnosis not present

## 2023-12-10 DIAGNOSIS — N186 End stage renal disease: Secondary | ICD-10-CM | POA: Diagnosis not present

## 2023-12-10 DIAGNOSIS — Z992 Dependence on renal dialysis: Secondary | ICD-10-CM | POA: Diagnosis not present

## 2023-12-13 DIAGNOSIS — N2581 Secondary hyperparathyroidism of renal origin: Secondary | ICD-10-CM | POA: Diagnosis not present

## 2023-12-13 DIAGNOSIS — N186 End stage renal disease: Secondary | ICD-10-CM | POA: Diagnosis not present

## 2023-12-13 DIAGNOSIS — Z992 Dependence on renal dialysis: Secondary | ICD-10-CM | POA: Diagnosis not present

## 2023-12-15 DIAGNOSIS — N2581 Secondary hyperparathyroidism of renal origin: Secondary | ICD-10-CM | POA: Diagnosis not present

## 2023-12-15 DIAGNOSIS — N186 End stage renal disease: Secondary | ICD-10-CM | POA: Diagnosis not present

## 2023-12-15 DIAGNOSIS — Z992 Dependence on renal dialysis: Secondary | ICD-10-CM | POA: Diagnosis not present

## 2023-12-17 DIAGNOSIS — Z992 Dependence on renal dialysis: Secondary | ICD-10-CM | POA: Diagnosis not present

## 2023-12-17 DIAGNOSIS — N186 End stage renal disease: Secondary | ICD-10-CM | POA: Diagnosis not present

## 2023-12-17 DIAGNOSIS — N2581 Secondary hyperparathyroidism of renal origin: Secondary | ICD-10-CM | POA: Diagnosis not present

## 2023-12-19 DIAGNOSIS — Z992 Dependence on renal dialysis: Secondary | ICD-10-CM | POA: Diagnosis not present

## 2023-12-19 DIAGNOSIS — N186 End stage renal disease: Secondary | ICD-10-CM | POA: Diagnosis not present

## 2023-12-19 DIAGNOSIS — N2581 Secondary hyperparathyroidism of renal origin: Secondary | ICD-10-CM | POA: Diagnosis not present

## 2023-12-20 DIAGNOSIS — I129 Hypertensive chronic kidney disease with stage 1 through stage 4 chronic kidney disease, or unspecified chronic kidney disease: Secondary | ICD-10-CM | POA: Diagnosis not present

## 2023-12-20 DIAGNOSIS — N186 End stage renal disease: Secondary | ICD-10-CM | POA: Diagnosis not present

## 2023-12-20 DIAGNOSIS — N2581 Secondary hyperparathyroidism of renal origin: Secondary | ICD-10-CM | POA: Diagnosis not present

## 2023-12-20 DIAGNOSIS — Z992 Dependence on renal dialysis: Secondary | ICD-10-CM | POA: Diagnosis not present

## 2023-12-21 ENCOUNTER — Other Ambulatory Visit: Payer: Self-pay | Admitting: Emergency Medicine

## 2023-12-22 DIAGNOSIS — Z992 Dependence on renal dialysis: Secondary | ICD-10-CM | POA: Diagnosis not present

## 2023-12-22 DIAGNOSIS — N2581 Secondary hyperparathyroidism of renal origin: Secondary | ICD-10-CM | POA: Diagnosis not present

## 2023-12-27 DIAGNOSIS — N186 End stage renal disease: Secondary | ICD-10-CM | POA: Diagnosis not present

## 2023-12-29 DIAGNOSIS — N186 End stage renal disease: Secondary | ICD-10-CM | POA: Diagnosis not present

## 2023-12-29 DIAGNOSIS — Z992 Dependence on renal dialysis: Secondary | ICD-10-CM | POA: Diagnosis not present

## 2023-12-29 DIAGNOSIS — N2581 Secondary hyperparathyroidism of renal origin: Secondary | ICD-10-CM | POA: Diagnosis not present

## 2023-12-31 DIAGNOSIS — Z992 Dependence on renal dialysis: Secondary | ICD-10-CM | POA: Diagnosis not present

## 2024-01-07 DIAGNOSIS — N186 End stage renal disease: Secondary | ICD-10-CM | POA: Diagnosis not present

## 2024-01-07 DIAGNOSIS — N2581 Secondary hyperparathyroidism of renal origin: Secondary | ICD-10-CM | POA: Diagnosis not present

## 2024-01-07 DIAGNOSIS — Z992 Dependence on renal dialysis: Secondary | ICD-10-CM | POA: Diagnosis not present

## 2024-01-10 ENCOUNTER — Ambulatory Visit: Admitting: Cardiology

## 2024-01-10 DIAGNOSIS — N186 End stage renal disease: Secondary | ICD-10-CM | POA: Diagnosis not present

## 2024-01-10 DIAGNOSIS — Z992 Dependence on renal dialysis: Secondary | ICD-10-CM | POA: Diagnosis not present

## 2024-01-10 DIAGNOSIS — N2581 Secondary hyperparathyroidism of renal origin: Secondary | ICD-10-CM | POA: Diagnosis not present

## 2024-01-14 DIAGNOSIS — N186 End stage renal disease: Secondary | ICD-10-CM | POA: Diagnosis not present

## 2024-01-14 DIAGNOSIS — N2581 Secondary hyperparathyroidism of renal origin: Secondary | ICD-10-CM | POA: Diagnosis not present

## 2024-01-14 DIAGNOSIS — Z992 Dependence on renal dialysis: Secondary | ICD-10-CM | POA: Diagnosis not present

## 2024-01-17 ENCOUNTER — Ambulatory Visit: Attending: Cardiology | Admitting: Cardiology

## 2024-01-17 DIAGNOSIS — N2581 Secondary hyperparathyroidism of renal origin: Secondary | ICD-10-CM | POA: Diagnosis not present

## 2024-01-17 DIAGNOSIS — Z992 Dependence on renal dialysis: Secondary | ICD-10-CM | POA: Diagnosis not present

## 2024-01-18 ENCOUNTER — Encounter: Payer: Self-pay | Admitting: Cardiology

## 2024-01-20 DIAGNOSIS — N186 End stage renal disease: Secondary | ICD-10-CM | POA: Diagnosis not present

## 2024-01-20 DIAGNOSIS — Z992 Dependence on renal dialysis: Secondary | ICD-10-CM | POA: Diagnosis not present

## 2024-01-20 DIAGNOSIS — I129 Hypertensive chronic kidney disease with stage 1 through stage 4 chronic kidney disease, or unspecified chronic kidney disease: Secondary | ICD-10-CM | POA: Diagnosis not present

## 2024-01-21 DIAGNOSIS — N2581 Secondary hyperparathyroidism of renal origin: Secondary | ICD-10-CM | POA: Diagnosis not present

## 2024-01-21 DIAGNOSIS — Z992 Dependence on renal dialysis: Secondary | ICD-10-CM | POA: Diagnosis not present

## 2024-01-26 DIAGNOSIS — N186 End stage renal disease: Secondary | ICD-10-CM | POA: Diagnosis not present

## 2024-01-26 DIAGNOSIS — N2581 Secondary hyperparathyroidism of renal origin: Secondary | ICD-10-CM | POA: Diagnosis not present

## 2024-01-26 DIAGNOSIS — Z992 Dependence on renal dialysis: Secondary | ICD-10-CM | POA: Diagnosis not present

## 2024-01-31 DIAGNOSIS — N2581 Secondary hyperparathyroidism of renal origin: Secondary | ICD-10-CM | POA: Diagnosis not present

## 2024-01-31 DIAGNOSIS — Z992 Dependence on renal dialysis: Secondary | ICD-10-CM | POA: Diagnosis not present

## 2024-01-31 DIAGNOSIS — N186 End stage renal disease: Secondary | ICD-10-CM | POA: Diagnosis not present

## 2024-02-01 ENCOUNTER — Other Ambulatory Visit (HOSPITAL_COMMUNITY): Payer: Self-pay | Admitting: Internal Medicine

## 2024-02-07 DIAGNOSIS — N186 End stage renal disease: Secondary | ICD-10-CM | POA: Diagnosis not present

## 2024-02-09 DIAGNOSIS — N2581 Secondary hyperparathyroidism of renal origin: Secondary | ICD-10-CM | POA: Diagnosis not present

## 2024-02-09 DIAGNOSIS — Z992 Dependence on renal dialysis: Secondary | ICD-10-CM | POA: Diagnosis not present

## 2024-02-11 DIAGNOSIS — Z992 Dependence on renal dialysis: Secondary | ICD-10-CM | POA: Diagnosis not present

## 2024-02-11 DIAGNOSIS — N2581 Secondary hyperparathyroidism of renal origin: Secondary | ICD-10-CM | POA: Diagnosis not present

## 2024-02-13 DIAGNOSIS — N186 End stage renal disease: Secondary | ICD-10-CM | POA: Diagnosis not present

## 2024-02-18 DIAGNOSIS — N186 End stage renal disease: Secondary | ICD-10-CM | POA: Diagnosis not present

## 2024-02-18 DIAGNOSIS — Z992 Dependence on renal dialysis: Secondary | ICD-10-CM | POA: Diagnosis not present

## 2024-02-18 DIAGNOSIS — N2581 Secondary hyperparathyroidism of renal origin: Secondary | ICD-10-CM | POA: Diagnosis not present

## 2024-02-19 ENCOUNTER — Other Ambulatory Visit: Payer: Self-pay | Admitting: Emergency Medicine

## 2024-02-19 DIAGNOSIS — N186 End stage renal disease: Secondary | ICD-10-CM | POA: Diagnosis not present

## 2024-02-19 DIAGNOSIS — Z992 Dependence on renal dialysis: Secondary | ICD-10-CM | POA: Diagnosis not present

## 2024-02-19 DIAGNOSIS — I129 Hypertensive chronic kidney disease with stage 1 through stage 4 chronic kidney disease, or unspecified chronic kidney disease: Secondary | ICD-10-CM | POA: Diagnosis not present

## 2024-03-25 ENCOUNTER — Inpatient Hospital Stay (HOSPITAL_COMMUNITY)

## 2024-03-25 ENCOUNTER — Emergency Department (HOSPITAL_COMMUNITY)

## 2024-03-25 ENCOUNTER — Inpatient Hospital Stay (HOSPITAL_COMMUNITY)
Admission: EM | Admit: 2024-03-25 | Discharge: 2024-04-03 | DRG: 480 | Disposition: A | Discharge status: Skilled Nursing Facility | Attending: Internal Medicine | Admitting: Internal Medicine

## 2024-03-25 ENCOUNTER — Encounter (HOSPITAL_COMMUNITY): Payer: Self-pay | Admitting: Family Medicine

## 2024-03-25 ENCOUNTER — Other Ambulatory Visit: Payer: Self-pay

## 2024-03-25 DIAGNOSIS — Z7901 Long term (current) use of anticoagulants: Secondary | ICD-10-CM

## 2024-03-25 DIAGNOSIS — I4891 Unspecified atrial fibrillation: Secondary | ICD-10-CM | POA: Diagnosis present

## 2024-03-25 DIAGNOSIS — I120 Hypertensive chronic kidney disease with stage 5 chronic kidney disease or end stage renal disease: Secondary | ICD-10-CM | POA: Diagnosis present

## 2024-03-25 DIAGNOSIS — F32A Depression, unspecified: Secondary | ICD-10-CM | POA: Diagnosis present

## 2024-03-25 DIAGNOSIS — Z79899 Other long term (current) drug therapy: Secondary | ICD-10-CM

## 2024-03-25 DIAGNOSIS — Z8551 Personal history of malignant neoplasm of bladder: Secondary | ICD-10-CM | POA: Diagnosis not present

## 2024-03-25 DIAGNOSIS — Z800 Family history of malignant neoplasm of digestive organs: Secondary | ICD-10-CM | POA: Diagnosis not present

## 2024-03-25 DIAGNOSIS — S72112A Displaced fracture of greater trochanter of left femur, initial encounter for closed fracture: Principal | ICD-10-CM | POA: Diagnosis present

## 2024-03-25 DIAGNOSIS — Z818 Family history of other mental and behavioral disorders: Secondary | ICD-10-CM | POA: Diagnosis not present

## 2024-03-25 DIAGNOSIS — Z87891 Personal history of nicotine dependence: Secondary | ICD-10-CM

## 2024-03-25 DIAGNOSIS — Z992 Dependence on renal dialysis: Secondary | ICD-10-CM | POA: Diagnosis not present

## 2024-03-25 DIAGNOSIS — I10 Essential (primary) hypertension: Secondary | ICD-10-CM | POA: Diagnosis present

## 2024-03-25 DIAGNOSIS — C679 Malignant neoplasm of bladder, unspecified: Secondary | ICD-10-CM | POA: Diagnosis present

## 2024-03-25 DIAGNOSIS — E875 Hyperkalemia: Secondary | ICD-10-CM | POA: Diagnosis present

## 2024-03-25 DIAGNOSIS — S72002A Fracture of unspecified part of neck of left femur, initial encounter for closed fracture: Secondary | ICD-10-CM | POA: Diagnosis present

## 2024-03-25 DIAGNOSIS — W19XXXA Unspecified fall, initial encounter: Principal | ICD-10-CM

## 2024-03-25 DIAGNOSIS — S72145A Nondisplaced intertrochanteric fracture of left femur, initial encounter for closed fracture: Secondary | ICD-10-CM | POA: Diagnosis not present

## 2024-03-25 DIAGNOSIS — Z751 Person awaiting admission to adequate facility elsewhere: Secondary | ICD-10-CM

## 2024-03-25 DIAGNOSIS — I4819 Other persistent atrial fibrillation: Secondary | ICD-10-CM | POA: Diagnosis present

## 2024-03-25 DIAGNOSIS — Z8249 Family history of ischemic heart disease and other diseases of the circulatory system: Secondary | ICD-10-CM | POA: Diagnosis not present

## 2024-03-25 DIAGNOSIS — N186 End stage renal disease: Secondary | ICD-10-CM | POA: Diagnosis present

## 2024-03-25 DIAGNOSIS — W010XXA Fall on same level from slipping, tripping and stumbling without subsequent striking against object, initial encounter: Secondary | ICD-10-CM | POA: Diagnosis present

## 2024-03-25 DIAGNOSIS — Y92002 Bathroom of unspecified non-institutional (private) residence single-family (private) house as the place of occurrence of the external cause: Secondary | ICD-10-CM

## 2024-03-25 DIAGNOSIS — K219 Gastro-esophageal reflux disease without esophagitis: Secondary | ICD-10-CM | POA: Diagnosis present

## 2024-03-25 DIAGNOSIS — Z833 Family history of diabetes mellitus: Secondary | ICD-10-CM | POA: Diagnosis not present

## 2024-03-25 DIAGNOSIS — D631 Anemia in chronic kidney disease: Secondary | ICD-10-CM | POA: Diagnosis present

## 2024-03-25 LAB — CBC WITH DIFFERENTIAL/PLATELET
Abs Immature Granulocytes: 0.04 K/uL (ref 0.00–0.07)
Basophils Absolute: 0 K/uL (ref 0.0–0.1)
Basophils Relative: 0 %
Eosinophils Absolute: 0.1 K/uL (ref 0.0–0.5)
Eosinophils Relative: 1 %
HCT: 47.5 % (ref 39.0–52.0)
Hemoglobin: 15.6 g/dL (ref 13.0–17.0)
Immature Granulocytes: 0 %
Lymphocytes Relative: 10 %
Lymphs Abs: 1 K/uL (ref 0.7–4.0)
MCH: 30.3 pg (ref 26.0–34.0)
MCHC: 32.8 g/dL (ref 30.0–36.0)
MCV: 92.2 fL (ref 80.0–100.0)
Monocytes Absolute: 1 K/uL (ref 0.1–1.0)
Monocytes Relative: 10 %
Neutro Abs: 8.2 K/uL — ABNORMAL HIGH (ref 1.7–7.7)
Neutrophils Relative %: 79 %
Platelets: 264 K/uL (ref 150–400)
RBC: 5.15 MIL/uL (ref 4.22–5.81)
RDW: 15.5 % (ref 11.5–15.5)
WBC: 10.4 K/uL (ref 4.0–10.5)
nRBC: 0 % (ref 0.0–0.2)

## 2024-03-25 LAB — BASIC METABOLIC PANEL WITH GFR
Anion gap: 16 — ABNORMAL HIGH (ref 5–15)
BUN: 38 mg/dL — ABNORMAL HIGH (ref 8–23)
CO2: 25 mmol/L (ref 22–32)
Calcium: 10.2 mg/dL (ref 8.9–10.3)
Chloride: 98 mmol/L (ref 98–111)
Creatinine, Ser: 6.05 mg/dL — ABNORMAL HIGH (ref 0.61–1.24)
GFR, Estimated: 9 mL/min — ABNORMAL LOW
Glucose, Bld: 90 mg/dL (ref 70–99)
Potassium: 4.3 mmol/L (ref 3.5–5.1)
Sodium: 138 mmol/L (ref 135–145)

## 2024-03-25 LAB — PROTIME-INR
INR: 1.1 (ref 0.8–1.2)
Prothrombin Time: 15.2 s (ref 11.4–15.2)

## 2024-03-25 LAB — PHOSPHORUS: Phosphorus: 2.9 mg/dL (ref 2.5–4.6)

## 2024-03-25 LAB — MAGNESIUM: Magnesium: 2.1 mg/dL (ref 1.7–2.4)

## 2024-03-25 MED ORDER — HEPARIN SODIUM (PORCINE) 5000 UNIT/ML IJ SOLN
5000.0000 [IU] | Freq: Three times a day (TID) | INTRAMUSCULAR | Status: DC
  Administered 2024-03-26 – 2024-03-28 (×4): 5000 [IU] via SUBCUTANEOUS
  Filled 2024-03-25 (×4): qty 1

## 2024-03-25 MED ORDER — AMIODARONE HCL 200 MG PO TABS
200.0000 mg | ORAL_TABLET | Freq: Every day | ORAL | Status: DC
  Administered 2024-03-25 – 2024-04-03 (×9): 200 mg via ORAL
  Filled 2024-03-25 (×9): qty 1

## 2024-03-25 MED ORDER — FENTANYL CITRATE (PF) 50 MCG/ML IJ SOSY
100.0000 ug | PREFILLED_SYRINGE | Freq: Once | INTRAMUSCULAR | Status: AC
  Administered 2024-03-25: 100 ug via INTRAVENOUS
  Filled 2024-03-25: qty 2

## 2024-03-25 MED ORDER — SODIUM CHLORIDE 0.9 % IV SOLN: INTRAVENOUS | Status: AC

## 2024-03-25 MED ORDER — OXYCODONE HCL 5 MG PO TABS
5.0000 mg | ORAL_TABLET | ORAL | Status: DC | PRN
  Administered 2024-03-25 – 2024-04-03 (×17): 5 mg via ORAL
  Filled 2024-03-25 (×19): qty 1

## 2024-03-25 MED ORDER — HYDROMORPHONE HCL 1 MG/ML IJ SOLN
1.0000 mg | INTRAMUSCULAR | Status: DC | PRN
  Administered 2024-03-25 – 2024-04-02 (×4): 1 mg via INTRAVENOUS
  Filled 2024-03-25 (×5): qty 1

## 2024-03-25 MED ORDER — SENNOSIDES-DOCUSATE SODIUM 8.6-50 MG PO TABS: 1.0000 | ORAL_TABLET | Freq: Every evening | ORAL | Status: DC | PRN

## 2024-03-25 MED ORDER — ACETAMINOPHEN 500 MG PO TABS
1000.0000 mg | ORAL_TABLET | Freq: Three times a day (TID) | ORAL | Status: DC
  Administered 2024-03-25 – 2024-04-03 (×25): 1000 mg via ORAL
  Filled 2024-03-25 (×25): qty 2

## 2024-03-25 MED ORDER — SODIUM CHLORIDE 0.9% FLUSH
3.0000 mL | Freq: Two times a day (BID) | INTRAVENOUS | Status: DC
  Administered 2024-03-25 – 2024-04-02 (×12): 3 mL via INTRAVENOUS

## 2024-03-25 MED ORDER — TRAZODONE HCL 50 MG PO TABS
25.0000 mg | ORAL_TABLET | Freq: Every evening | ORAL | Status: DC | PRN
  Administered 2024-03-25 – 2024-04-02 (×4): 25 mg via ORAL
  Filled 2024-03-25 (×5): qty 1

## 2024-03-25 MED ORDER — TAMSULOSIN HCL 0.4 MG PO CAPS
0.4000 mg | ORAL_CAPSULE | Freq: Every day | ORAL | Status: DC
  Administered 2024-03-25 – 2024-04-03 (×9): 0.4 mg via ORAL
  Filled 2024-03-25 (×9): qty 1

## 2024-03-25 MED ORDER — HYDROMORPHONE HCL 1 MG/ML IJ SOLN: 0.5000 mg | INTRAMUSCULAR | Status: DC | PRN

## 2024-03-25 MED ORDER — BISACODYL 5 MG PO TBEC: 5.0000 mg | DELAYED_RELEASE_TABLET | Freq: Every day | ORAL | Status: DC | PRN

## 2024-03-25 MED ORDER — ONDANSETRON HCL 4 MG PO TABS: 4.0000 mg | ORAL_TABLET | Freq: Four times a day (QID) | ORAL | Status: DC | PRN

## 2024-03-25 MED ORDER — PNEUMOCOCCAL 20-VAL CONJ VACC 0.5 ML IM SUSY
0.5000 mL | PREFILLED_SYRINGE | INTRAMUSCULAR | Status: DC
  Filled 2024-03-25: qty 0.5

## 2024-03-25 MED ORDER — FLEET ENEMA RE ENEM: 1.0000 | ENEMA | Freq: Once | RECTAL | Status: DC | PRN

## 2024-03-25 MED ORDER — HYDRALAZINE HCL 20 MG/ML IJ SOLN: 10.0000 mg | INTRAMUSCULAR | Status: DC | PRN

## 2024-03-25 MED ORDER — SODIUM CHLORIDE 0.9% FLUSH
3.0000 mL | Freq: Two times a day (BID) | INTRAVENOUS | Status: DC
  Administered 2024-03-25 – 2024-04-02 (×16): 3 mL via INTRAVENOUS

## 2024-03-25 MED ORDER — ACETAMINOPHEN 325 MG PO TABS
650.0000 mg | ORAL_TABLET | Freq: Four times a day (QID) | ORAL | Status: DC | PRN
  Administered 2024-03-25: 650 mg via ORAL
  Filled 2024-03-25 (×2): qty 2

## 2024-03-25 MED ORDER — PANTOPRAZOLE SODIUM 40 MG PO TBEC
40.0000 mg | DELAYED_RELEASE_TABLET | Freq: Every day | ORAL | Status: DC
  Administered 2024-03-25 – 2024-04-03 (×9): 40 mg via ORAL
  Filled 2024-03-25 (×9): qty 1

## 2024-03-25 MED ORDER — FENTANYL CITRATE (PF) 50 MCG/ML IJ SOSY
100.0000 ug | PREFILLED_SYRINGE | INTRAMUSCULAR | Status: DC | PRN
  Filled 2024-03-25: qty 2

## 2024-03-25 MED ORDER — SENNOSIDES-DOCUSATE SODIUM 8.6-50 MG PO TABS
1.0000 | ORAL_TABLET | Freq: Every day | ORAL | Status: DC
  Administered 2024-03-25 – 2024-04-02 (×9): 1 via ORAL
  Filled 2024-03-25 (×8): qty 1

## 2024-03-25 MED ORDER — PAROXETINE HCL 20 MG PO TABS
40.0000 mg | ORAL_TABLET | Freq: Every day | ORAL | Status: DC
  Administered 2024-03-25 – 2024-04-03 (×9): 40 mg via ORAL
  Filled 2024-03-25 (×9): qty 2

## 2024-03-25 MED ORDER — ONDANSETRON HCL 4 MG/2ML IJ SOLN: 4.0000 mg | Freq: Four times a day (QID) | INTRAMUSCULAR | Status: DC | PRN

## 2024-03-25 MED ORDER — IPRATROPIUM BROMIDE 0.02 % IN SOLN: 0.5000 mg | Freq: Four times a day (QID) | RESPIRATORY_TRACT | Status: DC | PRN

## 2024-03-25 MED ORDER — ACETAMINOPHEN 650 MG RE SUPP: 650.0000 mg | Freq: Four times a day (QID) | RECTAL | Status: DC | PRN

## 2024-03-25 MED ORDER — CINACALCET HCL 30 MG PO TABS
30.0000 mg | ORAL_TABLET | ORAL | Status: DC
  Administered 2024-03-28 – 2024-03-30 (×2): 30 mg via ORAL
  Filled 2024-03-25 (×4): qty 1

## 2024-03-25 MED ORDER — SUCROFERRIC OXYHYDROXIDE 500 MG PO CHEW
500.0000 mg | CHEWABLE_TABLET | Freq: Three times a day (TID) | ORAL | Status: DC
  Administered 2024-03-26 – 2024-04-03 (×19): 500 mg via ORAL
  Filled 2024-03-25 (×28): qty 1

## 2024-03-25 NOTE — ED Provider Notes (Signed)
 " [place] EMERGENCY DEPARTMENT AT [facility] Provider Note   CSN: [id number] Arrival date & time: 03/25/24  1145     Patient presents with: Christopher [name] is a 74 y.o. male.   HPI Patient presents for left hip pain.  Medical history includes ESRD, depression, HTN, anxiety, atrial fibrillation, depression, bladder carcinoma.  He is prescribed Eliquis .  He undergoes dialysis on Tuesday, Thursday, Saturday.  He did get a full session yesterday.  Typically after his dialysis sessions, he has a drop in his blood pressure and does remain dizzy throughout the day.  This did occur yesterday.  It did not seem out of the ordinary.  Dizziness was resolved this morning.  Prior to arrival, patient was walking in his home.  He had a mechanical fall due to slipping on a dog bed.  When he landed, he struck his left hip.  He has since had severe pain to his left hip.  Pain is more in the posterior aspect and does radiate down the side of his leg.  He does remember the fall denies striking his head.  He denies any areas of pain other than his proximal left leg.  He arrives via EMS.  He did receive 100 mcg of fentanyl  prior to arrival.    Prior to Admission medications  Medication Sig Start Date End Date Taking? Authorizing Provider  acetaminophen  (TYLENOL ) 500 MG tablet Take 1,000 mg by mouth as needed for mild pain (pain score 1-3) or moderate pain (pain score 4-6).    [provider]  amiodarone  (PACERONE ) 200 MG tablet Take 1 tablet (200 mg total) by mouth daily. 02/02/24   Terra Fairy PARAS, PA-C  apixaban  (ELIQUIS ) 5 MG TABS tablet Take 1 tablet (5 mg total) by mouth 2 (two) times daily. 12/07/23   Terra Fairy PARAS, PA-C  B Complex-C-Folic Acid (RENAL-VITE) 0.8 MG TABS Take 1 tablet by mouth every morning. 09/15/23   [provider]  Multiple Vitamin (MULTIVITAMIN WITH MINERALS) TABS tablet Take 1 tablet by mouth daily.    [provider]  Omega-3 Fatty Acids  (FISH OIL PO) Take 500 mg by mouth every morning.    [provider]  pantoprazole  (PROTONIX ) 40 MG tablet TAKE 1 TABLET(40 MG) BY MOUTH DAILY 11/14/23   Purcell Emil Schanz, MD  PARoxetine  (PAXIL ) 40 MG tablet TAKE 1 TABLET BY MOUTH EVERY DAY 02/19/24   Purcell Emil Schanz, MD  SENSIPAR  30 MG tablet Take 30 mg by mouth every Monday, Wednesday, and Friday with hemodialysis. Taking 1 tablet by mouth three times weekly with meals 07/19/23   [provider]  tamsulosin  (FLOMAX ) 0.4 MG CAPS capsule Take 0.4 mg by mouth daily. 04/28/21   [provider]  VELPHORO  500 MG chewable tablet Chew 500 mg by mouth 3 (three) times daily with meals. 08/18/23   [provider]    Allergies: Patient has no known allergies.    Review of Systems  Musculoskeletal:  Positive for arthralgias.  All other systems reviewed and are negative.   Updated Vital Signs BP 136/79 (BP Location: Left Arm)   Pulse (!) 57   Temp 97.7 F (36.5 C) (Oral)   Resp 14   SpO2 100%   Physical Exam Vitals and nursing note reviewed.  Constitutional:      General: He is not in acute distress.    Appearance: Normal appearance. He is well-developed. He is not ill-appearing, toxic-appearing or diaphoretic.  HENT:  Head: Normocephalic and atraumatic.     Right Ear: External ear normal.     Left Ear: External ear normal.     Nose: Nose normal.     Mouth/Throat:     Mouth: Mucous membranes are moist.  Eyes:     Extraocular Movements: Extraocular movements intact.     Conjunctiva/sclera: Conjunctivae normal.  Cardiovascular:     Rate and Rhythm: Normal rate and regular rhythm.  Pulmonary:     Effort: Pulmonary effort is normal. No respiratory distress.  Chest:     Chest wall: No tenderness.  Abdominal:     General: There is no distension.     Palpations: Abdomen is soft.     Tenderness: There is no abdominal tenderness.  Musculoskeletal:        General: Signs of injury present. No  swelling.     Cervical back: Normal range of motion and neck supple.  Skin:    General: Skin is warm and dry.     Coloration: Skin is not jaundiced or pale.  Neurological:     General: No focal deficit present.     Mental Status: He is alert and oriented to person, place, and time.     Cranial Nerves: No cranial nerve deficit.     Sensory: No sensory deficit.     Motor: No weakness.     Coordination: Coordination normal.  Psychiatric:        Mood and Affect: Mood normal.        Behavior: Behavior normal.     (all labs ordered are listed, but only abnormal results are displayed) Labs Reviewed  BASIC METABOLIC PANEL WITH GFR - Abnormal; Notable for the following components:      Result Value   BUN 38 (*)    Creatinine, Ser 6.05 (*)    GFR, Estimated 9 (*)    Anion gap 16 (*)    All other components within normal limits  CBC WITH DIFFERENTIAL/PLATELET - Abnormal; Notable for the following components:   Neutro Abs 8.2 (*)    All other components within normal limits  PROTIME-INR    EKG: EKG Interpretation Date/Time:  Sunday March 25 2024 13:06:04 EST Ventricular Rate:  60 PR Interval:  194 QRS Duration:  98 QT Interval:  470 QTC Calculation: 470 R Axis:   1  Text Interpretation: Normal sinus rhythm Incomplete right bundle branch block Septal infarct , age undetermined Possible Lateral infarct , age undetermined Abnormal ECG Confirmed by Melvenia Motto [phone]) on 03/25/2024 1:16:25 PM  Radiology: DG Chest 1 View Result Date: 03/25/2024 CLINICAL DATA:  Fall EXAM: CHEST  1 VIEW COMPARISON:  July 08, 2021 FINDINGS: Stable cardiomediastinal silhouette. Minimal right midlung subsegmental atelectasis or scarring is noted. Left lung is clear. Bony thorax is unremarkable. IMPRESSION: Minimal right midlung subsegmental atelectasis or scarring. Electronically Signed   By: Lynwood Landy Raddle M.D.   On: 03/25/2024 13:30   DG Hip Unilat With Pelvis 2-3 Views Left Result Date:  03/25/2024 CLINICAL DATA:  Left hip pain after fall EXAM: DG HIP (WITH OR WITHOUT PELVIS) 2-3V LEFT COMPARISON:  None Available. FINDINGS: Minimally displaced fracture is seen involving the greater trochanter of the proximal left femur. Mild narrowing of left hip is noted. IMPRESSION: Minimally displaced greater trochanteric fracture of proximal left femur. CT scan may be performed to evaluate for more extensive fracture than visualized. Electronically Signed   By: Lynwood Landy Raddle M.D.   On: 03/25/2024 13:28  Procedures   Medications Ordered in the ED  fentaNYL  (SUBLIMAZE ) injection 100 mcg (has no administration in time range)  fentaNYL  (SUBLIMAZE ) injection 100 mcg (100 mcg Intravenous Given 03/25/24 1209)                                    Medical Decision Making Amount and/or Complexity of Data Reviewed Labs: ordered. Radiology: ordered.  Risk Prescription drug management. Decision regarding hospitalization.   This patient presents to the ED for concern of fall, this involves an extensive number of treatment options, and is a complaint that carries with it a high risk of complications and morbidity.  The differential diagnosis includes acute injuries   Co morbidities / Chronic conditions that complicate the patient evaluation  ESRD, depression, HTN, anxiety, atrial fibrillation, depression, bladder carcinoma   Additional history obtained:  Additional history obtained from EMR External records from outside source obtained and reviewed including EMS   Lab Tests:  I Ordered, and personally interpreted labs.  The pertinent results include: Elevated creatinine consistent with ESRD; normal electrolytes, normal hemoglobin, no leukocytosis   Imaging Studies ordered:  I ordered imaging studies including x-ray of chest and left hip I independently visualized and interpreted imaging which showed minimally displaced greater trochanteric fracture of proximal left femur I agree  with the radiologist interpretation   Cardiac Monitoring: / EKG:  The patient was maintained on a cardiac monitor.  I personally viewed and interpreted the cardiac monitored which showed an underlying rhythm of: Sinus rhythm   Problem List / ED Course / Critical interventions / Medication management  Patient presenting after mechanical fall.  When he fell, he did land on his left hip and has since had pain to this area.  He arrives via EMS.  On arrival, he is alert and oriented.  He denies striking his head.  He has pain in the posterior aspect of his left hip which does radiate down the side of his left thigh.  His left leg actually appears lengthened on exam.  Distal extremity is neurovascularly intact.  Additional fentanyl  was ordered.  Workup was initiated.  X-ray confirms left hip fracture.  I spoke with orthopedic surgeon on-call, Dr. Celena, who request CT scan.  This was ordered.  Patient to be made n.p.o. at midnight.  Patient to be admitted for further management. I ordered medication including fentanyl  for analgesia Reevaluation of the patient after these medicines showed that the patient improved I have reviewed the patients home medicines and have made adjustments as needed   Consultations Obtained:  I requested consultation with the orthopedic surgeon, Dr. Celena,  and discussed lab and imaging findings as well as pertinent plan - they recommend: N.p.o. at midnight, CT scan for further evaluation   Social Determinants of Health:  Lives independently     Final diagnoses:  Fall, initial encounter  Closed fracture of left hip, initial encounter [facility])    ED Discharge Orders     None          Melvenia Motto, MD 03/25/24 1400  "

## 2024-03-25 NOTE — Assessment & Plan Note (Addendum)
 Blood pressure stable, monitoring closely - As needed hydralazine  (Per wife and patient his blood pressure fluctuates quite a bit especially postdialysis)

## 2024-03-25 NOTE — Consult Note (Signed)
 "             Orthopaedic Trauma Service (OTS) Consult   Patient ID: Christopher [name] MRN: [id number] DOB/AGE: 07-26-1950 74 y.o.   Reason for Consult: Left greater trochanter fracture Referring Physician: Bernardino Fireman, MD (EDP)   HPI: Christopher [name] is an 74 y.o. male with a medical history notable for end-stage renal disease on dialysis, A-fib on Eliquis , bladder carcinoma s/p surgical intervention, depression and anxiety who sustained a fall this morning after slipping on a dog bed.  Landed directly on his left hip.  Has had pain since isolated to his left hip.  He does have some radiation down his leg.  He was brought to [facility] via EMS.  X-rays of his hip and pelvis show what appears to be a minimally displaced left greater trochanter fracture.  Orthopedics consulted for further evaluation.  Patient seen and evaluated in the emergency room.  He has isolated tenderness over his greater trochanter with some tenderness along the soft tissue of the posterior lateral aspect of his thigh.  Does not have any reports of any groin pain no knee pain no lower leg or ankle pain are noted.  Denies any numbness or tingling.  Does not use a walker or cane at baseline  Lives in a house with about 4 stairs to gain access to the house  Past Medical History:  Diagnosis Date   Anxiety    Cancer [facility])    bladder  cancer- stage 1- removed 2007   Chronic kidney disease    CKD    Depression    Hypertension     Past Surgical History:  Procedure Laterality Date   A/V FISTULAGRAM N/A 05/11/2023   Procedure: A/V Fistulagram;  Surgeon: Melia Lynwood ORN, MD;  Location: [facility] INVASIVE CV LAB;  Service: Cardiovascular;  Laterality: N/A;   AV FISTULA PLACEMENT Right 07/08/2021   Procedure: RIGHT ARM FISTULA  CREATION;  Surgeon: Serene Gaile ORN, MD;  Location: [facility] OR;  Service: Vascular;  Laterality: Right;   BIOPSY  07/11/2021   Procedure: BIOPSY;  Surgeon: Eda Iha, MD;  Location: [facility]  ENDOSCOPY;  Service: Gastroenterology;;   BLADDER SURGERY     CARDIOVERSION N/A 05/27/2023   Procedure: CARDIOVERSION;  Surgeon: Pietro Redell RAMAN, MD;  Location: [facility] INVASIVE CV LAB;  Service: Cardiovascular;  Laterality: N/A;   COLONOSCOPY     ESOPHAGOGASTRODUODENOSCOPY (EGD) WITH PROPOFOL  N/A 07/11/2021   Procedure: ESOPHAGOGASTRODUODENOSCOPY (EGD) WITH PROPOFOL ;  Surgeon: Eda Iha, MD;  Location: [facility] ENDOSCOPY;  Service: Gastroenterology;  Laterality: N/A;   INSERTION OF DIALYSIS CATHETER N/A 07/08/2021   Procedure: INSERTION OF DIALYSIS CATHETER;  Surgeon: Serene Gaile ORN, MD;  Location: [facility] OR;  Service: Vascular;  Laterality: N/A;   JOINT REPLACEMENT  2016   knee right    PERIPHERAL VASCULAR BALLOON ANGIOPLASTY  05/11/2023   Procedure: PERIPHERAL VASCULAR BALLOON ANGIOPLASTY;  Surgeon: Melia Lynwood ORN, MD;  Location: [facility] INVASIVE CV LAB;  Service: Cardiovascular;;  Cephalic Arch Stenosis   POLYPECTOMY      Family History  Problem Relation Age of Onset   Cancer Mother    Cancer Father    Hypertension Father    Heart disease Father    Stomach cancer Father    Cancer Sister    Diabetes Sister    Heart disease Sister    Hypertension Sister    Mental illness Brother    Colon cancer Neg Hx    Colon polyps Neg Hx  Esophageal cancer Neg Hx    Rectal cancer Neg Hx     Social History:  reports that he quit smoking about 52 years ago. His smoking use included cigarettes. He has never used smokeless tobacco. He reports current alcohol use. He reports that he does not use drugs.   Allergies: Allergies[1]  Medications: I have reviewed the patient's current medications. Current Outpatient Medications  Medication Instructions   acetaminophen  (TYLENOL ) 1,000 mg, As needed   amiodarone  (PACERONE ) 200 mg, Oral, Daily   apixaban  (ELIQUIS ) 5 mg, Oral, 2 times daily   B Complex-C-Folic Acid (RENAL-VITE) 0.8 MG TABS 1 tablet, Every morning   Multiple Vitamin (MULTIVITAMIN WITH  MINERALS) TABS tablet 1 tablet, Daily   Omega-3 Fatty Acids (FISH OIL PO) 500 mg, Every morning   pantoprazole  (PROTONIX ) 40 MG tablet TAKE 1 TABLET(40 MG) BY MOUTH DAILY   PARoxetine  (PAXIL ) 40 mg, Oral, Daily   Sensipar  30 mg, Every M-W-F (Hemodialysis)   tamsulosin  (FLOMAX ) 0.4 mg, Daily   Velphoro  500 mg, 3 times daily with meals        Results for orders placed or performed during the hospital encounter of 03/25/24 (from the past 48 hours)  Basic metabolic panel     Status: Abnormal   Collection Time: 03/25/24 11:56 AM  Result Value Ref Range   Sodium 138 135 - 145 mmol/L   Potassium 4.3 3.5 - 5.1 mmol/L   Chloride 98 98 - 111 mmol/L   CO2 25 22 - 32 mmol/L   Glucose, Bld 90 70 - 99 mg/dL    Comment: Glucose reference range applies only to samples taken after fasting for at least 8 hours.   BUN 38 (H) 8 - 23 mg/dL   Creatinine, Ser 3.94 (H) 0.61 - 1.24 mg/dL   Calcium 89.7 8.9 - 89.6 mg/dL   GFR, Estimated 9 (L) >60 mL/min    Comment: (NOTE) Calculated using the CKD-EPI Creatinine Equation (2021)    Anion gap 16 (H) 5 - 15    Comment: Performed at [facility], [address], [place], [place] [address]  CBC with Differential     Status: Abnormal   Collection Time: 03/25/24 11:56 AM  Result Value Ref Range   WBC 10.4 4.0 - 10.5 K/uL   RBC 5.15 4.22 - 5.81 MIL/uL   Hemoglobin 15.6 13.0 - 17.0 g/dL   HCT 52.4 60.9 - 47.9 %   MCV 92.2 80.0 - 100.0 fL   MCH 30.3 26.0 - 34.0 pg   MCHC 32.8 30.0 - 36.0 g/dL   RDW 84.4 88.4 - 84.4 %   Platelets 264 150 - 400 K/uL   nRBC 0.0 0.0 - 0.2 %   Neutrophils Relative % 79 %   Neutro Abs 8.2 (H) 1.7 - 7.7 K/uL   Lymphocytes Relative 10 %   Lymphs Abs 1.0 0.7 - 4.0 K/uL   Monocytes Relative 10 %   Monocytes Absolute 1.0 0.1 - 1.0 K/uL   Eosinophils Relative 1 %   Eosinophils Absolute 0.1 0.0 - 0.5 K/uL   Basophils Relative 0 %   Basophils Absolute 0.0 0.0 - 0.1 K/uL   Immature Granulocytes 0 %   Abs Immature  Granulocytes 0.04 0.00 - 0.07 K/uL    Comment: Performed at [facility], [address], [place], [place] [address]  Protime-INR     Status: None   Collection Time: 03/25/24 11:56 AM  Result Value Ref Range   Prothrombin Time 15.2 11.4 -  15.2 seconds   INR 1.1 0.8 - 1.2    Comment: (NOTE) INR goal varies based on device and disease states. Performed at [facility], [address], [place], [place] [address]     DG Chest 1 View Result Date: 03/25/2024 CLINICAL DATA:  Fall EXAM: CHEST  1 VIEW COMPARISON:  July 08, 2021 FINDINGS: Stable cardiomediastinal silhouette. Minimal right midlung subsegmental atelectasis or scarring is noted. Left lung is clear. Bony thorax is unremarkable. IMPRESSION: Minimal right midlung subsegmental atelectasis or scarring. Electronically Signed   By: Lynwood Landy Raddle M.D.   On: 03/25/2024 13:30   DG Hip Unilat With Pelvis 2-3 Views Left Result Date: 03/25/2024 CLINICAL DATA:  Left hip pain after fall EXAM: DG HIP (WITH OR WITHOUT PELVIS) 2-3V LEFT COMPARISON:  None Available. FINDINGS: Minimally displaced fracture is seen involving the greater trochanter of the proximal left femur. Mild narrowing of left hip is noted. IMPRESSION: Minimally displaced greater trochanteric fracture of proximal left femur. CT scan may be performed to evaluate for more extensive fracture than visualized. Electronically Signed   By: Lynwood Landy Raddle M.D.   On: 03/25/2024 13:28    Intake/Output    None      ROS L hip pain  Blood pressure 131/74, pulse 78, temperature 98.8 F (37.1 C), temperature source Oral, resp. rate 18, SpO2 100%. Physical Exam Vitals reviewed.  Constitutional:      General: He is awake. He is not in acute distress.    Appearance: Normal appearance.     Comments: Pleasant  Pulmonary:     Effort: No respiratory distress.  Musculoskeletal:     Comments:  Left lower extremity              no open wounds or lesions, no swelling or ecchymosis               TTP lateral left hip   Nontender knee, ankle and foot  No knee or ankle effusion             No pain with axial loading or logrolling of the hip.                          Some mild discomfort around the lateral hip but nothing isolated in the groin region              No pain with manipulation of the ankle or foot  Sens DPN, SPN, TN intact  Motor EHL, FHL, lesser toe motor, Ext, flex, evers 5/5  DP 2+, No significant edema but skin changes consistent with PVD noted              Compartments are soft and nontender, no pain with passive stretching     Neurological:     Mental Status: He is alert.  Psychiatric:        Behavior: Behavior is cooperative.      Assessment/Plan:  74 year old male with complex medical history s/p fall with left greater trochanter fracture   - Fall  - Left greater trochanter fracture  Will obtain CT scan to confirm that it is isolated to the greater trochanter and that there is no extension into the femoral neck   Provided that it is isolated to the greater trochanter we can manage this nonoperatively however if there is extension into the femoral neck we would recommend surgical intervention to stabilize the fracture   Weightbearing and range of  motion restrictions to be determined based off CT findings but suspect he will be weightbearing as tolerated with trochanteric precautions   Ice as needed  - Pain management:  Multimodal  - Medical issues   Per primary  - DVT/PE prophylaxis:  Will resume anticoagulation once we know definitive treatment plan based off CT  - 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 at midnight if surgery is needed  - Dispo:  CT left hip to evaluate femoral neck  Admit for pain control and therapies     Francis MICAEL Mt, PA-C [phone] (C) 03/25/2024, 3:07 PM  [facility] [address] [place]  [address] [phone] [phone] (F)    After 5pm and on the weekends please log on to Amion, go to orthopaedics and the look under the [facility] Call for the provider(s) on call. You can also call our office at [phone] and then follow the prompts to be connected to the call team.      [1] No Known Allergies  "

## 2024-03-25 NOTE — Assessment & Plan Note (Signed)
 Remote history in remission

## 2024-03-25 NOTE — Plan of Care (Signed)

## 2024-03-25 NOTE — Assessment & Plan Note (Signed)
-   Status post accidental fall, left hip pain, fracture -Left hip X-ray left hip: Minimally displaced greater trochanteric fracture of proximal left femur.  - Rec per recommendation obtaining CT scan for further detail evaluate - Orthopedic team Dr. Celena has been consulted by EDP, Patient's last oral intake of Eliquis  was on the ninth of 03/25/23 -Continue to hold Eliquis  - N.p.o. after midnight - Anticipated ORIF in a.m. 03/26/24 - As needed IV and p.o. analgesics

## 2024-03-25 NOTE — ED Triage Notes (Signed)
 PT BIB EMS from home. Mechanical fall after slipping in bathroom. Landed on left hip. Denies hitting head. Is on blood thinners. Pt complains of being dizzy more lately esp after getting dialysis. Did feel dizzy after falling. Denies any other pain at this time. A&O x 4.   EMS vitals  126/74 55 hr 100% ra 16RR 20g LAC 100 mcg fentanyl 

## 2024-03-25 NOTE — Assessment & Plan Note (Signed)
 On hemodialysis-Tuesdays, Thursdays, Saturdays -Status post hemodialysis yesterday  -Primary nephrologist Dr. Prescilla -Consulting nephrology Dr. Geralynn

## 2024-03-25 NOTE — Assessment & Plan Note (Signed)
 Mood stable, continue paroxetine 

## 2024-03-25 NOTE — Assessment & Plan Note (Signed)
 Rate well-controlled on amiodarone  -Continue p.o. amiodarone  -On Eliquis -will be on HOLD (last dose taken 9 open 03/24/2024) - Last echo April 2023, EF 60-65%, normal LV function, grade 1 diastolic dysfunction, ventricles within normal limits, valves within normal limits -

## 2024-03-25 NOTE — Hospital Course (Addendum)
 Christopher [name] is a  74 y.o Male with history of ESRD (TTS), A-fib (on Eliquis ) HTN, anxiety, depression, bladder cancer.  Status post hemodialysis yesterday 03/24/2024, was dizzy, remained dizzy throughout the day.patient was walking in his home. He had a mechanical fall due to slipping on a dog bed.  Landed and struck his left side.  Post fall was experiencing extreme left hip pain.  He recalls the fall, denies hitting his head or loss of consciousness.  Denies of having any other injuries.   ED Evaluation: Blood pressure 136/79, pulse (!) 57, temperature 97.7 F (36.5 C), temperature source Oral, resp. rate 14, SpO2 100%. LABs: CBC CMP reviewed all within normal show exception of BUN 38, creatinine 6.05 X-ray left hip: Minimally displaced greater trochanteric fracture of proximal left femur. CT scan may be performed to evaluate for more extensive fracture than visualized.

## 2024-03-25 NOTE — H&P (Signed)
 " History and Physical   Patient: Christopher [name]                            PCP: Christopher Emil Schanz, MD                    DOB: [birth year]            DOA: 03/25/2024 FMW:[id number]             DOS: 03/25/2024, 2:36 PM  Christopher Emil Schanz, MD  Patient coming from:   HOME  I have personally reviewed patient's medical records, in electronic medical records, including:  [facility], and care everywhere.    Chief Complaint:   Chief Complaint  Patient presents with   Fall    History of present illness:    Christopher [name] is a  74 y.o Male with history of ESRD (TTS), A-fib (on Eliquis ) HTN, anxiety, depression, bladder cancer.  Status post hemodialysis yesterday 03/24/2024, was dizzy, remained dizzy throughout the day.patient was walking in his home. He had a mechanical fall due to slipping on a dog bed.  Landed and struck his left side.  Post fall was experiencing extreme left hip pain.  He recalls the fall, denies hitting his head or loss of consciousness.  Denies of having any traumatic injury.   ED Evaluation: Blood pressure 136/79, pulse (!) 57, temperature 97.7 F (36.5 C), temperature source Oral, resp. rate 14, SpO2 100%. LABs: CBC CMP reviewed all within normal show exception of BUN 38, creatinine 6.05 X-ray left hip: Minimally displaced greater trochanteric fracture of proximal left femur. CT scan may be performed to evaluate for more extensive fracture than visualized.   Patient Denies having: Fever, Chills, Cough, SOB, Chest Pain, Abd pain, N/V/D, headache, dizziness, lightheadedness,  Dysuria, Joint pain, rash, open wounds     Review of Systems: As per HPI, otherwise 10 point review of systems were negative.   ----------------------------------------------------------------------------------------------------------------------  Allergies[1]  Home MEDs:  Prior to Admission medications  Medication Sig Start Date End Date Taking? Authorizing Provider   acetaminophen  (TYLENOL ) 500 MG tablet Take 1,000 mg by mouth as needed for mild pain (pain score 1-3) or moderate pain (pain score 4-6).    [provider]  amiodarone  (PACERONE ) 200 MG tablet Take 1 tablet (200 mg total) by mouth daily. 02/02/24   Terra Fairy PARAS, PA-C  apixaban  (ELIQUIS ) 5 MG TABS tablet Take 1 tablet (5 mg total) by mouth 2 (two) times daily. 12/07/23   Terra Fairy PARAS, PA-C  B Complex-C-Folic Acid (RENAL-VITE) 0.8 MG TABS Take 1 tablet by mouth every morning. 09/15/23   [provider]  Multiple Vitamin (MULTIVITAMIN WITH MINERALS) TABS tablet Take 1 tablet by mouth daily.    [provider]  Omega-3 Fatty Acids (FISH OIL PO) Take 500 mg by mouth every morning.    [provider]  pantoprazole  (PROTONIX ) 40 MG tablet TAKE 1 TABLET(40 MG) BY MOUTH DAILY 11/14/23   Christopher Emil Schanz, MD  PARoxetine  (PAXIL ) 40 MG tablet TAKE 1 TABLET BY MOUTH EVERY DAY 02/19/24   Christopher Emil Schanz, MD  SENSIPAR  30 MG tablet Take 30 mg by mouth every Monday, Wednesday, and Friday with hemodialysis. Taking 1 tablet by mouth three times weekly with meals 07/19/23   [provider]  tamsulosin  (FLOMAX ) 0.4 MG CAPS capsule Take 0.4 mg by mouth daily. 04/28/21   [provider]  VELPHORO  500 MG chewable tablet Chew 500 mg by mouth 3 (three) times daily with meals. 08/18/23   [provider]    PRN MEDs: acetaminophen  **OR** acetaminophen , bisacodyl , hydrALAZINE , HYDROmorphone  (DILAUDID ) injection, ipratropium, ondansetron  **OR** ondansetron  (ZOFRAN ) IV, oxyCODONE , traZODone   Past Medical History:  Diagnosis Date   Anxiety    Cancer (HCC)    bladder  cancer- stage 1- removed 2007   Chronic kidney disease    CKD    Depression    Hypertension     Past Surgical History:  Procedure Laterality Date   A/V FISTULAGRAM N/A 05/11/2023   Procedure: A/V Fistulagram;  Surgeon: Melia Lynwood ORN, MD;  Location: [facility] INVASIVE [facility];  Service:  Cardiovascular;  Laterality: N/A;   AV FISTULA PLACEMENT Right 07/08/2021   Procedure: RIGHT ARM FISTULA  CREATION;  Surgeon: Serene Gaile ORN, MD;  Location: [facility] OR;  Service: Vascular;  Laterality: Right;   BIOPSY  07/11/2021   Procedure: BIOPSY;  Surgeon: Eda Iha, MD;  Location: [facility] ENDOSCOPY;  Service: Gastroenterology;;   BLADDER SURGERY     CARDIOVERSION N/A 05/27/2023   Procedure: CARDIOVERSION;  Surgeon: Pietro Redell RAMAN, MD;  Location: [facility] INVASIVE CV LAB;  Service: Cardiovascular;  Laterality: N/A;   COLONOSCOPY     ESOPHAGOGASTRODUODENOSCOPY (EGD) WITH PROPOFOL  N/A 07/11/2021   Procedure: ESOPHAGOGASTRODUODENOSCOPY (EGD) WITH PROPOFOL ;  Surgeon: Eda Iha, MD;  Location: [facility] ENDOSCOPY;  Service: Gastroenterology;  Laterality: N/A;   INSERTION OF DIALYSIS CATHETER N/A 07/08/2021   Procedure: INSERTION OF DIALYSIS CATHETER;  Surgeon: Serene Gaile ORN, MD;  Location: [facility] OR;  Service: Vascular;  Laterality: N/A;   JOINT REPLACEMENT  2016   knee right    PERIPHERAL VASCULAR BALLOON ANGIOPLASTY  05/11/2023   Procedure: PERIPHERAL VASCULAR BALLOON ANGIOPLASTY;  Surgeon: Melia Lynwood ORN, MD;  Location: [facility] INVASIVE CV LAB;  Service: Cardiovascular;;  Cephalic Arch Stenosis   POLYPECTOMY       reports that he quit smoking about 52 years ago. His smoking use included cigarettes. He has never used smokeless tobacco. He reports current alcohol use. He reports that he does not use drugs.   Family History  Problem Relation Age of Onset   Cancer Mother    Cancer Father    Hypertension Father    Heart disease Father    Stomach cancer Father    Cancer Sister    Diabetes Sister    Heart disease Sister    Hypertension Sister    Mental illness Brother    Colon cancer Neg Hx    Colon polyps Neg Hx    Esophageal cancer Neg Hx    Rectal cancer Neg Hx     Physical Exam:   Vitals:   03/25/24 1157  BP: 136/79  Pulse: (!) 57  Resp: 14  Temp: 97.7 F (36.5 C)  TempSrc: Oral   SpO2: 100%   Constitutional: NAD, calm, comfortable Eyes: PERRL, lids and conjunctivae normal ENMT: Mucous membranes are moist. Posterior pharynx clear of any exudate or lesions.Normal dentition.  Neck: normal, supple, no masses, no thyromegaly Respiratory: clear to auscultation bilaterally, no wheezing, no crackles. Normal respiratory effort. No accessory muscle use.  Cardiovascular: Regular rate and rhythm, no murmurs / rubs / gallops. No extremity edema. 2+ pedal pulses. No carotid bruits.  Abdomen: no tenderness, no masses palpated. No hepatosplenomegaly. Bowel sounds positive.  Musculoskeletal: Left hip tenderness, limited range of motion, positive pulses  no clubbing / cyanosis. No joint deformity upper and lower extremities. Good ROM, no  contractures. Normal muscle tone.  Neurologic: CN II-XII grossly intact. Sensation intact, DTR normal. Strength 5/5 in all 4.  Psychiatric: Normal judgment and insight. Alert and oriented x 3. Normal mood.  Skin: no rashes, lesions, ulcers. No induration          Labs on admission:    I have personally reviewed following labs and imaging studies  CBC: Recent Labs  Lab 03/25/24 1156  WBC 10.4  NEUTROABS 8.2*  HGB 15.6  HCT 47.5  MCV 92.2  PLT 264   Basic Metabolic Panel: Recent Labs  Lab 03/25/24 1156  NA 138  K 4.3  CL 98  CO2 25  GLUCOSE 90  BUN 38*  CREATININE 6.05*  CALCIUM 10.2   GFR: CrCl cannot be calculated (Unknown ideal weight.). Liver Function Tests: No results for input(s): AST, ALT, ALKPHOS, BILITOT, PROT, ALBUMIN  in the last 168 hours. No results for input(s): LIPASE, AMYLASE in the last 168 hours. No results for input(s): AMMONIA in the last 168 hours. Coagulation Profile: Recent Labs  Lab 03/25/24 1156  INR 1.1    Urine analysis:    Component Value Date/Time   COLORURINE YELLOW 07/06/2021 1513   APPEARANCEUR CLEAR 07/06/2021 1513   LABSPEC 1.014 07/06/2021 1513   PHURINE  5.0 07/06/2021 1513   GLUCOSEU NEGATIVE 07/06/2021 1513   HGBUR NEGATIVE 07/06/2021 1513   BILIRUBINUR NEGATIVE 07/06/2021 1513   BILIRUBINUR neg 02/06/2013 1332   KETONESUR NEGATIVE 07/06/2021 1513   PROTEINUR 100 (A) 07/06/2021 1513   UROBILINOGEN 0.2 02/06/2013 1332   NITRITE NEGATIVE 07/06/2021 1513   LEUKOCYTESUR NEGATIVE 07/06/2021 1513    Last A1C:  Lab Results  Component Value Date   HGBA1C 5.8 10/08/2020     Radiologic Exams on Admission:   DG Chest 1 View Result Date: 03/25/2024 CLINICAL DATA:  Fall EXAM: CHEST  1 VIEW COMPARISON:  July 08, 2021 FINDINGS: Stable cardiomediastinal silhouette. Minimal right midlung subsegmental atelectasis or scarring is noted. Left lung is clear. Bony thorax is unremarkable. IMPRESSION: Minimal right midlung subsegmental atelectasis or scarring. Electronically Signed   By: Lynwood Landy Raddle M.D.   On: 03/25/2024 13:30   DG Hip Unilat With Pelvis 2-3 Views Left Result Date: 03/25/2024 CLINICAL DATA:  Left hip pain after fall EXAM: DG HIP (WITH OR WITHOUT PELVIS) 2-3V LEFT COMPARISON:  None Available. FINDINGS: Minimally displaced fracture is seen involving the greater trochanter of the proximal left femur. Mild narrowing of left hip is noted. IMPRESSION: Minimally displaced greater trochanteric fracture of proximal left femur. CT scan may be performed to evaluate for more extensive fracture than visualized. Electronically Signed   By: Lynwood Landy Raddle M.D.   On: 03/25/2024 13:28    EKG:   Independently reviewed.  Orders placed or performed during the hospital encounter of 03/25/24   ED EKG   ED EKG   EKG 12-Lead   ---------------------------------------------------------------------------------------------------------------------------------------    Assessment / Plan:   Principal Problem:   Closed left hip fracture, initial encounter [facility]) Active Problems:   Primary hypertension   Depression   Controlled atrial fibrillation  (HCC)   Transitional cell carcinoma of bladder 2007   GERD (gastroesophageal reflux disease)   ESRD (end stage renal disease) (HCC)   Assessment and Plan: * Closed left hip fracture, initial encounter (HCC) - Status post accidental fall, left hip pain, fracture -Left hip X-ray left hip: Minimally displaced greater trochanteric fracture of proximal left femur.  - Rec per recommendation obtaining CT scan for further detail  evaluate - Orthopedic team Dr. Celena has been consulted by EDP, Patient's last oral intake of Eliquis  was on the ninth of 03/25/23 -Continue to hold Eliquis  - N.p.o. after midnight - Anticipated ORIF in a.m. 03/26/24 - As needed IV and p.o. analgesics    Controlled atrial fibrillation (HCC) Rate well-controlled on amiodarone  -Continue p.o. amiodarone  -On Eliquis -will be on HOLD (last dose taken 9 open 03/24/2024) - Last echo April 2023, EF 60-65%, normal LV function, grade 1 diastolic dysfunction, ventricles within normal limits, valves within normal limits -  Depression Mood stable, continue paroxetine   Primary hypertension Blood pressure stable, monitoring closely - As needed hydralazine  (Per wife and patient his blood pressure fluctuates quite a bit especially postdialysis)  Transitional cell carcinoma of bladder 2007 Remote history in remission  ESRD (end stage renal disease) (HCC) On hemodialysis-Tuesdays, Thursdays, Saturdays -Status post hemodialysis yesterday  -Primary nephrologist Dr. Prescilla -Consulting nephrology Dr. Geralynn       Consults called: Orthopedic/nephrologist -------------------------------------------------------------------------------------------------------------------------------------------- DVT prophylaxis:  heparin  injection 5,000 Units Start: 03/25/24 1430 SCDs Start: 03/25/24 1415   Code Status:   Code Status: Full Code   Admission status: Patient will be admitted as Observation, with a greater than 2 midnight  length of stay. Level of care: Telemetry   Family Communication: Wife present at bedside-updated (The above findings and plan of care has been discussed with patient in detail, the patient expressed understanding and agreement of above plan)  --------------------------------------------------------------------------------------------------------------------------------------------------  Disposition Plan:  Anticipated 1-2 days Status is: Inpatient Remains inpatient appropriate because: Needing surgical invention for, for left hip fracture     ----------------------------------------------------------------------------------------------------------------------------------------------------  Time spent:  32  Min.  Was spent seeing and evaluating the patient, reviewing all medical records, drawn plan of care.  SIGNED: Adriana DELENA Grams, MD, FHM. FAAFP. [facility], Pager  (Please use [email or web address] to page/ or secure chat through epic) If 7PM-7AM, please contact night-coverage [email or web address],  03/25/2024, 2:36 PM     [1] No Known Allergies  "

## 2024-03-26 ENCOUNTER — Encounter (HOSPITAL_COMMUNITY): Payer: Self-pay | Admitting: Family Medicine

## 2024-03-26 ENCOUNTER — Inpatient Hospital Stay (HOSPITAL_COMMUNITY)

## 2024-03-26 DIAGNOSIS — S72002A Fracture of unspecified part of neck of left femur, initial encounter for closed fracture: Secondary | ICD-10-CM

## 2024-03-26 LAB — PROTIME-INR
INR: 1.1 (ref 0.8–1.2)
Prothrombin Time: 15 s (ref 11.4–15.2)

## 2024-03-26 LAB — BASIC METABOLIC PANEL WITH GFR
Anion gap: 13 (ref 5–15)
BUN: 51 mg/dL — ABNORMAL HIGH (ref 8–23)
CO2: 27 mmol/L (ref 22–32)
Calcium: 10.1 mg/dL (ref 8.9–10.3)
Chloride: 98 mmol/L (ref 98–111)
Creatinine, Ser: 6.82 mg/dL — ABNORMAL HIGH (ref 0.61–1.24)
GFR, Estimated: 8 mL/min — ABNORMAL LOW
Glucose, Bld: 76 mg/dL (ref 70–99)
Potassium: 4.4 mmol/L (ref 3.5–5.1)
Sodium: 138 mmol/L (ref 135–145)

## 2024-03-26 LAB — APTT: aPTT: 35 s (ref 24–36)

## 2024-03-26 LAB — HEPATITIS B SURFACE ANTIGEN: Hepatitis B Surface Ag: NONREACTIVE

## 2024-03-26 LAB — MRSA NEXT GEN BY PCR, NASAL: MRSA by PCR Next Gen: NOT DETECTED

## 2024-03-26 MED ORDER — ALBUMIN HUMAN 25 % IV SOLN: 25.0000 g | INTRAVENOUS | Status: AC | PRN

## 2024-03-26 MED ORDER — CHLORHEXIDINE GLUCONATE CLOTH 2 % EX PADS
6.0000 | MEDICATED_PAD | Freq: Every day | CUTANEOUS | Status: DC
  Administered 2024-03-28 – 2024-04-03 (×2): 6 via TOPICAL

## 2024-03-26 NOTE — Progress Notes (Signed)
 PT Cancellation Note  Patient Details Name: Abdulhadi Stopa MRN: [id number] DOB: [birth year]   Cancelled Treatment:    Reason Eval/Treat Not Completed: Patient at procedure or test/unavailable  Off the floor at MRI;   Will follow up later today as time allows;  Otherwise, will follow up for PT tomorrow;   Thank you,  Silvano Currier, PT  Acute Rehabilitation Services Office [phone]    Silvano VEAR Currier 03/26/2024, 12:18 PM

## 2024-03-26 NOTE — Progress Notes (Signed)
 I spoke with the patient who understands the diagnosis and is in agreement with the plan for treatment. I also left a message with his wife per his instructions.  Christopher Bruch, MD [facility], [facility] [phone]

## 2024-03-26 NOTE — TOC CAGE-AID Note (Signed)
 Transition of Care [facility]) - CAGE-AID Screening  Patient Details  Name: Christopher [name] MRN: [id number] Date of Birth: 19-Jun-1950  Clinical Narrative:  Patient endorses drinking alcohol in the past, but has rarely had alcohol in the last 4 years. Patient states he may drink a single drink a few times a year. Patient denies any current drug use or cigarettes, denies need for substance abuse resources at this time.  CAGE-AID Screening:   Have You Ever Felt You Ought to Cut Down on Your Drinking or Drug Use?: No Have People Annoyed You By Critizing Your Drinking Or Drug Use?: No Have You Felt Bad Or Guilty About Your Drinking Or Drug Use?: No Have You Ever Had a Drink or Used Drugs First Thing In The Morning to Steady Your Nerves or to Get Rid of a Hangover?: No CAGE-AID Score: 0  Substance Abuse Education Offered: No

## 2024-03-26 NOTE — Plan of Care (Signed)

## 2024-03-26 NOTE — Evaluation (Signed)
 Physical Therapy Evaluation Patient Details Name: Dale Ribeiro MRN: [id number] DOB: 1950/08/29 Today's Date: 03/26/2024  History of Present Illness  74 yo M adm 1/4 s/p fall with L greater trochanter fx, likely non-op management, WBAT, no active abduction; Ortho ordered MRI to r/o further fxs, results pending; PMH ESRD with HD, Afib on Eliquis , bladder carcinoma, depression anxiety, HTN  Clinical Impression   Patient admitted with above diagnosis; he lives with his wife in a single level home with five steps to enter; patient is typically moving well, usually walks without an assistive device; does the majority of cooking, and housework, assists with caregiving for his wife who had a stroke; he typically drives himself to and from hemodialysis; presents to PT with active motion restrictions left hip, pain with motion of left hip, increased difficulty with bed mobility and functional transfers, a significant functional decline compared to his baseline; today he needed increased time and 2 person moderate assist for bed mobility and functional transfers; he seemed pleased with being able to get up, despite his initial worry about increased pain; shows very good rehab potential; at this time, recommend postacute rehab rehabilitation to maximize independence and safety with mobility and activities of daily living in order to prepare to get home; will benefit from acute physical therapy services to work on Mobility and to facilitate discharge to the most appropriate venue of care        If plan is discharge home, recommend the following: A lot of help with walking and/or transfers;A lot of help with bathing/dressing/bathroom   Can travel by private vehicle   No    Equipment Recommendations Rolling walker (2 wheels);BSC/3in1 (would need transportation to/from HD)  Recommendations for Other Services       Functional Status Assessment Patient has had a recent decline in their functional status  and demonstrates the ability to make significant improvements in function in a reasonable and predictable amount of time.     Precautions / Restrictions Precautions Precautions: Fall;Other (comment) (Simultaneous filing. User may not have seen previous data.) Recall of Precautions/Restrictions: Impaired (Simultaneous filing. User may not have seen previous data.) Precaution/Restrictions Comments: WBAT, no active abduction (Simultaneous filing. User may not have seen previous data.) Restrictions Weight Bearing Restrictions Per Provider Order: No (Simultaneous filing. User may not have seen previous data.) Other Position/Activity Restrictions: No active abduction      Mobility  Bed Mobility Overal bed mobility: Needs Assistance Bed Mobility: Supine to Sit     Supine to sit: +2 for physical assistance, Mod assist     General bed mobility comments: Step by step cues to move LEs closer to EOB while observing no active abduction; pt had good use of rails to pull trunk up, 2 person mod assist and use of bed pad to square off hips EOB    Transfers Overall transfer level: Needs assistance Equipment used: Rolling walker (2 wheels) Transfers: Sit to/from Stand, Bed to chair/wheelchair/BSC Sit to Stand: +2 physical assistance, Mod assist   Step pivot transfers: +2 physical assistance, Min assist, Mod assist       General transfer comment: Mod assist of 2 to rise and steady; shows good problem-solving with hand placement; step-by-step cues for sid/pivot steps bed to recliner on pt's r side, with close guard and observation for LLE to step inward towards RLE during step-pivot    Ambulation/Gait                  Stairs  Wheelchair Mobility     Tilt Bed    Modified Rankin (Stroke Patients Only)       Balance Overall balance assessment: Needs assistance   Sitting balance-Leahy Scale: Fair       Standing balance-Leahy Scale: Poor                                Pertinent Vitals/Pain Pain Assessment Pain Assessment: 0-10 (Simultaneous filing. User may not have seen previous data.) Pain Score: 4  (Simultaneous filing. User may not have seen previous data.) Pain Location: LLE (Simultaneous filing. User may not have seen previous data.) Pain Descriptors / Indicators: Grimacing, Guarding (Simultaneous filing. User may not have seen previous data.) Pain Intervention(s): RN gave pain meds during session (Simultaneous filing. User may not have seen previous data.)    Home Living Family/patient expects to be discharged to:: Private residence Living Arrangements: Spouse/significant other Available Help at Discharge: Family;Available PRN/intermittently Type of Home: Mobile home Home Access: Stairs to enter Entrance Stairs-Rails: Left Entrance Stairs-Number of Steps: 5   Home Layout: Other (Comment) Home Equipment: Cane - single point Additional Comments: Cares for wife and grandchildren at home    Prior Function Prior Level of Function : Independent/Modified Independent;Driving;History of Falls (last six months)             Mobility Comments: independent ADLs Comments: independent     Extremity/Trunk Assessment   Upper Extremity Assessment Upper Extremity Assessment: Overall WFL for tasks assessed    Lower Extremity Assessment Lower Extremity Assessment: Defer to PT evaluation RLE Deficits / Details: Pt described recent R knee shooting pain -- did not overtly notice this pain during mobility eval, and pt did not describe this pain during transfers LLE Deficits / Details: Noting grimace with moving LLE, especially with bed mobility; less noted overt pain with sit<>stand, weight bearing, and stepping LLe into adduction    Cervical / Trunk Assessment Cervical / Trunk Assessment: Normal  Communication   Communication Communication: No apparent difficulties Factors Affecting Communication:  (minimally verbose)     Cognition Arousal: Alert (Simultaneous filing. User may not have seen previous data.) Behavior During Therapy: [facility] for tasks assessed/performed (Simultaneous filing. User may not have seen previous data.)   PT - Cognitive impairments: Memory                       PT - Cognition Comments: Needd prompts to teach back precautions to nsg Following commands: Intact (Simultaneous filing. User may not have seen previous data.)       Cueing Cueing Techniques: Verbal cues, Gestural cues (demonstrational  Simultaneous filing. User may not have seen previous data.)     General Comments General comments (skin integrity, edema, etc.): VSS on RA. Need to consider getting to/from HD 3x/week in dc planning, , as he typically drives himself    Exercises     Assessment/Plan    PT Assessment Patient needs continued PT services  PT Problem List Decreased strength;Decreased range of motion;Decreased activity tolerance;Decreased balance;Decreased mobility;Decreased knowledge of use of DME;Decreased safety awareness;Decreased knowledge of precautions;Pain       PT Treatment Interventions DME instruction;Gait training;Stair training;Functional mobility training;Therapeutic activities;Therapeutic exercise;Balance training;Neuromuscular re-education;Cognitive remediation;Patient/family education;Wheelchair mobility training;Manual techniques;Modalities    PT Goals (Current goals can be found in the Care Plan section)  Acute Rehab PT Goals Patient Stated Goal: Get home PT Goal Formulation: With patient Time For Goal Achievement:  04/09/24 Potential to Achieve Goals: Good    Frequency Min 2X/week     Co-evaluation   Reason for Co-Treatment: Complexity of the patient's impairments (multi-system involvement);For patient/therapist safety;To address functional/ADL transfers   OT goals addressed during session: ADL's and self-care;Proper use of Adaptive equipment and DME       AM-PAC PT 6  Clicks Mobility  Outcome Measure Help needed turning from your back to your side while in a flat bed without using bedrails?: A Lot Help needed moving from lying on your back to sitting on the side of a flat bed without using bedrails?: A Lot Help needed moving to and from a bed to a chair (including a wheelchair)?: A Lot Help needed standing up from a chair using your arms (e.g., wheelchair or bedside chair)?: A Lot Help needed to walk in hospital room?: Total Help needed climbing 3-5 steps with a railing? : Total 6 Click Score: 10    End of Session Equipment Utilized During Treatment: Gait belt Activity Tolerance: Patient tolerated treatment well Patient left: in chair;with call bell/phone within reach;with chair alarm set;with family/visitor present Nurse Communication: Mobility status;Need for lift equipment PT Visit Diagnosis: Other abnormalities of gait and mobility (R26.89);Pain Pain - Right/Left: Left Pain - part of body: Hip    Time: 1225-1309 PT Time Calculation (min) (ACUTE ONLY): 44 min   Charges:   PT Evaluation $PT Eval Moderate Complexity: 1 Mod   PT General Charges $$ ACUTE PT VISIT: 1 Visit         Silvano Currier, PT  Acute Rehabilitation Services Office [phone] Secure Chat welcomed   Silvano VEAR Currier 03/26/2024, 5:31 PM

## 2024-03-26 NOTE — Progress Notes (Signed)
 " PROGRESS NOTE  Christopher [name] FMW:[id number] DOB: [birth year] DOA: 03/25/2024 PCP: Purcell Emil Schanz, MD   LOS: 1 day   Brief narrative:  Jacier Gladu is a  74 y.o male with history of ESRD (TTS), A-fib (on Eliquis ), HTN, anxiety, depression, bladder cancer, presented to hospital with dizziness after dialysis and had a mechanical fall with left hip pain.  Did not lose consciousness.  In the ED patient was slightly bradycardic.  Labs were normal except for creatinine elevation at 6.0.  X-ray of the left hip showed minimally displaced  greater trochanteric fracture of proximal left femur.  CT abdomen and pelvis showed minimally displaced fracture of the greater trochanter on the left.  Orthopedics was consulted and patient was out of the hospital for further evaluation and treatment.    Assessment/Plan: Principal Problem:   Closed left hip fracture, initial encounter [facility]) Active Problems:   Primary hypertension   Depression   Controlled atrial fibrillation (HCC)   Transitional cell carcinoma of bladder 2007   GERD (gastroesophageal reflux disease)   ESRD (end stage renal disease) (HCC)   Closed left hip fracture, initial encounter Status post mechanical fall.  X-ray with minimally displaced greater trochanter fracture.  CT scan showing the same.  Continue analgesics and pain management.  Looks like patient has isolated greater trochanteric fracture and likely to be managed nonoperatively.  Will get PT OT evaluation.  Orthopedics on board and has recommended MRI of the hip at this time.   Controlled atrial fibrillation Rate well-controlled on amiodarone .  Eliquis  on hold.  Review of 2D echocardiogram from April 2023 with LV ejection fraction of 60 to 65%.  Depression Continue paroxetine .   Primary hypertension Fluctuates with dialysis.  Continue to monitor.   Transitional cell carcinoma of bladder 2007 In remission.   ESRD (end stage renal disease)  On  hemodialysis-Tuesdays, Thursdays, Saturdays.  Nephrology has been consulted.   DVT prophylaxis: heparin  injection 5,000 Units Start: 03/25/24 2200 SCDs Start: 03/25/24 1415   Disposition: Uncertain at this time will get PT OT and orthopedic evaluation  Status is: Inpatient Remains inpatient appropriate because: Pending clinical improvement    Code Status:     Code Status: Full Code  Family Communication: Patient's spouse at bedside  Consultants: Orthopedics  Procedures: None  Anti-infectives:  None at this time  Anti-infectives (From admission, onward)    None        Subjective: Today, patient was seen and examined at bedside.  Patient complains of left hip pain.  No shortness of breath nausea vomiting or dyspnea.  Objective: Vitals:   03/26/24 0401 03/26/24 0725  BP: 134/73 108/69  Pulse: 61 (!) 59  Resp: 18   Temp: (!) 97.4 F (36.3 C) 98.1 F (36.7 C)  SpO2: 95% 95%    Intake/Output Summary (Last 24 hours) at 03/26/2024 1100 Last data filed at 03/26/2024 0600 Gross per 24 hour  Intake 440 ml  Output 750 ml  Net -310 ml   Filed Weights   03/25/24 1538 03/26/24 0500  Weight: 80.7 kg 85.4 kg   Body mass index is 27.8 kg/m.   Physical Exam: GENERAL: Patient is alert awake and oriented. Not in obvious distress. HENT: No scleral pallor or icterus. Pupils equally reactive to light. Oral mucosa is moist NECK: is supple, no gross swelling noted. CHEST: Clear to auscultation. No crackles or wheezes.  Diminished breath sounds bilaterally. CVS: S1 and S2 heard, no murmur. Regular rate and rhythm.  ABDOMEN: Soft,  non-tender, bowel sounds are present. EXTREMITIES: No edema.  Tenderness over the left hip. CNS: Cranial nerves are intact. No focal motor deficits. SKIN: warm and dry without rashes.  Data Review: I have personally reviewed the following laboratory data and studies,  CBC: Recent Labs  Lab 03/25/24 1156  WBC 10.4  NEUTROABS 8.2*  HGB 15.6   HCT 47.5  MCV 92.2  PLT 264   Basic Metabolic Panel: Recent Labs  Lab 03/25/24 1156 03/26/24 0225  NA 138 138  K 4.3 4.4  CL 98 98  CO2 25 27  GLUCOSE 90 76  BUN 38* 51*  CREATININE 6.05* 6.82*  CALCIUM 10.2 10.1  MG 2.1  --   PHOS 2.9  --    Liver Function Tests: No results for input(s): AST, ALT, ALKPHOS, BILITOT, PROT, ALBUMIN  in the last 168 hours. No results for input(s): LIPASE, AMYLASE in the last 168 hours. No results for input(s): AMMONIA in the last 168 hours. Cardiac Enzymes: No results for input(s): CKTOTAL, CKMB, CKMBINDEX, TROPONINI in the last 168 hours. BNP (last 3 results) No results for input(s): BNP in the last 8760 hours.  ProBNP (last 3 results) No results for input(s): PROBNP in the last 8760 hours.  CBG: No results for input(s): GLUCAP in the last 168 hours. Recent Results (from the past 240 hours)  MRSA Next Gen by PCR, Nasal     Status: None   Collection Time: 03/25/24  3:36 AM   Specimen: Nasal Mucosa; Nasal Swab  Result Value Ref Range Status   MRSA by PCR Next Gen NOT DETECTED NOT DETECTED Final    Comment: (NOTE) The GeneXpert MRSA Assay (FDA approved for NASAL specimens only), is one component of a comprehensive MRSA colonization surveillance program. It is not intended to diagnose MRSA infection nor to guide or monitor treatment for MRSA infections. Test performance is not FDA approved in patients less than 74 years old. Performed at [facility], [address], [place], [place] [address]      Studies: CT PELVIS WO CONTRAST Result Date: 03/25/2024 CLINICAL DATA:  Hip trauma, fracture suspected. EXAM: CT PELVIS WITHOUT CONTRAST TECHNIQUE: Multidetector CT imaging of the pelvis was performed following the standard protocol without intravenous contrast. RADIATION DOSE REDUCTION: This exam was performed according to the departmental dose-optimization program which includes automated exposure  control, adjustment of the mA and/or kV according to patient size and/or use of iterative reconstruction technique. COMPARISON:  03/25/2024. FINDINGS: Urinary Tract:  No abnormality visualized. Bowel: Diverticulosis without diverticulitis. No bowel obstruction is seen. The visualized portion of the appendix appears normal. Vascular/Lymphatic: No pathologically enlarged lymph nodes. No significant vascular abnormality seen. Reproductive:  Prostate gland is enlarged. Other:  No pelvic ascites. Musculoskeletal: There is a minimally displaced comminuted fracture of the greater trochanter. The remaining bony structures are intact. No dislocation is seen. There are mild degenerative changes at the hips bilaterally and lower lumbar spine. Soft tissue swelling is present adjacent to the greater trochanter on the left, possible edema or hematoma. IMPRESSION: 1. Minimally displaced fracture of the greater trochanter on the left. 2. Diverticulosis without diverticulitis. 3. Enlarged prostate gland. Electronically Signed   By: Leita Birmingham M.D.   On: 03/25/2024 16:44   DG Chest 1 View Result Date: 03/25/2024 CLINICAL DATA:  Fall EXAM: CHEST  1 VIEW COMPARISON:  July 08, 2021 FINDINGS: Stable cardiomediastinal silhouette. Minimal right midlung subsegmental atelectasis or scarring is noted. Left lung is clear. Bony thorax is unremarkable. IMPRESSION:  Minimal right midlung subsegmental atelectasis or scarring. Electronically Signed   By: Lynwood Landy Raddle M.D.   On: 03/25/2024 13:30   DG Hip Unilat With Pelvis 2-3 Views Left Result Date: 03/25/2024 CLINICAL DATA:  Left hip pain after fall EXAM: DG HIP (WITH OR WITHOUT PELVIS) 2-3V LEFT COMPARISON:  None Available. FINDINGS: Minimally displaced fracture is seen involving the greater trochanter of the proximal left femur. Mild narrowing of left hip is noted. IMPRESSION: Minimally displaced greater trochanteric fracture of proximal left femur. CT scan may be performed to evaluate  for more extensive fracture than visualized. Electronically Signed   By: Lynwood Landy Raddle M.D.   On: 03/25/2024 13:28      Vernal Alstrom, MD  [facility] 03/26/2024  If 7PM-7AM, please contact night-coverage             "

## 2024-03-26 NOTE — Anesthesia Preprocedure Evaluation (Addendum)
 "                                  Anesthesia Evaluation  Patient identified by MRN, date of birth, ID band Patient awake    Reviewed: Allergy & Precautions, NPO status , Patient's Chart, lab work & pertinent test results  Airway Mallampati: I  TM Distance: >3 FB     Dental  (+) Edentulous Upper, Edentulous Lower   Pulmonary former smoker   breath sounds clear to auscultation + decreased breath sounds      Cardiovascular hypertension, Pt. on medications Normal cardiovascular exam+ dysrhythmias Atrial Fibrillation  Rhythm:Regular Rate:Normal  EKG 03/26/24 NSR, Incomplete RBBB pattern Septal infarct, age undetermined Possible lateral infarct  Echo 07/07/21 1. Left ventricular ejection fraction, by estimation, is 60 to 65%. The  left ventricle has normal function. The left ventricle has no regional  wall motion abnormalities. Left ventricular diastolic parameters are  consistent with Grade I diastolic  dysfunction (impaired relaxation).   2. Right ventricular systolic function is normal. The right ventricular  size is normal.   3. Left atrial size was mild to moderately dilated.   4. The mitral valve is grossly normal. Mild mitral valve regurgitation.  No evidence of mitral stenosis.   5. The aortic valve is tricuspid. Aortic valve regurgitation is mild. No  aortic stenosis is present.    EP   Neuro/Psych  PSYCHIATRIC DISORDERS Anxiety Depression    negative neurological ROS     GI/Hepatic Neg liver ROS,GERD  Medicated,,  Endo/Other  negative endocrine ROS    Renal/GU ESRF and DialysisRenal diseaseLast dialysis-1/3  Lab Results      Component                Value               Date                      NA                       138                 03/26/2024                CL                       98                  03/26/2024                K                        4.4                 03/26/2024                CO2                      27                   03/26/2024                BUN                      51 (  H)              03/26/2024                CREATININE               6.82 (H)            03/26/2024                GFRNONAA                 8 (L)               03/26/2024                CALCIUM                  10.1                03/26/2024                PHOS                     2.9                 03/25/2024                ALBUMIN                   3.0 (L)             07/10/2021                GLUCOSE                  76                  03/26/2024       Lab Results      Component                Value               Date                      NA                       137                 03/27/2024                CL                       98                  03/27/2024                K                        5.7 (H)             03/27/2024                CO2                      26                  03/27/2024  BUN                      72 (H)              03/27/2024                CREATININE               8.24 (H)            03/27/2024                GFRNONAA                 6 (L)               03/27/2024                CALCIUM                  10.2                03/27/2024                PHOS                     2.9                 03/25/2024                ALBUMIN                   3.0 (L)             07/10/2021                GLUCOSE                  100 (H)             03/27/2024                   Bladder Ca negative genitourinary   Musculoskeletal Left intertrochanteric hip Fx   Abdominal   Peds  Hematology Eliquis  therapy- last dose 1/3 pm  Lab Results      Component                Value               Date                      WBC                      10.4                03/25/2024                HGB                      15.6                03/25/2024                HCT                      47.5                03/25/2024                MCV  92.2                03/25/2024                 PLT                      264                 03/25/2024              Anesthesia Other Findings   Reproductive/Obstetrics                              Anesthesia Physical Anesthesia Plan  ASA: 4  Anesthesia Plan: General   Post-op Pain Management: Dilaudid  IV   Induction: Intravenous  PONV Risk Score and Plan: 3 and Treatment may vary due to age or medical condition, Ondansetron  and Dexamethasone   Airway Management Planned: Oral ETT  Additional Equipment: None  Intra-op Plan:   Post-operative Plan: Extubation in OR  Informed Consent: I have reviewed the patients History and Physical, chart, labs and discussed the procedure including the risks, benefits and alternatives for the proposed anesthesia with the patient or authorized representative who has indicated his/her understanding and acceptance.     Dental advisory given  Plan Discussed with: CRNA and Anesthesiologist  Anesthesia Plan Comments:          Anesthesia Quick Evaluation  "

## 2024-03-26 NOTE — Consult Note (Signed)
 Renal Service Consult Note [facility] Lamar JONETTA Fret, MD  Patient: Christopher [name] Date: 03/26/2024 Requesting Physician: Dr. Sonjia  Reason for Consult: ESRD pt w/ fall and L hip pain HPI: The patient is a 74 y.o. year-old w/ PMH as below who presented to ED yesterday c/o fall at room and landing on L hip.  Typically has some dizziness/ lightheadedness after dialysis.  In ED yesterday BP was 130/74, HR 78, RR 18, temp 98, 97% O2 sat on room air.  Labs showed K+ 4.3, creatinine 6.0, Phos 2.9, hemoglobin 15 and WBC 10K.  Orthopedics was consulted.  Patient was admitted.  Orthopedics found a left greater trochanter fracture.  They are not sure if the fracture is extending to the neck, and would like to obtain an MRI.  They said it was okay to work with therapies in the interim.  We are asked to see for ESRD.   Pt seen in room.  He says he started dialysis a couple years ago, in 2023.  He says after dialysis sometimes he gets lightheaded with standing.  He said on Saturday after dialysis had to keep in 15 minutes to for his blood pressure to regulate.  He feels fine right now except some for some left hip pain.  He tries to follow his fluid restriction.  No SOB or CP.   ROS - denies CP, no joint pain, no HA, no blurry vision, no rash, no diarrhea, no nausea/ vomiting   Past Medical History  Past Medical History:  Diagnosis Date   Anxiety    Cancer (HCC)    bladder  cancer- stage 1- removed 2007   Chronic kidney disease    CKD    Depression    Hypertension    Past Surgical History  Past Surgical History:  Procedure Laterality Date   A/V FISTULAGRAM N/A 05/11/2023   Procedure: A/V Fistulagram;  Surgeon: Melia Lynwood ORN, MD;  Location: [facility] INVASIVE CV LAB;  Service: Cardiovascular;  Laterality: N/A;   AV FISTULA PLACEMENT Right 07/08/2021   Procedure: RIGHT ARM FISTULA  CREATION;  Surgeon: Serene Gaile ORN, MD;  Location: [facility] OR;  Service: Vascular;  Laterality: Right;    BIOPSY  07/11/2021   Procedure: BIOPSY;  Surgeon: Eda Iha, MD;  Location: [facility] ENDOSCOPY;  Service: Gastroenterology;;   BLADDER SURGERY     CARDIOVERSION N/A 05/27/2023   Procedure: CARDIOVERSION;  Surgeon: Pietro Redell RAMAN, MD;  Location: [facility] INVASIVE CV LAB;  Service: Cardiovascular;  Laterality: N/A;   COLONOSCOPY     ESOPHAGOGASTRODUODENOSCOPY (EGD) WITH PROPOFOL  N/A 07/11/2021   Procedure: ESOPHAGOGASTRODUODENOSCOPY (EGD) WITH PROPOFOL ;  Surgeon: Eda Iha, MD;  Location: [facility] ENDOSCOPY;  Service: Gastroenterology;  Laterality: N/A;   INSERTION OF DIALYSIS CATHETER N/A 07/08/2021   Procedure: INSERTION OF DIALYSIS CATHETER;  Surgeon: Serene Gaile ORN, MD;  Location: [facility] OR;  Service: Vascular;  Laterality: N/A;   JOINT REPLACEMENT  2016   knee right    PERIPHERAL VASCULAR BALLOON ANGIOPLASTY  05/11/2023   Procedure: PERIPHERAL VASCULAR BALLOON ANGIOPLASTY;  Surgeon: Melia Lynwood ORN, MD;  Location: [facility] INVASIVE CV LAB;  Service: Cardiovascular;;  Cephalic Arch Stenosis   POLYPECTOMY     Family History  Family History  Problem Relation Age of Onset   Cancer Mother    Cancer Father    Hypertension Father    Heart disease Father    Stomach cancer Father    Cancer Sister    Diabetes Sister    Heart disease  Sister    Hypertension Sister    Mental illness Brother    Colon cancer Neg Hx    Colon polyps Neg Hx    Esophageal cancer Neg Hx    Rectal cancer Neg Hx    Social History  reports that he quit smoking about 52 years ago. His smoking use included cigarettes. He has never used smokeless tobacco. He reports current alcohol use. He reports that he does not use drugs. Allergies Allergies[1] Home medications Prior to Admission medications  Medication Sig Start Date End Date Taking? Authorizing Provider  acetaminophen  (TYLENOL ) 500 MG tablet Take 1,000 mg by mouth as needed for mild pain (pain score 1-3) or moderate pain (pain score 4-6).   Yes [provider]   amiodarone  (PACERONE ) 200 MG tablet Take 1 tablet (200 mg total) by mouth daily. 02/02/24  Yes Terra Fairy PARAS, PA-C  apixaban  (ELIQUIS ) 5 MG TABS tablet Take 1 tablet (5 mg total) by mouth 2 (two) times daily. 12/07/23  Yes Terra Fairy PARAS, PA-C  B Complex-C-Folic Acid (RENAL-VITE) 0.8 MG TABS Take 1 tablet by mouth every morning. 09/15/23  Yes [provider]  Multiple Vitamin (MULTIVITAMIN WITH MINERALS) TABS tablet Take 1 tablet by mouth daily.   Yes [provider]  Omega-3 Fatty Acids (FISH OIL PO) Take 500 mg by mouth every morning.   Yes [provider]  pantoprazole  (PROTONIX ) 40 MG tablet TAKE 1 TABLET(40 MG) BY MOUTH DAILY 11/14/23  Yes Sagardia, Miguel Jose, MD  PARoxetine  (PAXIL ) 40 MG tablet TAKE 1 TABLET BY MOUTH EVERY DAY 02/19/24  Yes Sagardia, Miguel Jose, MD  SENSIPAR  30 MG tablet Take 30 mg by mouth every Monday, Wednesday, and Friday with hemodialysis. Taking 1 tablet by mouth three times weekly with meals 07/19/23  Yes [provider]  tamsulosin  (FLOMAX ) 0.4 MG CAPS capsule Take 0.4 mg by mouth daily. 04/28/21  Yes [provider]  VELPHORO  500 MG chewable tablet Chew 500 mg by mouth 3 (three) times daily with meals. 08/18/23  Yes [provider]     Vitals:   03/26/24 0401 03/26/24 0500 03/26/24 0725 03/26/24 1227  BP: 134/73  108/69 132/72  Pulse: 61  (!) 59 61  Resp: 18     Temp: (!) 97.4 F (36.3 C)  98.1 F (36.7 C) 97.9 F (36.6 C)  TempSrc: Oral  Oral Oral  SpO2: 95%  95% 97%  Weight:  85.4 kg    Height:       Exam Gen alert, no distress Sclera anicteric, throat clear  No jvd or bruits Chest clear bilat to bases RRR no MRG Abd soft ntnd no mass or ascites +bs Ext no LE or UE edema, no other edema Neuro is alert, Ox 3 , nf    RUA AVF+bruit   Home bp meds: No bp lowering meds   OP HD: [facility] 4h  b400  79kg  2K bath  AVF  Hep 2000 Last HD 1/03, post wt 79.6kg Gets close to dry weight on  most/almost all treatments  Assessment/ Plan:  # ESRD - on HD TTS, last HD Saturday - Next HD tomorrow   # BP - Is not on BP lowering meds at home - follow blood pressure   # Volume - No volume excess on exam - UF 2-2.5 L with dialysis tomorrow as tol   # 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, no esa needs  # L hip greater trochanter fracture - further imaging  perhaps per orthopedics  - is ok to work w/ PT per ortho         Myer Fret  MD [facility] 03/26/2024, 3:59 PM  Recent Labs  Lab 03/25/24 1156 03/26/24 0225  HGB 15.6  --   CALCIUM 10.2 10.1  PHOS 2.9  --   CREATININE 6.05* 6.82*  K 4.3 4.4   Inpatient medications:  acetaminophen   1,000 mg Oral Q8H   amiodarone   200 mg Oral Daily   cinacalcet   30 mg Oral Q M,W,F-HD   heparin   5,000 Units Subcutaneous Q8H   pantoprazole   40 mg Oral Daily   PARoxetine   40 mg Oral Daily   pneumococcal 20-valent conjugate vaccine  0.5 mL Intramuscular Tomorrow-1000   senna-docusate  1 tablet Oral QHS   sodium chloride  flush  3 mL Intravenous Q12H   sodium chloride  flush  3 mL Intravenous Q12H   sucroferric oxyhydroxide  500 mg Oral TID WC   tamsulosin   0.4 mg Oral Daily    acetaminophen  **OR** acetaminophen , bisacodyl , hydrALAZINE , HYDROmorphone  (DILAUDID ) injection, ipratropium, ondansetron  **OR** ondansetron  (ZOFRAN ) IV, oxyCODONE , traZODone       [1] No Known Allergies

## 2024-03-26 NOTE — Progress Notes (Signed)
 "                                                                         Orthopaedic Trauma Service Progress Note  Patient ID: Christopher [name] MRN: [id number] DOB/AGE: 07/31/1950 73 y.o.  Subjective:  Doing ok Still with lateral left hip soreness Has not worked with therapies yet   Sagittal CT cuts are a little concerning for possible extension to the neck but I do not appreciate on the other projections.  Would like to obtain MRI  Ok to work with therapies in the interim and ok for diet    ROS As above  Today's  total administered Morphine Milligram Equivalents: 27.5 Yesterday's total administered Morphine Milligram Equivalents: 57.5  Objective:   VITALS:   Vitals:   03/25/24 2336 03/26/24 0401 03/26/24 0500 03/26/24 0725  BP: 103/66 134/73  108/69  Pulse: (!) 58 61  (!) 59  Resp: 18 18    Temp: (!) 97.3 F (36.3 C) (!) 97.4 F (36.3 C)  98.1 F (36.7 C)  TempSrc: Oral Oral  Oral  SpO2: 96% 95%  95%  Weight:   85.4 kg   Height:        Estimated body mass index is 27.8 kg/m as calculated from the following:   Height as of this encounter: 5' 9 (1.753 m).   Weight as of this encounter: 85.4 kg.   Intake/Output      01/04 0701 01/05 0700 01/05 0701 01/06 0700   P.O. 440    Total Intake(mL/kg) 440 (5.2)    Urine (mL/kg/hr) 750    Total Output 750    Net -310           LABS  Results for orders placed or performed during the hospital encounter of 03/25/24 (from the past 24 hours)  Basic metabolic panel     Status: Abnormal   Collection Time: 03/25/24 11:56 AM  Result Value Ref Range   Sodium 138 135 - 145 mmol/L   Potassium 4.3 3.5 - 5.1 mmol/L   Chloride 98 98 - 111 mmol/L   CO2 25 22 - 32 mmol/L   Glucose, Bld 90 70 - 99 mg/dL   BUN 38 (H) 8 - 23 mg/dL   Creatinine, Ser 3.94 (H) 0.61 - 1.24 mg/dL   Calcium 89.7 8.9 - 89.6 mg/dL   GFR, Estimated 9 (L) >60 mL/min   Anion gap 16 (H) 5 - 15  CBC with Differential     Status: Abnormal    Collection Time: 03/25/24 11:56 AM  Result Value Ref Range   WBC 10.4 4.0 - 10.5 K/uL   RBC 5.15 4.22 - 5.81 MIL/uL   Hemoglobin 15.6 13.0 - 17.0 g/dL   HCT 52.4 60.9 - 47.9 %   MCV 92.2 80.0 - 100.0 fL   MCH 30.3 26.0 - 34.0 pg   MCHC 32.8 30.0 - 36.0 g/dL   RDW 84.4 88.4 - 84.4 %   Platelets 264 150 - 400 K/uL   nRBC 0.0 0.0 - 0.2 %   Neutrophils Relative % 79 %   Neutro Abs 8.2 (H) 1.7 - 7.7 K/uL   Lymphocytes Relative 10 %   Lymphs Abs 1.0 0.7 -  4.0 K/uL   Monocytes Relative 10 %   Monocytes Absolute 1.0 0.1 - 1.0 K/uL   Eosinophils Relative 1 %   Eosinophils Absolute 0.1 0.0 - 0.5 K/uL   Basophils Relative 0 %   Basophils Absolute 0.0 0.0 - 0.1 K/uL   Immature Granulocytes 0 %   Abs Immature Granulocytes 0.04 0.00 - 0.07 K/uL  Protime-INR     Status: None   Collection Time: 03/25/24 11:56 AM  Result Value Ref Range   Prothrombin Time 15.2 11.4 - 15.2 seconds   INR 1.1 0.8 - 1.2  Magnesium     Status: None   Collection Time: 03/25/24 11:56 AM  Result Value Ref Range   Magnesium 2.1 1.7 - 2.4 mg/dL  Phosphorus     Status: None   Collection Time: 03/25/24 11:56 AM  Result Value Ref Range   Phosphorus 2.9 2.5 - 4.6 mg/dL  Basic metabolic panel     Status: Abnormal   Collection Time: 03/26/24  2:25 AM  Result Value Ref Range   Sodium 138 135 - 145 mmol/L   Potassium 4.4 3.5 - 5.1 mmol/L   Chloride 98 98 - 111 mmol/L   CO2 27 22 - 32 mmol/L   Glucose, Bld 76 70 - 99 mg/dL   BUN 51 (H) 8 - 23 mg/dL   Creatinine, Ser 3.17 (H) 0.61 - 1.24 mg/dL   Calcium 89.8 8.9 - 89.6 mg/dL   GFR, Estimated 8 (L) >60 mL/min   Anion gap 13 5 - 15  Protime-INR     Status: None   Collection Time: 03/26/24  2:25 AM  Result Value Ref Range   Prothrombin Time 15.0 11.4 - 15.2 seconds   INR 1.1 0.8 - 1.2  APTT     Status: None   Collection Time: 03/26/24  2:25 AM  Result Value Ref Range   aPTT 35 24 - 36 seconds     PHYSICAL EXAM:   Gen: resting comfortably in bed, NAD, pleasant   Lungs: unlabored  Ext:       Left Lower Extremity   No changes in exam from yesterday   Still no pain with axial loading  Laterally based pain with log rolling  Distal motor and sensory functions intact at at baseline   No DCT   Assessment/Plan:     Principal Problem:   Closed left hip fracture, initial encounter (HCC) Active Problems:   Primary hypertension   Transitional cell carcinoma of bladder 2007   Depression   Controlled atrial fibrillation (HCC)   GERD (gastroesophageal reflux disease)   ESRD (end stage renal disease) (HCC)   Anti-infectives (From admission, onward)    None     .  74 year old male with complex medical history s/p fall with left greater trochanter fracture     - Fall   - Left greater trochanter fracture               Some concerns with potential femoral neck extension on the sagittal cuts.  Will obtain MRI for further evaluation however patient can work with therapies.  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 at midnight if surgery is needed   - Dispo:               MRI of left hip for further evaluation however patient can continue to work with therapies   Francis MICAEL Mt, PA-C [phone] (C) 03/26/2024, 9:48 AM  [facility] [address] [place] [address] [phone] [phone] (F)    After 5pm and on the weekends please log on to Amion, go to orthopaedics and the look under the [facility] Call for the provider(s) on call. You can also call our office at [phone] and then follow the prompts to  be connected to the call team.  Patient ID: Christopher [name], male   DOB: 1950/12/21, 74 y.o.   MRN: [id number]  "

## 2024-03-26 NOTE — Evaluation (Signed)
 Occupational Therapy Evaluation Patient Details Name: Christopher [name] MRN: [id number] DOB: Apr 07, 1950 Today's Date: 03/26/2024   History of Present Illness   74 yo M adm 1/4 s/p fall with L greater trochanter fx, likely non-op management, WBAT, no active abduction; Ortho ordered MRI to r/o further fxs, results pending; PMH ESRD with HD, Afib on Eliquis , bladder carcinoma, depression anxiety, HTN     Clinical Impressions PTA Pt reports he was independent with functional mobility, as well as ADL and IADL tasks. Pt completes IADLs for wife at home. Pt currently requires up to Mod A +2 for functional transfers and up to Max A for ADL engagement. Pt primarily limited by decreased knowledge of precautions, pain, decreased activity tolerance, and unsteadiness on feet. OT to continue to follow Pt acutely to facilitate progress towards goals. Currently, Patient will benefit from continued inpatient follow up therapy, <3 hours/day but hopeful Pt could progress to home with [facility] services with acute therapies.      If plan is discharge home, recommend the following:   A lot of help with walking and/or transfers;A lot of help with bathing/dressing/bathroom;Assistance with cooking/housework;Assist for transportation;Help with stairs or ramp for entrance     Functional Status Assessment   Patient has had a recent decline in their functional status and demonstrates the ability to make significant improvements in function in a reasonable and predictable amount of time.     Equipment Recommendations   Other (comment) (defer)     Recommendations for Other Services         Precautions/Restrictions   Precautions Precautions: Fall;Other (comment) Recall of Precautions/Restrictions: Impaired Precaution/Restrictions Comments: WBAT, no active abduction Restrictions Weight Bearing Restrictions Per Provider Order: No LLE Weight Bearing Per Provider Order: Weight bearing as tolerated Other  Position/Activity Restrictions: No active abduction     Mobility Bed Mobility Overal bed mobility: Needs Assistance Bed Mobility: Supine to Sit     Supine to sit: +2 for physical assistance, Mod assist     General bed mobility comments: Step by step cues to move LEs closer to EOB while observing no active abduction; pt had good use of rails to pull trunk up, 2 person mod assist and use of bed pad to square off hips EOB    Transfers Overall transfer level: Needs assistance Equipment used: Rolling walker (2 wheels) Transfers: Sit to/from Stand, Bed to chair/wheelchair/BSC Sit to Stand: +2 physical assistance, Mod assist     Step pivot transfers: +2 physical assistance, Min assist, Mod assist     General transfer comment: Mod assist of 2 to rise and steady; shows good problem-solving with hand placement; step-by-step cues for sid/pivot steps bed to recliner on pt's r side, with close guard and observation for LLE to step inward towards RLE during step-pivot      Balance Overall balance assessment: Needs assistance Sitting-balance support: No upper extremity supported, Feet supported Sitting balance-Leahy Scale: Fair     Standing balance support: Bilateral upper extremity supported, During functional activity, Reliant on assistive device for balance Standing balance-Leahy Scale: Poor Standing balance comment: dependent on RW and external support                           ADL either performed or assessed with clinical judgement   ADL Overall ADL's : Needs assistance/impaired Eating/Feeding: Set up;Sitting   Grooming: Set up;Sitting   Upper Body Bathing: Contact guard assist;Sitting   Lower Body Bathing: Maximal assistance  Upper Body Dressing : Set up;Sitting   Lower Body Dressing: Maximal assistance   Toilet Transfer: Minimal assistance;Moderate assistance;+2 for physical assistance;+2 for safety/equipment;Stand-pivot;BSC/3in1;Rolling walker (2 wheels)    Toileting- Clothing Manipulation and Hygiene: Minimal assistance               Vision Patient Visual Report: No change from baseline Vision Assessment?: Wears glasses for reading Additional Comments: does not have glasses here     Perception         Praxis         Pertinent Vitals/Pain Pain Assessment Pain Assessment: 0-10 Pain Score: 4  Pain Location: LLE Pain Descriptors / Indicators: Grimacing, Guarding Pain Intervention(s): Limited activity within patient's tolerance, Monitored during session, RN gave pain meds during session     Extremity/Trunk Assessment Upper Extremity Assessment Upper Extremity Assessment: Overall WFL for tasks assessed   Lower Extremity Assessment Lower Extremity Assessment: Defer to PT evaluation RLE Deficits / Details: Pt described recent R knee shooting pain -- did not overtly notice this pain during mobility eval, and pt did not describe this pain during transfers LLE Deficits / Details: Noting grimace with moving LLE, especially with bed mobility; less noted overt pain with sit<>stand, weight bearing, and stepping LLe into adduction   Cervical / Trunk Assessment Cervical / Trunk Assessment: Normal   Communication Communication Communication: No apparent difficulties Factors Affecting Communication:  (minimally verbose)   Cognition Arousal: Alert Behavior During Therapy: WFL for tasks assessed/performed Cognition: No apparent impairments                               Following commands: Intact       Cueing  General Comments   Cueing Techniques: Verbal cues;Gestural cues  VSS on RA. Need to consider getting to/from HD 3x/week in dc planning, , as he typically drives himself   Exercises     Shoulder Instructions      Home Living Family/patient expects to be discharged to:: Private residence Living Arrangements: Spouse/significant other Available Help at Discharge: Family;Available PRN/intermittently Type  of Home: Mobile home Home Access: Stairs to enter Entrance Stairs-Number of Steps: 5 Entrance Stairs-Rails: Left Home Layout: Other (Comment)     Bathroom Shower/Tub: Producer, Television/film/video: Handicapped height Bathroom Accessibility: Yes   Home Equipment: Cane - single point   Additional Comments: Cares for wife and grandchildren at home      Prior Functioning/Environment Prior Level of Function : Independent/Modified Independent;Driving;History of Falls (last six months)             Mobility Comments: independent ADLs Comments: independent    OT Problem List: Decreased activity tolerance;Impaired balance (sitting and/or standing);Decreased safety awareness;Decreased knowledge of use of DME or AE;Decreased knowledge of precautions;Pain   OT Treatment/Interventions: Self-care/ADL training;Therapeutic exercise;Energy conservation;DME and/or AE instruction;Therapeutic activities;Patient/family education;Balance training      OT Goals(Current goals can be found in the care plan section)   Acute Rehab OT Goals Patient Stated Goal: to get better OT Goal Formulation: With patient Time For Goal Achievement: 04/09/24 Potential to Achieve Goals: Good ADL Goals Pt Will Perform Lower Body Bathing: with modified independence;with adaptive equipment Pt Will Perform Lower Body Dressing: with min assist;with adaptive equipment;sitting/lateral leans Pt Will Transfer to Toilet: with supervision;bedside commode Additional ADL Goal #1: Pt will engage in bed mobility with supervision as a precursor to ADL tasks OOB.   OT Frequency:  Min 2X/week  Co-evaluation PT/OT/SLP Co-Evaluation/Treatment: Yes Reason for Co-Treatment: Complexity of the patient's impairments (multi-system involvement);For patient/therapist safety;To address functional/ADL transfers   OT goals addressed during session: ADL's and self-care;Proper use of Adaptive equipment and DME      AM-PAC OT 6  Clicks Daily Activity     Outcome Measure Help from another person eating meals?: A Little Help from another person taking care of personal grooming?: A Little Help from another person toileting, which includes using toliet, bedpan, or urinal?: A Lot Help from another person bathing (including washing, rinsing, drying)?: A Lot Help from another person to put on and taking off regular upper body clothing?: A Little Help from another person to put on and taking off regular lower body clothing?: A Lot 6 Click Score: 15   End of Session Equipment Utilized During Treatment: Gait belt;Rolling walker (2 wheels) Nurse Communication: Mobility status  Activity Tolerance: Patient tolerated treatment well Patient left: in chair;with call bell/phone within reach;with chair alarm set  OT Visit Diagnosis: Unsteadiness on feet (R26.81);History of falling (Z91.81);Muscle weakness (generalized) (M62.81);Repeated falls (R29.6);Pain Pain - Right/Left: Left Pain - part of body: Hip                Time: 8774-8692 OT Time Calculation (min): 42 min Charges:  OT General Charges $OT Visit: 1 Visit OT Evaluation $OT Eval Low Complexity: 1 Low  Maurilio CROME, OTR/L.  [facility]  Office: [phone]   Maurilio PARAS Gage Weant 03/26/2024, 4:14 PM

## 2024-03-27 ENCOUNTER — Inpatient Hospital Stay (HOSPITAL_COMMUNITY)

## 2024-03-27 ENCOUNTER — Inpatient Hospital Stay (HOSPITAL_COMMUNITY): Admitting: Anesthesiology

## 2024-03-27 ENCOUNTER — Encounter (HOSPITAL_COMMUNITY)
Admission: EM | Disposition: A | Discharge status: Skilled Nursing Facility | Payer: Self-pay | Source: Home / Self Care | Attending: Internal Medicine

## 2024-03-27 ENCOUNTER — Encounter (HOSPITAL_COMMUNITY): Payer: Self-pay | Admitting: Family Medicine

## 2024-03-27 DIAGNOSIS — Z87891 Personal history of nicotine dependence: Secondary | ICD-10-CM | POA: Diagnosis not present

## 2024-03-27 DIAGNOSIS — I120 Hypertensive chronic kidney disease with stage 5 chronic kidney disease or end stage renal disease: Secondary | ICD-10-CM | POA: Diagnosis not present

## 2024-03-27 DIAGNOSIS — N186 End stage renal disease: Secondary | ICD-10-CM

## 2024-03-27 DIAGNOSIS — S72145A Nondisplaced intertrochanteric fracture of left femur, initial encounter for closed fracture: Secondary | ICD-10-CM | POA: Diagnosis not present

## 2024-03-27 DIAGNOSIS — S72002A Fracture of unspecified part of neck of left femur, initial encounter for closed fracture: Secondary | ICD-10-CM | POA: Diagnosis not present

## 2024-03-27 HISTORY — PX: INTRAMEDULLARY (IM) NAIL INTERTROCHANTERIC: SHX5875

## 2024-03-27 LAB — BASIC METABOLIC PANEL WITH GFR
Anion gap: 13 (ref 5–15)
BUN: 72 mg/dL — ABNORMAL HIGH (ref 8–23)
CO2: 26 mmol/L (ref 22–32)
Calcium: 10.2 mg/dL (ref 8.9–10.3)
Chloride: 98 mmol/L (ref 98–111)
Creatinine, Ser: 8.24 mg/dL — ABNORMAL HIGH (ref 0.61–1.24)
GFR, Estimated: 6 mL/min — ABNORMAL LOW
Glucose, Bld: 100 mg/dL — ABNORMAL HIGH (ref 70–99)
Potassium: 5.7 mmol/L — ABNORMAL HIGH (ref 3.5–5.1)
Sodium: 137 mmol/L (ref 135–145)

## 2024-03-27 LAB — CBC
HCT: 44.8 % (ref 39.0–52.0)
Hemoglobin: 14.7 g/dL (ref 13.0–17.0)
MCH: 30.9 pg (ref 26.0–34.0)
MCHC: 32.8 g/dL (ref 30.0–36.0)
MCV: 94.1 fL (ref 80.0–100.0)
Platelets: 225 K/uL (ref 150–400)
RBC: 4.76 MIL/uL (ref 4.22–5.81)
RDW: 15.5 % (ref 11.5–15.5)
WBC: 7.5 K/uL (ref 4.0–10.5)
nRBC: 0 % (ref 0.0–0.2)

## 2024-03-27 LAB — EXPECTORATED SPUTUM ASSESSMENT W GRAM STAIN, RFLX TO RESP C

## 2024-03-27 MED ORDER — FENTANYL CITRATE (PF) 100 MCG/2ML IJ SOLN
25.0000 ug | INTRAMUSCULAR | Status: DC | PRN
  Administered 2024-03-27: 25 ug via INTRAVENOUS

## 2024-03-27 MED ORDER — LIDOCAINE 2% (20 MG/ML) 5 ML SYRINGE
INTRAMUSCULAR | Status: DC | PRN
  Administered 2024-03-27: 60 mg via INTRAVENOUS

## 2024-03-27 MED ORDER — SUGAMMADEX SODIUM 200 MG/2ML IV SOLN
INTRAVENOUS | Status: DC | PRN
  Administered 2024-03-27 (×2): 200 mg via INTRAVENOUS

## 2024-03-27 MED ORDER — 0.9 % SODIUM CHLORIDE (POUR BTL) OPTIME
TOPICAL | Status: DC | PRN
  Administered 2024-03-27: 1000 mL

## 2024-03-27 MED ORDER — HEPARIN SODIUM (PORCINE) 1000 UNIT/ML DIALYSIS: 1000.0000 [IU] | INTRAMUSCULAR | Status: DC | PRN

## 2024-03-27 MED ORDER — SODIUM CHLORIDE 0.9 % IV SOLN: INTRAVENOUS | Status: DC

## 2024-03-27 MED ORDER — ALTEPLASE 2 MG IJ SOLR: 2.0000 mg | Freq: Once | INTRAMUSCULAR | Status: DC | PRN

## 2024-03-27 MED ORDER — ONDANSETRON HCL 4 MG/2ML IJ SOLN
INTRAMUSCULAR | Status: DC | PRN
  Administered 2024-03-27: 4 mg via INTRAVENOUS

## 2024-03-27 MED ORDER — PENTAFLUOROPROP-TETRAFLUOROETH EX AERO: 1.0000 | INHALATION_SPRAY | CUTANEOUS | Status: DC | PRN

## 2024-03-27 MED ORDER — FENTANYL CITRATE (PF) 100 MCG/2ML IJ SOLN
INTRAMUSCULAR | Status: AC
  Filled 2024-03-27: qty 2

## 2024-03-27 MED ORDER — PROPOFOL 10 MG/ML IV BOLUS
INTRAVENOUS | Status: AC
  Filled 2024-03-27: qty 20

## 2024-03-27 MED ORDER — FENTANYL CITRATE (PF) 250 MCG/5ML IJ SOLN
INTRAMUSCULAR | Status: DC | PRN
  Administered 2024-03-27 (×4): 50 ug via INTRAVENOUS

## 2024-03-27 MED ORDER — CHLORHEXIDINE GLUCONATE 0.12 % MT SOLN
15.0000 mL | Freq: Once | OROMUCOSAL | Status: AC
  Administered 2024-03-27: 15 mL via OROMUCOSAL
  Filled 2024-03-27: qty 15

## 2024-03-27 MED ORDER — CEFAZOLIN SODIUM-DEXTROSE 2-4 GM/100ML-% IV SOLN
INTRAVENOUS | Status: AC
  Filled 2024-03-27: qty 100

## 2024-03-27 MED ORDER — OXYCODONE HCL 5 MG PO TABS: 5.0000 mg | ORAL_TABLET | Freq: Once | ORAL | Status: DC | PRN

## 2024-03-27 MED ORDER — LIDOCAINE HCL (PF) 1 % IJ SOLN: 5.0000 mL | INTRAMUSCULAR | Status: DC | PRN

## 2024-03-27 MED ORDER — PHENYLEPHRINE 80 MCG/ML (10ML) SYRINGE FOR IV PUSH (FOR BLOOD PRESSURE SUPPORT)
PREFILLED_SYRINGE | INTRAVENOUS | Status: DC | PRN
  Administered 2024-03-27 (×2): 160 ug via INTRAVENOUS

## 2024-03-27 MED ORDER — ANTICOAGULANT SODIUM CITRATE 4% (200MG/5ML) IV SOLN: 5.0000 mL | Status: DC | PRN

## 2024-03-27 MED ORDER — CEFAZOLIN SODIUM-DEXTROSE 2-3 GM-%(50ML) IV SOLR
INTRAVENOUS | Status: DC | PRN
  Administered 2024-03-27: 2 g via INTRAVENOUS

## 2024-03-27 MED ORDER — DEXAMETHASONE SOD PHOSPHATE PF 10 MG/ML IJ SOLN
INTRAMUSCULAR | Status: DC | PRN
  Administered 2024-03-27: 10 mg via INTRAVENOUS

## 2024-03-27 MED ORDER — SODIUM CHLORIDE 0.9 % IV SOLN: INTRAVENOUS | Status: DC | PRN

## 2024-03-27 MED ORDER — ONDANSETRON HCL 4 MG/2ML IJ SOLN: 4.0000 mg | Freq: Once | INTRAMUSCULAR | Status: DC | PRN

## 2024-03-27 MED ORDER — ROCURONIUM BROMIDE 10 MG/ML (PF) SYRINGE
PREFILLED_SYRINGE | INTRAVENOUS | Status: DC | PRN
  Administered 2024-03-27: 60 mg via INTRAVENOUS

## 2024-03-27 MED ORDER — ORAL CARE MOUTH RINSE: 15.0000 mL | Freq: Once | OROMUCOSAL | Status: AC

## 2024-03-27 MED ORDER — LIDOCAINE-PRILOCAINE 2.5-2.5 % EX CREA: 1.0000 | TOPICAL_CREAM | CUTANEOUS | Status: DC | PRN

## 2024-03-27 MED ORDER — PHENYLEPHRINE HCL-NACL 20-0.9 MG/250ML-% IV SOLN
INTRAVENOUS | Status: DC | PRN
  Administered 2024-03-27: 40 ug/min via INTRAVENOUS

## 2024-03-27 MED ORDER — PROPOFOL 10 MG/ML IV BOLUS
INTRAVENOUS | Status: DC | PRN
  Administered 2024-03-27: 100 mg via INTRAVENOUS

## 2024-03-27 MED ORDER — NEPRO/CARBSTEADY PO LIQD: 237.0000 mL | ORAL | Status: DC | PRN

## 2024-03-27 MED ORDER — OXYCODONE HCL 5 MG/5ML PO SOLN: 5.0000 mg | Freq: Once | ORAL | Status: DC | PRN

## 2024-03-27 NOTE — Progress Notes (Signed)
 " PROGRESS NOTE  Christopher [name] FMW:[id number] DOB: 1950/04/20 DOA: 03/25/2024 PCP: Purcell Emil Schanz, MD   LOS: 2 days   Brief narrative:  Christopher [name] is a  74 y.o male with history of ESRD (TTS), A-fib (on Eliquis ), HTN, anxiety, depression, bladder cancer, presented to hospital with dizziness after dialysis and had a mechanical fall with left hip pain.  Did not lose consciousness.  In the ED patient was slightly bradycardic.  Labs were normal except for creatinine elevation at 6.0.  X-ray of the left hip showed minimally displaced  greater trochanteric fracture of proximal left femur.  CT abdomen and pelvis showed minimally displaced fracture of the greater trochanter on the left.  Orthopedics was consulted and patient was out of the hospital for further evaluation and treatment.   Assessment/Plan: Principal Problem:   Closed left hip fracture, initial encounter [facility]) Active Problems:   Primary hypertension   Depression   Controlled atrial fibrillation (HCC)   Transitional cell carcinoma of bladder 2007   GERD (gastroesophageal reflux disease)   ESRD (end stage renal disease) (HCC)   * Closed left hip fracture, initial encounter Status post mechanical fall.  X-ray with minimally displaced greater trochanter fracture.  CT scan showing the same.  MRI of the hip was performed and patient underwent IM nailing of the left hip on 03/27/2024 by Dr. Celena,  Orthopedics.  Further management plan as per orthopedics.  Mild hyperkalemia.  Will be adjusted during hemodialysis.    persistent atrial fibrillation Rate well-controlled on amiodarone .  Eliquis  on hold.  Review of 2D echocardiogram from April 2023 with LV ejection fraction of 60 to 65%.  Depression Continue paroxetine .   Primary hypertension Fluctuates with dialysis.  Continue to monitor during hospitalization.   Transitional cell carcinoma of bladder 2007 In remission.   ESRD (end stage renal disease)  On  hemodialysis-Tuesdays, Thursdays, Saturdays.  Nephrology on board for hemodialysis needs.  Plan for hemodialysis after surgical intervention today.   DVT prophylaxis: heparin  injection 5,000 Units Start: 03/25/24 2200 SCDs Start: 03/25/24 1415   Disposition: Uncertain at this time will get PT OT and orthopedic evaluation, family leaning towards rehabitation at a facility if needed.  Status is: Inpatient Remains inpatient appropriate because: Pending clinical improvement    Code Status:     Code Status: Full Code  Family Communication: Patient's spouse at bedside on 03/26/24, I spoke with the patient's sister 03/28/2023.  Patient's sister wishes for rehabilitation after discharge.  Consultants: Orthopedics  Procedures: IM nailing of the left hip on 03/27/2024 by Dr. Celena orthopedics  Anti-infectives:   Anti-infectives (From admission, onward)    Start     Dose/Rate Route Frequency Ordered Stop   03/27/24 0755  ceFAZolin  (ANCEF ) 2-4 GM/100ML-% IVPB       Note to Pharmacy: [name]: cabinet override      03/27/24 0755 03/27/24 2014        Subjective: Today, patient was seen and examined at bedside.  Patient seen after surgical intervention.  Patient complains of mild left hip pain.  Denies any nausea vomiting shortness of breath dyspnea or chest pain.   Objective: Vitals:   03/27/24 1030 03/27/24 1043  BP: 134/81 138/70  Pulse: 67 67  Resp: 15 18  Temp: (!) 97.5 F (36.4 C) 98.3 F (36.8 C)  SpO2: 98% 94%    Intake/Output Summary (Last 24 hours) at 03/27/2024 1104 Last data filed at 03/27/2024 0812 Gross per 24 hour  Intake 910.69 ml  Output 550 ml  Net 360.69 ml   Filed Weights   03/26/24 0500 03/27/24 0500 03/27/24 0655  Weight: 85.4 kg 85.8 kg (P) 85.8 kg   Body mass index is 27.93 kg/m (pended).   Physical Exam:  GENERAL: Patient is alert awake and oriented. Not in obvious distress. HENT: No scleral pallor or icterus. Pupils equally reactive to  light. Oral mucosa is moist NECK: is supple, no gross swelling noted. CHEST: Clear to auscultation. No crackles or wheezes.  Diminished breath sounds bilaterally. CVS: S1 and S2 heard, no murmur. Regular rate and rhythm.  ABDOMEN: Soft, non-tender, bowel sounds are present. EXTREMITIES: No edema.  Surgical dressing over the left hip.  Right upper extremity fistula CNS: Cranial nerves are intact. No focal motor deficits. SKIN: warm and dry without rashes.  Data Review: I have personally reviewed the following laboratory data and studies,  CBC: Recent Labs  Lab 03/25/24 1156 03/27/24 0607  WBC 10.4 7.5  NEUTROABS 8.2*  --   HGB 15.6 14.7  HCT 47.5 44.8  MCV 92.2 94.1  PLT 264 225   Basic Metabolic Panel: Recent Labs  Lab 03/25/24 1156 03/26/24 0225 03/27/24 0607  NA 138 138 137  K 4.3 4.4 5.7*  CL 98 98 98  CO2 25 27 26   GLUCOSE 90 76 100*  BUN 38* 51* 72*  CREATININE 6.05* 6.82* 8.24*  CALCIUM 10.2 10.1 10.2  MG 2.1  --   --   PHOS 2.9  --   --    Liver Function Tests: No results for input(s): AST, ALT, ALKPHOS, BILITOT, PROT, ALBUMIN  in the last 168 hours. No results for input(s): LIPASE, AMYLASE in the last 168 hours. No results for input(s): AMMONIA in the last 168 hours. Cardiac Enzymes: No results for input(s): CKTOTAL, CKMB, CKMBINDEX, TROPONINI in the last 168 hours. BNP (last 3 results) No results for input(s): BNP in the last 8760 hours.  ProBNP (last 3 results) No results for input(s): PROBNP in the last 8760 hours.  CBG: No results for input(s): GLUCAP in the last 168 hours. Recent Results (from the past 240 hours)  MRSA Next Gen by PCR, Nasal     Status: None   Collection Time: 03/25/24  3:36 AM   Specimen: Nasal Mucosa; Nasal Swab  Result Value Ref Range Status   MRSA by PCR Next Gen NOT DETECTED NOT DETECTED Final    Comment: (NOTE) The GeneXpert MRSA Assay (FDA approved for NASAL specimens only), is one  component of a comprehensive MRSA colonization surveillance program. It is not intended to diagnose MRSA infection nor to guide or monitor treatment for MRSA infections. Test performance is not FDA approved in patients less than 66 years old. Performed at [facility], [address], [place], [place] [address]   Expectorated Sputum Assessment w Gram Stain, Rflx to Resp Cult     Status: None   Collection Time: 03/26/24 11:56 PM   Specimen: Expectorated Sputum  Result Value Ref Range Status   Specimen Description EXPECTORATED SPUTUM  Final   Special Requests NONE  Final   Sputum evaluation   Final    THIS SPECIMEN IS ACCEPTABLE FOR SPUTUM CULTURE Performed at [facility], [address], [place], [place] [address]    Report Status 03/27/2024 FINAL  Final  Culture, Respiratory w Gram Stain     Status: None (Preliminary result)   Collection Time: 03/26/24 11:56 PM  Result Value Ref Range Status   Specimen Description  EXPECTORATED SPUTUM  Final   Special Requests NONE Reflexed from [id number]  Final   Gram Stain   Final    MODERATE WBC PRESENT, PREDOMINANTLY PMN MODERATE GRAM POSITIVE COCCI Performed at [facility], [address] [place], [place] [address]    Culture PENDING  Incomplete   Report Status PENDING  Incomplete     Studies: DG C-Arm 1-60 Min-No Report Result Date: 03/27/2024 Fluoroscopy was utilized by the requesting physician.  No radiographic interpretation.   DG C-Arm 1-60 Min-No Report Result Date: 03/27/2024 Fluoroscopy was utilized by the requesting physician.  No radiographic interpretation.   MR HIP LEFT WO CONTRAST Result Date: 03/26/2024 EXAM: MRI of the left hip without contrast. 03/26/2024 12:23:00 PM TECHNIQUE: Multiplanar multisequence MRI of the left hip was performed without the administration of intravenous contrast. COMPARISON: None available. CLINICAL HISTORY: Hip trauma, fracture suspected, X-ray done; evaluation for nondisplaced  femoral neck fracture. FINDINGS: BONE MARROW: Nondisplaced intertrochanteric fracture of the left hip with a more dominant greater trochanteric component but with abnormal band of edema signal extending down towards the lesser trochanter as on image 9 series 8 and image 78 series 1. Adjacent edema tracks within and along the left gluteus medius and gluteus minimus musculature and within and along the left quadratus femoris muscle. Lesser degree of edema tracks along the remainder of the left hip adductor musculature as well as the distal left iliopsoas. HIP JOINT: Moderate degenerative chondral thinning in both hips. No significant joint effusion. No subchondral cysts. No significant degenerative disease. LABRUM: No gross labral tear observed. No paralabral cyst. BURSAE: No significant iliopsoas or trochanteric bursitis. SCIATIC NERVE: Some of the edema tracts along the anterior margin of the left sciatic nerve particularly posterior to the quadratus femoris. MUSCLES AND TENDONS: No gluteal tendon tear. Intact hamstrings tendon origin. No significant muscle atrophy. Edema tracks within and along the left gluteus medius and gluteus minimus musculature, within and along the left quadratus femoris muscle, along the remainder of the left hip adductor musculature, and along the distal left iliopsoas, related to the intertrochanteric fracture. SOFT TISSUES: No focal soft tissue mass. INTRAPELVIC CONTENTS: Mild sigmoid colon diverticulosis. Benign prostatic hypertrophy. IMPRESSION: 1. Nondisplaced intertrochanteric fracture of the left hip. Surrounding muscular edema as noted above. 2. Moderate degenerative chondral thinning of both hips. 3. Chronic incidental findings including mild sigmoid diverticulosis and benign prostatic hypertrophy. Electronically signed by: Ryan Salvage MD 03/26/2024 12:34 PM EST RP Workstation: [id number]   CT PELVIS WO CONTRAST Result Date: 03/25/2024 CLINICAL DATA:  Hip trauma, fracture  suspected. EXAM: CT PELVIS WITHOUT CONTRAST TECHNIQUE: Multidetector CT imaging of the pelvis was performed following the standard protocol without intravenous contrast. RADIATION DOSE REDUCTION: This exam was performed according to the departmental dose-optimization program which includes automated exposure control, adjustment of the mA and/or kV according to patient size and/or use of iterative reconstruction technique. COMPARISON:  03/25/2024. FINDINGS: Urinary Tract:  No abnormality visualized. Bowel: Diverticulosis without diverticulitis. No bowel obstruction is seen. The visualized portion of the appendix appears normal. Vascular/Lymphatic: No pathologically enlarged lymph nodes. No significant vascular abnormality seen. Reproductive:  Prostate gland is enlarged. Other:  No pelvic ascites. Musculoskeletal: There is a minimally displaced comminuted fracture of the greater trochanter. The remaining bony structures are intact. No dislocation is seen. There are mild degenerative changes at the hips bilaterally and lower lumbar spine. Soft tissue swelling is present adjacent to the greater trochanter on the left, possible edema or hematoma. IMPRESSION: 1. Minimally displaced  fracture of the greater trochanter on the left. 2. Diverticulosis without diverticulitis. 3. Enlarged prostate gland. Electronically Signed   By: Leita Birmingham M.D.   On: 03/25/2024 16:44   DG Chest 1 View Result Date: 03/25/2024 CLINICAL DATA:  Fall EXAM: CHEST  1 VIEW COMPARISON:  July 08, 2021 FINDINGS: Stable cardiomediastinal silhouette. Minimal right midlung subsegmental atelectasis or scarring is noted. Left lung is clear. Bony thorax is unremarkable. IMPRESSION: Minimal right midlung subsegmental atelectasis or scarring. Electronically Signed   By: Lynwood Landy Raddle M.D.   On: 03/25/2024 13:30   DG Hip Unilat With Pelvis 2-3 Views Left Result Date: 03/25/2024 CLINICAL DATA:  Left hip pain after fall EXAM: DG HIP (WITH OR WITHOUT  PELVIS) 2-3V LEFT COMPARISON:  None Available. FINDINGS: Minimally displaced fracture is seen involving the greater trochanter of the proximal left femur. Mild narrowing of left hip is noted. IMPRESSION: Minimally displaced greater trochanteric fracture of proximal left femur. CT scan may be performed to evaluate for more extensive fracture than visualized. Electronically Signed   By: Lynwood Landy Raddle M.D.   On: 03/25/2024 13:28      Vernal Alstrom, MD  [facility] 03/27/2024  If 7PM-7AM, please contact night-coverage             "

## 2024-03-27 NOTE — Progress Notes (Signed)
 Pt receives out-pt HD at [facility] on TTS 5:00 am chair time. Will assist as needed.   Randine Mungo Dialysis Navigator [phone]

## 2024-03-27 NOTE — Progress Notes (Signed)
" °   03/27/24 1732  Vitals  Temp 97.7 F (36.5 C)  Pulse Rate 72  Resp 15  BP 123/75  SpO2 98 %  O2 Device Room Air  Weight 84.1 kg  Type of Weight Post-Dialysis  Oxygen Therapy  Pulse Oximetry Type Continuous  Oximetry Probe Site Changed No  Post Treatment  Dialyzer Clearance Lightly streaked  Hemodialysis Intake (mL) 0 mL  Liters Processed 69.9  Fluid Removed (mL) 1700 mL  Tolerated HD Treatment Yes  AVG/AVF Arterial Site Held (minutes) 10 minutes  AVG/AVF Venous Site Held (minutes) 10 minutes   Received patient in bed to unit.  Alert and oriented.  Informed consent signed and in chart.   TX duration:3.0---nephrology is aware--made the change in the time  Patient tolerated well.  Transported back to the room  Alert, without acute distress.  Hand-off given to patient's nurse.   Access used: RUAF Access issues: no complications  Total UF removed: 1700 Medication(s) given: none   [name] "

## 2024-03-27 NOTE — Anesthesia Procedure Notes (Signed)
 Procedure Name: Intubation Date/Time: 03/27/2024 8:11 AM  Performed by: Scherrie Mast, CRNAPre-anesthesia Checklist: Patient identified, Emergency Drugs available, Suction available and Patient being monitored Patient Re-evaluated:Patient Re-evaluated prior to induction Oxygen Delivery Method: Circle System Utilized Preoxygenation: Pre-oxygenation with 100% oxygen Induction Type: IV induction Ventilation: Mask ventilation without difficulty Laryngoscope Size: Mac and 4 Grade View: Grade I Tube type: Oral Tube size: 7.0 mm Number of attempts: 1 Airway Equipment and Method: Stylet and Oral airway Placement Confirmation: ETT inserted through vocal cords under direct vision, positive ETCO2 and breath sounds checked- equal and bilateral Secured at: 21 cm Tube secured with: Tape Dental Injury: Teeth and Oropharynx as per pre-operative assessment

## 2024-03-27 NOTE — Transfer of Care (Signed)
 Immediate Anesthesia Transfer of Care Note  Patient: Christopher [name]  Procedure(s) Performed: FIXATION, FRACTURE, INTERTROCHANTERIC, WITH INTRAMEDULLARY ROD (Left: Hip)  Patient Location: PACU  Anesthesia Type:General  Level of Consciousness: awake, alert , and oriented  Airway & Oxygen Therapy: Patient Spontanous Breathing and Patient connected to nasal cannula oxygen  Post-op Assessment: Report given to RN and Post -op Vital signs reviewed and stable  Post vital signs: Reviewed and stable  Last Vitals:  Vitals Value Taken Time  BP 129/76 03/27/24 09:30  Temp 36.2 C 03/27/24 09:30  Pulse 68 03/27/24 09:37  Resp 16 03/27/24 09:37  SpO2 95 % 03/27/24 09:37  Vitals shown include unfiled device data.  Last Pain:  Vitals:   03/27/24 0930  TempSrc:   PainSc: 0-No pain      Patients Stated Pain Goal: 0 (03/26/24 2000)  Complications: No notable events documented.

## 2024-03-27 NOTE — Anesthesia Postprocedure Evaluation (Signed)
"   Anesthesia Post Note  Patient: Christopher [name]  Procedure(s) Performed: FIXATION, FRACTURE, INTERTROCHANTERIC, WITH INTRAMEDULLARY ROD (Left: Hip)     Patient location during evaluation: PACU Anesthesia Type: General Level of consciousness: awake and alert and oriented Pain management: pain level controlled Vital Signs Assessment: post-procedure vital signs reviewed and stable Respiratory status: spontaneous breathing, nonlabored ventilation and respiratory function stable Cardiovascular status: blood pressure returned to baseline and stable Postop Assessment: no apparent nausea or vomiting Anesthetic complications: no   There were no known notable events for this encounter.  Last Vitals:  Vitals:   03/27/24 1000 03/27/24 1015  BP: 119/70 127/70  Pulse: 66 66  Resp: 11 13  Temp:    SpO2: 98% 98%    Last Pain:  Vitals:   03/27/24 1000  TempSrc:   PainSc: 0-No pain                 Miria Cappelli A.      "

## 2024-03-27 NOTE — Progress Notes (Signed)
 [facility] Progress Note  Subjective:  Seen in HD unit  Presentation summary: pending  Vitals:   03/27/24 1430 03/27/24 1500 03/27/24 1530 03/27/24 1600  BP: 128/77 110/75 112/74 99/68  Pulse: (!) 59 63 68 68  Resp: 12 11 11 11   Temp:      TempSrc:      SpO2: 98% 98% 99% 99%  Weight:      Height:        Exam: Gen alert, no distress, on HD now Sclera anicteric, throat clear  No jvd or bruits Chest clear bilat to bases RRR no MRG Abd soft ntnd no mass or ascites +bs Ext no LE or UE edema, no other edema Neuro is alert, Ox 3 , nf    RUA AVF+bruit    Home bp meds: No bp lowering meds    [facility] HD: [facility] 4[facility]  b400  79kg  2K bath  AVF  Hep 2000 Last HD 1/03, post wt 79.6kg Gets close to dry weight on most/almost all treatments   Assessment/ Plan:   # ESRD - on HD TTS, last HD Saturday - next HD today, is on this afternoon     # BP - Is not on BP lowering meds at home - follow blood pressure     # Volume - No volume excess on exam - UF 2 L with dialysis as tol     # 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 Inpatient medications:  acetaminophen   1,000 mg Oral Q8H   amiodarone   200 mg Oral Daily   Chlorhexidine  Gluconate Cloth  6 each Topical Q0600   cinacalcet   30 mg Oral Q M,W,F-HD   heparin   5,000 Units Subcutaneous Q8H   pantoprazole   40 mg Oral Daily   PARoxetine   40 mg Oral Daily   pneumococcal 20-valent conjugate vaccine  0.5 mL Intramuscular Tomorrow-1000   senna-docusate  1 tablet Oral QHS   sodium chloride  flush  3 mL Intravenous Q12H   sodium chloride   flush  3 mL Intravenous Q12H   sucroferric oxyhydroxide  500 mg Oral TID WC   tamsulosin   0.4 mg Oral Daily    albumin  human     anticoagulant sodium citrate      ceFAZolin      acetaminophen  **OR** acetaminophen , albumin  human, alteplase , anticoagulant sodium citrate , bisacodyl , ceFAZolin , feeding supplement (NEPRO CARB STEADY), heparin , hydrALAZINE , HYDROmorphone  (DILAUDID ) injection, ipratropium, lidocaine  (PF), lidocaine -prilocaine , ondansetron  **OR** ondansetron  (ZOFRAN ) IV, oxyCODONE , pentafluoroprop-tetrafluoroeth, traZODone 

## 2024-03-27 NOTE — Op Note (Signed)
 PATIENT:  Christopher [name]  Dec 15, 1950 male   MEDICAL RECORD NUMBER: [id number]  PRE-OPERATIVE DIAGNOSIS:  NONDISPLACED LEFT HIP INTERTROCHANTERIC FRACTURE  POST-OPERATIVE DIAGNOSIS:  NONDISPLACED LEFT HIP INTERTROCHANTERIC FRACTURE  PROCEDURE:  INTRAMEDULLARY NAILING OF THE LEFT HIP using a [employer] Affixus nail 11 x 180 mm diameter locked short.  SURGEON:  Ozell DEL. Celena, M.D.  ASSISTANT:  1. Francis Mt, PA-C, 2. PA Student.  ANESTHESIA:  General.  COMPLICATIONS:  None.  ESTIMATED BLOOD LOSS:  Less than 50 mL.  DISPOSITION:  To PACU.  CONDITION:  Stable.  DELAY START OF DVT PROPHYLAXIS BECAUSE OF BLEEDING RISK: NO  BRIEF SUMMARY AND INDICATION OF PROCEDURE:  [name] is a 74 y.o. year- old with multiple medical problems including renal dialysis.  I discussed with the patient and family risks and benefits of surgical treatment including the potential for malunion, nonunion, symptomatic hardware, heart attack, stroke, neurovascular injury, bleeding, and others.  After acknowledgement of these risks, consent was provided to proceed.  BRIEF SUMMARY OF PROCEDURE:  The patient was taken to the operating room where general anesthesia was induced.  He was positioned supine on the radiolucent table. A thorough scrub and wash with chlorhexidine  and then Betadine scrub and paint was performed.  After sterile drapes and time-out, a long instrument was used to identify the appropriate starting position under C-arm on both AP and lateral images.  A 3 cm incision was made proximal to the greater trochanter.  The curved cannulated awl was inserted just medial to the tip of the lateral trochanter and then the starting guidewire advanced into the proximal femur.  This was checked on AP and lateral views.  The starting reamer was engaged with the soft tissue protected by a sleeve.  The short nail was then inserted to the appropriate depth.  The guidewire for the lag screw was then  inserted with appropriate anteversion to make sure it was in a center-center position. The lag screw pin was measured and the lag screw placed with excellent purchase and position checked on both views.  The set screw was then engaged within the groove of the lag screw, which was allowed to telescope. This was followed by placement of one distal locking screw off the jig.  This was confirmed on AP and lateral images. Wounds were irrigated thoroughly, closed in a standard layered fashion. Sterile gently compressive dressings were applied. Francis Mt, PA-C assisted throughout and a PA student also assisted.  The patient was awakened from anesthesia and transported to the PACU in stable condition.  PROGNOSIS:  The patient will be weightbearing as tolerated with physical Therapy. DVT prophylaxis will be with subcu heparin , restarting Eliquis .  There are no range of motion precautions.  We will continue to follow while in the hospital.  Anticipate follow up in the office in 2 weeks for removal of sutures and new x-rays. Anticipate rehab eval and possible stay given his function and challenging home demands.   Ozell DEL. Celena, M.D.

## 2024-03-27 NOTE — Plan of Care (Signed)
   Problem: Coping: Goal: Level of anxiety will decrease Outcome: Progressing   Problem: Pain Managment: Goal: General experience of comfort will improve and/or be controlled Outcome: Progressing   Problem: Safety: Goal: Ability to remain free from injury will improve Outcome: Progressing

## 2024-03-27 NOTE — Progress Notes (Addendum)
 No changes overnight. K a little elevated this am.  The risks and benefits of left intertrochanteric hip fracture repair were discussed with the patient, including the possibility of infection, nerve injury, vessel injury, wound breakdown, arthritis, symptomatic hardware, DVT/ PE, loss of motion, malunion, nonunion, and need for further surgery among others.  These risks were acknowledged and consent was provided to proceed.  Ozell Bruch, MD [facility], [facility] [phone]

## 2024-03-28 ENCOUNTER — Encounter (HOSPITAL_COMMUNITY): Payer: Self-pay | Admitting: Orthopedic Surgery

## 2024-03-28 DIAGNOSIS — S72002A Fracture of unspecified part of neck of left femur, initial encounter for closed fracture: Secondary | ICD-10-CM | POA: Diagnosis not present

## 2024-03-28 LAB — BASIC METABOLIC PANEL WITH GFR
Anion gap: 13 (ref 5–15)
BUN: 56 mg/dL — ABNORMAL HIGH (ref 8–23)
CO2: 27 mmol/L (ref 22–32)
Calcium: 10 mg/dL (ref 8.9–10.3)
Chloride: 95 mmol/L — ABNORMAL LOW (ref 98–111)
Creatinine, Ser: 7.01 mg/dL — ABNORMAL HIGH (ref 0.61–1.24)
GFR, Estimated: 8 mL/min — ABNORMAL LOW
Glucose, Bld: 126 mg/dL — ABNORMAL HIGH (ref 70–99)
Potassium: 4.7 mmol/L (ref 3.5–5.1)
Sodium: 135 mmol/L (ref 135–145)

## 2024-03-28 LAB — HEPATITIS B SURFACE ANTIBODY, QUANTITATIVE: Hep B S AB Quant (Post): 231 m[IU]/mL

## 2024-03-28 LAB — CBC
HCT: 40.2 % (ref 39.0–52.0)
Hemoglobin: 13.6 g/dL (ref 13.0–17.0)
MCH: 30.9 pg (ref 26.0–34.0)
MCHC: 33.8 g/dL (ref 30.0–36.0)
MCV: 91.4 fL (ref 80.0–100.0)
Platelets: 242 K/uL (ref 150–400)
RBC: 4.4 MIL/uL (ref 4.22–5.81)
RDW: 15.1 % (ref 11.5–15.5)
WBC: 11.7 K/uL — ABNORMAL HIGH (ref 4.0–10.5)
nRBC: 0 % (ref 0.0–0.2)

## 2024-03-28 MED ORDER — APIXABAN 5 MG PO TABS
5.0000 mg | ORAL_TABLET | Freq: Two times a day (BID) | ORAL | Status: DC
  Administered 2024-03-28 – 2024-04-03 (×13): 5 mg via ORAL
  Filled 2024-03-28 (×13): qty 1

## 2024-03-28 MED ORDER — CHLORHEXIDINE GLUCONATE CLOTH 2 % EX PADS
6.0000 | MEDICATED_PAD | Freq: Every day | CUTANEOUS | Status: DC
  Administered 2024-03-29 – 2024-03-30 (×2): 6 via TOPICAL

## 2024-03-28 NOTE — Progress Notes (Addendum)
 " PROGRESS NOTE  Christopher [name] FMW:[id number] DOB: 1950-06-27 DOA: 03/25/2024 PCP: Purcell Emil Schanz, MD   LOS: 3 days   Brief narrative:  Christopher [name] is a  74 y.o male with history of ESRD (TTS), A-fib (on Eliquis ), HTN, anxiety, depression, bladder cancer, presented to hospital with dizziness after dialysis and had a mechanical fall with left hip pain.  Did not lose consciousness.  In the ED patient was slightly bradycardic.  Labs were normal except for creatinine elevation at 6.0.  X-ray of the left hip showed minimally displaced  greater trochanteric fracture of proximal left femur.  CT abdomen and pelvis showed minimally displaced fracture of the greater trochanter on the left.  Orthopedics was consulted and patient was out of the hospital for further evaluation and treatment.   Assessment/Plan: Principal Problem:   Closed left hip fracture, initial encounter [name]) Active Problems:   Primary hypertension   Depression   Controlled atrial fibrillation (HCC)   Transitional cell carcinoma of bladder 2007   GERD (gastroesophageal reflux disease)   ESRD (end stage renal disease) (HCC)   * Closed left hip fracture, initial encounter Status post mechanical fall.  X-ray with minimally displaced greater trochanter fracture.  CT scan showing the same.  MRI of the hip was performed and patient underwent IM nailing of the left hip on 03/27/2024 by Dr. Celena,  Orthopedics.  Further management plan as per orthopedics.  PT OT to follow the patient today.  Might need rehabilitation.  Mild hyperkalemia.  Will be adjusted during hemodialysis.    persistent atrial fibrillation Rate well-controlled on amiodarone .  Okay to restart Eliquis  as per orthopedics.  Review of 2D echocardiogram from April 2023 with LV ejection fraction of 60 to 65%.  Depression Continue paroxetine .   Primary hypertension Fluctuates with dialysis.  Continue to monitor during hospitalization.   Transitional cell  carcinoma of bladder 2007 In remission.   ESRD (end stage renal disease)  On hemodialysis-Tuesdays, Thursdays, Saturdays.  Nephrology on board for hemodialysis needs.  Plan for hemodialysis after surgical intervention today.  Bilateral foot tingling.  Might be neuropathy.  He was supposed to follow-up with somebody as outpatient.   DVT prophylaxis: heparin  injection 5,000 Units Start: 03/25/24 2200 SCDs Start: 03/25/24 1415 Will restart Eliquis   Disposition: Likely need rehab medicine.  PT OT pending.  Status is: Inpatient Remains inpatient appropriate because: Pending PT evaluation.    Code Status:     Code Status: Full Code  Family Communication: Patient's spouse at bedside on 03/26/24, I spoke with the patient's sister 03/28/2023.   Consultants: Orthopedics  Procedures: IM nailing of the left hip on 03/27/2024 by Dr. Celena orthopedics  Anti-infectives:   Anti-infectives (From admission, onward)    Start     Dose/Rate Route Frequency Ordered Stop   03/27/24 0755  ceFAZolin  (ANCEF ) 2-4 GM/100ML-% IVPB       Note to Pharmacy: [name]: cabinet override      03/27/24 0755 03/27/24 2014        Subjective: Today, patient was seen and examined at bedside.  Patient complains of mild tingling over the feet.  Denies any shortness of breath dyspnea chest pain nausea vomiting fever or chills.    Objective: Vitals:   03/28/24 0422 03/28/24 0719  BP: 138/76 126/81  Pulse: 66 64  Resp: 18   Temp: 98.9 F (37.2 C) 98.4 F (36.9 C)  SpO2: 93% 93%    Intake/Output Summary (Last 24 hours) at 03/28/2024 0944  Last data filed at 03/28/2024 0600 Gross per 24 hour  Intake 600 ml  Output 1820 ml  Net -1220 ml   Filed Weights   03/27/24 0655 03/27/24 1409 03/27/24 1732  Weight: (P) 85.8 kg 85.8 kg 84.1 kg   Body mass index is 27.38 kg/m (pended).   Physical Exam:  General:  Average built, not in obvious distress, Communicative HENT:   No scleral pallor or icterus  noted. Oral mucosa is moist.  Chest:  Clear breath sounds.  Diminished breath sounds bilaterally. No crackles or wheezes.  CVS: S1 &S2 heard. No murmur.  Regular rate and rhythm. Abdomen: Soft, nontender, nondistended.  Bowel sounds are heard.   Extremities: No cyanosis, clubbing  Surgical dressing over the left hip.  Peripheral pulses are palpable.  Right upper extremity AV fistula. Psych: Alert, awake and oriented, normal mood CNS:  No cranial nerve deficits.  Moves extremities. Skin: Warm and dry.  No rashes noted.   Data Review: I have personally reviewed the following laboratory data and studies,  CBC: Recent Labs  Lab 03/25/24 1156 03/27/24 0607 03/28/24 0503  WBC 10.4 7.5 11.7*  NEUTROABS 8.2*  --   --   HGB 15.6 14.7 13.6  HCT 47.5 44.8 40.2  MCV 92.2 94.1 91.4  PLT 264 225 242   Basic Metabolic Panel: Recent Labs  Lab 03/25/24 1156 03/26/24 0225 03/27/24 0607 03/28/24 0503  NA 138 138 137 135  K 4.3 4.4 5.7* 4.7  CL 98 98 98 95*  CO2 25 27 26 27   GLUCOSE 90 76 100* 126*  BUN 38* 51* 72* 56*  CREATININE 6.05* 6.82* 8.24* 7.01*  CALCIUM 10.2 10.1 10.2 10.0  MG 2.1  --   --   --   PHOS 2.9  --   --   --    Liver Function Tests: No results for input(s): AST, ALT, ALKPHOS, BILITOT, PROT, ALBUMIN  in the last 168 hours. No results for input(s): LIPASE, AMYLASE in the last 168 hours. No results for input(s): AMMONIA in the last 168 hours. Cardiac Enzymes: No results for input(s): CKTOTAL, CKMB, CKMBINDEX, TROPONINI in the last 168 hours. BNP (last 3 results) No results for input(s): BNP in the last 8760 hours.  ProBNP (last 3 results) No results for input(s): PROBNP in the last 8760 hours.  CBG: No results for input(s): GLUCAP in the last 168 hours. Recent Results (from the past 240 hours)  MRSA Next Gen by PCR, Nasal     Status: None   Collection Time: 03/25/24  3:36 AM   Specimen: Nasal Mucosa; Nasal Swab  Result Value  Ref Range Status   MRSA by PCR Next Gen NOT DETECTED NOT DETECTED Final    Comment: (NOTE) The GeneXpert MRSA Assay (FDA approved for NASAL specimens only), is one component of a comprehensive MRSA colonization surveillance program. It is not intended to diagnose MRSA infection nor to guide or monitor treatment for MRSA infections. Test performance is not FDA approved in patients less than 60 years old. Performed at [facility], [address] [place], [place] [address]   Expectorated Sputum Assessment w Gram Stain, Rflx to Resp Cult     Status: None   Collection Time: 03/26/24 11:56 PM   Specimen: Expectorated Sputum  Result Value Ref Range Status   Specimen Description EXPECTORATED SPUTUM  Final   Special Requests NONE  Final   Sputum evaluation   Final    THIS SPECIMEN IS ACCEPTABLE FOR SPUTUM CULTURE  Performed at [facility], [address] [place], [place] [address]    Report Status 03/27/2024 FINAL  Final  Culture, Respiratory w Gram Stain     Status: None (Preliminary result)   Collection Time: 03/26/24 11:56 PM  Result Value Ref Range Status   Specimen Description EXPECTORATED SPUTUM  Final   Special Requests NONE Reflexed from [id number]  Final   Gram Stain   Final    MODERATE WBC PRESENT, PREDOMINANTLY PMN MODERATE GRAM POSITIVE COCCI Performed at [facility], [address] [place], [place] [address]    Culture PENDING  Incomplete   Report Status PENDING  Incomplete     Studies: DG HIP UNILAT WITH PELVIS 2-3 VIEWS LEFT Result Date: 03/27/2024 CLINICAL DATA:  Operative fixation of a left intertrochanteric fracture. EXAM: DG HIP (WITH OR WITHOUT PELVIS) 2-3V*L* COMPARISON:  C-arm images obtained earlier today, left hip MR dated 03/26/2024 and left hip CT and radiographs dated 03/25/2024. FINDINGS: Intramedullary nail and screw fixation of the recently demonstrated left intertrochanteric fracture with anatomic position and alignment. The bones appear  osteopenic. IMPRESSION: Anatomic position and alignment of the recently demonstrated left intertrochanteric fracture following internal fixation. Electronically Signed   By: Elspeth Bathe M.D.   On: 03/27/2024 16:04   DG HIP UNILAT WITH PELVIS 2-3 VIEWS LEFT Result Date: 03/27/2024 CLINICAL DATA:  Hardware fixation of a left intertrochanteric fracture. EXAM: DG HIP (WITH OR WITHOUT PELVIS) 2-3V*L* COMPARISON:  Left hip radiographs and CT dated 03/25/2024 and left hip MRI dated 03/26/2024. FINDINGS: Four C-arm images of the left hip and proximal femur demonstrate nail and screw fixation of the recently demonstrated left intertrochanteric fracture. Anatomic position and alignment of the fracture fragments. IMPRESSION: Hardware fixation of the recently demonstrated left intertrochanteric fracture with anatomic position and alignment of the fracture fragments. Electronically Signed   By: Elspeth Bathe M.D.   On: 03/27/2024 16:03   DG C-Arm 1-60 Min-No Report Result Date: 03/27/2024 Fluoroscopy was utilized by the requesting physician.  No radiographic interpretation.   DG C-Arm 1-60 Min-No Report Result Date: 03/27/2024 Fluoroscopy was utilized by the requesting physician.  No radiographic interpretation.   MR HIP LEFT WO CONTRAST Result Date: 03/26/2024 EXAM: MRI of the left hip without contrast. 03/26/2024 12:23:00 PM TECHNIQUE: Multiplanar multisequence MRI of the left hip was performed without the administration of intravenous contrast. COMPARISON: None available. CLINICAL HISTORY: Hip trauma, fracture suspected, X-ray done; evaluation for nondisplaced femoral neck fracture. FINDINGS: BONE MARROW: Nondisplaced intertrochanteric fracture of the left hip with a more dominant greater trochanteric component but with abnormal band of edema signal extending down towards the lesser trochanter as on image 9 series 8 and image 78 series 1. Adjacent edema tracks within and along the left gluteus medius and gluteus  minimus musculature and within and along the left quadratus femoris muscle. Lesser degree of edema tracks along the remainder of the left hip adductor musculature as well as the distal left iliopsoas. HIP JOINT: Moderate degenerative chondral thinning in both hips. No significant joint effusion. No subchondral cysts. No significant degenerative disease. LABRUM: No gross labral tear observed. No paralabral cyst. BURSAE: No significant iliopsoas or trochanteric bursitis. SCIATIC NERVE: Some of the edema tracts along the anterior margin of the left sciatic nerve particularly posterior to the quadratus femoris. MUSCLES AND TENDONS: No gluteal tendon tear. Intact hamstrings tendon origin. No significant muscle atrophy. Edema tracks within and along the left gluteus medius and gluteus minimus musculature, within and along  the left quadratus femoris muscle, along the remainder of the left hip adductor musculature, and along the distal left iliopsoas, related to the intertrochanteric fracture. SOFT TISSUES: No focal soft tissue mass. INTRAPELVIC CONTENTS: Mild sigmoid colon diverticulosis. Benign prostatic hypertrophy. IMPRESSION: 1. Nondisplaced intertrochanteric fracture of the left hip. Surrounding muscular edema as noted above. 2. Moderate degenerative chondral thinning of both hips. 3. Chronic incidental findings including mild sigmoid diverticulosis and benign prostatic hypertrophy. Electronically signed by: Ryan Salvage MD 03/26/2024 12:34 PM EST RP Workstation: [id number]      Vernal Alstrom, MD  [facility] 03/28/2024  If 7PM-7AM, please contact night-coverage             "

## 2024-03-28 NOTE — Progress Notes (Signed)
 RE:   Christopher [name]      Date of Birth:  [birth year]    Date: 03/28/24  To Whom It May Concern:  Please be advised that the above-named patient will require a short-term nursing home stay - anticipated 30 days or less for rehabilitation and strengthening.  The plan is for return home.                 MD signature                Date

## 2024-03-28 NOTE — Progress Notes (Signed)
 Physical Therapy Treatment Patient Details Name: Christopher [name] MRN: [id number] DOB: 08/27/1950 Today's Date: 03/28/2024   History of Present Illness 74 yo M adm 1/4 s/p fall with L greater trochanter fx, likely non-op management, WBAT, no active abduction; Ortho ordered MRI to r/o further fxs and found L intertrochate4ric fx, s/p Surgical fixation, still WBAT with no active abduction; PMH ESRD with HD, Afib on Eliquis , bladder carcinoma, depression anxiety, HTN    PT Comments  Continuing work on functional mobility and activity tolerance;  Noting surgical repair of intertrochanteric fx yesterday, WBAT and continuing no active abduction; Recieved pt in recliner just after OT, but with pain managed well; session focused on functional transfers and initial gait training, with very good results; showing solid carryover of hand placement and LE positioning cues for sit<>stand, and able to walk short, in-room distance with RW; continue to recommend post-acute rehab to maximize independence and safety with mobility and ADLs to prep for getting home to family   If plan is discharge home, recommend the following: A lot of help with walking and/or transfers;A lot of help with bathing/dressing/bathroom   Can travel by private vehicle     No  Equipment Recommendations  Rolling walker (2 wheels);BSC/3in1 (would need transportation to/from HD)    Recommendations for Other Services       Precautions / Restrictions Precautions Precautions: Fall;Other (comment) Recall of Precautions/Restrictions:  (Improved a lot) Precaution/Restrictions Comments: WBAT, no active abduction Restrictions LLE Weight Bearing Per Provider Order: Weight bearing as tolerated Other Position/Activity Restrictions: No active abduction     Mobility  Bed Mobility               General bed mobility comments: PT recieved pt in recliner    Transfers Overall transfer level: Needs assistance Equipment used: Rolling  walker (2 wheels) Transfers: Sit to/from Stand, Bed to chair/wheelchair/BSC Sit to Stand: Min assist           General transfer comment: Min assist mostly to steady RW, and cues to preposition LLE for comfort; smooth rise; cues for hand placement and control with stand to sit    Ambulation/Gait Ambulation/Gait assistance: Min assist, Contact guard assist Gait Distance (Feet): 20 Feet (5+15) Assistive device: Rolling walker (2 wheels) Gait Pattern/deviations: Step-to pattern, Trunk flexed       General Gait Details: Cues for gait sequence, and to keep steps in sagittal plane orientation; further lowered RW to allow for pt to bear more of his bodyweight into RW as needed   Stairs             Wheelchair Mobility     Tilt Bed    Modified Rankin (Stroke Patients Only)       Balance     Sitting balance-Leahy Scale: Fair       Standing balance-Leahy Scale: Poor                              Communication Communication Communication: No apparent difficulties  Cognition Arousal: Alert Behavior During Therapy: WFL for tasks assessed/performed                           PT - Cognition Comments: Better able to describe freater trochateric precautions to this PT at end of session Following commands: Intact      Cueing Cueing Techniques: Verbal cues, Tactile cues  Exercises      General  Comments General comments (skin integrity, edema, etc.): NAD on RA; Performed incentive spirometry at end of session, able to pull 1200cc, and it did inspire a cough      Pertinent Vitals/Pain Pain Assessment Pain Assessment: 0-10 Pain Score: 2  Pain Location: LLE Pain Descriptors / Indicators: Grimacing, Guarding Pain Intervention(s): Monitored during session, Limited activity within patient's tolerance    Home Living                          Prior Function            PT Goals (current goals can now be found in the care plan  section) Acute Rehab PT Goals Patient Stated Goal: Get home, and have transportation to HD PT Goal Formulation: With patient Time For Goal Achievement: 04/09/24 Potential to Achieve Goals: Good Progress towards PT goals: Progressing toward goals    Frequency    Min 2X/week      PT Plan      Co-evaluation              AM-PAC PT 6 Clicks Mobility   Outcome Measure  Help needed turning from your back to your side while in a flat bed without using bedrails?: A Lot Help needed moving from lying on your back to sitting on the side of a flat bed without using bedrails?: A Lot Help needed moving to and from a bed to a chair (including a wheelchair)?: A Little Help needed standing up from a chair using your arms (e.g., wheelchair or bedside chair)?: A Little Help needed to walk in hospital room?: A Lot Help needed climbing 3-5 steps with a railing? : Total 6 Click Score: 13    End of Session Equipment Utilized During Treatment: Gait belt Activity Tolerance: Patient tolerated treatment well Patient left: in chair;with call bell/phone within reach;with chair alarm set;with family/visitor present Nurse Communication: Mobility status PT Visit Diagnosis: Other abnormalities of gait and mobility (R26.89);Pain Pain - Right/Left: Left Pain - part of body: Hip     Time: 9069-9047 PT Time Calculation (min) (ACUTE ONLY): 22 min  Charges:    $Gait Training: 8-22 mins PT General Charges $$ ACUTE PT VISIT: 1 Visit                     Silvano Currier, PT  Acute Rehabilitation Services Office [phone] Secure Chat welcomed    Silvano VEAR Currier 03/28/2024, 10:18 AM

## 2024-03-28 NOTE — Progress Notes (Signed)
 "                                                                         Orthopaedic Trauma Service Progress Note  Patient ID: Christopher [name] MRN: [id number] DOB/AGE: 1950/10/27 73 y.o.  Subjective:  No complaints  Minimal pain L hip   About to work with therapy   ROS As above  Today's  total administered Morphine Milligram Equivalents: 15 Yesterday's total administered Morphine Milligram Equivalents: 75  Objective:   VITALS:   Vitals:   03/27/24 2005 03/28/24 0018 03/28/24 0422 03/28/24 0719  BP: 130/81 123/74 138/76 126/81  Pulse: 73 72 66 64  Resp: 18 18 18    Temp: 98.6 F (37 C) 99.1 F (37.3 C) 98.9 F (37.2 C) 98.4 F (36.9 C)  TempSrc: Oral Oral Oral Oral  SpO2: 95% 93% 93% 93%  Weight:      Height:        Estimated body mass index is 27.38 kg/m (pended) as calculated from the following:   Height as of this encounter: (P) 5' 9 (1.753 m).   Weight as of this encounter: 84.1 kg.   Intake/Output      01/06 0701 01/07 0700 01/07 0701 01/08 0700   P.O. 600    I.V. (mL/kg)     IV Piggyback 50    Total Intake(mL/kg) 650 (7.7)    Urine (mL/kg/hr) 120 (0.1)    Other 1700    Total Output 1820    Net -1170           LABS  Results for orders placed or performed during the hospital encounter of 03/25/24 (from the past 24 hours)  Basic metabolic panel     Status: Abnormal   Collection Time: 03/28/24  5:03 AM  Result Value Ref Range   Sodium 135 135 - 145 mmol/L   Potassium 4.7 3.5 - 5.1 mmol/L   Chloride 95 (L) 98 - 111 mmol/L   CO2 27 22 - 32 mmol/L   Glucose, Bld 126 (H) 70 - 99 mg/dL   BUN 56 (H) 8 - 23 mg/dL   Creatinine, Ser 2.98 (H) 0.61 - 1.24 mg/dL   Calcium 89.9 8.9 - 89.6 mg/dL   GFR, Estimated 8 (L) >60 mL/min   Anion gap 13 5 - 15  CBC     Status: Abnormal   Collection Time: 03/28/24  5:03 AM  Result Value Ref Range   WBC 11.7 (H) 4.0 - 10.5 K/uL   RBC 4.40 4.22 - 5.81 MIL/uL   Hemoglobin 13.6 13.0 - 17.0 g/dL   HCT 59.7 60.9  - 47.9 %   MCV 91.4 80.0 - 100.0 fL   MCH 30.9 26.0 - 34.0 pg   MCHC 33.8 30.0 - 36.0 g/dL   RDW 84.8 88.4 - 84.4 %   Platelets 242 150 - 400 K/uL   nRBC 0.0 0.0 - 0.2 %     PHYSICAL EXAM:   Gen: resting comfortably in bed, NAD, pleasant, sitting up in bed  Lungs: unlabored  Ext:       Left Lower Extremity              Dressings left hip c/d/I  Ext warm  no pain with axial loading             Improved pain with log rolling              Distal motor and sensory functions intact at at baseline              No DCT   Assessment/Plan: 1 Day Post-Op   Principal Problem:   Closed left hip fracture, initial encounter [facility]) Active Problems:   Primary hypertension   Transitional cell carcinoma of bladder 2007   Depression   Controlled atrial fibrillation (HCC)   GERD (gastroesophageal reflux disease)   ESRD (end stage renal disease) (HCC)   Anti-infectives (From admission, onward)    Start     Dose/Rate Route Frequency Ordered Stop   03/27/24 0755  ceFAZolin  (ANCEF ) 2-4 GM/100ML-% IVPB       Note to Pharmacy: [name]: cabinet override      03/27/24 0755 03/27/24 2014     .  POD/HD#: 13  74 year old male with complex medical history s/p fall with left greater trochanter fracture and nondisplaced left intertrochanteric hip fracture  -Fall  -Nondisplaced left intertrochanteric hip fracture and left greater trochanter fracture s/p IMN  WBAT L leg   Still follow trochanteric hip precautions   Therapy evals   Ice and elevate   Dressing changes as needed starting tomorrow   PT/OT  May need SNF   - Pain management:  Multimodal   - ABL anemia/Hemodynamics  Stable   Monitor   - Medical issues   Per primary and renal   - DVT/PE prophylaxis:  Ok to resume eliquis  from ortho standpoint  - ID:   Periop abx  - Metabolic Bone Disease:  Due to ESRD  - Impediments to fracture healing:  ESRD    - Dispo:  Ortho issues stable  Therapies    Follow up with ortho in 2 weeks      Francis MICAEL Mt, PA-C [phone] (C) 03/28/2024, 9:43 AM  [facility] [address] [place] [address] [phone] (F)    After 5pm and on the weekends please log on to Amion, go to orthopaedics and the look under the [facility] Call for the provider(s) on call. You can also call our office at [phone] and then follow the prompts to be connected to the call team.  Patient ID: Christopher [name], male   DOB: 1951/01/22, 74 y.o.   MRN: [id number]  "

## 2024-03-28 NOTE — Progress Notes (Signed)
 Occupational Therapy Treatment Patient Details Name: Christopher [name] MRN: [id number] DOB: 1950/07/09 Today's Date: 03/28/2024   History of present illness 74 yo M adm 1/4 s/p fall with L greater trochanter fx, likely non-op management, WBAT, no active abduction; Ortho ordered MRI to r/o further fxs and found L intertrochate4ric fx, s/p Surgical fixation, still WBAT with no active abduction; PMH ESRD with HD, Afib on Eliquis , bladder carcinoma, depression anxiety, HTN   OT comments  Pt now s/p surgical repair of intertrochanteric fx 03/27/14. Pt demonstrating good progress towards functional goals. Focus of session on progressing functional mobility for OOB ADL tasks. Pt with improved bed mobility this date, continues to require up to Min physical assistance and dense verbal cues for sequencing steps for functional transfers. Requires up to Max A for ADL tasks. OT to continue to follow Pt acutely to facilitate continued progress towards goals. Current d/c recommendation remains appropriate.       If plan is discharge home, recommend the following:  Assistance with cooking/housework;Assist for transportation;Help with stairs or ramp for entrance;A lot of help with bathing/dressing/bathroom;A little help with walking and/or transfers   Equipment Recommendations  Other (comment) (defer)    Recommendations for Other Services      Precautions / Restrictions Precautions Precautions: Fall;Other (comment) Precaution/Restrictions Comments: WBAT, no active abduction Restrictions Weight Bearing Restrictions Per Provider Order: Yes LLE Weight Bearing Per Provider Order: Weight bearing as tolerated Other Position/Activity Restrictions: No active abduction       Mobility Bed Mobility Overal bed mobility: Needs Assistance Bed Mobility: Supine to Sit     Supine to sit: Contact guard, HOB elevated     General bed mobility comments: Imporved bed mobility this date, Pt required CGA for safety to  come to EOB on R side. Pt with good adherence to precautions during bed mobility.    Transfers Overall transfer level: Needs assistance Equipment used: Rolling walker (2 wheels) Transfers: Sit to/from Stand, Bed to chair/wheelchair/BSC Sit to Stand: Min assist     Step pivot transfers: Min assist     General transfer comment: Min physical assistance for step pivot transfer to recliner. Pt requires dense mulitmodal cues to sequence steps, mamange RW, and adhere to no abduction precaution. Pt with small BOS noted during transfer, cues to widen R stance.     Balance Overall balance assessment: Needs assistance Sitting-balance support: No upper extremity supported, Feet supported Sitting balance-Leahy Scale: Fair     Standing balance support: Bilateral upper extremity supported, During functional activity, Reliant on assistive device for balance Standing balance-Leahy Scale: Poor Standing balance comment: dependent on RW and external support                           ADL either performed or assessed with clinical judgement   ADL Overall ADL's : Needs assistance/impaired                 Upper Body Dressing : Set up;Sitting   Lower Body Dressing: Maximal assistance;Bed level Lower Body Dressing Details (indicate cue type and reason): Max A don socks                    Extremity/Trunk Assessment Upper Extremity Assessment Upper Extremity Assessment: Overall WFL for tasks assessed            Vision       Perception     Praxis     Communication Communication Communication: No  apparent difficulties   Cognition Arousal: Alert Behavior During Therapy: WFL for tasks assessed/performed Cognition: No apparent impairments             OT - Cognition Comments: Difficulty recalling precautions, appears to be at baseline                 Following commands: Impaired Following commands impaired: Follows one step commands with increased  time, Follows multi-step commands inconsistently      Cueing   Cueing Techniques: Verbal cues, Tactile cues, Visual cues  Exercises      Shoulder Instructions       General Comments VSS on RA.    Pertinent Vitals/ Pain       Pain Assessment Pain Assessment: 0-10 Pain Score: 3  Pain Location: LLE Pain Descriptors / Indicators: Discomfort, Guarding, Operative site guarding Pain Intervention(s): Limited activity within patient's tolerance, Monitored during session, Premedicated before session, Repositioned  Home Living                                          Prior Functioning/Environment              Frequency  Min 2X/week        Progress Toward Goals  OT Goals(current goals can now be found in the care plan section)  Progress towards OT goals: Progressing toward goals  Acute Rehab OT Goals Patient Stated Goal: to get better OT Goal Formulation: With patient Time For Goal Achievement: 04/09/24 Potential to Achieve Goals: Good ADL Goals Pt Will Perform Lower Body Bathing: with modified independence;with adaptive equipment Pt Will Perform Lower Body Dressing: with min assist;with adaptive equipment;sitting/lateral leans Pt Will Transfer to Toilet: with supervision;bedside commode Additional ADL Goal #1: Pt will engage in bed mobility with supervision as a precursor to ADL tasks OOB.  Plan      Co-evaluation                 AM-PAC OT 6 Clicks Daily Activity     Outcome Measure   Help from another person eating meals?: A Little Help from another person taking care of personal grooming?: A Little Help from another person toileting, which includes using toliet, bedpan, or urinal?: A Lot Help from another person bathing (including washing, rinsing, drying)?: A Lot Help from another person to put on and taking off regular upper body clothing?: A Little Help from another person to put on and taking off regular lower body clothing?: A  Lot 6 Click Score: 15    End of Session Equipment Utilized During Treatment: Gait belt;Rolling walker (2 wheels)  OT Visit Diagnosis: Unsteadiness on feet (R26.81);History of falling (Z91.81);Muscle weakness (generalized) (M62.81);Repeated falls (R29.6);Pain Pain - Right/Left: Left Pain - part of body: Hip   Activity Tolerance Patient tolerated treatment well   Patient Left in chair;with call bell/phone within reach;with chair alarm set   Nurse Communication Other (comment) (Pt care handed off to PT)        Time: 9086-9069 OT Time Calculation (min): 17 min  Charges: OT General Charges $OT Visit: 1 Visit OT Treatments $Therapeutic Activity: 8-22 mins  Maurilio CROME, OTR/L.  [facility]  Office: [phone]   Maurilio PARAS Clema Skousen 03/28/2024, 10:49 AM

## 2024-03-28 NOTE — Care Management Important Message (Signed)
 Important Message  Patient Details  Name: Christopher [name] MRN: [id number] Date of Birth: 1950-08-17   Important Message Given:  Yes - Medicare IM     Jennie Laneta Dragon 03/28/2024, 2:51 PM

## 2024-03-28 NOTE — Progress Notes (Signed)
 [facility] Progress Note  Subjective:  Seen in room, no c/o's SNF placement pending   Vitals:   03/28/24 0018 03/28/24 0422 03/28/24 0719 03/28/24 1335  BP: 123/74 138/76 126/81 (!) 143/79  Pulse: 72 66 64 (!) 59  Resp: 18 18    Temp: 99.1 F (37.3 C) 98.9 F (37.2 C) 98.4 F (36.9 C) 97.7 F (36.5 C)  TempSrc: Oral Oral Oral Oral  SpO2: 93% 93% 93% 97%  Weight:      Height:        Exam: Gen alert, no distress Sclera anicteric, throat clear  No jvd or bruits Chest clear bilat to bases RRR no MRG Abd soft ntnd no mass or ascites +bs Ext no LE or UE edema, no other edema Neuro is alert, Ox 3 , nf    RUA AVF+bruit    Home bp meds: No bp lowering meds    [facility]: [facility] 4h  b400  79kg  2K bath  AVF  Hep 2000 Last HD 1/03, post wt 79.6kg Gets close to dry weight on most/almost all treatments   Assessment/ Plan:   # ESRD - on HD TTS, last HD Saturday - had HD here Tuesday - next HD tomorrow     # BP - Is not on BP lowering meds at home - follow blood pressure     # Volume - No volume excess on exam - BP's dropped w/ 2 L goal yest, 1.7 removed - UF 2 L as tol w/ next HD     # 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   No results for input(s): IRON, TIBC, FERRITIN in the last 168 hours. Inpatient medications:  acetaminophen   1,000 mg Oral Q8H   amiodarone   200 mg Oral Daily   apixaban   5 mg Oral BID   Chlorhexidine  Gluconate Cloth  6 each Topical Q0600   cinacalcet   30 mg Oral Q M,W,F-HD   pantoprazole   40 mg Oral Daily   PARoxetine   40 mg Oral Daily    pneumococcal 20-valent conjugate vaccine  0.5 mL Intramuscular Tomorrow-1000   senna-docusate  1 tablet Oral QHS   sodium chloride  flush  3 mL Intravenous Q12H   sodium chloride  flush  3 mL Intravenous Q12H   sucroferric oxyhydroxide  500 mg Oral TID WC   tamsulosin   0.4 mg Oral Daily     acetaminophen  **OR** acetaminophen , bisacodyl , hydrALAZINE , HYDROmorphone  (DILAUDID ) injection, ipratropium, ondansetron  **OR** ondansetron  (ZOFRAN ) IV, oxyCODONE , traZODone 

## 2024-03-28 NOTE — NC FL2 (Signed)
 " [place]  MEDICAID [id number] LEVEL OF CARE FORM     IDENTIFICATION  Patient Name: Christopher [name] Birthdate: 1950/09/13 Sex: male Admission Date (Current Location): 03/25/2024  [facility] and [place] Number:  Producer, Television/film/video and Address:  [facility] [place], [address], [place], [place] [address]      Provider Number: [id number]  Attending Physician Name and Address:  Sonjia Held, MD  Relative Name and Phone Number:  ETHIN, DRUMMOND [phone]  [phone]    Current Level of Care: Hospital Recommended Level of Care: Skilled Nursing Facility Prior Approval Number:    Date Approved/Denied:   PASRR Number:    Discharge Plan: SNF    Current Diagnoses: Patient Active Problem List   Diagnosis Date Noted   Closed left hip fracture, initial encounter (HCC) 03/25/2024   GERD (gastroesophageal reflux disease) 03/25/2024   ESRD (end stage renal disease) (HCC) 03/25/2024   Hypercoagulable state due to persistent atrial fibrillation (HCC) 05/13/2023   Thrombophilia 07/28/2021   Current use of long term anticoagulation 07/28/2021   Controlled atrial fibrillation (HCC) 07/07/2021   Chronic kidney disease on chronic dialysis (HCC) 04/16/2019   Depression 08/26/2016   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   Primary hypertension 11/21/2011    Orientation RESPIRATION BLADDER Height & Weight     Self, Time, Situation, Place  Normal Continent Weight: 185 lb 6.5 oz (84.1 kg) Height:  (P) 5' 9 (175.3 cm)  BEHAVIORAL SYMPTOMS/MOOD NEUROLOGICAL BOWEL NUTRITION STATUS      Continent Diet (see discharge summary)  AMBULATORY STATUS COMMUNICATION OF NEEDS Skin   Limited Assist Verbally Surgical wounds, Other (Comment) (ecchymosis)                       Personal Care Assistance Level of Assistance  Bathing, Feeding, Dressing Bathing Assistance: Maximum assistance Feeding assistance: Limited assistance Dressing Assistance: Maximum  assistance     Functional Limitations Info  Sight, Hearing, Speech Sight Info: Adequate Hearing Info: Adequate Speech Info: Adequate    SPECIAL CARE FACTORS FREQUENCY  PT (By licensed PT), OT (By licensed OT)     PT Frequency: 5x week OT Frequency: 5x week            Contractures Contractures Info: Not present    Additional Factors Info  Code Status, Allergies Code Status Info: full Allergies Info: NKA           Current Medications (03/28/2024):  This is the current hospital active medication list Current Facility-Administered Medications  Medication Dose Route Frequency Provider Last Rate Last Admin   acetaminophen  (TYLENOL ) tablet 650 mg  650 mg Oral Q6H PRN Deward Eck, PA-C   650 mg at 03/25/24 2151   Or   acetaminophen  (TYLENOL ) suppository 650 mg  650 mg Rectal Q6H PRN Deward Eck, PA-C       acetaminophen  (TYLENOL ) tablet 1,000 mg  1,000 mg Oral Q8H Deward Eck, PA-C   1,000 mg at 03/28/24 1448   amiodarone  (PACERONE ) tablet 200 mg  200 mg Oral Daily Deward Eck, PA-C   200 mg at 03/28/24 9162   apixaban  (ELIQUIS ) tablet 5 mg  5 mg Oral BID Pokhrel, Laxman, MD   5 mg at 03/28/24 1118   bisacodyl  (DULCOLAX) EC tablet 5 mg  5 mg Oral Daily PRN Deward Eck, PA-C       Chlorhexidine  Gluconate Cloth 2 % PADS 6 each  6 each Topical Q0600 Deward Eck, PA-C   6  each at 03/28/24 0616   cinacalcet  (SENSIPAR ) tablet 30 mg  30 mg Oral Q M,W,F-HD Deward Eck, PA-C   30 mg at 03/28/24 1117   hydrALAZINE  (APRESOLINE ) injection 10 mg  10 mg Intravenous Q4H PRN Deward Eck, PA-C       HYDROmorphone  (DILAUDID ) injection 1 mg  1 mg Intravenous Q2H PRN Deward Eck, PA-C   1 mg at 03/26/24 0631   ipratropium (ATROVENT ) nebulizer solution 0.5 mg  0.5 mg Nebulization Q6H PRN Deward Eck, PA-C       ondansetron  (ZOFRAN ) tablet 4 mg  4 mg Oral Q6H PRN Deward Eck, PA-C       Or   ondansetron  (ZOFRAN ) injection 4 mg  4 mg Intravenous Q6H PRN Deward Eck, PA-C       oxyCODONE  (Oxy  IR/ROXICODONE ) immediate release tablet 5 mg  5 mg Oral Q4H PRN Deward Eck, PA-C   5 mg at 03/28/24 9161   pantoprazole  (PROTONIX ) EC tablet 40 mg  40 mg Oral Daily Deward Eck, PA-C   40 mg at 03/28/24 9162   PARoxetine  (PAXIL ) tablet 40 mg  40 mg Oral Daily Deward Eck, PA-C   40 mg at 03/28/24 9162   pneumococcal 20-valent conjugate vaccine (PREVNAR 20 ) injection 0.5 mL  0.5 mL Intramuscular Tomorrow-1000 Deward Eck, PA-C       senna-docusate (Senokot-S) tablet 1 tablet  1 tablet Oral QHS Deward Eck, PA-C   1 tablet at 03/27/24 2017   sodium chloride  flush (NS) 0.9 % injection 3 mL  3 mL Intravenous Q12H Deward Eck, PA-C   3 mL at 03/28/24 0840   sodium chloride  flush (NS) 0.9 % injection 3 mL  3 mL Intravenous Q12H Deward Eck, PA-C   3 mL at 03/28/24 0840   sucroferric oxyhydroxide (VELPHORO ) chewable tablet 500 mg  500 mg Oral TID WC Deward Eck, PA-C   500 mg at 03/28/24 1117   tamsulosin  (FLOMAX ) capsule 0.4 mg  0.4 mg Oral Daily Deward Eck, PA-C   0.4 mg at 03/28/24 9161   traZODone  (DESYREL ) tablet 25 mg  25 mg Oral QHS PRN Deward Eck, PA-C   25 mg at 03/26/24 2143     Discharge Medications: Please see discharge summary for a list of discharge medications.  Relevant Imaging Results:  Relevant Lab Results:   Additional Information SSN: [id number].  HD pt:[facility] on TTS 5:00 am chair time.  Bridget Cordella Simmonds, LCSW     "

## 2024-03-28 NOTE — TOC Initial Note (Signed)
 Transition of Care [facility]) - Initial/Assessment Note    Patient Details  Name: Christopher [name] MRN: [id number] Date of Birth: 1950/09/18  [facility]) CM/SW Contact:    Bridget Cordella Simmonds, LCSW Phone Number: 03/28/2024, 3:17 PM  Clinical Narrative:    CSW met with pt regarding PT recommendation for SNF.  Pt from home with wife [name], no current services.  Permission given to speak with wife and with son Adina.  Pt is agreeable to SNF, permission given to send out referral in the hub.  HD pt, [facility].   Referral sent out in hub for SNF.                Expected Discharge Plan: Skilled Nursing Facility Barriers to Discharge: Continued Medical Work up, SNF Pending bed offer   Patient Goals and CMS Choice Patient states their goals for this hospitalization and ongoing recovery are:: back to normal          Expected Discharge Plan and Services In-house Referral: Clinical Social Work   Post Acute Care Choice: Skilled Nursing Facility Living arrangements for the past 2 months: Single Family Home                                      Prior Living Arrangements/Services Living arrangements for the past 2 months: Single Family Home Lives with:: Spouse Patient language and need for interpreter reviewed:: Yes        Need for Family Participation in Patient Care: No (Comment) Care giver support system in place?: Yes (comment) Current home services: Other (comment) (none) Criminal Activity/Legal Involvement Pertinent to Current Situation/Hospitalization: No - Comment as needed  Activities of Daily Living   ADL Screening (condition at time of admission) Independently performs ADLs?: Yes (appropriate for developmental age) Is the patient deaf or have difficulty hearing?: Yes Does the patient have difficulty seeing, even when wearing glasses/contacts?: No Does the patient have difficulty concentrating, remembering, or making decisions?: No  Permission  Sought/Granted                  Emotional Assessment Appearance:: Appears stated age Attitude/Demeanor/Rapport: Engaged Affect (typically observed): Appropriate, Pleasant Orientation: : Oriented to Self, Oriented to Place, Oriented to  Time, Oriented to Situation      Admission diagnosis:  Closed fracture of left hip, initial encounter (HCC) [S72.002A] Fall, initial encounter [W19.XXXA] Closed left hip fracture, initial encounter [facility]) [S72.002A] Patient Active Problem List   Diagnosis Date Noted   Closed left hip fracture, initial encounter (HCC) 03/25/2024   GERD (gastroesophageal reflux disease) 03/25/2024   ESRD (end stage renal disease) (HCC) 03/25/2024   Hypercoagulable state due to persistent atrial fibrillation (HCC) 05/13/2023   Thrombophilia 07/28/2021   Current use of long term anticoagulation 07/28/2021   Controlled atrial fibrillation (HCC) 07/07/2021   Chronic kidney disease on chronic dialysis (HCC) 04/16/2019   Depression 08/26/2016   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 [place] [address] Phone: [phone] Fax: [phone]     Social Drivers of Health (SDOH) Social History: SDOH Screenings   Food Insecurity: No Food Insecurity (03/25/2024)  Housing: Low Risk (03/25/2024)  Transportation Needs: No Transportation Needs (03/25/2024)  Utilities: Not At Risk (03/25/2024)  Alcohol Screen: Low Risk (12/14/2021)  Depression (PHQ2-9): Low Risk (12/14/2021)  Financial Resource  Strain: Low Risk (12/14/2021)  Physical Activity: Inactive (12/14/2021)  Social Connections: Moderately Isolated (03/25/2024)  Stress: No Stress Concern Present (12/14/2021)  Tobacco Use: Medium Risk (03/27/2024)   SDOH Interventions:     Readmission Risk Interventions     No data to display

## 2024-03-29 DIAGNOSIS — S72002A Fracture of unspecified part of neck of left femur, initial encounter for closed fracture: Secondary | ICD-10-CM | POA: Diagnosis not present

## 2024-03-29 LAB — CBC
HCT: 39.9 % (ref 39.0–52.0)
Hemoglobin: 13.3 g/dL (ref 13.0–17.0)
MCH: 30.5 pg (ref 26.0–34.0)
MCHC: 33.3 g/dL (ref 30.0–36.0)
MCV: 91.5 fL (ref 80.0–100.0)
Platelets: 236 K/uL (ref 150–400)
RBC: 4.36 MIL/uL (ref 4.22–5.81)
RDW: 15.2 % (ref 11.5–15.5)
WBC: 7.9 K/uL (ref 4.0–10.5)
nRBC: 0 % (ref 0.0–0.2)

## 2024-03-29 LAB — BASIC METABOLIC PANEL WITH GFR
Anion gap: 14 (ref 5–15)
BUN: 78 mg/dL — ABNORMAL HIGH (ref 8–23)
CO2: 27 mmol/L (ref 22–32)
Calcium: 10.2 mg/dL (ref 8.9–10.3)
Chloride: 94 mmol/L — ABNORMAL LOW (ref 98–111)
Creatinine, Ser: 8.58 mg/dL — ABNORMAL HIGH (ref 0.61–1.24)
GFR, Estimated: 6 mL/min — ABNORMAL LOW
Glucose, Bld: 93 mg/dL (ref 70–99)
Potassium: 4.6 mmol/L (ref 3.5–5.1)
Sodium: 135 mmol/L (ref 135–145)

## 2024-03-29 LAB — VITAMIN D 25 HYDROXY (VIT D DEFICIENCY, FRACTURES): Vit D, 25-Hydroxy: 56.3 ng/mL (ref 30–100)

## 2024-03-29 MED ORDER — OXYCODONE HCL 5 MG PO TABS: 5.0000 mg | ORAL_TABLET | Freq: Four times a day (QID) | ORAL | 0 refills | Status: DC | PRN

## 2024-03-29 NOTE — TOC Progression Note (Addendum)
 Transition of Care [facility]) - Progression Note    Patient Details  Name: Christopher [name] MRN: [id number] Date of Birth: 19-Jul-1950  Transition of Care [facility]) CM/SW Contact  Bridget Cordella Simmonds, LCSW Phone Number: 03/29/2024, 11:46 AM  Clinical Narrative:   Bed offer at [facility], several potential bed offers elsewhere discussed with pt.  He asked CSW to reach out to his wife.  CSW spoke with wife [name] by phone, she is unsure of distance from her home to [facility] or [facility], asked CSW to speak with her son [name].  1030: CSW spoke with son [name] regarding the above.  He reports would be similar distance to [facility] or [facility].  He will speak with his mother.  Message sent to [name]/[facility]: they do not have available HD transport currently, cannot offer.  CSW received call from [name], they do want to accept offer at [facility].  Waiting on confirmation from [facility] resources. [name] currently out.  [name] covering admissions.   1345: CSW spoke with [name]/[facility].  They can accept pt but cannot do HD in [employer].  Can do [facility] or [facility] in [place], can do MWF or TTS, any chair time including 5am.  CSW reached out to renal navigator to check if can switch HD site.   Expected Discharge Plan: Skilled Nursing Facility Barriers to Discharge: Continued Medical Work up, SNF Pending bed offer               Expected Discharge Plan and Services In-house Referral: Clinical Social Work   Post Acute Care Choice: Skilled Nursing Facility Living arrangements for the past 2 months: Single Family Home                                       Social Drivers of Health (SDOH) Interventions SDOH Screenings   Food Insecurity: No Food Insecurity (03/25/2024)  Housing: Low Risk (03/25/2024)  Transportation Needs: No Transportation Needs (03/25/2024)  Utilities: Not At Risk (03/25/2024)  Alcohol Screen: Low Risk (12/14/2021)  Depression (PHQ2-9): Low Risk (12/14/2021)  Financial  Resource Strain: Low Risk (12/14/2021)  Physical Activity: Inactive (12/14/2021)  Social Connections: Moderately Isolated (03/25/2024)  Stress: No Stress Concern Present (12/14/2021)  Tobacco Use: Medium Risk (03/27/2024)    Readmission Risk Interventions     No data to display

## 2024-03-29 NOTE — Discharge Instructions (Signed)
 "  Orthopaedic Trauma Service Discharge Instructions   General Discharge Instructions  Orthopaedic Injuries:  Left hip fracture treated with intramedullary nailing   WEIGHT BEARING STATUS: weightbearing as tolerated left leg with assistance (walker)  RANGE OF MOTION/ACTIVITY: unrestricted motion of left hip and knee. Increase activity as tolerated   Bone health:    Review the following resource for additional information regarding bone health  [email or web address].cy  Wound Care:  Daily wound care starting now  Discharge Wound Care Instructions  Do NOT apply any ointments, solutions or lotions to pin sites or surgical wounds.  These prevent needed drainage and even though solutions like hydrogen peroxide kill bacteria, they also damage cells lining the pin sites that help fight infection.  Applying lotions or ointments can keep the wounds moist and can cause them to breakdown and open up as well. This can increase the risk for infection. When in doubt call the office.  Surgical incisions should be dressed daily.  If any drainage is noted, use one layer of adaptic or Mepitel, then gauze and tape. Alternatively you can use a silicone foam dressing such as a mepilex (this is what you have had on in the hospital)  [email or web address][employer]cz [email or web address]_[email or web address]&[email or web address]  [email or web address].com/  These dressing supplies should be available at local medical supply stores ([facility] [employer], [facility], etc). They are not usually carried at places like [facility], [facility], [facility], etc  Once the incision is completely dry and without drainage, it may be left open to air out.  Showering may begin 36-48 hours later.  Cleaning gently with soap and water.   Diet: as you were  eating previously.  Can use over the counter stool softeners and bowel preparations, such as Miralax, to help with bowel movements.  Narcotics can be constipating.  Be sure to drink plenty of fluids  PAIN MEDICATION USE AND EXPECTATIONS  You have likely been given narcotic medications to help control your pain.  After a traumatic event that results in an fracture (broken bone) with or without surgery, it is ok to use narcotic pain medications to help control one's pain.  We understand that everyone responds to pain differently and each individual patient will be evaluated on a regular basis for the continued need for narcotic medications. Ideally, narcotic medication use should last no more than 6-8 weeks (coinciding with fracture healing).   As a patient it is your responsibility as well to monitor narcotic medication use and report the amount and frequency you use these medications when you come to your office visit.   We would also advise that if you are using narcotic medications, you should take a dose prior to therapy to maximize you participation.  IF YOU ARE ON NARCOTIC MEDICATIONS IT IS NOT PERMISSIBLE TO OPERATE A MOTOR VEHICLE (MOTORCYCLE/CAR/TRUCK/MOPED) OR HEAVY MACHINERY DO NOT MIX NARCOTICS WITH OTHER CNS (CENTRAL NERVOUS SYSTEM) DEPRESSANTS SUCH AS ALCOHOL   POST-OPERATIVE OPIOID TAPER INSTRUCTIONS: It is important to wean off of your opioid medication as soon as possible. If you do not need pain medication after your surgery it is ok to stop day one. Opioids include: Codeine, Hydrocodone(Norco, Vicodin), Oxycodone (Percocet, oxycontin ) and hydromorphone  amongst others.  Long term and even short term use of opiods can cause: Increased pain response Dependence Constipation Depression Respiratory depression And more.  Withdrawal symptoms can include Flu like symptoms Nausea, vomiting And more Techniques to manage these symptoms Hydrate well Eat regular healthy meals Stay  active Use relaxation techniques(deep breathing, meditating, yoga) Do Not substitute Alcohol  to help with tapering If you have been on opioids for less than two weeks and do not have pain than it is ok to stop all together.  Plan to wean off of opioids This plan should start within one week post op of your fracture surgery  Maintain the same interval or time between taking each dose and first decrease the dose.  Cut the total daily intake of opioids by one tablet each day Next start to increase the time between doses. The last dose that should be eliminated is the evening dose.    STOP SMOKING OR USING NICOTINE PRODUCTS!!!!  As discussed nicotine severely impairs your body's ability to heal surgical and traumatic wounds but also impairs bone healing.  Wounds and bone heal by forming microscopic blood vessels (angiogenesis) and nicotine is a vasoconstrictor (essentially, shrinks blood vessels).  Therefore, if vasoconstriction occurs to these microscopic blood vessels they essentially disappear and are unable to deliver necessary nutrients to the healing tissue.  This is one modifiable factor that you can do to dramatically increase your chances of healing your injury.    (This means no smoking, no nicotine gum, patches, etc)  DO NOT USE NONSTEROIDAL ANTI-INFLAMMATORY DRUGS (NSAID'S)  Using products such as Advil (ibuprofen), Aleve (naproxen), Motrin (ibuprofen) for additional pain control during fracture healing can delay and/or prevent the healing response.  If you would like to take over the counter (OTC) medication, Tylenol  (acetaminophen ) is ok.  However, some narcotic medications that are given for pain control contain acetaminophen  as well. Therefore, you should not exceed more than 4000 mg of tylenol  in a day if you do not have liver disease.  Also note that there are may OTC medicines, such as cold medicines and allergy medicines that my contain tylenol  as well.  If you have any questions  about medications and/or interactions please ask your doctor/PA or your pharmacist.      ICE AND ELEVATE INJURED/OPERATIVE EXTREMITY  Using ice and elevating the injured extremity above your heart can help with swelling and pain control.  Icing in a pulsatile fashion, such as 20 minutes on and 20 minutes off, can be followed.    Do not place ice directly on skin. Make sure there is a barrier between to skin and the ice pack.    Using frozen items such as frozen peas works well as the conform nicely to the are that needs to be iced.  USE AN ACE WRAP OR TED HOSE FOR SWELLING CONTROL  In addition to icing and elevation, Ace wraps or TED hose are used to help limit and resolve swelling.  It is recommended to use Ace wraps or TED hose until you are informed to stop.    When using Ace Wraps start the wrapping distally (farthest away from the body) and wrap proximally (closer to the body)   Example: If you had surgery on your leg and you do not have a splint on, start the ace wrap at the toes and work your way up to the thigh        If you had surgery on your upper extremity and do not have a splint on, start the ace wrap at your fingers and work your way up to the upper arm  IF YOU ARE IN A SPLINT OR CAST DO NOT REMOVE IT FOR ANY REASON   If your splint gets wet for any reason please contact the office immediately. You may shower in your splint or cast as  long as you keep it dry.  This can be done by wrapping in a cast cover or garbage back (or similar)  Do Not stick any thing down your splint or cast such as pencils, money, or hangers to try and scratch yourself with.  If you feel itchy take benadryl as prescribed on the bottle for itching  IF YOU ARE IN A CAM BOOT (BLACK BOOT)  You may remove boot periodically. Perform daily dressing changes as noted below.  Wash the liner of the boot regularly and wear a sock when wearing the boot. It is recommended that you sleep in the boot until told  otherwise    Call office for the following: Temperature greater than 101F Persistent nausea and vomiting Severe uncontrolled pain Redness, tenderness, or signs of infection (pain, swelling, redness, odor or green/yellow discharge around the site) Difficulty breathing, headache or visual disturbances Hives Persistent dizziness or light-headedness Extreme fatigue Any other questions or concerns you may have after discharge  In an emergency, call 911 or go to an Emergency Department at a nearby hospital  HELPFUL INFORMATION  If you had a block, it will wear off between 8-24 hrs postop typically.  This is period when your pain may go from nearly zero to the pain you would have had postop without the block.  This is an abrupt transition but nothing dangerous is happening.  You may take an extra dose of narcotic when this happens.  You should wean off your narcotic medicines as soon as you are able.  Most patients will be off or using minimal narcotics before their first postop appointment.   We suggest you use the pain medication the first night prior to going to bed, in order to ease any pain when the anesthesia wears off. You should avoid taking pain medications on an empty stomach as it will make you nauseous.  Do not drink alcoholic beverages or take illicit drugs when taking pain medications.  In most states it is against the law to drive while you are in a splint or sling.  And certainly against the law to drive while taking narcotics.  You may return to work/school in the next couple of days when you feel up to it.   Pain medication may make you constipated.  Below are a few solutions to try in this order: Decrease the amount of pain medication if you arent having pain. Drink lots of decaffeinated fluids. Drink prune juice and/or each dried prunes  If the first 3 dont work start with additional solutions Take Colace - an over-the-counter stool softener Take Senokot - an  over-the-counter laxative Take Miralax - a stronger over-the-counter laxative     CALL THE OFFICE WITH ANY QUESTIONS OR CONCERNS: [phone]   VISIT OUR WEBSITE FOR ADDITIONAL INFORMATION: [email or web address]      Discharge Pin Site Instructions  Dress pins daily with Kerlix roll starting on POD 2. Wrap the Kerlix so that it tamps the skin down around the pin-skin interface to prevent/limit motion of the skin relative to the pin.  (Pin-skin motion is the primary cause of pain and infection related to external fixator pin sites).  Remove any crust or coagulum that may obstruct drainage with soap and water.  After POD 3, if there is no discernable drainage on the pin site dressing, the interval for change can by increased to every other day.  You may shower with the fixator, cleaning all pin sites gently with soap and water.  If you  have a surgical wound this needs to be completely dry and without drainage before showering. Alternatively you can use a washcloth with soap and water and gently clean the injured extremity and external fixator, including all pinsites and surgical wounds   The extremity can be lifted by the fixator to facilitate wound care and transfers.  Notify the office/Doctor if you experience increasing drainage, redness, or pain from a pin site, or if you notice purulent (thick, snot-like) drainage. As we discussed pin tract infections are common in this is most likely as a result of mechanical irritation from the skin pin interface. Primary treatment is hygiene and cleaning with soap and water. If this does not resolve with regular cleaning contact the office    "

## 2024-03-29 NOTE — Progress Notes (Addendum)
 " PROGRESS NOTE  Christopher [name] FMW:[id number] DOB: 01/13/1951 DOA: 03/25/2024 PCP: Christopher Emil Schanz, MD   LOS: 4 days   Brief narrative:  Christopher [name] is a  74 y.o male with history of ESRD (TTS), A-fib (on Eliquis ), HTN, anxiety, depression, bladder cancer, presented to hospital with dizziness after dialysis and had a mechanical fall with left hip pain.  Did not lose consciousness.  In the ED patient was slightly bradycardic.  Labs were normal except for creatinine elevation at 6.0.  X-ray of the left hip showed minimally displaced  greater trochanteric fracture of proximal left femur.  CT abdomen and pelvis showed minimally displaced fracture of the greater trochanter on the left.  Orthopedics was consulted and patient was admitted to the hospital for further evaluation and treatment.   Assessment/Plan: Principal Problem:   Closed left hip fracture, initial encounter [facility]) Active Problems:   Primary hypertension   Depression   Controlled atrial fibrillation (HCC)   Transitional cell carcinoma of bladder 2007   GERD (gastroesophageal reflux disease)   ESRD (end stage renal disease) (HCC)   Closed left hip fracture, initial encounter Status post mechanical fall.  X-ray and CT scan with minimally displaced greater trochanter fracture.MRI of the hip was performed and patient underwent IM nailing of the left hip on 03/27/2024 by Dr. Celena,  Orthopedics.  PT OT has seen the patient  and recommend skilled nursing facility placement.  Mild hyperkalemia.  Adjusted during hemodialysis.  Latest potassium of 4.6.    persistent atrial fibrillation Rate well-controlled on amiodarone , continue Eliquis  for anticoagulation..  Review of 2D echocardiogram from April 2023 with LV ejection fraction of 60 to 65%.  Depression Continue paroxetine .   Primary hypertension Fluctuates with dialysis.  Continue to monitor during hospitalization.  Latest blood pressure 136/81.   Transitional cell  carcinoma of bladder 2007 In remission.   ESRD (end stage renal disease)  On hemodialysis-Tuesdays, Thursdays, Saturdays.  Nephrology on board for hemodialysis needs.    Bilateral foot tingling.  Might be neuropathy.  Supportive care.   DVT prophylaxis: SCDs Start: 03/25/24 1415 apixaban  (ELIQUIS ) tablet 5 mg Will restart Eliquis   Disposition: Skilled nursing facility as per PT evaluation.  Medically stable for disposition.  Status is: Inpatient Remains inpatient appropriate because: Need for rehabilitation.    Code Status:     Code Status: Full Code  Family Communication: Spoke with patient's family on 03/28/24  Consultants: Orthopedics  Procedures: IM nailing of the left hip on 03/27/2024 by Dr. Celena orthopedics  Anti-infectives:   Anti-infectives (From admission, onward)    Start     Dose/Rate Route Frequency Ordered Stop   03/27/24 0755  ceFAZolin  (ANCEF ) 2-4 GM/100ML-% IVPB       Note to Pharmacy: [name]: cabinet override      03/27/24 0755 03/27/24 2014        Subjective: Today, patient was seen and examined at bedside.  Patient denies interval complaints.  No nausea vomiting fever chills or rigor.  Has not had a bowel movement in couple of days.     Objective: Vitals:   03/29/24 0343 03/29/24 0754  BP: 137/81 136/81  Pulse: (!) 57 (!) 57  Resp: 18 16  Temp: 98 F (36.7 C) 97.9 F (36.6 C)  SpO2: 97% 93%    Intake/Output Summary (Last 24 hours) at 03/29/2024 0925 Last data filed at 03/28/2024 2349 Gross per 24 hour  Intake 180 ml  Output 160 ml  Net 20 ml  Filed Weights   03/27/24 0655 03/27/24 1409 03/27/24 1732  Weight: (P) 85.8 kg 85.8 kg 84.1 kg   Body mass index is 27.38 kg/m (pended).   Physical Exam:  General:  Average built, not in obvious distress, Communicative HENT:   No scleral pallor or icterus noted. Oral mucosa is moist.  Chest:  Clear breath sounds. No crackles or wheezes.  CVS: S1 &S2 heard. No murmur.  Regular  rate and rhythm. Abdomen: Soft, nontender, nondistended.  Bowel sounds are heard.   Extremities: No cyanosis, clubbing    Peripheral pulses are palpable.  Right upper extremity AV fistula.  Left hip surgical incision site with dressing. Psych: Alert, awake and oriented, normal mood CNS:  No cranial nerve deficits.  Moves extremities. Skin: Warm and dry.  No rashes noted.   Data Review: I have personally reviewed the following laboratory data and studies,  CBC: Recent Labs  Lab 03/25/24 1156 03/27/24 0607 03/28/24 0503 03/29/24 0704  WBC 10.4 7.5 11.7* 7.9  NEUTROABS 8.2*  --   --   --   HGB 15.6 14.7 13.6 13.3  HCT 47.5 44.8 40.2 39.9  MCV 92.2 94.1 91.4 91.5  PLT 264 225 242 236   Basic Metabolic Panel: Recent Labs  Lab 03/25/24 1156 03/26/24 0225 03/27/24 0607 03/28/24 0503 03/29/24 0704  NA 138 138 137 135 135  K 4.3 4.4 5.7* 4.7 4.6  CL 98 98 98 95* 94*  CO2 25 27 26 27 27   GLUCOSE 90 76 100* 126* 93  BUN 38* 51* 72* 56* 78*  CREATININE 6.05* 6.82* 8.24* 7.01* 8.58*  CALCIUM 10.2 10.1 10.2 10.0 10.2  MG 2.1  --   --   --   --   PHOS 2.9  --   --   --   --    Liver Function Tests: No results for input(s): AST, ALT, ALKPHOS, BILITOT, PROT, ALBUMIN  in the last 168 hours. No results for input(s): LIPASE, AMYLASE in the last 168 hours. No results for input(s): AMMONIA in the last 168 hours. Cardiac Enzymes: No results for input(s): CKTOTAL, CKMB, CKMBINDEX, TROPONINI in the last 168 hours. BNP (last 3 results) No results for input(s): BNP in the last 8760 hours.  ProBNP (last 3 results) No results for input(s): PROBNP in the last 8760 hours.  CBG: No results for input(s): GLUCAP in the last 168 hours. Recent Results (from the past 240 hours)  MRSA Next Gen by PCR, Nasal     Status: None   Collection Time: 03/25/24  3:36 AM   Specimen: Nasal Mucosa; Nasal Swab  Result Value Ref Range Status   MRSA by PCR Next Gen NOT DETECTED  NOT DETECTED Final    Comment: (NOTE) The GeneXpert MRSA Assay (FDA approved for NASAL specimens only), is one component of a comprehensive MRSA colonization surveillance program. It is not intended to diagnose MRSA infection nor to guide or monitor treatment for MRSA infections. Test performance is not FDA approved in patients less than 69 years old. Performed at [facility], [address] [place], [place] [address]   Expectorated Sputum Assessment w Gram Stain, Rflx to Resp Cult     Status: None   Collection Time: 03/26/24 11:56 PM   Specimen: Expectorated Sputum  Result Value Ref Range Status   Specimen Description EXPECTORATED SPUTUM  Final   Special Requests NONE  Final   Sputum evaluation   Final    THIS SPECIMEN IS ACCEPTABLE FOR SPUTUM CULTURE  Performed at [facility], [address] [place], [place] [address]    Report Status 03/27/2024 FINAL  Final  Culture, Respiratory w Gram Stain     Status: None (Preliminary result)   Collection Time: 03/26/24 11:56 PM  Result Value Ref Range Status   Specimen Description EXPECTORATED SPUTUM  Final   Special Requests NONE Reflexed from [id number]  Final   Gram Stain   Final    MODERATE WBC PRESENT, PREDOMINANTLY PMN MODERATE GRAM POSITIVE COCCI    Culture   Final    CULTURE REINCUBATED FOR BETTER GROWTH Performed at [facility], [address] [place], [place] [address]    Report Status PENDING  Incomplete     Studies: DG HIP UNILAT WITH PELVIS 2-3 VIEWS LEFT Result Date: 03/27/2024 CLINICAL DATA:  Operative fixation of a left intertrochanteric fracture. EXAM: DG HIP (WITH OR WITHOUT PELVIS) 2-3V*L* COMPARISON:  C-arm images obtained earlier today, left hip MR dated 03/26/2024 and left hip CT and radiographs dated 03/25/2024. FINDINGS: Intramedullary nail and screw fixation of the recently demonstrated left intertrochanteric fracture with anatomic position and alignment. The bones appear osteopenic. IMPRESSION:  Anatomic position and alignment of the recently demonstrated left intertrochanteric fracture following internal fixation. Electronically Signed   By: Elspeth Bathe M.D.   On: 03/27/2024 16:04   DG HIP UNILAT WITH PELVIS 2-3 VIEWS LEFT Result Date: 03/27/2024 CLINICAL DATA:  Hardware fixation of a left intertrochanteric fracture. EXAM: DG HIP (WITH OR WITHOUT PELVIS) 2-3V*L* COMPARISON:  Left hip radiographs and CT dated 03/25/2024 and left hip MRI dated 03/26/2024. FINDINGS: Four C-arm images of the left hip and proximal femur demonstrate nail and screw fixation of the recently demonstrated left intertrochanteric fracture. Anatomic position and alignment of the fracture fragments. IMPRESSION: Hardware fixation of the recently demonstrated left intertrochanteric fracture with anatomic position and alignment of the fracture fragments. Electronically Signed   By: Elspeth Bathe M.D.   On: 03/27/2024 16:03   DG C-Arm 1-60 Min-No Report Result Date: 03/27/2024 Fluoroscopy was utilized by the requesting physician.  No radiographic interpretation.   DG C-Arm 1-60 Min-No Report Result Date: 03/27/2024 Fluoroscopy was utilized by the requesting physician.  No radiographic interpretation.      Daley Gosse, MD  [facility] 03/29/2024  If 7PM-7AM, please contact night-coverage             "

## 2024-03-29 NOTE — Progress Notes (Signed)
 [facility] Progress Note  Subjective:  Seen in room, no c/o's SNF placement pending   Vitals:   03/28/24 1955 03/29/24 0343 03/29/24 0754 03/29/24 1513  BP: 122/69 137/81 136/81 129/73  Pulse: (!) 55 (!) 57 (!) 57 (!) 56  Resp: 18 18 16 16   Temp: 97.8 F (36.6 C) 98 F (36.7 C) 97.9 F (36.6 C) 97.9 F (36.6 C)  TempSrc: Oral Oral    SpO2: 94% 97% 93% 94%  Weight:      Height:        Exam: Gen alert, no distress Sclera anicteric, throat clear  No jvd or bruits Chest clear bilat to bases RRR no MRG Abd soft ntnd no mass or ascites +bs Ext no LE or UE edema, no other edema Neuro is alert, Ox 3 , nf    RUA AVF+bruit    Home bp meds: No bp lowering meds    [facility] 4[facility]  b400  79kg  2K bath  AVF  Hep 2000 Last HD 1/03, post wt 79.6kg Gets close to dry weight on most/almost all treatments   Assessment/ Plan:   # ESRD - on HD TTS, last HD Saturday - had HD here Tuesday - next HD today     # BP - Is not on BP lowering meds at home - follow blood pressure     # Volume - No volume excess on exam - BP's dropped w/ 2 L goal yest, 1.7 removed - UF 2 L as tol w/ next HD     # 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   No results for input(s): IRON, TIBC, FERRITIN in the last 168 hours. Inpatient medications:  acetaminophen   1,000 mg Oral Q8H   amiodarone   200 mg Oral Daily   apixaban   5 mg Oral BID   Chlorhexidine  Gluconate Cloth  6 each Topical Q0600   Chlorhexidine  Gluconate Cloth  6 each Topical Q0600   cinacalcet   30 mg Oral Q M,W,F-HD   pantoprazole    40 mg Oral Daily   PARoxetine   40 mg Oral Daily   pneumococcal 20-valent conjugate vaccine  0.5 mL Intramuscular Tomorrow-1000   senna-docusate  1 tablet Oral QHS   sodium chloride  flush  3 mL Intravenous Q12H   sodium chloride  flush  3 mL Intravenous Q12H   sucroferric oxyhydroxide  500 mg Oral TID WC   tamsulosin   0.4 mg Oral Daily     acetaminophen  **OR** acetaminophen , bisacodyl , hydrALAZINE , HYDROmorphone  (DILAUDID ) injection, ipratropium, ondansetron  **OR** ondansetron  (ZOFRAN ) IV, oxyCODONE , traZODone 

## 2024-03-29 NOTE — Progress Notes (Signed)
 Contacted by CSW regarding pt's need for out-pt HD clinic in [place] due to snf placement. Pt is currently a [facility] pt therefore referral made to [facility] to assist with a smother transition back to home clinic once pt returns home from snf. Referral submitted this afternoon for review. Update provided to nephrologist and renal NP as well. Will assist as needed.   Randine Mungo Dialysis Navigator [phone]

## 2024-03-29 NOTE — Progress Notes (Signed)
 Physical Therapy Treatment Patient Details Name: Christopher [name] MRN: [id number] DOB: [birth year] Today's Date: 03/29/2024   History of Present Illness 74 yo M adm 1/4 s/p fall with L greater trochanter fx, likely non-op management, WBAT, no active abduction; Ortho ordered MRI to r/o further fxs and found L intertrochanteric fx, s/p Surgical fixation, still WBAT with no active abduction; PMH ESRD with HD, Afib on Eliquis , bladder carcinoma, depression anxiety, HTN    PT Comments  Patient progressing with ambulation distance/tolerance and balance with less support.  Still rather antalgic with with significantly slowed walking speed.  Remains high risk for falls.  PT will continue to follow while in the acute setting.  Remains appropriate for post-acute rehab at d/c.     If plan is discharge home, recommend the following: A little help with bathing/dressing/bathroom;A little help with walking and/or transfers   Can travel by private vehicle     No  Equipment Recommendations  Rolling walker (2 wheels);BSC/3in1    Recommendations for Other Services       Precautions / Restrictions Precautions Precautions: Fall Precaution/Restrictions Comments: WBAT, no active abduction Restrictions LLE Weight Bearing Per Provider Order: Weight bearing as tolerated Other Position/Activity Restrictions: No active abduction     Mobility  Bed Mobility Overal bed mobility: Needs Assistance Bed Mobility: Supine to Sit, Sit to Supine     Supine to sit: HOB elevated, Min assist Sit to supine: Contact guard assist   General bed mobility comments: up to EOB assist for L LE; to supine using gait belt to help with L LE, cues for positioning    Transfers Overall transfer level: Needs assistance Equipment used: Rolling walker (2 wheels) Transfers: Sit to/from Stand Sit to Stand: Min assist           General transfer comment: assist for balance coming up with cues for hand placement     Ambulation/Gait Ambulation/Gait assistance: Contact guard assist, Supervision Gait Distance (Feet): 50 Feet Assistive device: Rolling walker (2 wheels) Gait Pattern/deviations: Step-to pattern, Step-through pattern, Decreased stride length, Trunk flexed, Wide base of support, Antalgic       General Gait Details: increased time, chair follow for part of session though pt set goal to walk back to bed in case transport came to take him to dialysis; stopped to rest arms partway as walker too tall, lowered after ambulation   Stairs             Wheelchair Mobility     Tilt Bed    Modified Rankin (Stroke Patients Only)       Balance Overall balance assessment: Needs assistance Sitting-balance support: Feet supported Sitting balance-Leahy Scale: Good     Standing balance support: Bilateral upper extremity supported Standing balance-Leahy Scale: Poor                              Communication Communication Communication: Impaired Factors Affecting Communication: Hearing impaired  Cognition Arousal: Alert Behavior During Therapy: WFL for tasks assessed/performed   PT - Cognitive impairments: Memory                       PT - Cognition Comments: asked about hip precautions, pt stated can't put weight on it, reminded he can put weight as tolerated just no kicking out to the side Following commands: Intact      Cueing Cueing Techniques: Verbal cues  Exercises General Exercises - Lower Extremity Long  Arc Quad: AROM, 10 reps, Seated, Both    General Comments        Pertinent Vitals/Pain Pain Assessment Pain Score: 4  Pain Location: LLE Pain Descriptors / Indicators: Discomfort, Guarding, Operative site guarding Pain Intervention(s): Monitored during session, Repositioned, Premedicated before session    Home Living                          Prior Function            PT Goals (current goals can now be found in the care plan  section) Progress towards PT goals: Progressing toward goals    Frequency    Min 2X/week      PT Plan      Co-evaluation              AM-PAC PT 6 Clicks Mobility   Outcome Measure  Help needed turning from your back to your side while in a flat bed without using bedrails?: A Lot Help needed moving from lying on your back to sitting on the side of a flat bed without using bedrails?: A Lot Help needed moving to and from a bed to a chair (including a wheelchair)?: A Little Help needed standing up from a chair using your arms (e.g., wheelchair or bedside chair)?: A Little Help needed to walk in hospital room?: A Little Help needed climbing 3-5 steps with a railing? : Total 6 Click Score: 14    End of Session Equipment Utilized During Treatment: Gait belt Activity Tolerance: Patient tolerated treatment well Patient left: in bed;with call bell/phone within reach   PT Visit Diagnosis: Other abnormalities of gait and mobility (R26.89);Pain Pain - Right/Left: Left Pain - part of body: Hip     Time: 1640-1705 PT Time Calculation (min) (ACUTE ONLY): 25 min  Charges:    $Gait Training: 8-22 mins $Therapeutic Activity: 8-22 mins PT General Charges $$ ACUTE PT VISIT: 1 Visit                     Micheline Portal, PT Acute Rehabilitation Services [phone] 03/29/2024    Montie Portal 03/29/2024, 5:19 PM

## 2024-03-30 DIAGNOSIS — S72002A Fracture of unspecified part of neck of left femur, initial encounter for closed fracture: Secondary | ICD-10-CM | POA: Diagnosis not present

## 2024-03-30 LAB — CULTURE, RESPIRATORY W GRAM STAIN

## 2024-03-30 LAB — BASIC METABOLIC PANEL WITH GFR
Anion gap: 15 (ref 5–15)
BUN: 92 mg/dL — ABNORMAL HIGH (ref 8–23)
CO2: 26 mmol/L (ref 22–32)
Calcium: 10.7 mg/dL — ABNORMAL HIGH (ref 8.9–10.3)
Chloride: 94 mmol/L — ABNORMAL LOW (ref 98–111)
Creatinine, Ser: 9.38 mg/dL — ABNORMAL HIGH (ref 0.61–1.24)
GFR, Estimated: 5 mL/min — ABNORMAL LOW
Glucose, Bld: 82 mg/dL (ref 70–99)
Potassium: 5 mmol/L (ref 3.5–5.1)
Sodium: 136 mmol/L (ref 135–145)

## 2024-03-30 MED ORDER — CHLORHEXIDINE GLUCONATE CLOTH 2 % EX PADS
6.0000 | MEDICATED_PAD | Freq: Every day | CUTANEOUS | Status: DC
  Administered 2024-03-31 – 2024-04-01 (×2): 6 via TOPICAL

## 2024-03-30 MED ORDER — LIDOCAINE-PRILOCAINE 2.5-2.5 % EX CREA: 1.0000 | TOPICAL_CREAM | CUTANEOUS | Status: DC | PRN

## 2024-03-30 MED ORDER — HEPARIN SODIUM (PORCINE) 1000 UNIT/ML DIALYSIS
2000.0000 [IU] | Freq: Once | INTRAMUSCULAR | Status: AC
  Administered 2024-03-30: 2000 [IU] via INTRAVENOUS_CENTRAL

## 2024-03-30 MED ORDER — LIDOCAINE HCL (PF) 1 % IJ SOLN: 5.0000 mL | INTRAMUSCULAR | Status: DC | PRN

## 2024-03-30 MED ORDER — HEPARIN SODIUM (PORCINE) 1000 UNIT/ML DIALYSIS: 1000.0000 [IU] | INTRAMUSCULAR | Status: DC | PRN

## 2024-03-30 MED ORDER — HEPARIN SODIUM (PORCINE) 1000 UNIT/ML IJ SOLN
INTRAMUSCULAR | Status: AC
  Filled 2024-03-30: qty 4

## 2024-03-30 MED ORDER — PENTAFLUOROPROP-TETRAFLUOROETH EX AERO: 1.0000 | INHALATION_SPRAY | CUTANEOUS | Status: DC | PRN

## 2024-03-30 MED ORDER — ALTEPLASE 2 MG IJ SOLR: 2.0000 mg | Freq: Once | INTRAMUSCULAR | Status: DC | PRN

## 2024-03-30 MED ORDER — NEPRO/CARBSTEADY PO LIQD: 237.0000 mL | ORAL | Status: DC | PRN

## 2024-03-30 MED ORDER — ANTICOAGULANT SODIUM CITRATE 4% (200MG/5ML) IV SOLN: 5.0000 mL | Status: DC | PRN

## 2024-03-30 NOTE — Progress Notes (Signed)
 " PROGRESS NOTE  Gurtej Noyola FMW:[id number] DOB: 01-Nov-1950 DOA: 03/25/2024 PCP: Purcell Emil Schanz, MD   LOS: 5 days   Brief narrative:  Florentino Laabs is a  74 y.o male with history of ESRD (TTS), A-fib (on Eliquis ), HTN, anxiety, depression, bladder cancer, presented to hospital with dizziness after dialysis and had a mechanical fall with left hip pain.  Did not lose consciousness.  In the ED patient was slightly bradycardic.  Labs were normal except for creatinine elevation at 6.0.  X-ray of the left hip showed minimally displaced  greater trochanteric fracture of proximal left femur.  CT abdomen and pelvis showed minimally displaced fracture of the greater trochanter on the left.  Orthopedics was consulted and patient was admitted to the hospital for further evaluation and treatment.   Assessment/Plan: Principal Problem:   Closed left hip fracture, initial encounter [facility]) Active Problems:   Primary hypertension   Depression   Controlled atrial fibrillation (HCC)   Transitional cell carcinoma of bladder 2007   GERD (gastroesophageal reflux disease)   ESRD (end stage renal disease) (HCC)   Closed left hip fracture, initial encounter Status post mechanical fall.  X-ray and CT scan with minimally displaced greater trochanter fracture.MRI of the hip was performed and patient underwent IM nailing of the left hip on 03/27/2024 by Dr. Celena,  Orthopedics.  PT OT has seen the patient  and recommend skilled nursing facility placement.  Mild hyperkalemia.  Adjusted during hemodialysis.  Latest potassium of 5.0    persistent atrial fibrillation Rate well-controlled on amiodarone , continue Eliquis  for anticoagulation..  Review of 2D echocardiogram from April 2023 with LV ejection fraction of 60 to 65%.  Depression Continue paroxetine .   Primary hypertension Fluctuates with dialysis.  Continue to monitor during hospitalization.  Latest blood pressure 94/52 during hemodialysis.    Transitional cell carcinoma of bladder 2007 In remission.   ESRD (end stage renal disease)  On hemodialysis-Tuesdays, Thursdays, Saturdays.  Nephrology on board for hemodialysis needs.    Bilateral foot tingling.  Might be neuropathy.  Supportive care.   DVT prophylaxis: SCDs Start: 03/25/24 1415 apixaban  (ELIQUIS ) tablet 5 mg Will restart Eliquis   Disposition: Skilled nursing facility as per PT evaluation.  Medically stable for disposition.  Status is: Inpatient Remains inpatient appropriate because: Need for rehabilitation.    Code Status:     Code Status: Full Code  Family Communication: Spoke with patient's sister on 03/30/2024.  Consultants: Orthopedics  Procedures: IM nailing of the left hip on 03/27/2024 by Dr. Celena orthopedics  Anti-infectives:   Anti-infectives (From admission, onward)    Start     Dose/Rate Route Frequency Ordered Stop   03/27/24 0755  ceFAZolin  (ANCEF ) 2-4 GM/100ML-% IVPB       Note to Pharmacy: [name]: cabinet override      03/27/24 0755 03/27/24 2014        Subjective: Today, patient was seen and examined at bedside.  Patient seen during hemodialysis.  Denies any nausea vomiting fever chills or shortness of breath.  Denies overt pain, dyspnea  Objective: Vitals:   03/30/24 1130 03/30/24 1203  BP: 101/60 (!) 94/52  Pulse: 73 73  Resp: 14 16  Temp:    SpO2: 95% 96%    Intake/Output Summary (Last 24 hours) at 03/30/2024 1207 Last data filed at 03/30/2024 0406 Gross per 24 hour  Intake --  Output 550 ml  Net -550 ml   Filed Weights   03/27/24 1732 03/30/24 0405 03/30/24 9164  Weight: 84.1 kg 88.7 kg 88.7 kg   Body mass index is 28.88 kg/m (pended).   Physical Exam:  General:  Average built, not in obvious distress, Communicative HENT:   No scleral pallor or icterus noted. Oral mucosa is moist.  Chest:  Clear breath sounds.  Diminished breath sounds bilaterally. No crackles or wheezes.  CVS: S1 &S2 heard. No  murmur.  Regular rate and rhythm. Abdomen: Soft, nontender, nondistended.  Bowel sounds are heard.   Extremities: No cyanosis, clubbing or edema.  Peripheral pulses are palpable.  Right upper extremity AV fistula in place, left hip surgical incision site with dressing. Psych: Alert, awake and oriented, normal mood CNS:  No cranial nerve deficits.  Moving all extremities Skin: Warm and dry.  No rashes noted.   Data Review: I have personally reviewed the following laboratory data and studies,  CBC: Recent Labs  Lab 03/25/24 1156 03/27/24 0607 03/28/24 0503 03/29/24 0704  WBC 10.4 7.5 11.7* 7.9  NEUTROABS 8.2*  --   --   --   HGB 15.6 14.7 13.6 13.3  HCT 47.5 44.8 40.2 39.9  MCV 92.2 94.1 91.4 91.5  PLT 264 225 242 236   Basic Metabolic Panel: Recent Labs  Lab 03/25/24 1156 03/26/24 0225 03/27/24 0607 03/28/24 0503 03/29/24 0704 03/30/24 0545  NA 138 138 137 135 135 136  K 4.3 4.4 5.7* 4.7 4.6 5.0  CL 98 98 98 95* 94* 94*  CO2 25 27 26 27 27 26   GLUCOSE 90 76 100* 126* 93 82  BUN 38* 51* 72* 56* 78* 92*  CREATININE 6.05* 6.82* 8.24* 7.01* 8.58* 9.38*  CALCIUM 10.2 10.1 10.2 10.0 10.2 10.7*  MG 2.1  --   --   --   --   --   PHOS 2.9  --   --   --   --   --    Liver Function Tests: No results for input(s): AST, ALT, ALKPHOS, BILITOT, PROT, ALBUMIN  in the last 168 hours. No results for input(s): LIPASE, AMYLASE in the last 168 hours. No results for input(s): AMMONIA in the last 168 hours. Cardiac Enzymes: No results for input(s): CKTOTAL, CKMB, CKMBINDEX, TROPONINI in the last 168 hours. BNP (last 3 results) No results for input(s): BNP in the last 8760 hours.  ProBNP (last 3 results) No results for input(s): PROBNP in the last 8760 hours.  CBG: No results for input(s): GLUCAP in the last 168 hours. Recent Results (from the past 240 hours)  MRSA Next Gen by PCR, Nasal     Status: None   Collection Time: 03/25/24  3:36 AM    Specimen: Nasal Mucosa; Nasal Swab  Result Value Ref Range Status   MRSA by PCR Next Gen NOT DETECTED NOT DETECTED Final    Comment: (NOTE) The GeneXpert MRSA Assay (FDA approved for NASAL specimens only), is one component of a comprehensive MRSA colonization surveillance program. It is not intended to diagnose MRSA infection nor to guide or monitor treatment for MRSA infections. Test performance is not FDA approved in patients less than 61 years old. Performed at [facility], [address] [place], [place] [address]   Expectorated Sputum Assessment w Gram Stain, Rflx to Resp Cult     Status: None   Collection Time: 03/26/24 11:56 PM   Specimen: Expectorated Sputum  Result Value Ref Range Status   Specimen Description EXPECTORATED SPUTUM  Final   Special Requests NONE  Final   Sputum evaluation  Final    THIS SPECIMEN IS ACCEPTABLE FOR SPUTUM CULTURE Performed at [facility], [address], [place], [place] [address]    Report Status 03/27/2024 FINAL  Final  Culture, Respiratory w Gram Stain     Status: None (Preliminary result)   Collection Time: 03/26/24 11:56 PM  Result Value Ref Range Status   Specimen Description EXPECTORATED SPUTUM  Final   Special Requests NONE Reflexed from [id number]  Final   Gram Stain   Final    MODERATE WBC PRESENT, PREDOMINANTLY PMN MODERATE GRAM POSITIVE COCCI    Culture   Final    ABUNDANT STREPTOCOCCUS PNEUMONIAE SUSCEPTIBILITIES TO FOLLOW Performed at [facility], [address] [place], [place] [address]    Report Status PENDING  Incomplete     Studies: No results found.     Mayme Profeta, MD  [facility] 03/30/2024  If 7PM-7AM, please contact night-coverage             "

## 2024-03-30 NOTE — Progress Notes (Signed)
 [facility] Progress Note  Subjective:  Seen in room, no c/o's SNF placement pending   Vitals:   03/30/24 1030 03/30/24 1100 03/30/24 1130 03/30/24 1203  BP: 109/76 101/72 101/60 98/69  Pulse: 71 72 73 73  Resp: 15 15 14 16   Temp:    97.8 F (36.6 C)  TempSrc:      SpO2: 96% 96% 95% 96%  Weight:    86.2 kg  Height:        Exam: Gen alert, no distress Sclera anicteric, throat clear  No jvd or bruits Chest clear bilat to bases RRR no MRG Abd soft ntnd no mass or ascites +bs Ext no LE or UE edema, no other edema Neuro is alert, Ox 3 , nf    RUA AVF+bruit    Home bp meds: No bp lowering meds    [place] HD: [place] [facility] 4h  b400  79kg  2K bath  AVF  Hep 2000 Last HD 1/03, post wt 79.6kg Gets close to dry weight on most/almost all treatments   Assessment/ Plan:   # ESRD - on HD TTS, last HD Saturday - HD today off schedule (rollover from yesterday) - next HD Sat      # BP - Is not on BP lowering meds at home - follow blood pressure     # Volume - No volume excess on exam - UF 2- 3 w/ next HD     # 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   No results for input(s): IRON, TIBC, FERRITIN in the last 168 hours. Inpatient medications:  acetaminophen   1,000 mg Oral Q8H   amiodarone   200 mg Oral Daily   apixaban   5 mg Oral BID   Chlorhexidine  Gluconate Cloth  6 each Topical Q0600   Chlorhexidine  Gluconate Cloth  6 each Topical Q0600   cinacalcet   30 mg Oral Q M,W,F-HD   pantoprazole   40 mg  Oral Daily   PARoxetine   40 mg Oral Daily   pneumococcal 20-valent conjugate vaccine  0.5 mL Intramuscular Tomorrow-1000   senna-docusate  1 tablet Oral QHS   sodium chloride  flush  3 mL Intravenous Q12H   sodium chloride  flush  3 mL Intravenous Q12H   sucroferric oxyhydroxide  500 mg Oral TID WC   tamsulosin   0.4 mg Oral Daily    anticoagulant sodium citrate       acetaminophen  **OR** acetaminophen , alteplase , anticoagulant sodium citrate , bisacodyl , feeding supplement (NEPRO CARB STEADY), heparin , [START ON 03/31/2024] heparin , hydrALAZINE , HYDROmorphone  (DILAUDID ) injection, ipratropium, lidocaine  (PF), lidocaine -prilocaine , ondansetron  **OR** ondansetron  (ZOFRAN ) IV, oxyCODONE , pentafluoroprop-tetrafluoroeth, traZODone 

## 2024-03-30 NOTE — TOC Progression Note (Addendum)
 Transition of Care [facility]) - Progression Note    Patient Details  Name: Christopher [name] MRN: [id number] Date of Birth: 1950/04/23  Transition [facility]) CM/SW Contact  Bridget Cordella Simmonds, LCSW Phone Number: 03/30/2024, 10:45 AM  Clinical Narrative:   PASSR received: [id number] A  1200: see renal navigator note: outpt HD in [place] not yet available.  Will be next week.    Message left with [name]: hopeful for admission there early next week.   Expected Discharge Plan: Skilled Nursing Facility Barriers to Discharge: Continued Medical Work up, SNF Pending bed offer               Expected Discharge Plan and Services In-house Referral: Clinical Social Work   Post Acute Care Choice: Skilled Nursing Facility Living arrangements for the past 2 months: Single Family Home                                       Social Drivers of Health (SDOH) Interventions SDOH Screenings   Food Insecurity: No Food Insecurity (03/25/2024)  Housing: Low Risk (03/25/2024)  Transportation Needs: No Transportation Needs (03/25/2024)  Utilities: Not At Risk (03/25/2024)  Alcohol Screen: Low Risk (12/14/2021)  Depression (PHQ2-9): Low Risk (12/14/2021)  Financial Resource Strain: Low Risk (12/14/2021)  Physical Activity: Inactive (12/14/2021)  Social Connections: Moderately Isolated (03/25/2024)  Stress: No Stress Concern Present (12/14/2021)  Tobacco Use: Medium Risk (03/27/2024)    Readmission Risk Interventions     No data to display

## 2024-03-30 NOTE — Progress Notes (Addendum)
 Contacted [facility] admissions to request an update on pt's referral. Will await update and determination. Will assist as needed.   Randine Mungo Dialysis Navigator [phone]  Addendum at 12:05 pm: Pt's referral has been discussed with [facility] staff this morning. MD at clinic feels pt needs to receive HD in chair prior to d/c and before pt can be medically cleared for admission to clinic. Pt will need to receive HD in chair and then update will need to be provided to [facility]. Update will need to be provided to clinic on Monday and if pt is approved for clinic, then pt could hopefully start at clinic on Wed. This info was provided to attending, nephrologist, renal PA, pt's RN, and CSW.

## 2024-03-30 NOTE — Progress Notes (Signed)
" °   03/30/24 1203  Vitals  Temp 97.8 F (36.6 C)  Pulse Rate 73  Resp 16  BP 98/69  SpO2 96 %  O2 Device Room Air  Weight 86.2 kg  Type of Weight Post-Dialysis  Oxygen Therapy  Pulse Oximetry Type Continuous  Oximetry Probe Site Changed No  Post Treatment  Dialyzer Clearance Lightly streaked  Hemodialysis Intake (mL) 0 mL  Liters Processed 78  Fluid Removed (mL) 2500 mL  Tolerated HD Treatment Yes  AVG/AVF Arterial Site Held (minutes) 10 minutes  AVG/AVF Venous Site Held (minutes) 10 minutes   Received patient in bed to unit.  Alert and oriented.  Informed consent signed and in chart.   TX duration:3.5  Patient tolerated well.  Transported back to the room  Alert, without acute distress.  Hand-off given to patient's nurse.   Access used: RUAF Access issues: no complications  Total UF removed: 2500 Medication(s) given: none   Delon LITTIE [facility] "

## 2024-03-31 DIAGNOSIS — S72002A Fracture of unspecified part of neck of left femur, initial encounter for closed fracture: Secondary | ICD-10-CM | POA: Diagnosis not present

## 2024-03-31 LAB — CBC
HCT: 40.7 % (ref 39.0–52.0)
Hemoglobin: 13.7 g/dL (ref 13.0–17.0)
MCH: 30.4 pg (ref 26.0–34.0)
MCHC: 33.7 g/dL (ref 30.0–36.0)
MCV: 90.4 fL (ref 80.0–100.0)
Platelets: 283 K/uL (ref 150–400)
RBC: 4.5 MIL/uL (ref 4.22–5.81)
RDW: 14.8 % (ref 11.5–15.5)
WBC: 7.4 K/uL (ref 4.0–10.5)
nRBC: 0 % (ref 0.0–0.2)

## 2024-03-31 LAB — RENAL FUNCTION PANEL
Albumin: 3.6 g/dL (ref 3.5–5.0)
Anion gap: 14 (ref 5–15)
BUN: 71 mg/dL — ABNORMAL HIGH (ref 8–23)
CO2: 27 mmol/L (ref 22–32)
Calcium: 10.6 mg/dL — ABNORMAL HIGH (ref 8.9–10.3)
Chloride: 94 mmol/L — ABNORMAL LOW (ref 98–111)
Creatinine, Ser: 7.3 mg/dL — ABNORMAL HIGH (ref 0.61–1.24)
GFR, Estimated: 7 mL/min — ABNORMAL LOW
Glucose, Bld: 138 mg/dL — ABNORMAL HIGH (ref 70–99)
Phosphorus: 5.5 mg/dL — ABNORMAL HIGH (ref 2.5–4.6)
Potassium: 4.7 mmol/L (ref 3.5–5.1)
Sodium: 134 mmol/L — ABNORMAL LOW (ref 135–145)

## 2024-03-31 LAB — GLUCOSE, CAPILLARY: Glucose-Capillary: 132 mg/dL — ABNORMAL HIGH (ref 70–99)

## 2024-03-31 MED ORDER — LIDOCAINE HCL (PF) 1 % IJ SOLN: 5.0000 mL | INTRAMUSCULAR | Status: DC | PRN

## 2024-03-31 MED ORDER — HEPARIN SODIUM (PORCINE) 1000 UNIT/ML IJ SOLN
INTRAMUSCULAR | Status: AC
  Filled 2024-03-31: qty 2

## 2024-03-31 MED ORDER — ALTEPLASE 2 MG IJ SOLR: 2.0000 mg | Freq: Once | INTRAMUSCULAR | Status: DC | PRN

## 2024-03-31 MED ORDER — ANTICOAGULANT SODIUM CITRATE 4% (200MG/5ML) IV SOLN: 5.0000 mL | Status: DC | PRN

## 2024-03-31 MED ORDER — LIDOCAINE-PRILOCAINE 2.5-2.5 % EX CREA: 1.0000 | TOPICAL_CREAM | CUTANEOUS | Status: DC | PRN

## 2024-03-31 MED ORDER — HEPARIN SODIUM (PORCINE) 1000 UNIT/ML DIALYSIS: 1000.0000 [IU] | INTRAMUSCULAR | Status: DC | PRN

## 2024-03-31 MED ORDER — HEPARIN SODIUM (PORCINE) 1000 UNIT/ML DIALYSIS
2000.0000 [IU] | Freq: Once | INTRAMUSCULAR | Status: AC
  Administered 2024-03-31: 2000 [IU] via INTRAVENOUS_CENTRAL

## 2024-03-31 MED ORDER — PENTAFLUOROPROP-TETRAFLUOROETH EX AERO: 1.0000 | INHALATION_SPRAY | CUTANEOUS | Status: DC | PRN

## 2024-03-31 NOTE — Progress Notes (Addendum)
" °   03/31/24 1639  Vitals  Temp 97.7 F (36.5 C)  Pulse Rate 60  Resp 19  BP 114/62  SpO2 96 %  O2 Device Room Air  Type of Weight Post-Dialysis  Oxygen Therapy  Pulse Oximetry Type Continuous  Oximetry Probe Site Changed No  Post Treatment  Dialyzer Clearance Lightly streaked  Liters Processed 72  Fluid Removed (mL) 1100 mL  Tolerated HD Treatment Yes  Post-Hemodialysis Comments asymptomatic hypotension causing UF to be paused   Received patient in chair to unit.  Alert and oriented.  Informed consent signed and in chart.   TX duration: 3.0 hours  Patient tolerated well.  Transported back to the room  Alert, without acute distress.  Hand-off given to patient's nurse.   Access used: AVF Access issues: None  Total UF removed: 1100 ml of 3000 ml targeted due to asymptomatic hypotension Medication(s) given: Heparin  2000 unit bolus Post HD weight: 85.0 kg, estimated due to in chair and inability to stand without walker present Post HD VS: see data above   [name] "

## 2024-03-31 NOTE — Progress Notes (Signed)
 " PROGRESS NOTE  Christopher [name] FMW:[id number] DOB: [birth year] DOA: 03/25/2024 PCP: Purcell Emil Schanz, MD   LOS: 6 days   Brief narrative:  Christopher [name] is a  74 y.o male with history of ESRD (TTS), A-fib (on Eliquis ), HTN, anxiety, depression, bladder cancer, presented to hospital with dizziness after dialysis and had a mechanical fall with left hip pain.  Did not lose consciousness.  In the ED patient was slightly bradycardic.  Labs were normal except for creatinine elevation at 6.0.  X-ray of the left hip showed minimally displaced  greater trochanteric fracture of proximal left femur.  CT abdomen and pelvis showed minimally displaced fracture of the greater trochanter on the left.  Orthopedics was consulted and patient was admitted to the hospital for further evaluation and treatment.   Assessment/Plan: Principal Problem:   Closed left hip fracture, initial encounter [facility]) Active Problems:   Primary hypertension   Depression   Controlled atrial fibrillation (HCC)   Transitional cell carcinoma of bladder 2007   GERD (gastroesophageal reflux disease)   ESRD (end stage renal disease) (HCC)   Closed left hip fracture, initial encounter Status post mechanical fall.  X-ray and CT scan with minimally displaced greater trochanter fracture.MRI of the hip was performed and patient underwent IM nailing of the left hip on 03/27/2024 by Dr. Celena,  Orthopedics.  PT OT has seen the patient  and recommend skilled nursing facility placement.  Mild hyperkalemia.  Adjusted during hemodialysis.  Latest potassium of 5.0    persistent atrial fibrillation Rate well-controlled on amiodarone , continue Eliquis  for anticoagulation..  Review of 2D echocardiogram from April 2023 with LV ejection fraction of 60 to 65%.  Depression Continue paroxetine .   Primary hypertension Fluctuates with dialysis.  Continue to monitor during hospitalization.    Transitional cell carcinoma of bladder 2007 In  remission.   ESRD (end stage renal disease)  On hemodialysis-Tuesdays, Thursdays, Saturdays.  Nephrology on board for hemodialysis needs.    Bilateral foot tingling.  Might be neuropathy.  Supportive care.   DVT prophylaxis: SCDs Start: 03/25/24 1415 apixaban  (ELIQUIS ) tablet 5 mg Will restart Eliquis   Disposition: Skilled nursing facility as per PT evaluation.  Medically stable for disposition.  Status is: Inpatient Remains inpatient appropriate because: Need for rehabilitation.    Code Status:     Code Status: Full Code  Family Communication: Spoke with patient's sister on 03/30/2024.  Consultants: Orthopedics  Procedures: IM nailing of the left hip on 03/27/2024 by Dr. Celena orthopedics  Anti-infectives:   Anti-infectives (From admission, onward)    Start     Dose/Rate Route Frequency Ordered Stop   03/27/24 0755  ceFAZolin  (ANCEF ) 2-4 GM/100ML-% IVPB       Note to Pharmacy: [name]: cabinet override      03/27/24 0755 03/27/24 2014        Subjective: Today, patient was seen and examined at bedside.  Patient denies any nausea vomiting fever chills or rigor.  Has had bowel movements.  Denies overt pain.  Objective: Vitals:   03/31/24 0310 03/31/24 0807  BP: 130/75 128/79  Pulse: (!) 57 (!) 56  Resp: 19 18  Temp: 98.2 F (36.8 C) 97.6 F (36.4 C)  SpO2: 97% 96%    Intake/Output Summary (Last 24 hours) at 03/31/2024 0912 Last data filed at 03/30/2024 1203 Gross per 24 hour  Intake --  Output 2500 ml  Net -2500 ml   Filed Weights   03/30/24 0835 03/30/24 1203 03/31/24 0500  Weight: 88.7 kg 86.2 kg 86 kg   Body mass index is 28 kg/m (pended).   Physical Exam:  General:  Average built, not in obvious distress, Communicative HENT:   No scleral pallor or icterus noted. Oral mucosa is moist.  Chest:  Clear breath sounds.   No crackles or wheezes.  CVS: S1 &S2 heard. No murmur.  Regular rate and rhythm. Abdomen: Soft, nontender, nondistended.   Bowel sounds are heard.   Extremities: No cyanosis, clubbing or edema.  Peripheral pulses are palpable.  Right upper extremity AV fistula in place, left hip surgical incision site with dressing. Psych: Alert, awake and oriented, normal mood CNS:  No cranial nerve deficits.  Moving all extremities Skin: Warm and dry.  No rashes noted.   Data Review: I have personally reviewed the following laboratory data and studies,  CBC: Recent Labs  Lab 03/25/24 1156 03/27/24 0607 03/28/24 0503 03/29/24 0704  WBC 10.4 7.5 11.7* 7.9  NEUTROABS 8.2*  --   --   --   HGB 15.6 14.7 13.6 13.3  HCT 47.5 44.8 40.2 39.9  MCV 92.2 94.1 91.4 91.5  PLT 264 225 242 236   Basic Metabolic Panel: Recent Labs  Lab 03/25/24 1156 03/26/24 0225 03/27/24 0607 03/28/24 0503 03/29/24 0704 03/30/24 0545  NA 138 138 137 135 135 136  K 4.3 4.4 5.7* 4.7 4.6 5.0  CL 98 98 98 95* 94* 94*  CO2 25 27 26 27 27 26   GLUCOSE 90 76 100* 126* 93 82  BUN 38* 51* 72* 56* 78* 92*  CREATININE 6.05* 6.82* 8.24* 7.01* 8.58* 9.38*  CALCIUM 10.2 10.1 10.2 10.0 10.2 10.7*  MG 2.1  --   --   --   --   --   PHOS 2.9  --   --   --   --   --    Liver Function Tests: No results for input(s): AST, ALT, ALKPHOS, BILITOT, PROT, ALBUMIN  in the last 168 hours. No results for input(s): LIPASE, AMYLASE in the last 168 hours. No results for input(s): AMMONIA in the last 168 hours. Cardiac Enzymes: No results for input(s): CKTOTAL, CKMB, CKMBINDEX, TROPONINI in the last 168 hours. BNP (last 3 results) No results for input(s): BNP in the last 8760 hours.  ProBNP (last 3 results) No results for input(s): PROBNP in the last 8760 hours.  CBG: Recent Labs  Lab 03/31/24 0804  GLUCAP 132*   Recent Results (from the past 240 hours)  MRSA Next Gen by PCR, Nasal     Status: None   Collection Time: 03/25/24  3:36 AM   Specimen: Nasal Mucosa; Nasal Swab  Result Value Ref Range Status   MRSA by PCR Next Gen  NOT DETECTED NOT DETECTED Final    Comment: (NOTE) The GeneXpert MRSA Assay (FDA approved for NASAL specimens only), is one component of a comprehensive MRSA colonization surveillance program. It is not intended to diagnose MRSA infection nor to guide or monitor treatment for MRSA infections. Test performance is not FDA approved in patients less than 4 years old. Performed at [facility], [address] [place], [place] [address]   Expectorated Sputum Assessment w Gram Stain, Rflx to Resp Cult     Status: None   Collection Time: 03/26/24 11:56 PM   Specimen: Expectorated Sputum  Result Value Ref Range Status   Specimen Description EXPECTORATED SPUTUM  Final   Special Requests NONE  Final   Sputum evaluation   Final  THIS SPECIMEN IS ACCEPTABLE FOR SPUTUM CULTURE Performed at [facility], [address], [place], [place] [address]    Report Status 03/27/2024 FINAL  Final  Culture, Respiratory w Gram Stain     Status: None   Collection Time: 03/26/24 11:56 PM  Result Value Ref Range Status   Specimen Description EXPECTORATED SPUTUM  Final   Special Requests NONE Reflexed from [id number]  Final   Gram Stain   Final    MODERATE WBC PRESENT, PREDOMINANTLY PMN MODERATE GRAM POSITIVE COCCI Performed at [facility], [address] [place], [place] [address]    Culture ABUNDANT STREPTOCOCCUS PNEUMONIAE  Final   Report Status 03/30/2024 FINAL  Final   Organism ID, Bacteria STREPTOCOCCUS PNEUMONIAE  Final      Susceptibility   Streptococcus pneumoniae - MIC*    ERYTHROMYCIN <=0.12 SENSITIVE Sensitive     LEVOFLOXACIN 1 SENSITIVE Sensitive     VANCOMYCIN 0.5 SENSITIVE Sensitive     PENICILLIN (meningitis) <=0.06 SENSITIVE Sensitive     PENO - penicillin <=0.06      PENICILLIN (non-meningitis) <=0.06 SENSITIVE Sensitive     PENICILLIN (oral) <=0.06 SENSITIVE Sensitive     CEFTRIAXONE (non-meningitis) <=0.12 SENSITIVE Sensitive     CEFTRIAXONE (meningitis) <=0.12  SENSITIVE Sensitive     * ABUNDANT STREPTOCOCCUS PNEUMONIAE     Studies: No results found.     Zaliah Wissner, MD  [facility] 03/31/2024  If 7PM-7AM, please contact night-coverage   "

## 2024-03-31 NOTE — Progress Notes (Signed)
 Mobility Specialist: Progress Note   03/31/24 1409  Mobility  Activity Ambulated with assistance  Level of Assistance Contact guard assist, steadying assist  Assistive Device Front wheel walker  Distance Ambulated (ft) 70 ft  LLE Weight Bearing Per Provider Order WBAT  Activity Response Tolerated well  Mobility Referral Yes  Mobility visit 1 Mobility  Mobility Specialist Start Time (ACUTE ONLY) 1050  Mobility Specialist Stop Time (ACUTE ONLY) 1114  Mobility Specialist Time Calculation (min) (ACUTE ONLY) 24 min    Pt received in bed, agreeable to mobility session. Adhering to WB well with cues. CGA for bed mobility to assist LLE. CGA for STS and hallway ambulation. C/o LLE pain but stated it's tolerable. Returned to room. Left in bed with all needs met, call bell in reach.   Ileana Lute Mobility Specialist Please contact via SecureChat or Rehab office at [phone]

## 2024-03-31 NOTE — Plan of Care (Signed)

## 2024-03-31 NOTE — Plan of Care (Signed)

## 2024-03-31 NOTE — Progress Notes (Signed)
 [facility] Progress Note  Subjective:  Seen in room, no c/o's Sitting in a chair for dialysis today   Vitals:   03/31/24 1324 03/31/24 1328 03/31/24 1345 03/31/24 1400  BP: 130/70 124/67 119/65 (!) 117/57  Pulse: (!) 50 (!) 51 (!) 51 (!) 53  Resp: 15 17 15 15   Temp: 97.7 F (36.5 C)     TempSrc:      SpO2: 95% 94% 94% 96%  Weight: 86 kg     Height:        Exam: Gen alert, no distress Sclera anicteric, throat clear  No jvd or bruits Chest clear bilat to bases RRR no MRG Abd soft ntnd no mass or ascites +bs Ext no LE or UE edema, no other edema Neuro is alert, Ox 3 , nf    RUA AVF+bruit    Home bp meds: No bp lowering meds    [facility]  b400  79kg  2K bath  AVF  Hep 2000 Last HD 1/03, post wt 79.6kg Gets close to dry weight on most/almost all treatments   Assessment/ Plan:   # ESRD - on HD TTS - HD today  - is up in a chair for dialysis today      # BP - Is not on BP lowering meds at home - follow blood pressure     # Volume - No volume excess on exam - UF 2- 3 L w/ next HD     # 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   No results for input(s): IRON, TIBC, FERRITIN in the last 168 hours. Inpatient medications:  acetaminophen   1,000 mg Oral Q8H   amiodarone   200 mg Oral Daily   apixaban   5 mg Oral BID   Chlorhexidine  Gluconate Cloth  6 each Topical Q0600   Chlorhexidine  Gluconate Cloth  6 each Topical Q0600   cinacalcet   30 mg  Oral Q M,W,F-HD   pantoprazole   40 mg Oral Daily   PARoxetine   40 mg Oral Daily   pneumococcal 20-valent conjugate vaccine  0.5 mL Intramuscular Tomorrow-1000   senna-docusate  1 tablet Oral QHS   sodium chloride  flush  3 mL Intravenous Q12H   sodium chloride  flush  3 mL Intravenous Q12H   sucroferric oxyhydroxide  500 mg Oral TID WC   tamsulosin   0.4 mg Oral Daily    anticoagulant sodium citrate       acetaminophen  **OR** acetaminophen , alteplase , anticoagulant sodium citrate , bisacodyl , [START ON 04/01/2024] heparin , heparin , hydrALAZINE , HYDROmorphone  (DILAUDID ) injection, ipratropium, lidocaine  (PF), lidocaine -prilocaine , ondansetron  **OR** ondansetron  (ZOFRAN ) IV, oxyCODONE , pentafluoroprop-tetrafluoroeth, traZODone 

## 2024-04-01 DIAGNOSIS — S72002A Fracture of unspecified part of neck of left femur, initial encounter for closed fracture: Secondary | ICD-10-CM | POA: Diagnosis not present

## 2024-04-01 LAB — CBC
HCT: 40.7 % (ref 39.0–52.0)
Hemoglobin: 13.7 g/dL (ref 13.0–17.0)
MCH: 30.5 pg (ref 26.0–34.0)
MCHC: 33.7 g/dL (ref 30.0–36.0)
MCV: 90.6 fL (ref 80.0–100.0)
Platelets: 283 K/uL (ref 150–400)
RBC: 4.49 MIL/uL (ref 4.22–5.81)
RDW: 14.6 % (ref 11.5–15.5)
WBC: 7.8 K/uL (ref 4.0–10.5)
nRBC: 0 % (ref 0.0–0.2)

## 2024-04-01 LAB — BASIC METABOLIC PANEL WITH GFR
Anion gap: 11 (ref 5–15)
BUN: 51 mg/dL — ABNORMAL HIGH (ref 8–23)
CO2: 30 mmol/L (ref 22–32)
Calcium: 10.4 mg/dL — ABNORMAL HIGH (ref 8.9–10.3)
Chloride: 93 mmol/L — ABNORMAL LOW (ref 98–111)
Creatinine, Ser: 6.3 mg/dL — ABNORMAL HIGH (ref 0.61–1.24)
GFR, Estimated: 9 mL/min — ABNORMAL LOW
Glucose, Bld: 96 mg/dL (ref 70–99)
Potassium: 4.6 mmol/L (ref 3.5–5.1)
Sodium: 133 mmol/L — ABNORMAL LOW (ref 135–145)

## 2024-04-01 MED ORDER — CHLORHEXIDINE GLUCONATE CLOTH 2 % EX PADS
6.0000 | MEDICATED_PAD | Freq: Every day | CUTANEOUS | Status: DC
  Administered 2024-04-02: 6 via TOPICAL

## 2024-04-01 NOTE — Plan of Care (Signed)
   Problem: Education: Goal: Knowledge of General Education information will improve Description: Including pain rating scale, medication(s)/side effects and non-pharmacologic comfort measures Outcome: Progressing   Problem: Health Behavior/Discharge Planning: Goal: Ability to manage health-related needs will improve Outcome: Progressing   Problem: Activity: Goal: Risk for activity intolerance will decrease Outcome: Progressing

## 2024-04-01 NOTE — Progress Notes (Signed)
 " PROGRESS NOTE  Christopher [name] FMW:[id number] DOB: 11-08-1950 DOA: 03/25/2024 PCP: Purcell Emil Schanz, MD   LOS: 7 days   Brief narrative:  Christopher [name] is a  74 y.o male with history of ESRD (TTS), A-fib (on Eliquis ), HTN, anxiety, depression, bladder cancer, presented to hospital with dizziness after dialysis and had a mechanical fall with left hip pain.  Did not lose consciousness.  In the ED patient was slightly bradycardic.  Labs were normal except for creatinine elevation at 6.0.  X-ray of the left hip showed minimally displaced  greater trochanteric fracture of proximal left femur.  CT abdomen and pelvis showed minimally displaced fracture of the greater trochanter on the left.  Orthopedics was consulted and patient was admitted to the hospital for further evaluation and treatment.   Assessment/Plan: Principal Problem:   Closed left hip fracture, initial encounter [facility]) Active Problems:   Primary hypertension   Depression   Controlled atrial fibrillation (HCC)   Transitional cell carcinoma of bladder 2007   GERD (gastroesophageal reflux disease)   ESRD (end stage renal disease) (HCC)  Closed left hip fracture, initial encounter Status post mechanical fall.  X-ray and CT scan with minimally displaced greater trochanter fracture.MRI of the hip was performed and patient underwent IM nailing of the left hip on 03/27/2024 by Dr. Celena,  Orthopedics.  PT OT has seen the patient  and recommend skilled nursing facility placement.  Medically stable for disposition at this time.  Mild hyperkalemia.  Adjusted during hemodialysis.  Latest potassium of 4.6    persistent atrial fibrillation Rate well-controlled on amiodarone , continue Eliquis  for anticoagulation..  Review of 2D echocardiogram from April 2023 with LV ejection fraction of 60 to 65%.  Depression Continue paroxetine .   Primary hypertension Fluctuates with dialysis.  Continue to monitor during hospitalization.     Transitional cell carcinoma of bladder 2007 In remission.   ESRD (end stage renal disease)  On hemodialysis-Tuesdays, Thursdays, Saturdays.  Nephrology on board for hemodialysis needs.    Bilateral foot tingling.  Might be neuropathy.  Supportive care.   DVT prophylaxis: SCDs Start: 03/25/24 1415 apixaban  (ELIQUIS ) tablet 5 mg Will restart Eliquis   Disposition: Skilled nursing facility as per PT evaluation.  Medically stable for disposition.  Status is: Inpatient Remains inpatient appropriate because: Need for rehabilitation.    Code Status:     Code Status: Full Code  Family Communication: Spoke with patient's sister on 03/30/2024.  Consultants: Orthopedics Nephrology  Procedures: IM nailing of the left hip on 03/27/2024 by Dr. Celena orthopedics Hemodialysis  Anti-infectives:   Anti-infectives (From admission, onward)    Start     Dose/Rate Route Frequency Ordered Stop   03/27/24 0755  ceFAZolin  (ANCEF ) 2-4 GM/100ML-% IVPB       Note to Pharmacy: [name]: cabinet override      03/27/24 0755 03/27/24 2014      Subjective: Today, patient was seen and examined at bedside.  Patient denies interval complaints.  Complains of mild pain.  No nausea vomiting.  Has had bowel movements.   Objective: Vitals:   04/01/24 0438 04/01/24 0726  BP: 128/73 111/72  Pulse: 65 60  Resp: 18   Temp: 98.4 F (36.9 C) 98 F (36.7 C)  SpO2: 97% 94%    Intake/Output Summary (Last 24 hours) at 04/01/2024 1023 Last data filed at 04/01/2024 0836 Gross per 24 hour  Intake --  Output 2020 ml  Net -2020 ml   Filed Weights   03/31/24  0500 03/31/24 1324 03/31/24 1639  Weight: 86 kg 86 kg 85 kg   Body mass index is 27.67 kg/m (pended).   Physical Exam:  General:  Average built, not in obvious distress, Communicative. HENT:   No scleral pallor or icterus noted. Oral mucosa is moist.  Chest:  Clear breath sounds.  Diminished breath sounds bilaterally. No crackles or  wheezes.  CVS: S1 &S2 heard. No murmur.  Regular rate and rhythm. Abdomen: Soft, nontender, nondistended.  Bowel sounds are heard.   Extremities: No cyanosis, clubbing or edema.  Peripheral pulses are palpable.  Right upper extremity AV fistula in place, left surgical incision site with dressing. Psych: Alert, awake and oriented, normal mood CNS:  No cranial nerve deficits.  Moves all extremities. Skin: Warm and dry.  No rashes noted.   Data Review: I have personally reviewed the following laboratory data and studies,  CBC: Recent Labs  Lab 03/25/24 1156 03/27/24 0607 03/28/24 0503 03/29/24 0704 03/31/24 1129 04/01/24 0703  WBC 10.4 7.5 11.7* 7.9 7.4 7.8  NEUTROABS 8.2*  --   --   --   --   --   HGB 15.6 14.7 13.6 13.3 13.7 13.7  HCT 47.5 44.8 40.2 39.9 40.7 40.7  MCV 92.2 94.1 91.4 91.5 90.4 90.6  PLT 264 225 242 236 283 283   Basic Metabolic Panel: Recent Labs  Lab 03/25/24 1156 03/26/24 0225 03/28/24 0503 03/29/24 0704 03/30/24 0545 03/31/24 1129 04/01/24 0703  NA 138   < > 135 135 136 134* 133*  K 4.3   < > 4.7 4.6 5.0 4.7 4.6  CL 98   < > 95* 94* 94* 94* 93*  CO2 25   < > 27 27 26 27 30   GLUCOSE 90   < > 126* 93 82 138* 96  BUN 38*   < > 56* 78* 92* 71* 51*  CREATININE 6.05*   < > 7.01* 8.58* 9.38* 7.30* 6.30*  CALCIUM 10.2   < > 10.0 10.2 10.7* 10.6* 10.4*  MG 2.1  --   --   --   --   --   --   PHOS 2.9  --   --   --   --  5.5*  --    < > = values in this interval not displayed.   Liver Function Tests: Recent Labs  Lab 03/31/24 1129  ALBUMIN  3.6   No results for input(s): LIPASE, AMYLASE in the last 168 hours. No results for input(s): AMMONIA in the last 168 hours. Cardiac Enzymes: No results for input(s): CKTOTAL, CKMB, CKMBINDEX, TROPONINI in the last 168 hours. BNP (last 3 results) No results for input(s): BNP in the last 8760 hours.  ProBNP (last 3 results) No results for input(s): PROBNP in the last 8760  hours.  CBG: Recent Labs  Lab 03/31/24 0804  GLUCAP 132*   Recent Results (from the past 240 hours)  MRSA Next Gen by PCR, Nasal     Status: None   Collection Time: 03/25/24  3:36 AM   Specimen: Nasal Mucosa; Nasal Swab  Result Value Ref Range Status   MRSA by PCR Next Gen NOT DETECTED NOT DETECTED Final    Comment: (NOTE) The GeneXpert MRSA Assay (FDA approved for NASAL specimens only), is one component of a comprehensive MRSA colonization surveillance program. It is not intended to diagnose MRSA infection nor to guide or monitor treatment for MRSA infections. Test performance is not FDA approved in patients less than 2 years  old. Performed at [facility], [address] [place], [place] [address]   Expectorated Sputum Assessment w Gram Stain, Rflx to Resp Cult     Status: None   Collection Time: 03/26/24 11:56 PM   Specimen: Expectorated Sputum  Result Value Ref Range Status   Specimen Description EXPECTORATED SPUTUM  Final   Special Requests NONE  Final   Sputum evaluation   Final    THIS SPECIMEN IS ACCEPTABLE FOR SPUTUM CULTURE Performed at [facility], [address], [place], [place] [address]    Report Status 03/27/2024 FINAL  Final  Culture, Respiratory w Gram Stain     Status: None   Collection Time: 03/26/24 11:56 PM  Result Value Ref Range Status   Specimen Description EXPECTORATED SPUTUM  Final   Special Requests NONE Reflexed from [id number]  Final   Gram Stain   Final    MODERATE WBC PRESENT, PREDOMINANTLY PMN MODERATE GRAM POSITIVE COCCI Performed at [facility], [address] [place], [place] [address]    Culture ABUNDANT STREPTOCOCCUS PNEUMONIAE  Final   Report Status 03/30/2024 FINAL  Final   Organism ID, Bacteria STREPTOCOCCUS PNEUMONIAE  Final      Susceptibility   Streptococcus pneumoniae - MIC*    ERYTHROMYCIN <=0.12 SENSITIVE Sensitive     LEVOFLOXACIN 1 SENSITIVE Sensitive     VANCOMYCIN 0.5 SENSITIVE Sensitive      PENICILLIN (meningitis) <=0.06 SENSITIVE Sensitive     PENO - penicillin <=0.06      PENICILLIN (non-meningitis) <=0.06 SENSITIVE Sensitive     PENICILLIN (oral) <=0.06 SENSITIVE Sensitive     CEFTRIAXONE (non-meningitis) <=0.12 SENSITIVE Sensitive     CEFTRIAXONE (meningitis) <=0.12 SENSITIVE Sensitive     * ABUNDANT STREPTOCOCCUS PNEUMONIAE     Studies: No results found.     Yailen Zemaitis, MD  [facility] 04/01/2024  If 7PM-7AM, please contact night-coverage   "

## 2024-04-01 NOTE — Progress Notes (Signed)
  [facility] Progress Note  Subjective:  Seen in room, no c/o's Sat in chair for HD yesterday w/o issues   Vitals:   03/31/24 1638 03/31/24 1639 04/01/24 0438 04/01/24 0726  BP: 109/69 114/62 128/73 111/72  Pulse: 60 60 65 60  Resp: 16 19 18    Temp:  97.7 F (36.5 C) 98.4 F (36.9 C) 98 F (36.7 C)  TempSrc:   Oral Oral  SpO2: 95% 96% 97% 94%  Weight:  85 kg    Height:        Exam: Gen alert, no distress Sclera anicteric, throat clear  No jvd or bruits Chest clear bilat to bases RRR no MRG Abd soft ntnd no mass or ascites +bs Ext no LE or UE edema, no other edema Neuro is alert, Ox 3 , nf    RUA AVF+bruit    Home bp meds: No bp lowering meds    [facility] 4[facility]  b400  79kg  2K bath  AVF  Hep 2000 Last HD 1/03, post wt 79.6kg Gets close to dry weight on most/almost all treatments   Assessment/ Plan:   # ESRD - on HD TTS - had HD Sat  - Sat in chair for HD Saturday w/o issues - plan HD off schedule tomorrow since new OP HD unit will likely be MWF      # BP - Is not on BP lowering meds at home - follow blood pressure     # Volume - No volume excess on exam - up by wts, no signs of vol overload     # 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   No results for input(s): IRON, TIBC, FERRITIN in the last 168 hours. Inpatient medications:  acetaminophen   1,000 mg Oral Q8H   amiodarone   200 mg Oral Daily   apixaban   5 mg Oral BID   Chlorhexidine  Gluconate Cloth  6 each Topical Q0600    Chlorhexidine  Gluconate Cloth  6 each Topical Q0600   cinacalcet   30 mg Oral Q M,W,F-HD   pantoprazole   40 mg Oral Daily   PARoxetine   40 mg Oral Daily   pneumococcal 20-valent conjugate vaccine  0.5 mL Intramuscular Tomorrow-1000   senna-docusate  1 tablet Oral QHS   sodium chloride  flush  3 mL Intravenous Q12H   sodium chloride  flush  3 mL Intravenous Q12H   sucroferric oxyhydroxide  500 mg Oral TID WC   tamsulosin   0.4 mg Oral Daily      acetaminophen  **OR** acetaminophen , bisacodyl , hydrALAZINE , HYDROmorphone  (DILAUDID ) injection, ipratropium, ondansetron  **OR** ondansetron  (ZOFRAN ) IV, oxyCODONE , traZODone 

## 2024-04-01 NOTE — Progress Notes (Signed)
 Mobility Specialist: Progress Note   04/01/24 1323  Mobility  Activity Ambulated with assistance  Level of Assistance Contact guard assist, steadying assist  Assistive Device Front wheel walker  Distance Ambulated (ft) 90 ft  LLE Weight Bearing Per Provider Order WBAT  Activity Response Tolerated well  Mobility Referral Yes  Mobility visit 1 Mobility  Mobility Specialist Start Time (ACUTE ONLY) 1100  Mobility Specialist Stop Time (ACUTE ONLY) 1122  Mobility Specialist Time Calculation (min) (ACUTE ONLY) 22 min    Pt received in bed, agreeable to mobility session. SV for bed mobility. Adhering to WB precaution well. CGA for STS and ambulation. Ambulated down the hallway and returned to room without fault. Left in chair with all needs met, call bell in reach. Chair alarm on.   Ileana Lute Mobility Specialist Please contact via SecureChat or Rehab office at [phone]

## 2024-04-02 DIAGNOSIS — S72002A Fracture of unspecified part of neck of left femur, initial encounter for closed fracture: Secondary | ICD-10-CM | POA: Diagnosis not present

## 2024-04-02 NOTE — Progress Notes (Signed)
 Mobility Specialist Progress Note:    04/02/24 1012  Mobility  Activity Ambulated with assistance (In hallway)  Level of Assistance Contact guard assist, steadying assist  Assistive Device Front wheel walker  Distance Ambulated (ft) 160 ft  LLE Weight Bearing Per Provider Order WBAT  Activity Response Tolerated well  Mobility Referral Yes  Mobility visit 1 Mobility  Mobility Specialist Start Time (ACUTE ONLY) 0950  Mobility Specialist Stop Time (ACUTE ONLY) 1012  Mobility Specialist Time Calculation (min) (ACUTE ONLY) 22 min   Received pt in bed and agreeable to mobility. Pt required MinG for safety. No c/o. Returned to room without fault. Left pt in bed with alarm on. Personal belongings and call light within reach. All needs met.  Lavanda Pollack Mobility Specialist  Please contact via [employer] or  Rehab Office [phone]

## 2024-04-02 NOTE — Progress Notes (Signed)
 Physical Therapy Treatment Patient Details Name: Christopher [name] MRN: [id number] DOB: 07/06/1950 Today's Date: 04/02/2024   History of Present Illness 74 yo M adm 1/4 s/p fall with L greater trochanter fx, likely non-op management, WBAT, no active abduction; Ortho ordered MRI to r/o further fxs and found L intertrochanteric fx, s/p Surgical fixation, still WBAT with no active abduction; PMH ESRD with HD, Afib on Eliquis , bladder carcinoma, depression anxiety, HTN    PT Comments  Pt received in supine, sleeping but easily awoken, pt agreeable to therapy session after premedication for pain. Pt reporting mild dizziness with standing, BP checked sitting/standing and pt found to have orthostatic hypotension, RN/MD notified. Pt may benefit from order for BLE knee-high TED hose to see if this helps stabilize BP. After seated LE exercises, pt agreeable to gait trial in room, distance limited due to mild dizziness, then after seated break, pt able to perform additional very short household distance gait trial with RW support, mod safety cues needed for transfers with decreased eccentric control observed when pt sitting. Pt needing mod cues and up to minA for bed mobility and transfers this date to RW. Patient will benefit from continued inpatient follow up therapy, <3 hours/day, pt needing increased assist from baseline and typically he performs a lot of cooking/housework at home since his wife has history of CVA, currently he is not able to perform these tasks due to LLE pain, L trochanteric hip precs, and orthostatic hypotension.    If plan is discharge home, recommend the following: A little help with bathing/dressing/bathroom;A little help with walking and/or transfers;Assistance with cooking/housework;Assist for transportation;Help with stairs or ramp for entrance   Can travel by private vehicle     No  Equipment Recommendations  Rolling walker (2 wheels);BSC/3in1    Recommendations for Other  Services       Precautions / Restrictions Precautions Precautions: Fall Recall of Precautions/Restrictions: Intact Precaution/Restrictions Comments: WBAT, no active abduction Restrictions Weight Bearing Restrictions Per Provider Order: Yes LLE Weight Bearing Per Provider Order: Weight bearing as tolerated Other Position/Activity Restrictions: No active abduction     Mobility  Bed Mobility Overal bed mobility: Needs Assistance Bed Mobility: Supine to Sit     Supine to sit: Contact guard, HOB elevated     General bed mobility comments: up to EOB, pt using gait belt to help with L LE, CGA for safety    Transfers Overall transfer level: Needs assistance Equipment used: Rolling walker (2 wheels) Transfers: Sit to/from Stand Sit to Stand: Min assist           General transfer comment: from EOB minA to rise, cues for safe UE placement initially needed and to advance hips prior to standing. MinA also needed for stand>sit to chair with decreased eccentric control to sit. From chair with arm rests and mod cues, pt able to stand again after seated break with CGA.    Ambulation/Gait Ambulation/Gait assistance: Contact guard assist Gait Distance (Feet): 40 Feet (2ft in room, seated break, 100ft) Assistive device: Rolling walker (2 wheels) Gait Pattern/deviations: Step-to pattern, Step-through pattern, Decreased stride length, Trunk flexed, Antalgic, Decreased stance time - left, Decreased step length - right, Decreased weight shift to left   Gait velocity interpretation: <1.31 ft/sec, indicative of household ambulator   General Gait Details: Initially pt BP noted to be orthostatic and pt symptomatic, so only 30ft in room first trial. After seated rest break and for RW to be lowered 1 click, pt able to perform additional  trial, with slightly less dizziness, RN/MD notified of pt orthostatic hypotension and pt reports he sometimes wears BLE compression socks for ambulation to get  to/from OP HD appointments, MD notified, no compression socks in room at this time to trial. Pt encouraged to have spouse bring them in for him if she can, otherwise may need TED hose order (knee high). Mod cues for posture, forward gaze, pt compliant with foot placement throughout and careful to avoid hip abduction actively.   Stairs             Wheelchair Mobility     Tilt Bed    Modified Rankin (Stroke Patients Only)       Balance Overall balance assessment: Needs assistance Sitting-balance support: Feet supported Sitting balance-Leahy Scale: Fair Sitting balance - Comments: needs UE support for dynamic seated tasks (see LE HEP)   Standing balance support: Bilateral upper extremity supported Standing balance-Leahy Scale: Poor Standing balance comment: dependent on RW and external support                            Communication Communication Communication: Impaired Factors Affecting Communication: Hearing impaired  Cognition Arousal: Alert Behavior During Therapy: WFL for tasks assessed/performed   PT - Cognitive impairments: Safety/Judgement, Sequencing                       PT - Cognition Comments: Pt with better awareness of LLE no active hip abduction precs and WBAT this session. Pt diaphoretic initially sitting EOB and when PTA asked about symptoms, he reports dizziness when standing, does not resolve after 1 minute standing, noted to be orthostatic, RN/MD notified in case he can get BLE compression socks. Following commands: Intact      Cueing Cueing Techniques: Verbal cues, Gestural cues  Exercises General Exercises - Lower Extremity Ankle Circles/Pumps: AROM, Both, 15 reps, Seated Long Arc Quad: AROM, 10 reps, Seated, Both (posterior lean, pt needs BUE support to perform) Other Exercises Other Exercises: IS x 10 reps, pt achieves >1500 mL    General Comments General comments (skin integrity, edema, etc.): Encouraged IS use hourly  x10 reps (pt stated they told me to do 10 reps daily).      Pertinent Vitals/Pain Pain Assessment Pain Assessment: 0-10 Pain Score: 3  Pain Location: 3-4 on LLE Pain Descriptors / Indicators: Discomfort, Guarding, Operative site guarding Pain Intervention(s): Limited activity within patient's tolerance, Monitored during session, Premedicated before session, Repositioned, Ice applied    Home Living                          Prior Function            PT Goals (current goals can now be found in the care plan section) Acute Rehab PT Goals Patient Stated Goal: Get home, and have transportation to HD Progress towards PT goals: Progressing toward goals    Frequency    Min 2X/week      PT Plan      Co-evaluation              AM-PAC PT 6 Clicks Mobility   Outcome Measure  Help needed turning from your back to your side while in a flat bed without using bedrails?: A Lot Help needed moving from lying on your back to sitting on the side of a flat bed without using bedrails?: A Little Help needed moving to and  from a bed to a chair (including a wheelchair)?: A Little Help needed standing up from a chair using your arms (e.g., wheelchair or bedside chair)?: A Little Help needed to walk in hospital room?: A Little Help needed climbing 3-5 steps with a railing? : Total 6 Click Score: 15    End of Session Equipment Utilized During Treatment: Gait belt Activity Tolerance: Patient tolerated treatment well Patient left: in chair;with call bell/phone within reach;with chair alarm set;Other (comment) (recliner; ice pack under his L thigh/above knee per his request) Nurse Communication: Mobility status;Other (comment) (MD/RN notified pt orthostatic hypotension with mild symptoms) PT Visit Diagnosis: Other abnormalities of gait and mobility (R26.89);Pain Pain - Right/Left: Left Pain - part of body: Hip;Leg     Time: 8688-8655 PT Time Calculation (min) (ACUTE  ONLY): 33 min  Charges:    $Gait Training: 8-22 mins $Therapeutic Exercise: 8-22 mins PT General Charges $$ ACUTE PT VISIT: 1 Visit                     Thamara Leger P., PTA Acute Rehabilitation Services Secure Chat Preferred 9a-5:30pm Office: [phone]    Connell CHRISTELLA Blue 04/02/2024, 1:59 PM

## 2024-04-02 NOTE — Progress Notes (Signed)
 " [name] Progress Note   Subjective:    Seen and examined patient at bedside. Noted he tolerated HD in the chair on Saturday without any issues. Plan for him to be transferred to [facility] MWF while he's in SNF. Awaiting approval to [facility] Plan for HD this afternoon.  Objective Vitals:   04/01/24 1328 04/01/24 1928 04/02/24 0311 04/02/24 0738  BP: 127/71 127/77 131/79 135/74  Pulse: (!) 58 60 (!) 59 (!) 59  Resp:  19 17   Temp: (!) 97.5 F (36.4 C) 97.6 F (36.4 C) 98.4 F (36.9 C) 97.9 F (36.6 C)  TempSrc: Oral   Oral  SpO2: 98% 100% 97% 97%  Weight:      Height:       Physical Exam General: Awake, alert, NAD Heart: S1 and S2; No murmurs, gallops, or rubs Lungs: Clear throughout Abdomen: Soft and non-tender Extremities: No LE edema Dialysis Access: R AVF   Filed Weights   03/31/24 0500 03/31/24 1324 03/31/24 1639  Weight: 86 kg 86 kg 85 kg    Intake/Output Summary (Last 24 hours) at 04/02/2024 1147 Last data filed at 04/02/2024 0933 Gross per 24 hour  Intake 243 ml  Output 820 ml  Net -577 ml    Additional Objective Labs: Basic Metabolic Panel: Recent Labs  Lab 03/30/24 0545 03/31/24 1129 04/01/24 0703  NA 136 134* 133*  K 5.0 4.7 4.6  CL 94* 94* 93*  CO2 26 27 30   GLUCOSE 82 138* 96  BUN 92* 71* 51*  CREATININE 9.38* 7.30* 6.30*  CALCIUM 10.7* 10.6* 10.4*  PHOS  --  5.5*  --    Liver Function Tests: Recent Labs  Lab 03/31/24 1129  ALBUMIN  3.6   No results for input(s): LIPASE, AMYLASE in the last 168 hours. CBC: Recent Labs  Lab 03/27/24 0607 03/28/24 0503 03/29/24 0704 03/31/24 1129 04/01/24 0703  WBC 7.5 11.7* 7.9 7.4 7.8  HGB 14.7 13.6 13.3 13.7 13.7  HCT 44.8 40.2 39.9 40.7 40.7  MCV 94.1 91.4 91.5 90.4 90.6  PLT 225 242 236 283 283   Blood Culture    Component Value Date/Time   SDES EXPECTORATED SPUTUM 03/26/2024 2356   SDES EXPECTORATED SPUTUM 03/26/2024 2356   SPECREQUEST NONE  03/26/2024 2356   SPECREQUEST NONE Reflexed from [id number] 03/26/2024 2356   CULT ABUNDANT STREPTOCOCCUS PNEUMONIAE 03/26/2024 2356   REPTSTATUS 03/27/2024 FINAL 03/26/2024 2356   REPTSTATUS 03/30/2024 FINAL 03/26/2024 2356    Cardiac Enzymes: No results for input(s): CKTOTAL, CKMB, CKMBINDEX, TROPONINI in the last 168 hours. CBG: Recent Labs  Lab 03/31/24 0804  GLUCAP 132*   Iron Studies: No results for input(s): IRON, TIBC, TRANSFERRIN, FERRITIN in the last 72 hours. Lab Results  Component Value Date   INR 1.1 03/26/2024   INR 1.1 03/25/2024   Studies/Results: No results found.  Medications:   acetaminophen   1,000 mg Oral Q8H   amiodarone   200 mg Oral Daily   apixaban   5 mg Oral BID   Chlorhexidine  Gluconate Cloth  6 each Topical Q0600   Chlorhexidine  Gluconate Cloth  6 each Topical Q0600   Chlorhexidine  Gluconate Cloth  6 each Topical Q0600   cinacalcet   30 mg Oral Q M,W,F-HD   pantoprazole   40 mg Oral Daily   PARoxetine   40 mg Oral Daily   pneumococcal 20-valent conjugate vaccine  0.5 mL Intramuscular Tomorrow-1000   senna-docusate  1 tablet Oral QHS   sodium chloride  flush  3 mL Intravenous Q12H  sodium chloride  flush  3 mL Intravenous Q12H   sucroferric oxyhydroxide  500 mg Oral TID WC   tamsulosin   0.4 mg Oral Daily    Dialysis Orders: [facility] 4[facility]  b400  79kg  2K bath  AVF  Hep 2000 Last HD 1/03, post wt 79.6kg Gets close to dry weight on most/almost all treatments  Home bp meds: No bp lowering meds  Assessment/Plan: # ESRD - on HD TTS - had HD Sat  - Sat in chair for HD Saturday w/o issues - plan HD this afternoon since new OP HD unit will  be MWF     # BP - Is not on BP lowering meds at home - follow blood pressure     # Volume - No volume excess on exam - up by wts, no signs of vol overload     # 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

## 2024-04-02 NOTE — Plan of Care (Signed)
   Problem: Education: Goal: Knowledge of General Education information will improve Description Including pain rating scale, medication(s)/side effects and non-pharmacologic comfort measures Outcome: Progressing   Problem: Health Behavior/Discharge Planning: Goal: Ability to manage health-related needs will improve Outcome: Progressing

## 2024-04-02 NOTE — Progress Notes (Addendum)
 Notes reviewed from the weekend. Per notes, pt had HD in chair on Saturday and was able to tolerate. Contacted [facility] admissions and [facility] this morning to be made aware of this info and to request approval for pt so pt can d/c to snf. Awaiting response from [facility]. Update provided to CSW. Will assist as needed.   Randine Mungo Dialysis Navigator [phone]  Addendum at 11:53 am: Pt has been accepted at [facility] on MWF 12:05 pm chair time. Pt can start on Wed and will need to arrive at 11:30 am for paperwork prior to treatment. This info was provided to nephrologist, renal NP, and CSW. Will await confirmation from CSW that snf is able to accommodate this clinic and schedule.

## 2024-04-02 NOTE — TOC Progression Note (Addendum)
 Transition of Care [facility]) - Progression Note    Patient Details  Name: Christopher [name] MRN: [id number] Date of Birth: [birth year]  Transition of Care [facility] CM/SW Contact  Bridget Cordella Simmonds, LCSW Phone Number: 04/02/2024, 3:18 PM  Clinical Narrative:   Message left with [name] to confirm they can accept HD at [facility] MWF 1205pm chair time.  1515: CSW spoke with [name], who confirmed that time works.  Renal navigator informed.   SNF auth request submitted in [place] and approved: [id number], 3 days: 1/13-1/15.    Expected Discharge Plan: Skilled Nursing Facility Barriers to Discharge: Continued Medical Work up, SNF Pending bed offer               Expected Discharge Plan and Services In-house Referral: Clinical Social Work   Post Acute Care Choice: Skilled Nursing Facility Living arrangements for the past 2 months: Single Family Home                                       Social Drivers of Health (SDOH) Interventions SDOH Screenings   Food Insecurity: No Food Insecurity (03/25/2024)  Housing: Low Risk (03/25/2024)  Transportation Needs: No Transportation Needs (03/25/2024)  Utilities: Not At Risk (03/25/2024)  Alcohol Screen: Low Risk (12/14/2021)  Depression (PHQ2-9): Low Risk (12/14/2021)  Financial Resource Strain: Low Risk (12/14/2021)  Physical Activity: Inactive (12/14/2021)  Social Connections: Moderately Isolated (03/25/2024)  Stress: No Stress Concern Present (12/14/2021)  Tobacco Use: Medium Risk (03/27/2024)    Readmission Risk Interventions     No data to display

## 2024-04-02 NOTE — Progress Notes (Signed)
 " PROGRESS NOTE  Christopher [name] FMW:[id number] DOB: 07/22/1950 DOA: 03/25/2024 PCP: Purcell Emil Schanz, MD   LOS: 8 days   Brief narrative:  Christopher [name] is a  74 y.o male with history of ESRD (TTS), A-fib (on Eliquis ), HTN, anxiety, depression, bladder cancer, presented to hospital with dizziness after dialysis and had a mechanical fall with left hip pain.  Did not lose consciousness.  In the ED patient was slightly bradycardic.  Labs were normal except for creatinine elevation at 6.0.  X-ray of the left hip showed minimally displaced  greater trochanteric fracture of proximal left femur.  CT abdomen and pelvis showed minimally displaced fracture of the greater trochanter on the left.  Orthopedics was consulted and patient was admitted to the hospital for further evaluation and treatment.   Assessment/Plan: Principal Problem:   Closed left hip fracture, initial encounter [facility]) Active Problems:   Primary hypertension   Depression   Controlled atrial fibrillation (HCC)   Transitional cell carcinoma of bladder 2007   GERD (gastroesophageal reflux disease)   ESRD (end stage renal disease) (HCC)  Closed left hip fracture, initial encounter Status post mechanical fall.  X-ray and CT scan with minimally displaced greater trochanter fracture.MRI of the hip was performed and patient underwent IM nailing of the left hip on 03/27/2024 by Dr. Celena,  Orthopedics.  PT OT has seen the patient  and recommend skilled nursing facility placement.  Medically stable for disposition at this time.  Mild hyperkalemia.  Adjusted during hemodialysis.  Latest potassium of 4.6    persistent atrial fibrillation Rate well-controlled on amiodarone , continue Eliquis  for anticoagulation..  Review of 2D echocardiogram from April 2023 with LV ejection fraction of 60 to 65%.  Depression Continue paroxetine .   Primary hypertension Fluctuates with dialysis.  Continue to monitor during hospitalization.     Transitional cell carcinoma of bladder 2007 In remission.   ESRD (end stage renal disease)  On hemodialysis-Tuesdays, Thursdays, Saturdays.  Nephrology on board for hemodialysis needs.    Bilateral foot tingling.  Might be neuropathy.  Supportive care.   DVT prophylaxis: SCDs Start: 03/25/24 1415 apixaban  (ELIQUIS ) tablet 5 mg Will restart Eliquis   Disposition: Skilled nursing facility as per PT evaluation.  Medically stable for disposition.  Status is: Inpatient Remains inpatient appropriate because: Need for rehabilitation.    Code Status:     Code Status: Full Code  Family Communication: Spoke with patient's sister on 03/30/2024.  Consultants: Orthopedics Nephrology  Procedures: IM nailing of the left hip on 03/27/2024 by Dr. Celena orthopedics Hemodialysis  Anti-infectives: none  Subjective: Today, patient was seen and examined at bedside.  Patient feels okay.  Denies shortness of breath dyspnea overt pain nausea vomiting.    Objective: Vitals:   04/02/24 0311 04/02/24 0738  BP: 131/79 135/74  Pulse: (!) 59 (!) 59  Resp: 17   Temp: 98.4 F (36.9 C) 97.9 F (36.6 C)  SpO2: 97% 97%    Intake/Output Summary (Last 24 hours) at 04/02/2024 0959 Last data filed at 04/02/2024 0933 Gross per 24 hour  Intake 3 ml  Output 720 ml  Net -717 ml   Filed Weights   03/31/24 0500 03/31/24 1324 03/31/24 1639  Weight: 86 kg 86 kg 85 kg   Body mass index is 27.67 kg/m (pended).   Physical Exam:  General:  Average built, not in obvious distress, Communicative. HENT:   No scleral pallor or icterus noted. Oral mucosa is moist.  Chest:  Clear breath sounds.  Diminished breath sounds bilaterally. No crackles or wheezes.  CVS: S1 &S2 heard. No murmur.  Regular rate and rhythm. Abdomen: Soft, nontender, nondistended.  Bowel sounds are heard.   Extremities: No cyanosis, clubbing or edema.  Peripheral pulses are palpable.  Right upper extremity AV fistula in place, Left hip  surgical site looks healthy. Psych: Alert, awake and oriented, normal mood CNS:  No cranial nerve deficits.  Moves all extremities. Skin: Warm and dry.  No rashes noted.  Left hip surgical scar.   Data Review: I have personally reviewed the following laboratory data and studies,  CBC: Recent Labs  Lab 03/27/24 0607 03/28/24 0503 03/29/24 0704 03/31/24 1129 04/01/24 0703  WBC 7.5 11.7* 7.9 7.4 7.8  HGB 14.7 13.6 13.3 13.7 13.7  HCT 44.8 40.2 39.9 40.7 40.7  MCV 94.1 91.4 91.5 90.4 90.6  PLT 225 242 236 283 283   Basic Metabolic Panel: Recent Labs  Lab 03/28/24 0503 03/29/24 0704 03/30/24 0545 03/31/24 1129 04/01/24 0703  NA 135 135 136 134* 133*  K 4.7 4.6 5.0 4.7 4.6  CL 95* 94* 94* 94* 93*  CO2 27 27 26 27 30   GLUCOSE 126* 93 82 138* 96  BUN 56* 78* 92* 71* 51*  CREATININE 7.01* 8.58* 9.38* 7.30* 6.30*  CALCIUM 10.0 10.2 10.7* 10.6* 10.4*  PHOS  --   --   --  5.5*  --    Liver Function Tests: Recent Labs  Lab 03/31/24 1129  ALBUMIN  3.6   No results for input(s): LIPASE, AMYLASE in the last 168 hours. No results for input(s): AMMONIA in the last 168 hours. Cardiac Enzymes: No results for input(s): CKTOTAL, CKMB, CKMBINDEX, TROPONINI in the last 168 hours. BNP (last 3 results) No results for input(s): BNP in the last 8760 hours.  ProBNP (last 3 results) No results for input(s): PROBNP in the last 8760 hours.  CBG: Recent Labs  Lab 03/31/24 0804  GLUCAP 132*   Recent Results (from the past 240 hours)  MRSA Next Gen by PCR, Nasal     Status: None   Collection Time: 03/25/24  3:36 AM   Specimen: Nasal Mucosa; Nasal Swab  Result Value Ref Range Status   MRSA by PCR Next Gen NOT DETECTED NOT DETECTED Final    Comment: (NOTE) The GeneXpert MRSA Assay (FDA approved for NASAL specimens only), is one component of a comprehensive MRSA colonization surveillance program. It is not intended to diagnose MRSA infection nor to guide or monitor  treatment for MRSA infections. Test performance is not FDA approved in patients less than 53 years old. Performed at [facility], [address] [place], [place] [address]   Expectorated Sputum Assessment w Gram Stain, Rflx to Resp Cult     Status: None   Collection Time: 03/26/24 11:56 PM   Specimen: Expectorated Sputum  Result Value Ref Range Status   Specimen Description EXPECTORATED SPUTUM  Final   Special Requests NONE  Final   Sputum evaluation   Final    THIS SPECIMEN IS ACCEPTABLE FOR SPUTUM CULTURE Performed at [facility], [address], [place], [place] [address]    Report Status 03/27/2024 FINAL  Final  Culture, Respiratory w Gram Stain     Status: None   Collection Time: 03/26/24 11:56 PM  Result Value Ref Range Status   Specimen Description EXPECTORATED SPUTUM  Final   Special Requests NONE Reflexed from [id number]  Final   Gram Stain   Final  MODERATE WBC PRESENT, PREDOMINANTLY PMN MODERATE GRAM POSITIVE COCCI Performed at [facility], [address], [place], [place] [address]    Culture ABUNDANT STREPTOCOCCUS PNEUMONIAE  Final   Report Status 03/30/2024 FINAL  Final   Organism ID, Bacteria STREPTOCOCCUS PNEUMONIAE  Final      Susceptibility   Streptococcus pneumoniae - MIC*    ERYTHROMYCIN <=0.12 SENSITIVE Sensitive     LEVOFLOXACIN 1 SENSITIVE Sensitive     VANCOMYCIN 0.5 SENSITIVE Sensitive     PENICILLIN (meningitis) <=0.06 SENSITIVE Sensitive     PENO - penicillin <=0.06      PENICILLIN (non-meningitis) <=0.06 SENSITIVE Sensitive     PENICILLIN (oral) <=0.06 SENSITIVE Sensitive     CEFTRIAXONE (non-meningitis) <=0.12 SENSITIVE Sensitive     CEFTRIAXONE (meningitis) <=0.12 SENSITIVE Sensitive     * ABUNDANT STREPTOCOCCUS PNEUMONIAE     Studies: No results found.     Savita Runner, MD  [facility] 04/02/2024  If 7PM-7AM, please contact night-coverage   "

## 2024-04-02 NOTE — Plan of Care (Signed)

## 2024-04-03 MED ORDER — LIDOCAINE HCL (PF) 1 % IJ SOLN: 5.0000 mL | INTRAMUSCULAR | Status: DC | PRN

## 2024-04-03 MED ORDER — HEPARIN SODIUM (PORCINE) 1000 UNIT/ML DIALYSIS: 1000.0000 [IU] | INTRAMUSCULAR | Status: DC | PRN

## 2024-04-03 MED ORDER — OXYCODONE HCL 5 MG PO TABS: 5.0000 mg | ORAL_TABLET | Freq: Four times a day (QID) | ORAL | 0 refills | Status: AC | PRN

## 2024-04-03 MED ORDER — HEPARIN SODIUM (PORCINE) 1000 UNIT/ML DIALYSIS: 2000.0000 [IU] | Freq: Once | INTRAMUSCULAR | Status: DC

## 2024-04-03 MED ORDER — SENNOSIDES-DOCUSATE SODIUM 8.6-50 MG PO TABS: 1.0000 | ORAL_TABLET | Freq: Every day | ORAL | Status: AC

## 2024-04-03 MED ORDER — ANTICOAGULANT SODIUM CITRATE 4% (200MG/5ML) IV SOLN: 5.0000 mL | Status: DC | PRN

## 2024-04-03 MED ORDER — ONDANSETRON HCL 4 MG PO TABS: 4.0000 mg | ORAL_TABLET | Freq: Four times a day (QID) | ORAL | Status: AC | PRN

## 2024-04-03 MED ORDER — HEPARIN SODIUM (PORCINE) 1000 UNIT/ML IJ SOLN
INTRAMUSCULAR | Status: AC
  Filled 2024-04-03: qty 3

## 2024-04-03 MED ORDER — PENTAFLUOROPROP-TETRAFLUOROETH EX AERO
INHALATION_SPRAY | CUTANEOUS | Status: AC
  Filled 2024-04-03: qty 30

## 2024-04-03 MED ORDER — LIDOCAINE-PRILOCAINE 2.5-2.5 % EX CREA: 1.0000 | TOPICAL_CREAM | CUTANEOUS | Status: DC | PRN

## 2024-04-03 MED ORDER — PENTAFLUOROPROP-TETRAFLUOROETH EX AERO: 1.0000 | INHALATION_SPRAY | CUTANEOUS | Status: DC | PRN

## 2024-04-03 MED ORDER — ALTEPLASE 2 MG IJ SOLR: 2.0000 mg | Freq: Once | INTRAMUSCULAR | Status: DC | PRN

## 2024-04-03 NOTE — Plan of Care (Signed)
" °  Problem: Skin Integrity: Goal: Demonstrates signs of wound healing without infection Outcome: Progressing   Problem: Bowel/Gastric: Goal: Gastrointestinal status for postoperative course will improve Outcome: Progressing   Problem: Education: Goal: Knowledge of the prescribed therapeutic regimen will improve Outcome: Progressing   "

## 2024-04-03 NOTE — Discharge Planning (Signed)
 [facility] Patient Discharge Orders- [facility] CLINIC: [place]  Patient's name: Christopher [name] Admit/DC Dates: 03/25/2024 - Anticipate discharge 04/04/23  Discharge to SNF  Discharge Diagnoses: Closed left hip fracture - S/p IM nailing of the left hip on 03/27/2024 by Dr. Celena   Afib - On Amiodarone  and Eliquis   Aranesp : Given: No     Last Hgb: 13.7 PRBC's Given: No ESA dose for discharge: N/A IV Iron dose at discharge: N/A  Heparin  change: No  EDW Change: Yes New EDW: Raise to 80kg. Bps were dropping with HD.  Bath Change: No  Access intervention/Change: No  Hectorol change: No  Discharge Labs: Calcium 10.4  Phosphorus 5.5 (from 03/31/24)  Albumin  3.6 (from 03/31/24)  K+ 4.6  IV Antibiotics: No  On Coumadin?: No, on Eliquis    OTHER/APPTS/LAB ORDERS:    D/C Meds to be reconciled by nurse after every discharge.  Completed By: Charmaine Piety, NP   Reviewed by: MD:______ RN_______

## 2024-04-03 NOTE — Discharge Summary (Signed)
 "  Physician Discharge Summary  Reason Helzer FMW:[id number] DOB: 11-Sep-1950 DOA: 03/25/2024  PCP: Purcell Emil Schanz, MD  Admit date: 03/25/2024 Discharge date: 04/03/2024  Admitted From: Home  Discharge disposition: Skilled nursing facility   Recommendations for Outpatient Follow-Up:   Follow up with your primary care provider in one week.  Check CBC, BMP, magnesium in the next visit Follow-up with orthopedics as outpatient in 1 week for postoperative checkup. Continue hemodialysis as outpatient.   Discharge Diagnosis:   Principal Problem:   Closed left hip fracture, initial encounter [facility]) Active Problems:   Primary hypertension   Depression   Controlled atrial fibrillation (HCC)   Transitional cell carcinoma of bladder 2007   GERD (gastroesophageal reflux disease)   ESRD (end stage renal disease) (HCC)   Discharge Condition: Improved.  Diet recommendation: Renal diet  Wound care: None.  Code status: Full.   History of Present Illness:   Christopher [name] is a  74 y.o male with history of ESRD (TTS), A-fib (on Eliquis ), HTN, anxiety, depression, bladder cancer, presented to hospital with dizziness after dialysis and had a mechanical fall with left hip pain.  Did not lose consciousness.  In the ED, patient was slightly bradycardic.  Labs were normal except for creatinine elevation at 6.0.  X-ray of the left hip showed minimally displaced  greater trochanteric fracture of proximal left femur.  CT abdomen and pelvis showed minimally displaced fracture of the greater trochanter on the left.  Orthopedics was consulted and patient was admitted to the hospital for further evaluation and treatment.   Hospital Course:   Following conditions were addressed during hospitalization as listed below,  Closed left hip fracture, initial encounter Status post mechanical fall.  X-ray and CT scan with minimally displaced greater trochanter fracture.MRI of the hip was performed  and patient underwent IM nailing of the left hip on 03/27/2024 by Dr. Celena,  Orthopedics.  PT OT has seen the patient  and recommend skilled nursing facility placement.  Medically stable for disposition at this time..  Will need to follow-up with orthopedics as outpatient.    persistent atrial fibrillation Rate well-controlled on amiodarone , continue Eliquis  for anticoagulation..  Review of 2D echocardiogram from April 2023 with LV ejection fraction of 60 to 65%.   Depression Continue paroxetine .   Primary hypertension Fluctuates with dialysis.  Can go down during dialysis.   Transitional cell carcinoma of bladder 2007 In remission.   ESRD (end stage renal disease)  Received hemodialysis.  Will resume Monday Wednesday Friday schedule on discharge.  Nephrology  followed the patient during hospitalization.  Disposition.  At this time, patient is stable for disposition to skilled nursing facility with outpatient orthopedics follow-up  Medical Consultants:   Orthopedics Nephrology  Procedures:    IM nailing of the left hip on 03/27/2024 by Dr. Celena orthopedics Hemodialysis Subjective:   Today, patient and examined during hemodialysis.  Complains of mild nausea.  Denies overt pain  vomiting fever chills or rigor.  Discharge Exam:   Vitals:   04/03/24 0944 04/03/24 1000  BP: (!) 75/60 (!) 78/57  Pulse: 69 65  Resp: 16 17  Temp:    SpO2: 96% 97%   Vitals:   04/03/24 0900 04/03/24 0930 04/03/24 0944 04/03/24 1000  BP: 107/68 (!) 81/57 (!) 75/60 (!) 78/57  Pulse: 65 67 69 65  Resp: 13 17 16 17   Temp:      TempSrc:      SpO2: 97% 97% 96% 97%  Weight:  Height:       Body mass index is 26.6 kg/m (pended).  General: Alert awake, not in obvious distress HENT: pupils equally reacting to light,  No scleral pallor or icterus noted. Oral mucosa is moist.  Chest: Diminished breath sounds bilaterally. CVS: S1 &S2 heard. No murmur.  Regular rate and rhythm. Abdomen: Soft,  nontender, nondistended.  Bowel sounds are heard.   Extremities: No cyanosis, clubbing or edema.  Peripheral pulses are palpable.  Left surgical site looks healthy.  Right AV fistula in place. Psych: Alert, awake and oriented, normal mood CNS:  No cranial nerve deficits.  Power equal in all extremities.   Skin: Warm and dry.  No rashes noted.  Left hip surgical scar.  The results of significant diagnostics from this hospitalization (including imaging, microbiology, ancillary and laboratory) are listed below for reference.     Diagnostic Studies:   MR HIP LEFT WO CONTRAST Result Date: 03/26/2024 EXAM: MRI of the left hip without contrast. 03/26/2024 12:23:00 PM TECHNIQUE: Multiplanar multisequence MRI of the left hip was performed without the administration of intravenous contrast. COMPARISON: None available. CLINICAL HISTORY: Hip trauma, fracture suspected, X-ray done; evaluation for nondisplaced femoral neck fracture. FINDINGS: BONE MARROW: Nondisplaced intertrochanteric fracture of the left hip with a more dominant greater trochanteric component but with abnormal band of edema signal extending down towards the lesser trochanter as on image 9 series 8 and image 78 series 1. Adjacent edema tracks within and along the left gluteus medius and gluteus minimus musculature and within and along the left quadratus femoris muscle. Lesser degree of edema tracks along the remainder of the left hip adductor musculature as well as the distal left iliopsoas. HIP JOINT: Moderate degenerative chondral thinning in both hips. No significant joint effusion. No subchondral cysts. No significant degenerative disease. LABRUM: No gross labral tear observed. No paralabral cyst. BURSAE: No significant iliopsoas or trochanteric bursitis. SCIATIC NERVE: Some of the edema tracts along the anterior margin of the left sciatic nerve particularly posterior to the quadratus femoris. MUSCLES AND TENDONS: No gluteal tendon tear. Intact  hamstrings tendon origin. No significant muscle atrophy. Edema tracks within and along the left gluteus medius and gluteus minimus musculature, within and along the left quadratus femoris muscle, along the remainder of the left hip adductor musculature, and along the distal left iliopsoas, related to the intertrochanteric fracture. SOFT TISSUES: No focal soft tissue mass. INTRAPELVIC CONTENTS: Mild sigmoid colon diverticulosis. Benign prostatic hypertrophy. IMPRESSION: 1. Nondisplaced intertrochanteric fracture of the left hip. Surrounding muscular edema as noted above. 2. Moderate degenerative chondral thinning of both hips. 3. Chronic incidental findings including mild sigmoid diverticulosis and benign prostatic hypertrophy. Electronically signed by: Ryan Salvage MD 03/26/2024 12:34 PM EST RP Workstation: [id number]   CT PELVIS WO CONTRAST Result Date: 03/25/2024 CLINICAL DATA:  Hip trauma, fracture suspected. EXAM: CT PELVIS WITHOUT CONTRAST TECHNIQUE: Multidetector CT imaging of the pelvis was performed following the standard protocol without intravenous contrast. RADIATION DOSE REDUCTION: This exam was performed according to the departmental dose-optimization program which includes automated exposure control, adjustment of the mA and/or kV according to patient size and/or use of iterative reconstruction technique. COMPARISON:  03/25/2024. FINDINGS: Urinary Tract:  No abnormality visualized. Bowel: Diverticulosis without diverticulitis. No bowel obstruction is seen. The visualized portion of the appendix appears normal. Vascular/Lymphatic: No pathologically enlarged lymph nodes. No significant vascular abnormality seen. Reproductive:  Prostate gland is enlarged. Other:  No pelvic ascites. Musculoskeletal: There is a minimally displaced comminuted fracture of the greater  trochanter. The remaining bony structures are intact. No dislocation is seen. There are mild degenerative changes at the hips bilaterally  and lower lumbar spine. Soft tissue swelling is present adjacent to the greater trochanter on the left, possible edema or hematoma. IMPRESSION: 1. Minimally displaced fracture of the greater trochanter on the left. 2. Diverticulosis without diverticulitis. 3. Enlarged prostate gland. Electronically Signed   By: Leita Birmingham M.D.   On: 03/25/2024 16:44   DG Chest 1 View Result Date: 03/25/2024 CLINICAL DATA:  Fall EXAM: CHEST  1 VIEW COMPARISON:  July 08, 2021 FINDINGS: Stable cardiomediastinal silhouette. Minimal right midlung subsegmental atelectasis or scarring is noted. Left lung is clear. Bony thorax is unremarkable. IMPRESSION: Minimal right midlung subsegmental atelectasis or scarring. Electronically Signed   By: Lynwood Landy Raddle M.D.   On: 03/25/2024 13:30   DG Hip Unilat With Pelvis 2-3 Views Left Result Date: 03/25/2024 CLINICAL DATA:  Left hip pain after fall EXAM: DG HIP (WITH OR WITHOUT PELVIS) 2-3V LEFT COMPARISON:  None Available. FINDINGS: Minimally displaced fracture is seen involving the greater trochanter of the proximal left femur. Mild narrowing of left hip is noted. IMPRESSION: Minimally displaced greater trochanteric fracture of proximal left femur. CT scan may be performed to evaluate for more extensive fracture than visualized. Electronically Signed   By: Lynwood Landy Raddle M.D.   On: 03/25/2024 13:28     Labs:   Basic Metabolic Panel: Recent Labs  Lab 03/28/24 0503 03/29/24 0704 03/30/24 0545 03/31/24 1129 04/01/24 0703  NA 135 135 136 134* 133*  K 4.7 4.6 5.0 4.7 4.6  CL 95* 94* 94* 94* 93*  CO2 27 27 26 27 30   GLUCOSE 126* 93 82 138* 96  BUN 56* 78* 92* 71* 51*  CREATININE 7.01* 8.58* 9.38* 7.30* 6.30*  CALCIUM 10.0 10.2 10.7* 10.6* 10.4*  PHOS  --   --   --  5.5*  --    GFR Estimated Creatinine Clearance: 10.4 mL/min (A) (by C-G formula based on SCr of 6.3 mg/dL (H)). Liver Function Tests: Recent Labs  Lab 03/31/24 1129  ALBUMIN  3.6   No results for  input(s): LIPASE, AMYLASE in the last 168 hours. No results for input(s): AMMONIA in the last 168 hours. Coagulation profile No results for input(s): INR, PROTIME in the last 168 hours.  CBC: Recent Labs  Lab 03/28/24 0503 03/29/24 0704 03/31/24 1129 04/01/24 0703  WBC 11.7* 7.9 7.4 7.8  HGB 13.6 13.3 13.7 13.7  HCT 40.2 39.9 40.7 40.7  MCV 91.4 91.5 90.4 90.6  PLT 242 236 283 283   Cardiac Enzymes: No results for input(s): CKTOTAL, CKMB, CKMBINDEX, TROPONINI in the last 168 hours. BNP: Invalid input(s): POCBNP CBG: Recent Labs  Lab 03/31/24 0804  GLUCAP 132*   D-Dimer No results for input(s): DDIMER in the last 72 hours. Hgb A1c No results for input(s): HGBA1C in the last 72 hours. Lipid Profile No results for input(s): CHOL, HDL, LDLCALC, TRIG, CHOLHDL, LDLDIRECT in the last 72 hours. Thyroid function studies No results for input(s): TSH, T4TOTAL, T3FREE, THYROIDAB in the last 72 hours.  Invalid input(s): FREET3 Anemia work up No results for input(s): VITAMINB12, FOLATE, FERRITIN, TIBC, IRON, RETICCTPCT in the last 72 hours. Microbiology Recent Results (from the past 240 hours)  MRSA Next Gen by PCR, Nasal     Status: None   Collection Time: 03/25/24  3:36 AM   Specimen: Nasal Mucosa; Nasal Swab  Result Value Ref Range Status   MRSA  by PCR Next Gen NOT DETECTED NOT DETECTED Final    Comment: (NOTE) The GeneXpert MRSA Assay (FDA approved for NASAL specimens only), is one component of a comprehensive MRSA colonization surveillance program. It is not intended to diagnose MRSA infection nor to guide or monitor treatment for MRSA infections. Test performance is not FDA approved in patients less than 58 years old. Performed at [facility], [address] [place], [place] [address]   Expectorated Sputum Assessment w Gram Stain, Rflx to Resp Cult     Status: None   Collection Time: 03/26/24 11:56 PM    Specimen: Expectorated Sputum  Result Value Ref Range Status   Specimen Description EXPECTORATED SPUTUM  Final   Special Requests NONE  Final   Sputum evaluation   Final    THIS SPECIMEN IS ACCEPTABLE FOR SPUTUM CULTURE Performed at [facility], [address], [place], [place] [address]    Report Status 03/27/2024 FINAL  Final  Culture, Respiratory w Gram Stain     Status: None   Collection Time: 03/26/24 11:56 PM  Result Value Ref Range Status   Specimen Description EXPECTORATED SPUTUM  Final   Special Requests NONE Reflexed from [id number]  Final   Gram Stain   Final    MODERATE WBC PRESENT, PREDOMINANTLY PMN MODERATE GRAM POSITIVE COCCI Performed at [facility], [address] [place], [place] [address]    Culture ABUNDANT STREPTOCOCCUS PNEUMONIAE  Final   Report Status 03/30/2024 FINAL  Final   Organism ID, Bacteria STREPTOCOCCUS PNEUMONIAE  Final      Susceptibility   Streptococcus pneumoniae - MIC*    ERYTHROMYCIN <=0.12 SENSITIVE Sensitive     LEVOFLOXACIN 1 SENSITIVE Sensitive     VANCOMYCIN 0.5 SENSITIVE Sensitive     PENICILLIN (meningitis) <=0.06 SENSITIVE Sensitive     PENO - penicillin <=0.06      PENICILLIN (non-meningitis) <=0.06 SENSITIVE Sensitive     PENICILLIN (oral) <=0.06 SENSITIVE Sensitive     CEFTRIAXONE (non-meningitis) <=0.12 SENSITIVE Sensitive     CEFTRIAXONE (meningitis) <=0.12 SENSITIVE Sensitive     * ABUNDANT STREPTOCOCCUS PNEUMONIAE     Discharge Instructions:   Discharge Instructions     Call MD for:  persistant nausea and vomiting   Complete by: As directed    Call MD for:  severe uncontrolled pain   Complete by: As directed    Call MD for:  temperature >100.4   Complete by: As directed    Diet renal with fluid restriction   Complete by: As directed    Discharge instructions   Complete by: As directed    Follow-up with your primary care provider at the skilled nursing facility in 3 to 5 days.  Check blood work at  that time.  Follow-up with orthopedics as outpatient in 1 weeks.  Seek medical attention for worsening symptoms.  Continue weightbearing as tolerated on the left leg.   Increase activity slowly   Complete by: As directed    No wound care   Complete by: As directed       Allergies as of 04/03/2024   No Known Allergies      Medication List     TAKE these medications    acetaminophen  500 MG tablet Commonly known as: TYLENOL  Take 1,000 mg by mouth as needed for mild pain (pain score 1-3) or moderate pain (pain score 4-6).   amiodarone  200 MG tablet Commonly known as: PACERONE  Take 1 tablet (200 mg total)  by mouth daily.   apixaban  5 MG Tabs tablet Commonly known as: Eliquis  Take 1 tablet (5 mg total) by mouth 2 (two) times daily.   FISH OIL PO Take 500 mg by mouth every morning.   multivitamin with minerals Tabs tablet Take 1 tablet by mouth daily.   ondansetron  4 MG tablet Commonly known as: ZOFRAN  Take 1 tablet (4 mg total) by mouth every 6 (six) hours as needed for nausea.   oxyCODONE  5 MG immediate release tablet Commonly known as: Oxy IR/ROXICODONE  Take 1 tablet (5 mg total) by mouth every 6 (six) hours as needed for moderate pain (pain score 4-6).   pantoprazole  40 MG tablet Commonly known as: PROTONIX  TAKE 1 TABLET(40 MG) BY MOUTH DAILY   PARoxetine  40 MG tablet Commonly known as: PAXIL  TAKE 1 TABLET BY MOUTH EVERY DAY   Renal-Vite 0.8 MG Tabs Take 1 tablet by mouth every morning.   senna-docusate 8.6-50 MG tablet Commonly known as: Senokot-S Take 1 tablet by mouth at bedtime.   Sensipar  30 MG tablet Generic drug: cinacalcet  Take 30 mg by mouth every Monday, Wednesday, and Friday with hemodialysis. Taking 1 tablet by mouth three times weekly with meals   tamsulosin  0.4 MG Caps capsule Commonly known as: FLOMAX  Take 0.4 mg by mouth daily.   Velphoro  500 MG chewable tablet Generic drug: sucroferric oxyhydroxide Chew 500 mg by mouth 3 (three) times  daily with meals.        Follow-up Information     Celena Sharper, MD. Schedule an appointment as soon as possible for a visit in 1 week(s).   Specialty: Orthopedic Surgery Why: post operative followup Contact information: [address] [place] [address] [phone]         [facility]. Go on 04/04/2024.   Why: Schedule is Monday, Wednesday, Friday wtih 12:05 pm start time.  On Wednesday, Jan 14, please arrive at 11:30 am for paperwork prior to treatment. Contact information: [address] [place] [address] [phone]                  Time coordinating discharge: 39 minutes  Signed:  Kyel Purk  [facility] 04/03/2024, 10:05 AM          "

## 2024-04-03 NOTE — Progress Notes (Signed)
 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 Had some low BP, UFG lowered, received some saline, BP better. Has a cough, chronic issue, cough drops usually help   Objective:   BP 105/64 (BP Location: Left Arm)   Pulse 73   Temp 97.9 F (36.6 C) (Oral)   Resp 16   Ht (P) 5' 9 (1.753 m)   Wt 81.7 kg   SpO2 98%   BMI (P) 26.60 kg/m   Intake/Output Summary (Last 24 hours) at 04/03/2024 1042 Last data filed at 04/03/2024 0300 Gross per 24 hour  Intake --  Output 150 ml  Net -150 ml   Weight change:   Physical Exam: Gen: NAD CVS: RRR Resp: CTA BL Abd: soft, nt/nd Ext: no edema Neuro: awake, alert Dialysis access: RUE AVF  Imaging: No results found.  Labs: BMET Recent Labs  Lab 03/28/24 0503 03/29/24 0704 03/30/24 0545 03/31/24 1129 04/01/24 0703  NA 135 135 136 134* 133*  K 4.7 4.6 5.0 4.7 4.6  CL 95* 94* 94* 94* 93*  CO2 27 27 26 27 30   GLUCOSE 126* 93 82 138* 96  BUN 56* 78* 92* 71* 51*  CREATININE 7.01* 8.58* 9.38* 7.30* 6.30*  CALCIUM 10.0 10.2 10.7* 10.6* 10.4*  PHOS  --   --   --  5.5*  --    CBC Recent Labs  Lab 03/28/24 0503 03/29/24 0704 03/31/24 1129 04/01/24 0703  WBC 11.7* 7.9 7.4 7.8  HGB 13.6 13.3 13.7 13.7  HCT  40.2 39.9 40.7 40.7  MCV 91.4 91.5 90.4 90.6  PLT 242 236 283 283    Medications:     acetaminophen   1,000 mg Oral Q8H   amiodarone   200 mg Oral Daily   apixaban   5 mg Oral BID   Chlorhexidine  Gluconate Cloth  6 each Topical Q0600   cinacalcet   30 mg Oral Q M,W,F-HD   heparin   2,000 Units Dialysis Once in dialysis   heparin   2,000 Units Dialysis Once in dialysis   pantoprazole   40 mg Oral Daily   PARoxetine   40 mg Oral Daily   pneumococcal 20-valent conjugate vaccine  0.5 mL Intramuscular Tomorrow-1000   senna-docusate  1 tablet Oral QHS   sodium chloride  flush  3 mL Intravenous Q12H   sodium chloride  flush  3 mL Intravenous Q12H   sucroferric oxyhydroxide  500 mg Oral TID WC   tamsulosin   0.4 mg Oral Daily      Ephriam Stank, MD [facility] 04/03/2024, 10:42 AM   "

## 2024-04-03 NOTE — Progress Notes (Signed)
" °   04/03/24 1146  Vitals  Temp 98.3 F (36.8 C)  Temp Source Oral  BP 103/62  MAP (mmHg) (!) 64  BP Location Left Arm  BP Method Automatic  Patient Position (if appropriate) Lying  Pulse Rate 64  Pulse Rate Source Monitor  ECG Heart Rate 64  Resp 16  Oxygen Therapy  SpO2 97 %  O2 Device Room Air  Patient Activity (if Appropriate) In bed  Pulse Oximetry Type Continuous  During Treatment Monitoring  Blood Flow Rate (mL/min) 399 mL/min  Arterial Pressure (mmHg) -152.32 mmHg  Venous Pressure (mmHg) 271.1 mmHg  TMP (mmHg) -14.14 mmHg  Ultrafiltration Rate (mL/min) 0 mL/min  Dialysate Flow Rate (mL/min) 299 ml/min  Duration of HD Treatment -hour(s) 3.47 hour(s)  Cumulative Fluid Removed (mL) per Treatment  446.69  HD Safety Checks Performed Yes  Intra-Hemodialysis Comments Tx completed;Tolerated well  Post Treatment  Dialyzer Clearance Heavily streaked  Liters Processed 83.9  Fluid Removed (mL) 400 mL  Tolerated HD Treatment Yes  AVG/AVF Arterial Site Held (minutes) 8 minutes  AVG/AVF Venous Site Held (minutes) 8 minutes  Fistula / Graft Right Forearm Arteriovenous fistula  Placement Date/Time: 07/08/21 1150   Placed prior to admission: No  Orientation: Right  Access Location: Forearm  Access Type: Arteriovenous fistula  Site Condition No complications  Fistula / Graft Assessment Present;Thrill;Bruit;Aneurysm present  Status Deaccessed  Drainage Description None    "

## 2024-04-03 NOTE — TOC Progression Note (Addendum)
 Transition of Care [facility]) - Progression Note    Patient Details  Name: Christopher [name] MRN: [id number] Date of Birth: 1951-02-08  Transition [facility] CM/SW Contact  Bridget Cordella Simmonds, LCSW Phone Number: 04/03/2024, 11:14 AM  Clinical Narrative:   CSW informed by [name][facility]: they have both covid and flu patients currently in the building. Pt at HD, CSW informed son Adina, who does not want to proceed with admission to [facility].  Matt interested in [facility], CSW spoke with [facility] and they need HD 9am or later, MWF preferable but could do TTS if necessary.  Tracy[facility] informed and will see what options are available.   Per Tracy[facility]: HD is available at [facility] MWF 1000.  CSW confirmed with [facility] that this works for them.  [facility] can receive pt today.   1330: CSW attempted to call pt son [name] twice regarding [facility], left message.  CSW spoke with pt and updated him, he asked about [facility], who could not accommodate the HD.  Pt is agreeable to [facility] after discussing this with CSW.  SNF auth facility switched to [facility].    Expected Discharge Plan: Skilled Nursing Facility Barriers to Discharge: Continued Medical Work up, SNF Pending bed offer               Expected Discharge Plan and Services In-house Referral: Clinical Social Work   Post Acute Care Choice: Skilled Nursing Facility Living arrangements for the past 2 months: Single Family Home Expected Discharge Date: 04/03/24                                     Social Drivers of Health (SDOH) Interventions SDOH Screenings   Food Insecurity: No Food Insecurity (03/25/2024)  Housing: Low Risk (03/25/2024)  Transportation Needs: No Transportation Needs (03/25/2024)  Utilities: Not At Risk (03/25/2024)  Alcohol Screen: Low Risk (12/14/2021)  Depression (PHQ2-9): Low Risk (12/14/2021)  Financial Resource Strain: Low Risk (12/14/2021)  Physical Activity: Inactive (12/14/2021)  Social Connections:  Moderately Isolated (03/25/2024)  Stress: No Stress Concern Present (12/14/2021)  Tobacco Use: Medium Risk (03/27/2024)    Readmission Risk Interventions     No data to display

## 2024-04-03 NOTE — Progress Notes (Addendum)
 Met with pt while pt receiving HD. Discussed out-pt HD arrangements while at snf at d/c. Schedule letter provided to pt as well. Pt has questions for CSW regarding snf. Navigator contacted CSW to be made aware that pt has questions regarding snf and CSW to f/u with pt once pt returns from HD. HD arrangements added to AVS as well. CSW has provided HD details to SNF. Navigator will confirm pt's d/c today with clinic once pt and CSW discuss pt's concerns/questions.   Randine Mungo Dialysis Navigator [phone]  Addendum at 1:49 pm: Contacted by CSW and advised that pt will no longer be d/c to [facility]. Pt will now be going to [facility] in [place]. SNF is requesting HD clinic in [place] on MWF 2nd shift if possible. SNF agreeable to consider TTS 2nd shift if no MWF 2nd shift available. Per CSW, snf is agreeable to transport pt to [facility] [place] (pt's normal clinic) if they have a 2nd shift appt available. Contacted [employer] and spoke to tax adviser. Clinic has a MWF 9:45 am arrival for 10:00 am chair time. This info was provided to CSW who provided this info to snf. CSW confirms that SNF can transport pt to [facility] [place] on MWF 9:45 am arrival for 10:00 am chair time starting tomorrow. HD appts added to AVS. Spoke to pt's son via phone to make him aware of pt's schedule change due to snf request/placement. Contacted nephrologist and renal NP to provide update regarding pt's clinic at d/c. Renal NP to send orders to clinic at d/c. Contacted clinic to be advised of plan for d/c today to snf and pt to resume at clinic tomorrow on new schedule.

## 2024-04-03 NOTE — Plan of Care (Signed)
" °  Problem: Education: Goal: Knowledge of General Education information will improve Description: Including pain rating scale, medication(s)/side effects and non-pharmacologic comfort measures Outcome: Adequate for Discharge   Problem: Health Behavior/Discharge Planning: Goal: Ability to manage health-related needs will improve Outcome: Adequate for Discharge   Problem: Clinical Measurements: Goal: Ability to maintain clinical measurements within normal limits will improve Outcome: Adequate for Discharge Goal: Will remain free from infection Outcome: Adequate for Discharge Goal: Diagnostic test results will improve Outcome: Adequate for Discharge Goal: Respiratory complications will improve Outcome: Adequate for Discharge Goal: Cardiovascular complication will be avoided Outcome: Adequate for Discharge   Problem: Activity: Goal: Risk for activity intolerance will decrease Outcome: Adequate for Discharge   Problem: Nutrition: Goal: Adequate nutrition will be maintained Outcome: Adequate for Discharge   Problem: Coping: Goal: Level of anxiety will decrease Outcome: Adequate for Discharge   Problem: Elimination: Goal: Will not experience complications related to bowel motility Outcome: Adequate for Discharge Goal: Will not experience complications related to urinary retention Outcome: Adequate for Discharge   Problem: Pain Managment: Goal: General experience of comfort will improve and/or be controlled Outcome: Adequate for Discharge   Problem: Safety: Goal: Ability to remain free from injury will improve Outcome: Adequate for Discharge   Problem: Skin Integrity: Goal: Risk for impaired skin integrity will decrease Outcome: Adequate for Discharge   Problem: Acute Rehab OT Goals (only OT should resolve) Goal: Pt. Will Perform Lower Body Bathing Outcome: Adequate for Discharge Goal: Pt. Will Perform Lower Body Dressing Outcome: Adequate for Discharge Goal: Pt. Will  Transfer To Toilet Outcome: Adequate for Discharge Goal: OT Additional ADL Goal #1 Outcome: Adequate for Discharge   Problem: Education: Goal: Knowledge of the prescribed therapeutic regimen will improve Outcome: Adequate for Discharge   Problem: Bowel/Gastric: Goal: Gastrointestinal status for postoperative course will improve Outcome: Adequate for Discharge   Problem: Cardiac: Goal: Ability to maintain an adequate cardiac output Outcome: Adequate for Discharge Goal: Will show no evidence of cardiac arrhythmias Outcome: Adequate for Discharge   Problem: Nutritional: Goal: Will attain and maintain optimal nutritional status Outcome: Adequate for Discharge   Problem: Neurological: Goal: Will regain or maintain usual level of consciousness Outcome: Adequate for Discharge   Problem: Clinical Measurements: Goal: Ability to maintain clinical measurements within normal limits Outcome: Adequate for Discharge Goal: Postoperative complications will be avoided or minimized Outcome: Adequate for Discharge   Problem: Respiratory: Goal: Will regain and/or maintain adequate ventilation Outcome: Adequate for Discharge Goal: Respiratory status will improve Outcome: Adequate for Discharge   Problem: Skin Integrity: Goal: Demonstrates signs of wound healing without infection Outcome: Adequate for Discharge   Problem: Urinary Elimination: Goal: Will remain free from infection Outcome: Adequate for Discharge Goal: Ability to achieve and maintain adequate urine output Outcome: Adequate for Discharge   Problem: Acute Rehab PT Goals(only PT should resolve) Goal: Pt Will Go Supine/Side To Sit Outcome: Adequate for Discharge Goal: Pt Will Go Sit To Supine/Side Outcome: Adequate for Discharge Goal: Patient Will Transfer Sit To/From Stand Outcome: Adequate for Discharge Goal: Pt Will Ambulate Outcome: Adequate for Discharge Goal: Pt Will Go Up/Down Stairs Outcome: Adequate for  Discharge Goal: Pt Will Verbalize and Adhere to Precautions While Description: PT Will Verbalize and Adhere to Precautions While Performing Mobility Outcome: Adequate for Discharge Goal: Pt/caregiver will Perform Home Exercise Program Outcome: Adequate for Discharge   "

## 2024-04-03 NOTE — TOC Transition Note (Signed)
 Transition of Care [facility]) - Discharge Note   Patient Details  Name: Christopher [name] MRN: [id number] Date of Birth: 07-May-1950  Transition of [facility] CM/SW Contact:  Bridget Cordella Simmonds, LCSW Phone Number: 04/03/2024, 2:12 PM   Clinical Narrative:   Pt discharging to [facility], room 407p. RN call report to [phone].  PTAR called [phone].     Final next level of care: Skilled Nursing Facility Barriers to Discharge: Barriers Resolved   Patient Goals and CMS Choice Patient states their goals for this hospitalization and ongoing recovery are:: back to normal          Discharge Placement              Patient chooses bed at: [facility] Patient to be transferred to facility by: ptar Name of family member notified: unable to reach wife or son but at least wife is aware Patient and family notified of of transfer: 04/03/24  Discharge Plan and Services Additional resources added to the After Visit Summary for   In-house Referral: Clinical Social Work   Post Acute Care Choice: Skilled Nursing Facility                               Social Drivers of Health (SDOH) Interventions SDOH Screenings   Food Insecurity: No Food Insecurity (03/25/2024)  Housing: Low Risk (03/25/2024)  Transportation Needs: No Transportation Needs (03/25/2024)  Utilities: Not At Risk (03/25/2024)  Alcohol Screen: Low Risk (12/14/2021)  Depression (PHQ2-9): Low Risk (12/14/2021)  Financial Resource Strain: Low Risk (12/14/2021)  Physical Activity: Inactive (12/14/2021)  Social Connections: Moderately Isolated (03/25/2024)  Stress: No Stress Concern Present (12/14/2021)  Tobacco Use: Medium Risk (03/27/2024)     Readmission Risk Interventions     No data to display

## 2024-04-03 NOTE — Procedures (Signed)
 I was present at this dialysis session. I have reviewed the session itself and made appropriate changes.   Filed Weights   03/31/24 1324 03/31/24 1639 04/03/24 0739  Weight: 86 kg 85 kg 81.7 kg    Recent Labs  Lab 03/31/24 1129 04/01/24 0703  NA 134* 133*  K 4.7 4.6  CL 94* 93*  CO2 27 30  GLUCOSE 138* 96  BUN 71* 51*  CREATININE 7.30* 6.30*  CALCIUM 10.6* 10.4*  PHOS 5.5*  --     Recent Labs  Lab 03/29/24 0704 03/31/24 1129 04/01/24 0703  WBC 7.9 7.4 7.8  HGB 13.3 13.7 13.7  HCT 39.9 40.7 40.7  MCV 91.5 90.4 90.6  PLT 236 283 283    Scheduled Meds:  acetaminophen   1,000 mg Oral Q8H   amiodarone   200 mg Oral Daily   apixaban   5 mg Oral BID   Chlorhexidine  Gluconate Cloth  6 each Topical Q0600   cinacalcet   30 mg Oral Q M,W,F-HD   heparin   2,000 Units Dialysis Once in dialysis   heparin   2,000 Units Dialysis Once in dialysis   pantoprazole   40 mg Oral Daily   PARoxetine   40 mg Oral Daily   pneumococcal 20-valent conjugate vaccine  0.5 mL Intramuscular Tomorrow-1000   senna-docusate  1 tablet Oral QHS   sodium chloride  flush  3 mL Intravenous Q12H   sodium chloride  flush  3 mL Intravenous Q12H   sucroferric oxyhydroxide  500 mg Oral TID WC   tamsulosin   0.4 mg Oral Daily   Continuous Infusions:  anticoagulant sodium citrate      PRN Meds:.acetaminophen  **OR** acetaminophen , alteplase , anticoagulant sodium citrate , bisacodyl , heparin , heparin , hydrALAZINE , HYDROmorphone  (DILAUDID ) injection, ipratropium, lidocaine  (PF), lidocaine -prilocaine , ondansetron  **OR** ondansetron  (ZOFRAN ) IV, oxyCODONE , pentafluoroprop-tetrafluoroeth, traZODone    Ephriam Stank, MD [facility] 04/03/2024, 10:39 AM

## 2024-04-23 ENCOUNTER — Telehealth: Payer: Self-pay

## 2024-04-26 ENCOUNTER — Inpatient Hospital Stay: Admitting: Emergency Medicine

## 2024-04-27 ENCOUNTER — Telehealth: Payer: Self-pay

## 2024-04-27 NOTE — Telephone Encounter (Signed)
 Please advise.

## 2024-04-27 NOTE — Telephone Encounter (Signed)
 Copied from CRM [id number]. Topic: Clinical - Home Health Verbal Orders >> Apr 27, 2024 10:06 AM Terri G wrote: Caller/Agency: [facility] FROM [facility] Number: [phone] Service Requested: Physical Therapy Frequency: 2X 1 , 3X 5 Any new concerns about the patient? No

## 2024-05-01 ENCOUNTER — Ambulatory Visit: Admitting: Family Medicine

## 2024-05-15 ENCOUNTER — Ambulatory Visit: Admitting: Cardiology
